# Patient Record
Sex: Female | Born: 1970
Health system: Southern US, Community
[De-identification: ages and names within clinical notes are randomized; demographics above are authoritative.]

## PROBLEM LIST (undated history)

## (undated) DIAGNOSIS — M1990 Unspecified osteoarthritis, unspecified site: Secondary | ICD-10-CM

## (undated) DIAGNOSIS — B009 Herpesviral infection, unspecified: Secondary | ICD-10-CM

## (undated) DIAGNOSIS — N921 Excessive and frequent menstruation with irregular cycle: Secondary | ICD-10-CM

## (undated) DIAGNOSIS — B9689 Other specified bacterial agents as the cause of diseases classified elsewhere: Secondary | ICD-10-CM

## (undated) DIAGNOSIS — M75100 Unspecified rotator cuff tear or rupture of unspecified shoulder, not specified as traumatic: Secondary | ICD-10-CM

## (undated) DIAGNOSIS — G479 Sleep disorder, unspecified: Secondary | ICD-10-CM

## (undated) DIAGNOSIS — E049 Nontoxic goiter, unspecified: Secondary | ICD-10-CM

## (undated) DIAGNOSIS — Q019 Encephalocele, unspecified: Secondary | ICD-10-CM

## (undated) DIAGNOSIS — M549 Dorsalgia, unspecified: Secondary | ICD-10-CM

## (undated) DIAGNOSIS — M069 Rheumatoid arthritis, unspecified: Secondary | ICD-10-CM

## (undated) DIAGNOSIS — N898 Other specified noninflammatory disorders of vagina: Secondary | ICD-10-CM

## (undated) DIAGNOSIS — K5090 Crohn's disease, unspecified, without complications: Secondary | ICD-10-CM

## (undated) DIAGNOSIS — R350 Frequency of micturition: Secondary | ICD-10-CM

## (undated) DIAGNOSIS — E669 Obesity, unspecified: Secondary | ICD-10-CM

## (undated) DIAGNOSIS — E041 Nontoxic single thyroid nodule: Secondary | ICD-10-CM

## (undated) DIAGNOSIS — N760 Acute vaginitis: Secondary | ICD-10-CM

## (undated) DIAGNOSIS — Z309 Encounter for contraceptive management, unspecified: Secondary | ICD-10-CM

## (undated) DIAGNOSIS — I10 Essential (primary) hypertension: Secondary | ICD-10-CM

## (undated) HISTORY — DX: Acute vaginitis: N76.0

## (undated) HISTORY — DX: Sleep disorder, unspecified: G47.9

## (undated) HISTORY — DX: Essential (primary) hypertension: I10

## (undated) HISTORY — PX: TUBAL LIGATION: SHX77

## (undated) HISTORY — DX: Obesity, unspecified: E66.9

## (undated) HISTORY — DX: Excessive and frequent menstruation with irregular cycle: N92.1

## (undated) HISTORY — PX: WISDOM TOOTH EXTRACTION: SHX21

## (undated) HISTORY — DX: Rheumatoid arthritis, unspecified: M06.9

## (undated) HISTORY — DX: Unspecified osteoarthritis, unspecified site: M19.90

## (undated) HISTORY — DX: Herpesviral infection, unspecified: B00.9

## (undated) HISTORY — DX: Nontoxic single thyroid nodule: E04.1

## (undated) HISTORY — DX: Nontoxic goiter, unspecified: E04.9

## (undated) HISTORY — DX: Crohn's disease, unspecified, without complications: K50.90

## (undated) HISTORY — DX: Other specified bacterial agents as the cause of diseases classified elsewhere: B96.89

## (undated) HISTORY — DX: Dorsalgia, unspecified: M54.9

## (undated) HISTORY — DX: Frequency of micturition: R35.0

## (undated) HISTORY — DX: Encephalocele, unspecified: Q01.9

## (undated) HISTORY — DX: Unspecified rotator cuff tear or rupture of unspecified shoulder, not specified as traumatic: M75.100

## (undated) HISTORY — DX: Encounter for contraceptive management, unspecified: Z30.9

## (undated) HISTORY — DX: Other specified noninflammatory disorders of vagina: N89.8

## (undated) HISTORY — PX: RETAINED PLACENTA REMOVAL: SHX2336

---

## 1992-07-07 DIAGNOSIS — Q019 Encephalocele, unspecified: Secondary | ICD-10-CM

## 1992-07-07 HISTORY — PX: OTHER SURGICAL HISTORY: SHX169

## 1992-07-07 HISTORY — DX: Encephalocele, unspecified: Q01.9

## 1999-07-08 HISTORY — PX: OTHER SURGICAL HISTORY: SHX169

## 2001-01-20 ENCOUNTER — Encounter: Payer: Self-pay | Admitting: Family Medicine

## 2001-01-20 ENCOUNTER — Ambulatory Visit (HOSPITAL_COMMUNITY): Admission: RE | Admit: 2001-01-20 | Discharge: 2001-01-20 | Payer: Self-pay | Admitting: Family Medicine

## 2001-02-22 ENCOUNTER — Other Ambulatory Visit: Admission: RE | Admit: 2001-02-22 | Discharge: 2001-02-22 | Payer: Self-pay | Admitting: Obstetrics and Gynecology

## 2001-03-10 ENCOUNTER — Emergency Department (HOSPITAL_COMMUNITY): Admission: EM | Admit: 2001-03-10 | Discharge: 2001-03-10 | Payer: Self-pay | Admitting: Internal Medicine

## 2001-03-29 ENCOUNTER — Ambulatory Visit (HOSPITAL_COMMUNITY): Admission: RE | Admit: 2001-03-29 | Discharge: 2001-03-29 | Payer: Self-pay | Admitting: Obstetrics and Gynecology

## 2001-03-29 ENCOUNTER — Encounter: Payer: Self-pay | Admitting: Obstetrics and Gynecology

## 2001-04-23 ENCOUNTER — Observation Stay (HOSPITAL_COMMUNITY): Admission: RE | Admit: 2001-04-23 | Discharge: 2001-04-23 | Payer: Self-pay | Admitting: Obstetrics and Gynecology

## 2001-07-08 ENCOUNTER — Ambulatory Visit (HOSPITAL_COMMUNITY): Admission: RE | Admit: 2001-07-08 | Discharge: 2001-07-08 | Payer: Self-pay | Admitting: Obstetrics and Gynecology

## 2001-07-26 ENCOUNTER — Observation Stay (HOSPITAL_COMMUNITY): Admission: RE | Admit: 2001-07-26 | Discharge: 2001-07-27 | Payer: Self-pay | Admitting: Obstetrics and Gynecology

## 2001-08-04 ENCOUNTER — Inpatient Hospital Stay (HOSPITAL_COMMUNITY): Admission: RE | Admit: 2001-08-04 | Discharge: 2001-08-06 | Payer: Self-pay | Admitting: Obstetrics and Gynecology

## 2001-12-15 ENCOUNTER — Ambulatory Visit (HOSPITAL_COMMUNITY): Admission: RE | Admit: 2001-12-15 | Discharge: 2001-12-15 | Payer: Self-pay | Admitting: Family Medicine

## 2001-12-15 ENCOUNTER — Encounter: Payer: Self-pay | Admitting: Family Medicine

## 2001-12-24 ENCOUNTER — Ambulatory Visit (HOSPITAL_COMMUNITY): Admission: RE | Admit: 2001-12-24 | Discharge: 2001-12-24 | Payer: Self-pay | Admitting: Family Medicine

## 2001-12-24 ENCOUNTER — Encounter: Payer: Self-pay | Admitting: Family Medicine

## 2002-03-20 ENCOUNTER — Emergency Department (HOSPITAL_COMMUNITY): Admission: EM | Admit: 2002-03-20 | Discharge: 2002-03-20 | Payer: Self-pay | Admitting: *Deleted

## 2002-03-28 ENCOUNTER — Emergency Department (HOSPITAL_COMMUNITY): Admission: EM | Admit: 2002-03-28 | Discharge: 2002-03-28 | Payer: Self-pay | Admitting: Emergency Medicine

## 2002-03-28 ENCOUNTER — Encounter: Payer: Self-pay | Admitting: Emergency Medicine

## 2002-05-21 ENCOUNTER — Emergency Department (HOSPITAL_COMMUNITY): Admission: EM | Admit: 2002-05-21 | Discharge: 2002-05-21 | Payer: Self-pay | Admitting: Emergency Medicine

## 2002-05-21 ENCOUNTER — Encounter: Payer: Self-pay | Admitting: Emergency Medicine

## 2002-12-16 ENCOUNTER — Encounter: Payer: Self-pay | Admitting: Emergency Medicine

## 2002-12-16 ENCOUNTER — Emergency Department (HOSPITAL_COMMUNITY): Admission: EM | Admit: 2002-12-16 | Discharge: 2002-12-16 | Payer: Self-pay | Admitting: Emergency Medicine

## 2003-03-29 ENCOUNTER — Ambulatory Visit (HOSPITAL_COMMUNITY): Admission: RE | Admit: 2003-03-29 | Discharge: 2003-03-29 | Payer: Self-pay | Admitting: Family Medicine

## 2003-03-29 ENCOUNTER — Encounter: Payer: Self-pay | Admitting: Family Medicine

## 2003-03-29 ENCOUNTER — Encounter: Payer: Self-pay | Admitting: Obstetrics and Gynecology

## 2003-03-29 ENCOUNTER — Ambulatory Visit (HOSPITAL_COMMUNITY): Admission: RE | Admit: 2003-03-29 | Discharge: 2003-03-29 | Payer: Self-pay | Admitting: Obstetrics and Gynecology

## 2004-01-16 ENCOUNTER — Emergency Department (HOSPITAL_COMMUNITY): Admission: EM | Admit: 2004-01-16 | Discharge: 2004-01-16 | Payer: Self-pay | Admitting: Emergency Medicine

## 2004-01-25 ENCOUNTER — Ambulatory Visit (HOSPITAL_COMMUNITY): Admission: RE | Admit: 2004-01-25 | Discharge: 2004-01-25 | Payer: Self-pay | Admitting: Family Medicine

## 2004-05-01 ENCOUNTER — Emergency Department (HOSPITAL_COMMUNITY): Admission: EM | Admit: 2004-05-01 | Discharge: 2004-05-02 | Payer: Self-pay | Admitting: *Deleted

## 2004-05-02 ENCOUNTER — Ambulatory Visit (HOSPITAL_COMMUNITY): Admission: RE | Admit: 2004-05-02 | Discharge: 2004-05-02 | Payer: Self-pay | Admitting: Obstetrics & Gynecology

## 2004-05-21 ENCOUNTER — Emergency Department (HOSPITAL_COMMUNITY): Admission: EM | Admit: 2004-05-21 | Discharge: 2004-05-21 | Payer: Self-pay | Admitting: Emergency Medicine

## 2004-05-22 ENCOUNTER — Ambulatory Visit (HOSPITAL_COMMUNITY): Admission: RE | Admit: 2004-05-22 | Discharge: 2004-05-22 | Payer: Self-pay | Admitting: Family Medicine

## 2004-05-27 ENCOUNTER — Ambulatory Visit (HOSPITAL_COMMUNITY): Admission: RE | Admit: 2004-05-27 | Discharge: 2004-05-27 | Payer: Self-pay | Admitting: Family Medicine

## 2004-09-24 ENCOUNTER — Ambulatory Visit: Payer: Self-pay | Admitting: Family Medicine

## 2004-10-18 ENCOUNTER — Ambulatory Visit (HOSPITAL_COMMUNITY): Admission: RE | Admit: 2004-10-18 | Discharge: 2004-10-18 | Payer: Self-pay | Admitting: Family Medicine

## 2004-12-25 ENCOUNTER — Ambulatory Visit (HOSPITAL_COMMUNITY): Admission: RE | Admit: 2004-12-25 | Discharge: 2004-12-25 | Payer: Self-pay | Admitting: Family Medicine

## 2004-12-25 ENCOUNTER — Ambulatory Visit: Payer: Self-pay | Admitting: Family Medicine

## 2005-01-29 ENCOUNTER — Ambulatory Visit (HOSPITAL_COMMUNITY): Admission: RE | Admit: 2005-01-29 | Discharge: 2005-01-29 | Payer: Self-pay | Admitting: Obstetrics and Gynecology

## 2005-04-14 ENCOUNTER — Ambulatory Visit (HOSPITAL_COMMUNITY): Admission: RE | Admit: 2005-04-14 | Discharge: 2005-04-14 | Payer: Self-pay | Admitting: Obstetrics & Gynecology

## 2005-05-07 ENCOUNTER — Ambulatory Visit: Payer: Self-pay | Admitting: Family Medicine

## 2005-05-30 ENCOUNTER — Ambulatory Visit (HOSPITAL_COMMUNITY): Admission: RE | Admit: 2005-05-30 | Discharge: 2005-05-30 | Payer: Self-pay | Admitting: Family Medicine

## 2005-07-07 HISTORY — PX: BREAST BIOPSY: SHX20

## 2005-07-15 ENCOUNTER — Ambulatory Visit (HOSPITAL_COMMUNITY): Admission: AD | Admit: 2005-07-15 | Discharge: 2005-07-15 | Payer: Self-pay | Admitting: Obstetrics and Gynecology

## 2005-07-28 ENCOUNTER — Observation Stay (HOSPITAL_COMMUNITY): Admission: AD | Admit: 2005-07-28 | Discharge: 2005-07-29 | Payer: Self-pay | Admitting: Obstetrics and Gynecology

## 2005-08-14 ENCOUNTER — Observation Stay (HOSPITAL_COMMUNITY): Admission: RE | Admit: 2005-08-14 | Discharge: 2005-08-15 | Payer: Self-pay | Admitting: Obstetrics and Gynecology

## 2005-11-17 ENCOUNTER — Ambulatory Visit (HOSPITAL_COMMUNITY): Admission: AD | Admit: 2005-11-17 | Discharge: 2005-11-17 | Payer: Self-pay | Admitting: Obstetrics and Gynecology

## 2005-12-07 ENCOUNTER — Ambulatory Visit: Payer: Self-pay | Admitting: Neonatology

## 2005-12-07 ENCOUNTER — Inpatient Hospital Stay (HOSPITAL_COMMUNITY): Admission: AD | Admit: 2005-12-07 | Discharge: 2005-12-15 | Payer: Self-pay | Admitting: Gynecology

## 2005-12-07 ENCOUNTER — Ambulatory Visit: Payer: Self-pay | Admitting: Family Medicine

## 2005-12-13 ENCOUNTER — Encounter (INDEPENDENT_AMBULATORY_CARE_PROVIDER_SITE_OTHER): Payer: Self-pay | Admitting: *Deleted

## 2006-06-18 ENCOUNTER — Ambulatory Visit: Payer: Self-pay | Admitting: Family Medicine

## 2006-08-25 ENCOUNTER — Ambulatory Visit (HOSPITAL_COMMUNITY): Admission: RE | Admit: 2006-08-25 | Discharge: 2006-08-25 | Payer: Self-pay | Admitting: Family Medicine

## 2006-08-25 ENCOUNTER — Encounter: Payer: Self-pay | Admitting: Family Medicine

## 2006-08-25 LAB — CONVERTED CEMR LAB
Alkaline Phosphatase: 104 units/L (ref 39–117)
Basophils Relative: 1 % (ref 0–1)
CO2: 25 meq/L (ref 19–32)
Calcium: 9.3 mg/dL (ref 8.4–10.5)
Chloride: 105 meq/L (ref 96–112)
Cholesterol: 133 mg/dL (ref 0–200)
Creatinine, Ser: 0.92 mg/dL (ref 0.40–1.20)
Eosinophils Relative: 2 % (ref 0–5)
Glucose, Bld: 78 mg/dL (ref 70–99)
HCT: 41.6 % (ref 36.0–46.0)
Indirect Bilirubin: 0.4 mg/dL (ref 0.0–0.9)
LDL Cholesterol: 80 mg/dL (ref 0–99)
Lymphs Abs: 1.6 10*3/uL (ref 0.7–3.3)
MCV: 69.7 fL — ABNORMAL LOW (ref 78.0–100.0)
Platelets: 322 10*3/uL (ref 150–400)
Potassium: 3.2 meq/L — ABNORMAL LOW (ref 3.5–5.3)
RBC: 5.97 M/uL — ABNORMAL HIGH (ref 3.87–5.11)
RDW: 15 % — ABNORMAL HIGH (ref 11.5–14.0)
TSH: 1.193 microintl units/mL (ref 0.350–5.50)
Triglycerides: 81 mg/dL (ref <150)
WBC: 8.5 10*3/uL (ref 4.0–10.5)

## 2006-12-15 ENCOUNTER — Ambulatory Visit: Payer: Self-pay | Admitting: Family Medicine

## 2006-12-15 LAB — CONVERTED CEMR LAB
CO2: 25 meq/L (ref 19–32)
Calcium: 8.8 mg/dL (ref 8.4–10.5)
Creatinine, Ser: 0.88 mg/dL (ref 0.40–1.20)
Sodium: 141 meq/L (ref 135–145)

## 2006-12-16 ENCOUNTER — Encounter: Payer: Self-pay | Admitting: Family Medicine

## 2006-12-16 LAB — CONVERTED CEMR LAB: GC Probe Amp, Urine: NEGATIVE

## 2007-03-15 ENCOUNTER — Other Ambulatory Visit: Admission: RE | Admit: 2007-03-15 | Discharge: 2007-03-15 | Payer: Self-pay | Admitting: Obstetrics and Gynecology

## 2007-04-05 ENCOUNTER — Ambulatory Visit: Payer: Self-pay | Admitting: Family Medicine

## 2007-04-05 LAB — CONVERTED CEMR LAB
CO2: 28 meq/L (ref 19–32)
Chloride: 104 meq/L (ref 96–112)
Creatinine, Ser: 0.92 mg/dL (ref 0.40–1.20)
Glucose, Bld: 86 mg/dL (ref 70–99)
Sodium: 138 meq/L (ref 135–145)

## 2007-07-21 ENCOUNTER — Ambulatory Visit: Payer: Self-pay | Admitting: Family Medicine

## 2007-07-21 LAB — CONVERTED CEMR LAB
AST: 17 units/L (ref 0–37)
Albumin: 4.4 g/dL (ref 3.5–5.2)
Alkaline Phosphatase: 119 units/L — ABNORMAL HIGH (ref 39–117)
Basophils Absolute: 0 10*3/uL (ref 0.0–0.1)
Bilirubin Urine: NEGATIVE
Cholesterol: 125 mg/dL (ref 0–200)
Creatinine, Ser: 0.86 mg/dL (ref 0.40–1.20)
Glucose, Bld: 90 mg/dL (ref 70–99)
HDL: 35 mg/dL — ABNORMAL LOW (ref >39)
Leukocytes, UA: NEGATIVE
Lymphs Abs: 1.4 10*3/uL (ref 0.7–4.0)
MCV: 68.7 fL — ABNORMAL LOW (ref 78.0–100.0)
Monocytes Relative: 8 % (ref 3–12)
Neutro Abs: 5.7 10*3/uL (ref 1.7–7.7)
Neutrophils Relative %: 73 % (ref 43–77)
Nitrite: NEGATIVE
Platelets: 276 10*3/uL (ref 150–400)
Protein, ur: NEGATIVE mg/dL
RBC: 5.97 M/uL — ABNORMAL HIGH (ref 3.87–5.11)
RDW: 15.4 % (ref 11.5–15.5)
Specific Gravity, Urine: 1.022 (ref 1.005–1.03)
TSH: 1.706 microintl units/mL (ref 0.350–5.50)
Urine Glucose: NEGATIVE mg/dL
VLDL: 14 mg/dL (ref 0–40)
WBC: 7.8 10*3/uL (ref 4.0–10.5)

## 2007-08-05 ENCOUNTER — Ambulatory Visit (HOSPITAL_COMMUNITY): Admission: RE | Admit: 2007-08-05 | Discharge: 2007-08-05 | Payer: Self-pay | Admitting: Family Medicine

## 2007-10-26 DIAGNOSIS — M549 Dorsalgia, unspecified: Secondary | ICD-10-CM | POA: Insufficient documentation

## 2007-10-26 DIAGNOSIS — I10 Essential (primary) hypertension: Secondary | ICD-10-CM | POA: Insufficient documentation

## 2007-10-26 DIAGNOSIS — R079 Chest pain, unspecified: Secondary | ICD-10-CM | POA: Insufficient documentation

## 2007-10-26 DIAGNOSIS — G479 Sleep disorder, unspecified: Secondary | ICD-10-CM | POA: Insufficient documentation

## 2007-10-26 DIAGNOSIS — E041 Nontoxic single thyroid nodule: Secondary | ICD-10-CM | POA: Insufficient documentation

## 2007-11-01 ENCOUNTER — Ambulatory Visit (HOSPITAL_COMMUNITY): Admission: RE | Admit: 2007-11-01 | Discharge: 2007-11-01 | Payer: Self-pay | Admitting: Family Medicine

## 2008-01-17 ENCOUNTER — Ambulatory Visit: Payer: Self-pay | Admitting: Family Medicine

## 2008-02-05 ENCOUNTER — Emergency Department (HOSPITAL_COMMUNITY): Admission: EM | Admit: 2008-02-05 | Discharge: 2008-02-06 | Payer: Self-pay | Admitting: Emergency Medicine

## 2008-03-14 ENCOUNTER — Ambulatory Visit: Payer: Self-pay | Admitting: Family Medicine

## 2008-03-14 ENCOUNTER — Telehealth: Payer: Self-pay | Admitting: Family Medicine

## 2008-03-14 DIAGNOSIS — H65499 Other chronic nonsuppurative otitis media, unspecified ear: Secondary | ICD-10-CM | POA: Insufficient documentation

## 2008-03-14 DIAGNOSIS — N3000 Acute cystitis without hematuria: Secondary | ICD-10-CM | POA: Insufficient documentation

## 2008-03-14 DIAGNOSIS — R112 Nausea with vomiting, unspecified: Secondary | ICD-10-CM | POA: Insufficient documentation

## 2008-03-14 DIAGNOSIS — K5289 Other specified noninfective gastroenteritis and colitis: Secondary | ICD-10-CM | POA: Insufficient documentation

## 2008-03-14 DIAGNOSIS — E876 Hypokalemia: Secondary | ICD-10-CM | POA: Insufficient documentation

## 2008-03-14 LAB — CONVERTED CEMR LAB
BUN: 13 mg/dL (ref 6–23)
Creatinine, Ser: 0.92 mg/dL (ref 0.40–1.20)
Glucose, Bld: 87 mg/dL (ref 70–99)
Protein, U semiquant: 30
Sodium: 142 meq/L (ref 135–145)
Urobilinogen, UA: 0.2
pH: 6

## 2008-03-16 ENCOUNTER — Telehealth: Payer: Self-pay | Admitting: Family Medicine

## 2008-03-16 ENCOUNTER — Encounter: Payer: Self-pay | Admitting: Family Medicine

## 2008-03-28 ENCOUNTER — Encounter: Payer: Self-pay | Admitting: Family Medicine

## 2008-04-03 ENCOUNTER — Telehealth: Payer: Self-pay | Admitting: Family Medicine

## 2008-04-04 ENCOUNTER — Other Ambulatory Visit: Admission: RE | Admit: 2008-04-04 | Discharge: 2008-04-04 | Payer: Self-pay | Admitting: Obstetrics and Gynecology

## 2008-04-11 ENCOUNTER — Ambulatory Visit (HOSPITAL_COMMUNITY): Admission: RE | Admit: 2008-04-11 | Discharge: 2008-04-11 | Payer: Self-pay | Admitting: Obstetrics & Gynecology

## 2008-06-07 ENCOUNTER — Telehealth: Payer: Self-pay | Admitting: Family Medicine

## 2008-06-15 ENCOUNTER — Telehealth: Payer: Self-pay | Admitting: Family Medicine

## 2008-06-23 ENCOUNTER — Telehealth: Payer: Self-pay | Admitting: Family Medicine

## 2008-07-25 ENCOUNTER — Telehealth: Payer: Self-pay | Admitting: Family Medicine

## 2008-08-07 ENCOUNTER — Encounter: Payer: Self-pay | Admitting: Family Medicine

## 2008-08-31 ENCOUNTER — Telehealth: Payer: Self-pay | Admitting: Family Medicine

## 2008-09-04 ENCOUNTER — Encounter: Payer: Self-pay | Admitting: Family Medicine

## 2008-09-05 ENCOUNTER — Ambulatory Visit: Payer: Self-pay | Admitting: Family Medicine

## 2008-09-05 DIAGNOSIS — J0110 Acute frontal sinusitis, unspecified: Secondary | ICD-10-CM | POA: Insufficient documentation

## 2008-09-05 LAB — CONVERTED CEMR LAB
BUN: 10 mg/dL (ref 6–23)
Creatinine, Ser: 0.91 mg/dL (ref 0.40–1.20)
Glucose, Bld: 85 mg/dL (ref 70–99)
Potassium: 3.2 meq/L — ABNORMAL LOW (ref 3.5–5.3)
Sodium: 141 meq/L (ref 135–145)

## 2008-10-09 ENCOUNTER — Encounter: Payer: Self-pay | Admitting: Family Medicine

## 2008-11-14 ENCOUNTER — Encounter: Payer: Self-pay | Admitting: Family Medicine

## 2008-11-15 ENCOUNTER — Encounter (INDEPENDENT_AMBULATORY_CARE_PROVIDER_SITE_OTHER): Payer: Self-pay

## 2008-11-15 ENCOUNTER — Ambulatory Visit: Payer: Self-pay | Admitting: Family Medicine

## 2008-11-15 DIAGNOSIS — E663 Overweight: Secondary | ICD-10-CM | POA: Insufficient documentation

## 2008-11-15 DIAGNOSIS — I498 Other specified cardiac arrhythmias: Secondary | ICD-10-CM | POA: Insufficient documentation

## 2008-11-17 ENCOUNTER — Ambulatory Visit (HOSPITAL_COMMUNITY): Admission: RE | Admit: 2008-11-17 | Discharge: 2008-11-17 | Payer: Self-pay | Admitting: Family Medicine

## 2008-11-22 ENCOUNTER — Telehealth: Payer: Self-pay | Admitting: Family Medicine

## 2009-01-04 ENCOUNTER — Ambulatory Visit: Payer: Self-pay | Admitting: Family Medicine

## 2009-01-04 LAB — CONVERTED CEMR LAB
Bilirubin Urine: NEGATIVE
Glucose, Urine, Semiquant: NEGATIVE
Ketones, urine, test strip: NEGATIVE
Nitrite: NEGATIVE
WBC Urine, dipstick: NEGATIVE

## 2009-02-28 ENCOUNTER — Encounter (INDEPENDENT_AMBULATORY_CARE_PROVIDER_SITE_OTHER): Payer: Self-pay

## 2009-02-28 ENCOUNTER — Ambulatory Visit: Payer: Self-pay | Admitting: Family Medicine

## 2009-02-28 DIAGNOSIS — R5381 Other malaise: Secondary | ICD-10-CM | POA: Insufficient documentation

## 2009-02-28 DIAGNOSIS — R5383 Other fatigue: Secondary | ICD-10-CM

## 2009-03-01 DIAGNOSIS — R0989 Other specified symptoms and signs involving the circulatory and respiratory systems: Secondary | ICD-10-CM | POA: Insufficient documentation

## 2009-03-01 DIAGNOSIS — G44229 Chronic tension-type headache, not intractable: Secondary | ICD-10-CM | POA: Insufficient documentation

## 2009-03-01 LAB — CONVERTED CEMR LAB
BUN: 10 mg/dL (ref 6–23)
Basophils Absolute: 0.1 10*3/uL (ref 0.0–0.1)
CO2: 28 meq/L (ref 19–32)
Chloride: 102 meq/L (ref 96–112)
Eosinophils Absolute: 0.2 10*3/uL (ref 0.0–0.7)
Eosinophils Relative: 2 % (ref 0–5)
Glucose, Bld: 81 mg/dL (ref 70–99)
HCT: 40 % (ref 36.0–46.0)
LDL Cholesterol: 82 mg/dL (ref 0–99)
Monocytes Absolute: 0.6 10*3/uL (ref 0.1–1.0)
Monocytes Relative: 7 % (ref 3–12)
Neutrophils Relative %: 70 % (ref 43–77)
Sodium: 142 meq/L (ref 135–145)
Total CHOL/HDL Ratio: 3.7
VLDL: 11 mg/dL (ref 0–40)
WBC: 8.6 10*3/uL (ref 4.0–10.5)

## 2009-03-17 DIAGNOSIS — M542 Cervicalgia: Secondary | ICD-10-CM | POA: Insufficient documentation

## 2009-03-22 ENCOUNTER — Telehealth: Payer: Self-pay | Admitting: Family Medicine

## 2009-03-22 ENCOUNTER — Ambulatory Visit: Payer: Self-pay | Admitting: Family Medicine

## 2009-03-27 ENCOUNTER — Encounter: Payer: Self-pay | Admitting: Family Medicine

## 2009-03-27 ENCOUNTER — Telehealth: Payer: Self-pay | Admitting: Family Medicine

## 2009-03-28 ENCOUNTER — Encounter (INDEPENDENT_AMBULATORY_CARE_PROVIDER_SITE_OTHER): Payer: Self-pay

## 2009-03-28 DIAGNOSIS — F329 Major depressive disorder, single episode, unspecified: Secondary | ICD-10-CM

## 2009-03-28 DIAGNOSIS — F3289 Other specified depressive episodes: Secondary | ICD-10-CM | POA: Insufficient documentation

## 2009-03-29 ENCOUNTER — Encounter: Payer: Self-pay | Admitting: Family Medicine

## 2009-04-02 ENCOUNTER — Telehealth: Payer: Self-pay | Admitting: Family Medicine

## 2009-04-04 ENCOUNTER — Telehealth: Payer: Self-pay | Admitting: Family Medicine

## 2009-04-09 ENCOUNTER — Telehealth: Payer: Self-pay | Admitting: Family Medicine

## 2009-04-17 ENCOUNTER — Telehealth: Payer: Self-pay | Admitting: Family Medicine

## 2009-04-18 ENCOUNTER — Telehealth: Payer: Self-pay | Admitting: Family Medicine

## 2009-04-18 ENCOUNTER — Encounter: Payer: Self-pay | Admitting: Family Medicine

## 2009-05-16 ENCOUNTER — Telehealth: Payer: Self-pay | Admitting: Family Medicine

## 2009-06-13 ENCOUNTER — Encounter: Payer: Self-pay | Admitting: Family Medicine

## 2009-06-20 ENCOUNTER — Telehealth: Payer: Self-pay | Admitting: Family Medicine

## 2009-07-19 ENCOUNTER — Other Ambulatory Visit: Admission: RE | Admit: 2009-07-19 | Discharge: 2009-07-19 | Payer: Self-pay | Admitting: Obstetrics and Gynecology

## 2009-07-19 ENCOUNTER — Ambulatory Visit: Payer: Self-pay | Admitting: Family Medicine

## 2009-07-20 ENCOUNTER — Telehealth: Payer: Self-pay | Admitting: Family Medicine

## 2009-07-22 DIAGNOSIS — B354 Tinea corporis: Secondary | ICD-10-CM | POA: Insufficient documentation

## 2009-07-22 DIAGNOSIS — J0100 Acute maxillary sinusitis, unspecified: Secondary | ICD-10-CM | POA: Insufficient documentation

## 2009-10-15 ENCOUNTER — Telehealth: Payer: Self-pay | Admitting: Family Medicine

## 2009-10-16 ENCOUNTER — Ambulatory Visit: Payer: Self-pay | Admitting: Family Medicine

## 2009-10-16 DIAGNOSIS — J309 Allergic rhinitis, unspecified: Secondary | ICD-10-CM | POA: Insufficient documentation

## 2009-11-18 ENCOUNTER — Emergency Department (HOSPITAL_COMMUNITY): Admission: EM | Admit: 2009-11-18 | Discharge: 2009-11-18 | Payer: Self-pay | Admitting: Emergency Medicine

## 2009-11-28 ENCOUNTER — Telehealth: Payer: Self-pay | Admitting: Family Medicine

## 2009-12-11 ENCOUNTER — Telehealth: Payer: Self-pay | Admitting: Family Medicine

## 2009-12-14 ENCOUNTER — Telehealth: Payer: Self-pay | Admitting: Family Medicine

## 2009-12-26 ENCOUNTER — Telehealth (INDEPENDENT_AMBULATORY_CARE_PROVIDER_SITE_OTHER): Payer: Self-pay | Admitting: *Deleted

## 2010-01-22 ENCOUNTER — Ambulatory Visit (HOSPITAL_COMMUNITY): Admission: RE | Admit: 2010-01-22 | Discharge: 2010-01-22 | Payer: Self-pay | Admitting: Family Medicine

## 2010-01-23 ENCOUNTER — Telehealth: Payer: Self-pay | Admitting: Family Medicine

## 2010-01-29 ENCOUNTER — Telehealth: Payer: Self-pay | Admitting: Family Medicine

## 2010-03-12 ENCOUNTER — Telehealth: Payer: Self-pay | Admitting: Family Medicine

## 2010-03-19 ENCOUNTER — Ambulatory Visit: Payer: Self-pay | Admitting: Family Medicine

## 2010-04-17 ENCOUNTER — Telehealth: Payer: Self-pay | Admitting: Family Medicine

## 2010-05-28 ENCOUNTER — Telehealth: Payer: Self-pay | Admitting: Family Medicine

## 2010-06-07 ENCOUNTER — Telehealth: Payer: Self-pay | Admitting: Family Medicine

## 2010-07-01 ENCOUNTER — Emergency Department (HOSPITAL_COMMUNITY)
Admission: EM | Admit: 2010-07-01 | Discharge: 2010-07-01 | Payer: Self-pay | Source: Home / Self Care | Admitting: Emergency Medicine

## 2010-07-25 ENCOUNTER — Encounter: Payer: Self-pay | Admitting: Family Medicine

## 2010-07-25 ENCOUNTER — Ambulatory Visit
Admission: RE | Admit: 2010-07-25 | Discharge: 2010-07-25 | Payer: Self-pay | Source: Home / Self Care | Attending: Family Medicine | Admitting: Family Medicine

## 2010-07-25 DIAGNOSIS — J040 Acute laryngitis: Secondary | ICD-10-CM | POA: Insufficient documentation

## 2010-07-25 DIAGNOSIS — J209 Acute bronchitis, unspecified: Secondary | ICD-10-CM | POA: Insufficient documentation

## 2010-07-27 ENCOUNTER — Encounter: Payer: Self-pay | Admitting: Family Medicine

## 2010-07-28 ENCOUNTER — Encounter: Payer: Self-pay | Admitting: Family Medicine

## 2010-07-29 ENCOUNTER — Encounter: Payer: Self-pay | Admitting: Family Medicine

## 2010-07-29 LAB — CONVERTED CEMR LAB
AST: 17 units/L (ref 0–37)
Bilirubin, Direct: 0.1 mg/dL (ref 0.0–0.3)
Glucose, Bld: 82 mg/dL (ref 70–99)
Hgb A1c MFr Bld: 5.7 % — ABNORMAL HIGH (ref <5.7)
Indirect Bilirubin: 0.4 mg/dL (ref 0.0–0.9)
Total Bilirubin: 0.5 mg/dL (ref 0.3–1.2)
Total Protein: 7.7 g/dL (ref 6.0–8.3)
Triglycerides: 56 mg/dL (ref <150)

## 2010-07-30 ENCOUNTER — Encounter: Payer: Self-pay | Admitting: Family Medicine

## 2010-08-06 NOTE — Progress Notes (Signed)
Summary: depo shot  Phone Note Call from Patient   Summary of Call: patient called in and asked for her depo shot, she states that she normally gets from Dr Rayna Sexton office.  I asked [name] and she said that is where she needed to go and get it, since they had records of her last shot, and that is where she has been getting them.  I advised [name] of the advise from [facility], and she said " that is alright I will call Dr Lodema Hong tonight". Initial call taken by: Curtis Sites,  May 28, 2010 8:39 AM  Follow-up for Phone Call        pt not established here fordepo , nededs to go to gynae Follow-up by: Syliva Overman MD,  May 29, 2010 6:50 AM

## 2010-08-06 NOTE — Progress Notes (Signed)
Summary: swollen eyelid  Phone Note Call from Patient   Summary of Call: pt eyelid is swollen and sore. she didn't go to work. [phone] Initial call taken by: Rudene Anda,  October 15, 2009 1:47 PM  Follow-up for Phone Call        offered appt in the morning, patient declined stating she had to work and states she will continue warm compresses Follow-up by: Adella Hare LPN,  October 15, 2009 4:03 PM

## 2010-08-06 NOTE — Progress Notes (Signed)
Summary: call  Phone Note Call from Patient   Summary of Call: went to the dentist and he gave her 2 different meds. and want to see what effect they would have on her with her other meds call back at [phone] Initial call taken by: Lind Guest,  December 14, 2009 11:18 AM  Follow-up for Phone Call        pls advise her to just take the med prescribed by the dentist since he/she is the one treating the tooth, any more specific concerns aboutdrug interaqctions the pharmacist can provide Follow-up by: Syliva Overman MD,  December 14, 2009 12:52 PM  Additional Follow-up for Phone Call Additional follow up Details #1::        Patient aware Additional Follow-up by: Everitt Amber LPN,  December 14, 2009 3:25 PM

## 2010-08-06 NOTE — Progress Notes (Signed)
Summary: APPT  Phone Note Call from Patient   Summary of Call: NEEDS APPT FOR  DR. MORAYATI LAST APPT OF THE DAY  AND NEEDS HER MAMMO PLEASE CALL BACK AT [phone] Initial call taken by: Lind Guest,  December 26, 2009 10:37 AM  Follow-up for Phone Call        pt has apppt with dr. Talmage Nap for 02/18/2010 2:00. left message for her to call office. pt notified  Follow-up by: Rudene Anda,  December 27, 2009 8:39 AM

## 2010-08-06 NOTE — Assessment & Plan Note (Signed)
Summary: office visit   Vital Signs:  Patient profile:   40 year old female Menstrual status:  regular Height:      66.5 inches Weight:      173.25 pounds BMI:     27.64 O2 Sat:      98 % on Room air Pulse rate:   68 / minute Pulse rhythm:   regular Resp:     16 per minute BP sitting:   128 / 80  (left arm)  Vitals Entered By: Adella Hare LPN (March 19, 2010 8:18 AM)  Nutrition Counseling: Patient's BMI is greater than 25 and therefore counseled on weight management options.  O2 Flow:  Room air CC: follow-up visit Is Patient Diabetic? No Pain Assessment Patient in pain? no        Primary Care Chloey Ricard:  Syliva Overman MD  CC:  follow-up visit.  History of Present Illness: Reports  that she has been doing fairly well. 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 Her job is going well and she is doing an on [employer] course. Denies recent fever or chills. Denies sinus pressure, nasal congestion , ear pain or sore throat. Denies chest congestion, or cough productive of sputum. Denies chest pain, palpitations, PND, orthopnea or leg swelling. Denies abdominal pain, nausea, vomitting, diarrhea or constipation. Denies change in bowel movements or bloody stool. Denies dysuria , frequency, incontinence or hesitancy.  Denies depression, anxiety or insomnia. Denies  rash, lesions, or itch.     Current Medications (verified): 1)  Dilt-Cd 120 Mg Xr24h-Cap (Diltiazem Hcl Coated Beads) .... Take 1 Tablet By Mouth Once A Day 2)  Metoprolol Tartrate 25 Mg  Tabs (Metoprolol Tartrate) .... Take 1 Tablet By Mouth Two Times A Day 3)  Hydrochlorothiazide 25 Mg  Tabs (Hydrochlorothiazide) .... Take 1 Tablet By Mouth Once A Day 4)  Klor-Con M20 20 Meq Cr-Tabs (Potassium Chloride Crys  Cr) .... Take 1 Tablet By Mouth Three Times A Day 5)  Loratadine 10 Mg Tabs (Loratadine) .... Take 1 Daily As Needed For Allergies  Allergies (verified): 1)  ! Biaxin (Clarithromycin)  Review of Systems      See HPI General:  Complains of sleep disorder. Eyes:  Complains of vision loss-both eyes; has eye appt in January, glasses since age 89, astigmatism. ENT:  hasupcoming appt with endo regoiter. GU:  Complains of dysuria; to start antibiotics for uTI per rexcent gynae eval. MS:  Complains of joint pain and stiffness; neck and back pain , recently saw ortho in [place] who prescribed anti-inflammatory and muscle relaxant and has ordered multiple labs. Endo:  has upcoming apppt with endo next month, re thyroid nodule. Heme:  Denies abnormal bruising and bleeding. Allergy:  Complains of seasonal allergies; denies hives or rash and itching eyes.  Physical Exam  General:  Well-developed,well-nourished,in no acute distress; alert,appropriate and cooperative throughout examination HEENT: No facial asymmetry,  EOMI, No sinus tenderness, TM's Clear, oropharynx  pink and moist.   Chest: Clear to auscultation bilaterally.  CVS: S1, S2, No murmurs, No S3.   Abd: Soft, Nontender.  MS: Adequate ROM spine, hips, shoulders and knees.  Ext: No edema.   CNS: CN 2-12 intact, power tone and sensation normal throughout.   Skin: Intact, no visible lesions or rashes.  Psych: Good eye contact, normal affect.  Memory  intact, not anxious or depressed appearing.    Impression & Recommendations:  Problem # 1:  ALLERGIC RHINITIS (ICD-477.9) Assessment Unchanged  The following medications were removed from the medication list:    Loratadine 10 Mg Tabs (Loratadine) .Marland Kitchen... Take 1 daily as needed for allergies Her updated medication list for this problem includes:    Cetirizine Hcl 10 Mg Tabs (Cetirizine hcl) .Marland Kitchen... Take 1 tablet by mouth once a day  Problem # 2:  DEPRESSION (ICD-311) Assessment:  Improved  Problem # 3:  NECK PAIN (ICD-723.1) Assessment: Unchanged currently being evaluated by ortho  Problem # 4:  OVERWEIGHT (ICD-278.02) Assessment: Deteriorated  Ht: 66.5 (03/19/2010)   Wt: 173.25 (03/19/2010)   BMI: 27.64 (03/19/2010) therapeutic lifestyle change discussed and encouraged  Problem # 5:  HYPERTENSION (ICD-401.9) Assessment: Deteriorated  Her updated medication list for this problem includes:    Dilt-cd 120 Mg Xr24h-cap (Diltiazem hcl coated beads) .Marland Kitchen... Take 1 tablet by mouth once a day    Metoprolol Tartrate 25 Mg Tabs (Metoprolol tartrate) .Marland Kitchen... Take 1 tablet by mouth two times a day    Hydrochlorothiazide 25 Mg Tabs (Hydrochlorothiazide) .Marland Kitchen... Take 1 tablet by mouth once a day  Orders: T-Basic Metabolic Panel [phone])  BP today: 128/80 Prior BP: 100/62 (10/16/2009)  Labs Reviewed: K+: 3.3 (02/28/2009) Creat: : 0.93 (02/28/2009)   Chol: 128 (02/28/2009)   HDL: 35 (02/28/2009)   LDL: 82 (02/28/2009)   TG: 56 (02/28/2009)  Complete Medication List: 1)  Dilt-cd 120 Mg Xr24h-cap (Diltiazem hcl coated beads) .... Take 1 tablet by mouth once a day 2)  Metoprolol Tartrate 25 Mg Tabs (Metoprolol tartrate) .... Take 1 tablet by mouth two times a day 3)  Hydrochlorothiazide 25 Mg Tabs (Hydrochlorothiazide) .... Take 1 tablet by mouth once a day 4)  Klor-con M20 20 Meq Cr-tabs (Potassium chloride crys cr) .... Take 1 tablet by mouth three times a day 5)  Cetirizine Hcl 10 Mg Tabs (Cetirizine hcl) .... Take 1 tablet by mouth once a day  Other Orders: T-Lipid Profile ([phone]) T-Hepatic Function [phone]) T-CBC w/Diff [phone]) T-TSH [id number]) T- Hemoglobin A1C ([id number])  Patient Instructions: 1)  F/U in 5.5 months 2)  It is important that you exercise regularly at least 30 minutes 5 times a week. If you develop chest pain, have severe difficulty breathing, or feel very tired , stop exercising immediately and seek medical  attention. 3)  You need to lose weight. Consider a lower calorie diet and regular exercise.  4)  You are doing well. 5)  No med changes at this time 6)  BMP prior to visit, ICD-9: 7)  Hepatic Panel prior to visit, ICD-9: 8)  Lipid Panel prior to visit, ICD-9: 9)  TSH prior to visit, ICD-9:  fasting  in October 10)  CBC w/ Diff prior to visit, ICD-9: 11)  HbgA1C prior to visit, ICD-9: Prescriptions: KLOR-CON M20 20 MEQ CR-TABS (POTASSIUM CHLORIDE CRYS CR) Take 1 tablet by mouth three times a day  #90 x 3   Entered by:   Adella Hare LPN   Authorized by:   Syliva Overman MD   Signed by:   Adella Hare LPN on 31/51/7616   Method used:   Electronically to        [facility] [phone]* (retail)       [address]       [place], [place]  [address]       Ph: [phone]  or [phone]       Fax: [phone]   RxID:   [id number] METOPROLOL TARTRATE 25 MG  TABS (METOPROLOL TARTRATE) Take 1 tablet by mouth two times a day  #60 x 3   Entered by:   Adella Hare LPN   Authorized by:   Syliva Overman MD   Signed by:   Adella Hare LPN on 62/95/2841   Method used:   Electronically to        [facility] [phone]* (retail)       [address]       [place], [place]  [address]       Ph: [phone] or [phone]       Fax: [phone]   RxID:   [id number] CETIRIZINE HCL 10 MG TABS (CETIRIZINE HCL) Take 1 tablet by mouth once a day  #30 x 4   Entered and Authorized by:   Syliva Overman MD   Signed by:   Syliva Overman MD on 03/19/2010   Method used:   Electronically to        [facility]. [phone]* (retail)       [address]       [place], [place]  [address]       Ph: [phone] or [phone]       Fax: [phone]   RxID:   [id number]

## 2010-08-06 NOTE — Progress Notes (Signed)
  Phone Note Other Incoming   Caller: labs Summary of Call: pls let her know labs are good except potassium low at 3.0, should be 3.5, needs to take the potassium as prescribed, the usual story Initial call taken by: Syliva Overman MD,  July 20, 2009 4:50 PM  Follow-up for Phone Call        patient aware Follow-up by: Worthy Keeler LPN,  July 23, 2009 9:32 AM

## 2010-08-06 NOTE — Progress Notes (Signed)
Summary: refill  Phone Note Call from Patient   Summary of Call: pt needs to get a [facility]ill on [facility]um. [facility] [phone] Initial call taken by: Rudene Anda,  March 12, 2010 9:50 AM  Follow-up for Phone Call        Rx Called In Follow-up by: Adella Hare LPN,  March 12, 2010 10:19 AM    Prescriptions: KLOR-CON M20 20 MEQ CR-TABS (POTASSIUM CHLORIDE CRYS CR) Take 1 tablet by mouth three times a day  #90 x 0   Entered by:   Adella Hare LPN   Authorized by:   Syliva Overman MD   Signed by:   Adella Hare LPN on 09/81/1914   Method used:   Electronically to        [facility] [address] [phone]* (retail)       [address] [place]  [address]       Ph: [phone]       Fax: [phone]   RxID:   [id number]

## 2010-08-06 NOTE — Progress Notes (Signed)
Summary: MEDICINE  Phone Note Call from Patient   Summary of Call: NEEDS HER HYDROCHLOROTHIAZIDE FILLED AT [facility] CALL HER BACK AT [phone] TO LET HER KNOW SAID SHE NEEDS THESE Initial call taken by: Lind Guest,  January 23, 2010 9:39 AM  Follow-up for Phone Call        Phone Call Completed, Rx Called In Follow-up by: Adella Hare LPN,  January 23, 2010 11:37 AM

## 2010-08-06 NOTE — Assessment & Plan Note (Signed)
Summary: sick - room 1   Vital Signs:  Patient profile:   40 year old female Menstrual status:  regular Height:      66.5 inches Weight:      169.25 pounds BMI:     27.01 O2 Sat:      99 % on Room air Pulse rate:   75 / minute Resp:     16 per minute BP sitting:   100 / 62  (left arm)  Vitals Entered By: Adella Hare LPN (October 16, 2009 3:51 PM) CC: sore throat, ears ache, sinus pressure, eye swollen  x 2days Is Patient Diabetic? No Pain Assessment Patient in pain? no        Primary Provider:  Syliva Overman MD  CC:  sore throat, ears ache, sinus pressure, and eye swollen  x 2days.  History of Present Illness: Pt is here today with c/o sinus pressure & eyes swollen since yesterday.  Awoke with yest morning. No nasal drainage, feels like it's packed into her sinuses. No eye drainage or crusting. Bilat ear pain today & has had some sneezing today too. Has felt feverish & chills. Not taking any cold meds.  Hx of seasonal allergies.  Has used Zyrtec in the past, & works well,  but doesnt like to take it because it makes her drowsy.  Current Medications (verified): 1)  Dilt-Cd 120 Mg Xr24h-Cap (Diltiazem Hcl Coated Beads) .... Take 1 Tablet By Mouth Once A Day 2)  Metoprolol Tartrate 25 Mg  Tabs (Metoprolol Tartrate) .... Take 1 Tablet By Mouth Two Times A Day 3)  Hydrochlorothiazide 25 Mg  Tabs (Hydrochlorothiazide) .... Take 1 Tablet By Mouth Once A Day 4)  Klor-Con M20 20 Meq Cr-Tabs (Potassium Chloride Crys Cr) .... Take 1 Tablet By Mouth Three Times A Day 5)  Fluconazole 150 Mg Tabs (Fluconazole) .... Take 1 Tablet By Mouth Once A Day As Needed 6)  Tinactin 1 % Crea (Tolnaftate) .... Apply To Affected Area Twice Daily X 10 Days, Then As Needed  Allergies (verified): 1)  ! Biaxin (Clarithromycin)  Past History:  Past medical history reviewed for relevance to current acute and chronic problems.  Past Medical History: Reviewed history from 10/26/2007 and no  changes required. Current Problems:  THYROID NODULE (ICD-241.0) BACK PAIN (ICD-724.5) CHEST PAIN, RIGHT (ICD-786.50) SLEEP DISORDER (ICD-780.50) OBESITY (ICD-278.00) HYPERTENSION (ICD-401.9)  Review of Systems General:  Complains of chills and fever. ENT:  Complains of ear discharge, nasal congestion, and sinus pressure; denies postnasal drainage and sore throat. CV:  Denies chest pain or discomfort. Resp:  Denies cough and shortness of breath. Allergy:  Complains of seasonal allergies and sneezing; denies itching eyes.  Physical Exam  General:  Well-developed,well-nourished,in no acute distress; alert,appropriate and cooperative throughout examination Head:  Normocephalic and atraumatic without obvious abnormalities. No apparent alopecia or balding. Eyes:  vision grossly intact, pupils equal, pupils round, pupils reactive to light, and no injection.   Ears:  External ear exam shows no significant lesions or deformities.  Otoscopic examination reveals clear canals, tympanic membranes are intact bilaterally without bulging, retraction, inflammation or discharge. Hearing is grossly normal bilaterally. Nose:  no external deformity, mucosal erythema, mucosal edema, L maxillary sinus tenderness, and R maxillary sinus tenderness.   Mouth:  Oral mucosa and oropharynx without lesions or exudates.  Teeth in good repair. Neck:  No deformities, masses, or tenderness noted. Lungs:  Normal respiratory effort, chest expands symmetrically. Lungs are clear to auscultation, no crackles or wheezes. Heart:  Normal rate and regular rhythm. S1 and S2 normal without gallop, murmur, click, rub or other extra sounds. Cervical Nodes:  No lymphadenopathy noted Psych:  Cognition and judgment appear intact. Alert and cooperative with normal attention span and concentration. No apparent delusions, illusions, hallucinations   Impression & Recommendations:  Problem # 1:  ACUTE MAXILLARY SINUSITIS  (ICD-461.0) Assessment New  Her updated medication list for this problem includes:    Amoxicillin 500 Mg Caps (Amoxicillin) .Marland Kitchen... Take 1 caps three times a day x 10days  Problem # 2:  ALLERGIC RHINITIS (ICD-477.9) Assessment: Deteriorated  Her updated medication list for this problem includes:    Loratadine 10 Mg Tabs (Loratadine) .Marland Kitchen... Take 1 daily as needed for allergies  Orders: Depo- Medrol 80mg  (J1040) Admin of Therapeutic Inj  intramuscular or subcutaneous (96789)  Problem # 3:  HYPERTENSION (ICD-401.9) Assessment: Comment Only Controlled.  Her updated medication list for this problem includes:    Dilt-cd 120 Mg Xr24h-cap (Diltiazem hcl coated beads) .Marland Kitchen... Take 1 tablet by mouth once a day    Metoprolol Tartrate 25 Mg Tabs (Metoprolol tartrate) .Marland Kitchen... Take 1 tablet by mouth two times a day    Hydrochlorothiazide 25 Mg Tabs (Hydrochlorothiazide) .Marland Kitchen... Take 1 tablet by mouth once a day  BP today: 100/62 Prior BP: 120/80 (07/19/2009)  Labs Reviewed: K+: 3.3 (02/28/2009) Creat: : 0.93 (02/28/2009)   Chol: 128 (02/28/2009)   HDL: 35 (02/28/2009)   LDL: 82 (02/28/2009)   TG: 56 (02/28/2009)  Complete Medication List: 1)  Dilt-cd 120 Mg Xr24h-cap (Diltiazem hcl coated beads) .... Take 1 tablet by mouth once a day 2)  Metoprolol Tartrate 25 Mg Tabs (Metoprolol tartrate) .... Take 1 tablet by mouth two times a day 3)  Hydrochlorothiazide 25 Mg Tabs (Hydrochlorothiazide) .... Take 1 tablet by mouth once a day 4)  Klor-con M20 20 Meq Cr-tabs (Potassium chloride crys cr) .... Take 1 tablet by mouth three times a day 5)  Fluconazole 150 Mg Tabs (Fluconazole) .... Take 1 tablet by mouth once a day as needed 6)  Tinactin 1 % Crea (Tolnaftate) .... Apply to affected area twice daily x 10 days, then as needed 7)  Amoxicillin 500 Mg Caps (Amoxicillin) .... Take 1 caps three times a day x 10days 8)  Loratadine 10 Mg Tabs (Loratadine) .... Take 1 daily as needed for allergies  Patient  Instructions: 1)  Get plenty of rest, drink lots of clear liquids, and use Tylenol or Ibuprofen for fever and comfort. Return in 7-10 days if you're not better:sooner if you're feeling worse. 2)  You have received an injection of DepoMedrol today to help with your allergies & sinus infection. 3)  I have prescribed Amoxicillin for your antibiotic, and Loratadine for allergies. Prescriptions: LORATADINE 10 MG TABS (LORATADINE) take 1 daily as needed for allergies  #30 x 3   Entered and Authorized by:   Esperanza Sheets PA   Signed by:   Adella Hare LPN on 38/04/1750   Method used:   Electronically to        [facility] [phone]* (retail)       [address] [place], [place]  [address]       Ph: [phone]       Fax: [phone]   RxID:   [id number] AMOXICILLIN 500 MG CAPS (AMOXICILLIN) take 1 caps three times a day x 10days  #30 x 0   Entered and Authorized by:   Esperanza Sheets  PA   Signed by:   Adella Hare LPN on 93/81/0175   Method used:   Electronically to        [facility]. [phone]* (retail)       [address], [place]  [address]       Ph: [phone]       Fax: [phone]   RxID:   [id number]    Medication Administration  Injection # 1:    Medication: Depo- Medrol 80mg     Diagnosis: ALLERGIC RHINITIS (ICD-477.9)    Route: IM    Site: LUOQ gluteus    Exp Date: 11/11    Lot #: [id number]    Mfr: Pharmacia    Patient tolerated injection without complications    Given by: Adella Hare LPN (October 16, 2009 4:46 PM)  Orders Added: 1)  Est. Patient Level IV [98338] 2)  Depo- Medrol 80mg  [J1040] 3)  Admin of Therapeutic Inj  intramuscular or subcutaneous [25053]

## 2010-08-06 NOTE — Progress Notes (Signed)
Summary: cold / ears  Phone Note Call from Patient   Summary of Call: SHE HAS A COLD AND EARS HURTS DR. FERGUSON GAVE HER AN ANTI. CIPRO AND IT IS DOING NO good  call back at [phone] we had no appts ava. for this morning Initial call taken by: Lind Guest,  June 07, 2010 9:41 AM  Follow-up for Phone Call        advise give med some time and offer work in early next week pls Follow-up by: Syliva Overman MD,  June 07, 2010 12:31 PM  Additional Follow-up for Phone Call Additional follow up Details #1::        patient aware Additional Follow-up by: Adella Hare LPN,  June 07, 2010 2:19 PM

## 2010-08-06 NOTE — Assessment & Plan Note (Signed)
Summary: office visit   Vital Signs:  Patient profile:   40 year old female Menstrual status:  regular Height:      66.5 inches Weight:      169.50 pounds BMI:     27.05 O2 Sat:      97 % on Room air Pulse rate:   64 / minute Pulse rhythm:   regular Resp:     16 per minute BP sitting:   120 / 80  (left arm)  Vitals Entered By: Worthy Keeler LPN (July 19, 2009 1:27 PM)  Nutrition Counseling: Patient's BMI is greater than 25 and therefore counseled on weight management options.  O2 Flow:  Room air CC: follow-up visit Is Patient Diabetic? No Pain Assessment Patient in pain? no        Primary Care Provider:  Syliva Overman MD  CC:  follow-up visit.  History of Present Illness: 2 day h/o facial pressure yellow green nasal drainage with sore throat. Still has low back pain. Has fungal rash on right forearm  x 2weeks, her  son has ringworm . The pt has been having excessive menstrual bleeding with merena , and is electing to go back on rthe depo and remove it. she is not exercising regularly, has gained 2 pounds, but intends to reverse this. She is no longer depressed, sleeps qwell, and has less generalised aches and pains. She remains separated, and anticipats divorce in the next several months.   Current Medications (verified): 1)  Dilt-Cd 120 Mg Xr24h-Cap (Diltiazem Hcl Coated Beads) .... Take 1 Tablet By Mouth Once A Day 2)  Metoprolol Tartrate 25 Mg  Tabs (Metoprolol Tartrate) .... Take 1 Tablet By Mouth Two Times A Day 3)  Hydrochlorothiazide 25 Mg  Tabs (Hydrochlorothiazide) .... Take 1 Tablet By Mouth Once A Day 4)  Klor-Con M20 20 Meq Cr-Tabs (Potassium Chloride Crys Cr) .... Take 1 Tablet By Mouth Three Times A Day  Allergies (verified): 1)  ! Biaxin (Clarithromycin)  Review of Systems      See HPI General:  Denies chills, fatigue, fever, malaise, and weakness. Eyes:  Denies blurring and discharge. ENT:  See HPI; Complains of nasal congestion, postnasal  drainage, and sinus pressure. CV:  Denies chest pain or discomfort, difficulty breathing while lying down, palpitations, and swelling of feet. Resp:  Denies cough and sputum productive. GI:  Denies abdominal pain, constipation, diarrhea, nausea, and vomiting. GU:  Complains of abnormal vaginal bleeding; denies dysuria, urinary frequency, and urinary hesitancy. MS:  Denies joint pain and stiffness. Derm:  See HPI; Complains of itching, lesion(s), and rash. Neuro:  Denies headaches, seizures, and sensation of room spinning. Endo:  Denies cold intolerance, excessive hunger, excessive thirst, excessive urination, heat intolerance, polyuria, and weight change. Heme:  Denies abnormal bruising, enlarge lymph nodes, and fevers. Allergy:  Complains of seasonal allergies.  Physical Exam  General:  Well-developed,well-nourished,in no acute distress; alert,appropriate and cooperative throughout examination HEENT: No facial asymmetry,  EOMI, maxillary  sinus tenderness, TM's Clear, oropharynx  pink and moist. Nasal mucosa erythematous and edematous.  Chest: Clear to auscultation bilaterally.  CVS: S1, S2, No murmurs, No S3.   Abd: Soft, Nontender.  MS: Adequate ROM spine, hips, shoulders and knees.  Ext: No edema.   CNS: CN 2-12 intact, power tone and sensation normal throughout.   Skin: Intact, tinea on right forearm Psych: Good eye contact, normal affect.  Memory intact, not anxious or depressed appearing.    Impression & Recommendations:  Problem # 1:  DEPRESSION (ICD-311) Assessment Comment Only resolved, primarily doe to new living situation  Problem # 2:  NECK PAIN (ICD-723.1) Assessment: Comment Only resolved  Problem # 3:  OVERWEIGHT (ICD-278.02) Assessment: Unchanged  Ht: 66.5 (07/19/2009)   Wt: 169.50 (07/19/2009)   BMI: 27.05 (07/19/2009)  Problem # 4:  HYPERTENSION (ICD-401.9) Assessment: Unchanged  Her updated medication list for this problem includes:    Dilt-cd 120 Mg  Xr24h-cap (Diltiazem hcl coated beads) .Marland Kitchen... Take 1 tablet by mouth once a day    Metoprolol Tartrate 25 Mg Tabs (Metoprolol tartrate) .Marland Kitchen... Take 1 tablet by mouth two times a day    Hydrochlorothiazide 25 Mg Tabs (Hydrochlorothiazide) .Marland Kitchen... Take 1 tablet by mouth once a day  BP today: 120/80 Prior BP: 112/80 (03/22/2009)  Labs Reviewed: K+: 3.3 (02/28/2009) Creat: : 0.93 (02/28/2009)   Chol: 128 (02/28/2009)   HDL: 35 (02/28/2009)   LDL: 82 (02/28/2009)   TG: 56 (02/28/2009)  Problem # 5:  TINEA CORPORIS (ICD-110.5) Assessment: Comment Only tinactin creAM TWICE DAILY  Problem # 6:  ACUTE MAXILLARY SINUSITIS (ICD-461.0) Assessment: Comment Only  The following medications were removed from the medication list:    Veetids 500 Mg Tabs (Penicillin v potassium) .Marland Kitchen... Take 1 tablet by mouth three times a day    Tessalon Perles 100 Mg Caps (Benzonatate) .Marland Kitchen... Take 1 capsule by mouth three times a day Her updated medication list for this problem includes:    Zithromax Z-pak 250 Mg Tabs (Azithromycin) .Marland Kitchen..Marland Kitchen Two tablets on day one and one daily for the next 4 days  Complete Medication List: 1)  Dilt-cd 120 Mg Xr24h-cap (Diltiazem hcl coated beads) .... Take 1 tablet by mouth once a day 2)  Metoprolol Tartrate 25 Mg Tabs (Metoprolol tartrate) .... Take 1 tablet by mouth two times a day 3)  Hydrochlorothiazide 25 Mg Tabs (Hydrochlorothiazide) .... Take 1 tablet by mouth once a day 4)  Klor-con M20 20 Meq Cr-tabs (Potassium chloride crys cr) .... Take 1 tablet by mouth three times a day 5)  Zithromax Z-pak 250 Mg Tabs (Azithromycin) .... Two tablets on day one and one daily for the next 4 days 6)  Fluconazole 150 Mg Tabs (Fluconazole) .... Take 1 tablet by mouth once a day as needed 7)  Tinactin 1 % Crea (Tolnaftate) .... Apply to affected area twice daily x 10 days, then as needed  Patient Instructions: 1)  F/U in 5.27months 2)  It is important that you exercise regularly at least 20 minutes 5  times a week. If you develop chest pain, have severe difficulty breathing, or feel very tired , stop exercising immediately and seek medical attention. 3)  You need to lose weight. Consider a lower calorie diet and regular exercise.  4)  You are being treated for sinusitis and meds are sent in for the fungal infection. Prescriptions: HYDROCHLOROTHIAZIDE 25 MG  TABS (HYDROCHLOROTHIAZIDE) Take 1 tablet by mouth once a day  #30 x 5   Entered by:   Worthy Keeler LPN   Authorized by:   Syliva Overman MD   Signed by:   Worthy Keeler LPN on 16/04/9603   Method used:   Electronically to        [facility] [address] [phone]* (retail)       [address] [place], [place]  [address]       Ph: [phone]       Fax: [phone]   RxID:   [id number] METOPROLOL TARTRATE  25 MG  TABS (METOPROLOL TARTRATE) Take 1 tablet by mouth two times a day  #60 x 5   Entered by:   Worthy Keeler LPN   Authorized by:   Syliva Overman MD   Signed by:   Worthy Keeler LPN on 60/45/4098   Method used:   Electronically to        [facility] [address] [phone]* (retail)       [address] [place], [place]  [address]       Ph: [phone]       Fax: [phone]   RxID:   [id number] TINACTIN 1 % CREA (TOLNAFTATE) apply to affected area twice daily x 10 days, then as needed  #30 mg x 1   Entered and Authorized by:   Syliva Overman MD   Signed by:   Syliva Overman MD on 07/19/2009   Method used:   Electronically to        [facility] [address] [phone]* (retail)       [address] [place]  [address]       Ph: [phone]       Fax: [phone]   RxID:   [id number] FLUCONAZOLE 150 MG TABS (FLUCONAZOLE) Take 1 tablet by mouth once a day as needed  #3 x 0   Entered and Authorized by:   Syliva Overman MD   Signed by:   Syliva Overman MD on 07/19/2009   Method used:   Electronically to        [facility] [address] [phone]* (retail)       [address] [place], [place]   [address]       Ph: [phone]       Fax: [phone]   RxID:   [id number] ZITHROMAX Z-PAK 250 MG TABS (AZITHROMYCIN) two tablets on day one and one daily for the next 4 days  #6 x 0   Entered and Authorized by:   Syliva Overman MD   Signed by:   Syliva Overman MD on 07/19/2009   Method used:   Electronically to        [facility] [address] [phone]* (retail)       [address] [place]  [address]       Ph: [phone]       Fax: [phone]   RxID:   [id number]

## 2010-08-06 NOTE — Progress Notes (Signed)
Summary: MED  Phone Note Call from Patient   Summary of Call: DR FERGUSON GAVE HER A ANT AND COULD NOT TAKE IT FOR KIDNEY INFECTION AND WILL YOU SEND IN SOMETHING ELSE [facility] [place] Initial call taken by: Lind Guest,  April 17, 2010 3:03 PM  Follow-up for Phone Call        advised her to call ferguson Follow-up by: Everitt Amber LPN,  April 17, 2010 3:07 PM

## 2010-08-06 NOTE — Progress Notes (Signed)
Summary: NEDS ANTI  Phone Note Call from Patient   Summary of Call: SHE HAS 3 TEETH THAT NEEDS PULLING AND SHE CAN NOT GET IT DONE UNTIL THE 20TH OF THIS MONTH AND IT IS HURTING SHE THINKS 1 MAYBE ABCESSED SHE NEEDS A ANTI. AND A DIFLUCAN SEND TO [facility] BACK AT [phone] Initial call taken by: Lind Guest,  December 11, 2009 3:21 PM  Follow-up for Phone Call        She wants antibiotic until she can get to the dentist at the end of the month. Does she need to come in for OV?  Follow-up by: Everitt Amber LPN,  December 11, 100 3:34 PM  Additional Follow-up for Phone Call Additional follow up Details #1::        let her know meds sent in pls Additional Follow-up by: Syliva Overman MD,  December 13, 2009 7:55 AM    Additional Follow-up for Phone Call Additional follow up Details #2::    PATIENT AWARE Follow-up by: Lind Guest,  December 13, 2009 8:44 AM  New/Updated Medications: PENICILLIN V POTASSIUM 500 MG TABS (PENICILLIN V POTASSIUM) Take 1 tablet by mouth three times a day FLUCONAZOLE 150 MG TABS (FLUCONAZOLE) Take 1 tablet by mouth once a day as needed Prescriptions: FLUCONAZOLE 150 MG TABS (FLUCONAZOLE) Take 1 tablet by mouth once a day as needed  #3 x 0   Entered and Authorized by:   Syliva Overman MD   Signed by:   Syliva Overman MD on 12/12/2009   Method used:   Electronically to        [facility] [address] [phone]* (retail)       [address], [place]  [address]       Ph: [phone]       Fax: [phone]   RxID:   [id number] PENICILLIN V POTASSIUM 500 MG TABS (PENICILLIN V POTASSIUM) Take 1 tablet by mouth three times a day  #30 x 0   Entered and Authorized by:   Syliva Overman MD   Signed by:   Syliva Overman MD on 12/12/2009   Method used:   Electronically to        [facility] [address] [phone]* (retail)       [address] [place]  [address]       Ph: [phone]       Fax: [phone]   RxID:   [id number]

## 2010-08-06 NOTE — Progress Notes (Signed)
Summary: results of mammo  Phone Note Call from Patient   Summary of Call: would like to get results of mammo [phone] Initial call taken by: Rudene Anda,  January 29, 2010 11:25 AM  Follow-up for Phone Call        returned call, no answer, mailed normal letter Follow-up by: Adella Hare LPN,  January 29, 2010 2:25 PM

## 2010-08-06 NOTE — Progress Notes (Signed)
Summary: MEDICINE  Phone Note Call from Patient   Summary of Call: NEEDS DIFLICAN CALLED INTO [facility] FROM TAKING THE ANTI. CALL BACK AT  [phone] Initial call taken by: Lind Guest,  Nov 28, 2009 3:43 PM  Follow-up for Phone Call        script sent in pls let her know Follow-up by: Syliva Overman MD,  Nov 29, 2009 4:33 PM  Additional Follow-up for Phone Call Additional follow up Details #1::        called patient left message Additional Follow-up by: Everitt Amber LPN,  Nov 30, 2009 8:42 AM    New/Updated Medications: FLUCONAZOLE 150 MG TABS (FLUCONAZOLE) Take 1 tablet by mouth once a day as needed Prescriptions: FLUCONAZOLE 150 MG TABS (FLUCONAZOLE) Take 1 tablet by mouth once a day as needed  #3 x 0   Entered and Authorized by:   Syliva Overman MD   Signed by:   Syliva Overman MD on 11/29/2009   Method used:   Electronically to        [facility] [address] [phone]* (retail)       [address] [place], [place]  [address]       Ph: [phone]       Fax: [phone]   RxID:   [id number]   Appended Document: MEDICINE patient aware

## 2010-08-08 NOTE — Letter (Signed)
Summary: LAB ADD ON  LAB ADD ON   Imported By: Lind Guest 07/30/2010 10:00:53  _____________________________________________________________________  External Attachment:    Type:   Image     Comment:   External Document

## 2010-08-08 NOTE — Letter (Signed)
Summary: Out of Work  [facility]  [address]   [place], [place] [address]   Phone: [phone]  Fax: [phone]    July 25, 2010   Employee:  [name]    To Whom It May Concern:   For Medical reasons, please excuse the above named employee from work for the following dates:  Start:   07/25/10  End:   07/29/10 to return with no restrictions  If you need additional information, please feel free to contact our office.         Sincerely,    Milus Mallick. Lodema Hong, MD

## 2010-08-14 NOTE — Assessment & Plan Note (Signed)
Summary: office visit   Vital Signs:  Patient profile:   40 year old female Menstrual status:  regular Height:      66.5 inches Weight:      174.50 pounds BMI:     27.84 O2 Sat:      98 % Pulse rate:   69 / minute Pulse rhythm:   regular Resp:     16 per minute BP sitting:   118 / 82  (left arm) Cuff size:   large  Vitals Entered By: Everitt Amber LPN (July 25, 2010 10:12 AM)  Nutrition Counseling: Patient's BMI is greater than 25 and therefore counseled on weight management options. CC: Follow up, c/o scratchy throat yesterday, yellowish sinus congestion, woke up this morning hoarse   Primary Care Provider:  Syliva Overman MD  CC:  Follow up, c/o scratchy throat yesterday, yellowish sinus congestion, and woke up this morning hoarse.  History of Present Illness: Reports  that she has been doing well until 1 day ago.   Denies chest pain, palpitations, PND, orthopnea or leg swelling. Denies abdominal pain, nausea, vomitting, diarrhea or constipation. Denies change in bowel movements or bloody stool. Denies dysuria , frequency, incontinence or hesitancy. Denies  joint pain, swelling, or reduced mobility. Denies headaches, vertigo, seizures. Denies depression, anxiety or insomnia. Denies  rash, lesions, or itch.     Current Medications (verified): 1)  Dilt-Cd 120 Mg Xr24h-Cap (Diltiazem Hcl Coated Beads) .... Take 1 Tablet By Mouth Once A Day 2)  Metoprolol Tartrate 25 Mg  Tabs (Metoprolol Tartrate) .... Take 1 Tablet By Mouth Two Times A Day 3)  Hydrochlorothiazide 25 Mg  Tabs (Hydrochlorothiazide) .... Take 1 Tablet By Mouth Once A Day 4)  Klor-Con M20 20 Meq Cr-Tabs (Potassium Chloride Crys Cr) .... Take 1 Tablet By Mouth Three Times A Day 5)  Cetirizine Hcl 10 Mg Tabs (Cetirizine Hcl) .... Take 1 Tablet By Mouth Once A Day  Allergies (verified): 1)  ! Biaxin (Clarithromycin)  Review of Systems      See HPI Eyes:  Complains of vision loss-both eyes. ENT:   Complains of hoarseness, nasal congestion, postnasal drainage, ringing in ears, and sore throat. Resp:  Complains of cough and sputum productive. Endo:  Denies cold intolerance, excessive hunger, excessive thirst, excessive urination, and heat intolerance. Heme:  Denies abnormal bruising and bleeding. Allergy:  Complains of seasonal allergies.  Physical Exam  General:  Well-developed,well-nourished,in no acute distress; alert,appropriate and cooperative throughout examination HEENT: No facial asymmetry,  EOMI, frontal and maxillary sinus tenderness, TM's Clear, oropharynx  pink and moist.   Chest: adequate air entry, bilateral crackles and a few wheezes CVS: S1, S2, No murmurs, No S3.   Abd: Soft, Nontender.  MS: Adequate ROM spine, hips, shoulders and knees.  Ext: No edema.   CNS: CN 2-12 intact, power tone and sensation normal throughout.   Skin: Intact, no visible lesions or rashes.  Psych: Good eye contact, normal affect.  Memory intact, not anxious or depressed appearing.    Impression & Recommendations:  Problem # 1:  ACUTE BRONCHITIS (ICD-466.0) Assessment Comment Only  Her updated medication list for this problem includes:    Penicillin V Potassium 500 Mg Tabs (Penicillin v potassium) .Marland Kitchen... Take 1 tablet by mouth three times a day for 10 days    Tessalon Perles 100 Mg Caps (Benzonatate) .Marland Kitchen... Take 1 capsule by mouth three times a day  Problem # 2:  ACUTE MAXILLARY SINUSITIS (ICD-461.0) Assessment: Comment Only  Her updated  medication list for this problem includes:    Penicillin V Potassium 500 Mg Tabs (Penicillin v potassium) .Marland Kitchen... Take 1 tablet by mouth three times a day for 10 days    Tessalon Perles 100 Mg Caps (Benzonatate) .Marland Kitchen... Take 1 capsule by mouth three times a day  Problem # 3:  LARYNGITIS, ACUTE (ICD-464.00) Assessment: Comment Only voice rest and salt water gargles advised, tylenol for pain as needed  Problem # 4:  HYPERTENSION (ICD-401.9) Assessment:  Improved  Her updated medication list for this problem includes:    Dilt-cd 120 Mg Xr24h-cap (Diltiazem hcl coated beads) .Marland Kitchen... Take 1 tablet by mouth once a day    Metoprolol Tartrate 25 Mg Tabs (Metoprolol tartrate) .Marland Kitchen... Take 1 tablet by mouth two times a day    Hydrochlorothiazide 25 Mg Tabs (Hydrochlorothiazide) .Marland Kitchen... Take 1 tablet by mouth once a day  BP today: 118/82 Prior BP: 128/80 (03/19/2010)  Labs Reviewed: K+: 3.3 (02/28/2009) Creat: : 0.93 (02/28/2009)   Chol: 128 (02/28/2009)   HDL: 35 (02/28/2009)   LDL: 82 (02/28/2009)   TG: 56 (02/28/2009)  Problem # 5:  OVERWEIGHT (ICD-278.02) Assessment: Unchanged  Ht: 66.5 (07/25/2010)   Wt: 174.50 (07/25/2010)   BMI: 27.84 (07/25/2010) therapeutic lifestyle change discussed and encouraged  Complete Medication List: 1)  Dilt-cd 120 Mg Xr24h-cap (Diltiazem hcl coated beads) .... Take 1 tablet by mouth once a day 2)  Metoprolol Tartrate 25 Mg Tabs (Metoprolol tartrate) .... Take 1 tablet by mouth two times a day 3)  Hydrochlorothiazide 25 Mg Tabs (Hydrochlorothiazide) .... Take 1 tablet by mouth once a day 4)  Klor-con M20 20 Meq Cr-tabs (Potassium chloride crys cr) .... Take 1 tablet by mouth three times a day 5)  Cetirizine Hcl 10 Mg Tabs (Cetirizine hcl) .... Take 1 tablet by mouth once a day 6)  Ibuprofen 800 Mg Tabs (Ibuprofen) .... Take 1 tab by mouth at bedtime for 14 days, then as needed 7)  Cyclobenzaprine Hcl 10 Mg Tabs (Cyclobenzaprine hcl) .... Take 1 tab by mouth at bedtime for 10 nights, then as needed 8)  Penicillin V Potassium 500 Mg Tabs (Penicillin v potassium) .... Take 1 tablet by mouth three times a day for 10 days 9)  Tessalon Perles 100 Mg Caps (Benzonatate) .... Take 1 capsule by mouth three times a day 10)  Fluconazole 150 Mg Tabs (Fluconazole) .... Take 1 tablet by mouth once a day as needed for vaginal itch  Patient Instructions: 1)  Please schedule a follow-up appointment in 4months.cancel feb appt 2)   you are being treated for sinusitis , laryngitis and bronchitis. 3)  work excuse to return 07/29/3010 4)  fasting labs past due, pls do asap Prescriptions: FLUCONAZOLE 150 MG TABS (FLUCONAZOLE) Take 1 tablet by mouth once a day as needed for vaginal itch  #3 x 0   Entered and Authorized by:   Syliva Overman MD   Signed by:   Syliva Overman MD on 07/25/2010   Method used:   [facility]        [facility] [phone]* (retail)       [address]       [place], [place]  [address]       Ph: [phone]       Fax: [phone]   RxID:   [id number] TESSALON PERLES 100 MG CAPS (BENZONATATE) Take 1 capsule by mouth three times a day  #30 x 0   Entered  and Authorized by:   Syliva Overman MD   Signed by:   Syliva Overman MD on 07/25/2010   Method used:   [facility] to        [facility] [phone]* (retail)       [address]       [place], [place]  [address]       Ph: [phone]       Fax: [phone]   RxID:   [id number] PENICILLIN V POTASSIUM 500 MG TABS (PENICILLIN V POTASSIUM) Take 1 tablet by mouth three times a day for 10 days  #30 x 0   Entered and Authorized by:   Syliva Overman MD   Signed by:   Syliva Overman MD on 07/25/2010   Method used:   [facility] to        [facility] [phone]* (retail)       [address]       [place], [place]  [address]       Ph: [phone]       Fax: [phone]   RxID:   [id number] CYCLOBENZAPRINE HCL 10 MG TABS (CYCLOBENZAPRINE HCL) Take 1 tab by mouth at bedtime for 10 nights, then as needed  #30 x 0   Entered and Authorized by:   Syliva Overman MD   Signed by:   Syliva Overman MD on 07/25/2010   Method used:   [facility] to        [facility] [phone]* (retail)       [address]       [place], [place]  [address]       Ph: [phone]       Fax: [phone]   RxID:   [id number] PREDNISONE (PAK) 5 MG TABS  (PREDNISONE) Use as directed  #21 x 0   Entered and Authorized by:   Syliva Overman MD   Signed by:   Syliva Overman MD on 07/25/2010   Method used:   [facility]ically to        [facility] [phone]* (retail)       [address]       [place], [place]  [address]       Ph: [phone]       Fax: [phone]   RxID:   [id number] IBUPROFEN 800 MG TABS (IBUPROFEN) Take 1 tab by mouth at bedtime for 14 days, then as needed  #30 x 0   Entered and Authorized by:   Syliva Overman MD   Signed by:   Syliva Overman MD on 07/25/2010   Method used:   [facility]ronically to        [facility] [phone]* (retail)       [address]       [place], [place]  [address]       Ph: [phone]       Fax: [phone]   RxID:   [id number]    Orders Added: 1)  Est. Patient Level IV [[address]]

## 2010-09-11 ENCOUNTER — Encounter: Payer: Self-pay | Admitting: Family Medicine

## 2010-09-11 ENCOUNTER — Ambulatory Visit (INDEPENDENT_AMBULATORY_CARE_PROVIDER_SITE_OTHER): Payer: Private Health Insurance - Indemnity | Admitting: Family Medicine

## 2010-09-11 DIAGNOSIS — I10 Essential (primary) hypertension: Secondary | ICD-10-CM

## 2010-09-11 DIAGNOSIS — J0100 Acute maxillary sinusitis, unspecified: Secondary | ICD-10-CM

## 2010-09-11 DIAGNOSIS — E663 Overweight: Secondary | ICD-10-CM

## 2010-09-16 LAB — URINALYSIS, ROUTINE W REFLEX MICROSCOPIC
Bilirubin Urine: NEGATIVE
Glucose, UA: NEGATIVE mg/dL
Hgb urine dipstick: NEGATIVE
Ketones, ur: NEGATIVE mg/dL
Nitrite: NEGATIVE
Protein, ur: NEGATIVE mg/dL
Specific Gravity, Urine: 1.01 (ref 1.005–1.030)
Urobilinogen, UA: 0.2 mg/dL (ref 0.0–1.0)
pH: 6 (ref 5.0–8.0)

## 2010-09-16 LAB — POCT PREGNANCY, URINE: Preg Test, Ur: NEGATIVE

## 2010-09-17 NOTE — Letter (Signed)
Summary: Out of Work  [facility]  [address]   [place], [place] [address]   Phone: [phone]  Fax: [phone]    September 11, 2010   Employee:  REKIA KUJALA MCBEE    To Whom It May Concern:   For Medical reasons, please excuse the above named employee from work for the following dates:  Start:   09/11/10  End:   09/13/10 to return with no restrictions  If you need additional information, please feel free to contact our office.         Sincerely,    Milus Mallick. Lodema Hong, MD

## 2010-09-19 DIAGNOSIS — E669 Obesity, unspecified: Secondary | ICD-10-CM | POA: Insufficient documentation

## 2010-09-23 LAB — RAPID STREP SCREEN (MED CTR MEBANE ONLY): Streptococcus, Group A Screen (Direct): NEGATIVE

## 2010-09-24 NOTE — Assessment & Plan Note (Signed)
Summary: office visit   Vital Signs:  Patient profile:   40 year old female Menstrual status:  regular Height:      66.5 inches Weight:      178 pounds BMI:     28.40 O2 Sat:      97 % Temp:     98.9 degrees F oral Pulse rate:   74 / minute Pulse rhythm:   regular Resp:     16 per minute BP sitting:   110 / 84  (left arm)  Vitals Entered By: Everitt Amber LPN   Nutrition Counseling: Patient's BMI is greater than 25 and therefore counseled on weight management options. CC: c/o headahee that started lastnight and today she has felt awful, started coughing up some greenish phlegm this morning, sinus pressure and pain in face   Primary Care Provider:  Syliva Overman MD  CC:  c/o headahee that started lastnight and today she has felt awful, started coughing up some greenish phlegm this morning, and sinus pressure and pain in face.  History of Present Illness: Reports  that she had been well up until 2 days ago Denies recent fever haS HAD  chills. REPORTS  sinus pressure, nasal congestion , an d  sore throat. reports  chest congestion, and  cough productive of sputum. Denies chest pain, palpitations, PND, orthopnea or leg swelling. Denies abdominal pain, nausea, vomitting, diarrhea or constipation. Denies change in bowel movements or bloody stool. Denies dysuria , frequency, incontinence or hesitancy. Denies  joint pain, swelling, or reduced mobility. Deniesvertigo, seizures. Denies depression, anxiety or insomnia. Denies  rash, lesions, or itch.     Current Medications (verified): 1)  Dilt-Cd 120 Mg Xr24h-Cap (Diltiazem Hcl Coated Beads) .... Take 1 Tablet By Mouth Once A Day 2)  Metoprolol Tartrate 25 Mg  Tabs (Metoprolol Tartrate) .... Take 1 Tablet By Mouth Two Times A Day 3)  Hydrochlorothiazide 25 Mg  Tabs (Hydrochlorothiazide) .... Take 1 Tablet By Mouth Once A Day 4)  Klor-Con M20 20 Meq Cr-Tabs (Potassium Chloride Crys Cr) .... Take 1 Tablet By Mouth Three Times A  Day  Allergies (verified): 1)  ! Biaxin (Clarithromycin)  Review of Systems      See HPI General:  Complains of chills. ENT:  Complains of postnasal drainage and sinus pressure; maxillary pressure and postnasal drainage since yesterday.Marland Kitchen Resp:  Complains of cough and sputum productive. Neuro:  Complains of headaches. Endo:  Denies cold intolerance and excessive hunger. Heme:  Denies abnormal bruising and bleeding. Allergy:  Complains of seasonal allergies; denies hives or rash and itching eyes.  Physical Exam  General:  Well-developed,well-nourished,in no acute distress; alert,appropriate and cooperative throughout examination HEENT: No facial asymmetry,  EOMI, frontal and maxillary sinus tenderness, TM's Clear, oropharynx  pink and moist.   Chest: adequate air entry, bilateral crackles and a few wheezes CVS: S1, S2, No murmurs, No S3.   Abd: Soft, Nontender.  MS: Adequate ROM spine, hips, shoulders and knees.  Ext: No edema.   CNS: CN 2-12 intact, power tone and sensation normal throughout.   Skin: Intact, no visible lesions or rashes.  Psych: Good eye contact, normal affect.  Memory intact, not anxious or depressed appearing.    Impression & Recommendations:  Problem # 1:  ACUTE BRONCHITIS (ICD-466.0) Assessment Comment Only  The following medications were removed from the medication list:    Penicillin V Potassium 500 Mg Tabs (Penicillin v potassium) .Marland Kitchen... Take 1 tablet by mouth three times a day for 10  days    Tessalon Perles 100 Mg Caps (Benzonatate) .Marland Kitchen... Take 1 capsule by mouth three times a day Her updated medication list for this problem includes:    Septra Ds 800-160 Mg Tabs (Sulfamethoxazole-trimethoprim) .Marland Kitchen... Take 1 tablet by mouth two times a day  Take antibiotics and other medications as directed. Encouraged to push clear liquids, get enough rest, and take acetaminophen as needed. To be seen in 5-7 days if no improvement, sooner if worse.  Problem # 2:   ACUTE MAXILLARY SINUSITIS (ICD-461.0) Assessment: Comment Only  The following medications were removed from the medication list:    Penicillin V Potassium 500 Mg Tabs (Penicillin v potassium) .Marland Kitchen... Take 1 tablet by mouth three times a day for 10 days    Tessalon Perles 100 Mg Caps (Benzonatate) .Marland Kitchen... Take 1 capsule by mouth three times a day Her updated medication list for this problem includes:    Septra Ds 800-160 Mg Tabs (Sulfamethoxazole-trimethoprim) .Marland Kitchen... Take 1 tablet by mouth two times a day  Instructed on treatment. Call if symptoms persist or worsen.   Problem # 3:  HYPERTENSION (ICD-401.9) Assessment: Unchanged  Her updated medication list for this problem includes:    Dilt-cd 120 Mg Xr24h-cap (Diltiazem hcl coated beads) .Marland Kitchen... Take 1 tablet by mouth once a day    Metoprolol Tartrate 25 Mg Tabs (Metoprolol tartrate) .Marland Kitchen... Take 1 tablet by mouth two times a day    Hydrochlorothiazide 25 Mg Tabs (Hydrochlorothiazide) .Marland Kitchen... Take 1 tablet by mouth once a day  BP today: 110/84 Prior BP: 118/82 (07/25/2010)  Labs Reviewed: K+: 3.3 (07/29/2010) Creat: : 1.00 (07/29/2010)   Chol: 120 (07/27/2010)   HDL: 33 (07/27/2010)   LDL: 76 (07/27/2010)   TG: 56 (07/27/2010)  Problem # 4:  OVERWEIGHT (ICD-278.02) Assessment: Deteriorated  Ht: 66.5 (09/11/2010)   Wt: 178 (09/11/2010)   BMI: 28.40 (09/11/2010) therapeutic lifestyle change discussed and encouraged  Complete Medication List: 1)  Dilt-cd 120 Mg Xr24h-cap (Diltiazem hcl coated beads) .... Take 1 tablet by mouth once a day 2)  Metoprolol Tartrate 25 Mg Tabs (Metoprolol tartrate) .... Take 1 tablet by mouth two times a day 3)  Hydrochlorothiazide 25 Mg Tabs (Hydrochlorothiazide) .... Take 1 tablet by mouth once a day 4)  Klor-con M20 20 Meq Cr-tabs (Potassium chloride crys cr) .... Take 1 tablet by mouth three times a day 5)  Septra Ds 800-160 Mg Tabs (Sulfamethoxazole-trimethoprim) .... Take 1 tablet by mouth two times a day 6)   Fluconazole 150 Mg Tabs (Fluconazole) .... Take 1 tablet by mouth once a day as needed for vaginal itch  Patient Instructions: 1)  Please schedule a follow-up appointment in 4.5 months. 2)  It is important that you exercise regularly at least 40 minutes 5 times a week. If you develop chest pain, have severe difficulty breathing, or feel very tired , stop exercising immediately and seek medical attention. 3)  You need to lose weight. Consider a lower calorie diet and regular exercise.  4)  You are being treated  for maxillary sinusitis and bronchitis. 5)   Work excuse 03/07 to 09/13/2010 Prescriptions: FLUCONAZOLE 150 MG TABS (FLUCONAZOLE) Take 1 tablet by mouth once a day as needed for vaginal itch  #3 x 0   Entered and Authorized by:   Syliva Overman MD   Signed by:   Syliva Overman MD on 09/11/2010   Method used:   Electronically to        [facility]  [address] [phone] (retail)  [address]       [place], [place]  [address]       Ph: [phone]       Fax: [phone]   RxID:   [id number] SEPTRA DS 800-160 MG TABS (SULFAMETHOXAZOLE-TRIMETHOPRIM) Take 1 tablet by mouth two times a day  #20 x 0   Entered and Authorized by:   Syliva Overman MD   Signed by:   Syliva Overman MD on 09/11/2010   Method used:   Electronically to        [facility] [phone]* (retail)       [address]       [place], [place]  [address]       Ph: [phone]       Fax: [phone]   RxID:   [id number]    Orders Added: 1)  Est. Patient Level IV [32355]

## 2010-10-16 ENCOUNTER — Other Ambulatory Visit: Payer: Self-pay | Admitting: Obstetrics & Gynecology

## 2010-10-16 ENCOUNTER — Other Ambulatory Visit: Payer: Self-pay | Admitting: Adult Health

## 2010-10-16 ENCOUNTER — Other Ambulatory Visit (HOSPITAL_COMMUNITY)
Admission: RE | Admit: 2010-10-16 | Discharge: 2010-10-16 | Disposition: A | Discharge status: Home / Self Care | Payer: Private Health Insurance - Indemnity | Source: Ambulatory Visit | Attending: Obstetrics and Gynecology | Admitting: Obstetrics and Gynecology

## 2010-10-16 DIAGNOSIS — Z01419 Encounter for gynecological examination (general) (routine) without abnormal findings: Secondary | ICD-10-CM | POA: Insufficient documentation

## 2010-10-16 DIAGNOSIS — Z113 Encounter for screening for infections with a predominantly sexual mode of transmission: Secondary | ICD-10-CM | POA: Insufficient documentation

## 2010-10-16 DIAGNOSIS — E049 Nontoxic goiter, unspecified: Secondary | ICD-10-CM

## 2010-10-17 ENCOUNTER — Telehealth: Payer: Self-pay | Admitting: Family Medicine

## 2010-10-17 ENCOUNTER — Ambulatory Visit (HOSPITAL_COMMUNITY)
Admission: RE | Admit: 2010-10-17 | Discharge: 2010-10-17 | Disposition: A | Discharge status: Home / Self Care | Payer: Private Health Insurance - Indemnity | Source: Ambulatory Visit | Attending: Obstetrics & Gynecology | Admitting: Obstetrics & Gynecology

## 2010-10-17 DIAGNOSIS — E042 Nontoxic multinodular goiter: Secondary | ICD-10-CM | POA: Insufficient documentation

## 2010-10-17 DIAGNOSIS — E049 Nontoxic goiter, unspecified: Secondary | ICD-10-CM

## 2010-10-17 NOTE — Telephone Encounter (Signed)
pls order fasting chem 7 and HBA1c

## 2010-10-18 ENCOUNTER — Telehealth: Payer: Self-pay

## 2010-10-18 ENCOUNTER — Other Ambulatory Visit: Payer: Self-pay

## 2010-10-18 DIAGNOSIS — R7301 Impaired fasting glucose: Secondary | ICD-10-CM

## 2010-10-18 DIAGNOSIS — R5381 Other malaise: Secondary | ICD-10-CM

## 2010-10-18 DIAGNOSIS — R5383 Other fatigue: Secondary | ICD-10-CM

## 2010-10-18 NOTE — Telephone Encounter (Signed)
pls advise bilateral small nodules on the gland, NO MASS, no enlarged lymph nodes

## 2010-10-18 NOTE — Telephone Encounter (Signed)
Patient aware.

## 2010-10-18 NOTE — Telephone Encounter (Signed)
Ordered and faxed.

## 2010-10-22 ENCOUNTER — Telehealth: Payer: Self-pay | Admitting: Family Medicine

## 2010-10-22 DIAGNOSIS — E042 Nontoxic multinodular goiter: Secondary | ICD-10-CM

## 2010-10-22 NOTE — Telephone Encounter (Signed)
pls rewfer and try to set up appt as requested I am entering order

## 2010-10-22 NOTE — Telephone Encounter (Signed)
pls see response 

## 2010-10-23 ENCOUNTER — Encounter: Payer: Self-pay | Admitting: Family Medicine

## 2010-11-12 ENCOUNTER — Other Ambulatory Visit: Payer: Self-pay | Admitting: Family Medicine

## 2010-11-22 NOTE — Op Note (Signed)
NAMENAOMY, ESHAM                  ACCOUNT NO.:  [id number]   MEDICAL RECORD NO.:  [id number]          PATIENT TYPE:  INP   LOCATION:  9315                          FACILITY:  [facility]   PHYSICIAN:  Lesly Dukes, M.D. DATE OF BIRTH:  1971-03-20   DATE OF PROCEDURE:  12/13/2005  DATE OF DISCHARGE:                                 OPERATIVE REPORT   PREOPERATIVE DIAGNOSIS:  A 40 year old female with retained placenta.   POSTOPERATIVE DIAGNOSIS:  A 40 year old female with retained placenta.   PROCEDURE:  Examination under anesthesia and removal of placenta.   SURGEON:  Lesly Dukes, M.D.   ANESTHESIA:  MAC.   Placenta to pathology.   ESTIMATED BLOOD LOSS:  Minimal.   COMPLICATIONS:  None.   FINDINGS:  Retained anterior placenta.  No vaginal lacerations.  __________.   PROCEDURE:  Informed consent was obtained.  The patient was taken to the  operating room, where MAC anesthesia was found to be adequate.  The patient  was prepared and draped in the normal sterile fashion.  Exam under  anesthesia was performed and using uterine exploration, the placenta was  removed.  There was no bleeding present postoperatively and the fundus was  firm.  The patient tolerated the procedure well.  Sponge, lap, instrument  and needle count was correct x2, and the patient went to the recovery room  in stable condition.           ______________________________  Lesly Dukes, M.D.     KHL/MEDQ  D:  12/13/2005  T:  12/15/2005  Job:  [id number]

## 2010-11-22 NOTE — H&P (Signed)
NAMEPHYLIS, [name]                  ACCOUNT NO.:  [id number]   MEDICAL RECORD NO.:  [id number]          PATIENT TYPE:  AMB   LOCATION:  DAY                           FACILITY:  [facility]   PHYSICIAN:  Tilda Burrow, M.D. DATE OF BIRTH:  [birth year]   DATE OF ADMISSION:  08/14/2005  DATE OF DISCHARGE:  LH                                HISTORY & PHYSICAL   ADMITTING DIAGNOSES:  1.  Pregnancy [redacted] weeks gestation.  2.  Cervical competency.   HISTORY OF PRESENT ILLNESS:  This is a 40 year old female gravida 6, para 4,  AB 1, LMP May 07, 2005 placing menstrual [facility] February 11, 2006 is  admitted at 14-plus weeks gestation after pregnancy course notable through  our office for chronic hypertension currently treated with Aldomet 500 at  bedtime.  She has a history of cervical incompetency and has had McDonald  cerclage with her pregnancies.  She had cerclages for second two pregnancies  (at [redacted] weeks gestation, subsequent pregnancy is at 29 and [redacted] weeks  gestation).  Plans are for McDonald cerclage again.   PAST MEDICAL HISTORY:  Hypertension.   PAST SURGICAL HISTORY:  Tubal ligation many years ago, tubal reversal also  performed successfully.   ALLERGIES:  BIAXIN and ZYRTEC.   SOCIAL HISTORY:  Married, works at [employer] office.   GENERAL EXAMINATION:  Healthy-appearing, large-framed, African-American  female.  Height 5 feet 8 inches, estimate weight 170, blood pressure 126/74.  Urinalysis negative.  Cervical speculum exam reveals a large bulbous cervix  approximately 2 cm of cervix protruding into the vagina with cervical os  currently closed.  Old scarring from  prior cerclage placements is present.  Blood type is B positive.  Hemoglobin  11, hematocrit 37.  Hepatitis, HIV, RPR all negative.  GC and Chlamydia have  been performed and are negative.   PLAN:  McDonald cerclage on August 14, 2005.      Tilda Burrow, M.D.  Electronically Signed     JVF/MEDQ  D:   08/08/2005  T:  08/08/2005  Job:  [id number]   cc:   [facility]   [facility]  Fax: [phone]

## 2010-11-22 NOTE — H&P (Signed)
NAMELAVONNA, LAMPRON                  ACCOUNT NO.:  [id number]   MEDICAL RECORD NO.:  [id number]          PATIENT TYPE:  OBV   LOCATION:  A428                          FACILITY:  [facility]   PHYSICIAN:  Tilda Burrow, M.D. DATE OF BIRTH:  06-10-1971   DATE OF ADMISSION:  07/15/2005  DATE OF DISCHARGE:  LH                                HISTORY & PHYSICAL   REASON FOR ADMISSION:  Pregnancy at 39 weeks and 5 days with nausea,  vomiting, dehydration.   HISTORY OF PRESENT ILLNESS:  Cohen is a 40 year old black female gravida 6,  para 4 who is due August 8, who was doing well on Zofran therapy, but  insurance now does not pay for that, will not cover that, and since Friday,  she has been vomiting and cannot keep anything down.  Weight is down 3  pounds from 9 days ago.   PAST MEDICAL HISTORY:  Hypertension.   PAST SURGICAL HISTORY:  Positive for __________tubal reversal.   ALLERGIES:  BIAXIN, Z-PACK.   MEDICATIONS:  Aldomet 500 daily.   SOCIAL HISTORY:  She is married.  Her husband is present and supportive.   PHYSICAL EXAMINATION:  VITAL SIGNS:  Weight 171, blood pressure 130/70.  Urine is 1+ ketones.   PLAN:  We are going to admit for IV hydration, assist her electrolyte  balance, give her some IV Zofran, try a Transderm scope patch to see if that  would work since she is unable to get the Zofran and observe.      [name], [name]      Tilda Burrow, M.D.  Electronically Signed    DL/MEDQ  D:  40/04/2724  T:  07/15/2005  Job:  [id number]

## 2010-11-22 NOTE — H&P (Signed)
[name], [name]                  ACCOUNT NO.:  [id number]   MEDICAL RECORD NO.:  [id number]          PATIENT TYPE:  INP   LOCATION:  A428                          FACILITY:  [facility]   PHYSICIAN:  Tilda Burrow, M.D. DATE OF BIRTH:  1971-05-24   DATE OF ADMISSION:  07/28/2005  DATE OF DISCHARGE:  LH                                HISTORY & PHYSICAL   REASON FOR ADMISSION:  Pregnancy at 64 weeks' gestation with nausea,  vomiting, dehydration and pregnancy.   PAST MEDICAL HISTORY:  Hypertension.   PAST SURGICAL HISTORY:  Tubal reversal and tubal ligation.   ALLERGIES:  BIAXIN and ZANTAC.   MEDICATIONS:  Aldomet 500 mg daily.   PHYSICAL EXAMINATION:  VITAL SIGNS:  Weight 171 which is down from 174,  blood pressure 130/82.  She has positive ketones in her urine.  ABDOMEN:  Fetal heart rate is 150, strong and regular.   ASSESSMENT:  The patient has been unable to keep any fluids down for the  past 2 days.   PLAN:  We are going to admit, assess her electrolyte balance and hydrate  her.  Give her some antiemetics.      [name], [name]      Tilda Burrow, M.D.  Electronically Signed    DL/MEDQ  D:  16/04/9603  T:  07/28/2005  Job:  [id number]

## 2010-11-22 NOTE — Discharge Summary (Signed)
[name], [name]                  ACCOUNT NO.:  [id number]   MEDICAL RECORD NO.:  [id number]          PATIENT TYPE:  INP   LOCATION:  9315                          [facility]ACILITY:  [facility]   PHYSICIAN:  Ginger Carne, MD  DATE OF BIRTH:  1971/01/07   DATE OF ADMISSION:  12/07/2005  DATE OF DISCHARGE:  12/15/2005                                 DISCHARGE SUMMARY   REASON FOR ADMISSION:  Preterm premature rupture of membranes.   PROCEDURE:  1.  Cerclage removal.  2.  Betamethasone administration times two.  3.  Ampicillin and erythromycin administration.   PROCEDURES POSTPARTUM:  1.  Dilatation and curettage.  2.  Placenta removal.   COMPLICATIONS OPERATIVE AND POSTPARTUM:  None.   DISCHARGE DIAGNOSES:  1.  Preterm premature rupture of membranes.  2.  Incompetent cervix, status post McDonald's cerclage,  3.  Chronic hypertension.  4.  Retained placenta, status post dilatation and curettage.   BRIEF HOSPITAL COURSE:  The patient is a 40 year old, G6, P0-4-1-0, who  presented at 30-3/7 weeks as dated by first trimester ultrasound with  spontaneous rupture of membranes. The patient was a transfer from Dr. Christin Bach at [facility]. The patient was admitted, monitored, and  given prophylactic antibiotics and intravenous steroids as noted previously.  The patient had a McDonald cerclage in place, which was removed. The patient  was maintained primarily on ampicillin for prophylaxis as the patient had  allergy noted to ERYTHROMYCIN and AZITHROMYCIN. The patient was given  Aldomet for treatment of her chronic hypertension. Also of note was that the  patient is tubal re-anastomosis. In consultation with perinatology the  patient was expectantly managed. She was maintained on a Carb-modified diet,  per nutrition consultation. On December 13, 2005, hospital day #7, the patient  started having strong contractions. The patient delivered a viable female at  22:45 under epidural  anesthesia on December 13, 2005. The  vagina and cervix were  inspected and there were no lacerations. Apgar on the infant was 8 and 8.  The patient was noted to have persistent bleeding postpartum and was taken  to the operating suite for dilatation and curettage, and removal of retained  placental fragments. Estimated blood loss was 500 mL. Baby was taken to the  NICU, breathing on her own, and crying. Postoperative hemoglobin was noted  to be 7.6 on December 14, 2005, down from admission value of 10.3. The patient  had a routine postpartum course, and was discharged on  postpartum/postoperative day  #2. At the time of discharge the patient  remains in the NICU. The patient is blood type B+, rubella immune, and was  given Depo-Provera for contraception at discharge. The patient had signed  BTL papers. For the patient's hypertension, blood pressures were well  controlled on discharge and the patient was discharged for followup with her  primary care physician.   DISCHARGE INSTRUCTIONS:  Activity--unrestricted.  Diet--carbohydrate  modified.   MEDICATIONS:  Ibuprofen p.r.n. pain, iron sulfate for anemia, Colace, and  prenatal vitamins.   INSTRUCTIONS:  Routine. Discharge to home and  follow up in six weeks with  [facility] or with Dr. Emelda Fear.      [name], M.D.      Ginger Carne, MD  Electronically Signed    JP/MEDQ  D:  12/15/2005  T:  12/15/2005  Job:  [id number]

## 2010-11-22 NOTE — H&P (Signed)
[facility]  Patient:    [name], [name] Visit Number: [id number] MRN: [id number]          Service Type: OBS Location: 4A A425 01 Attending Physician:  Tilda Burrow Dictated by:   [name], N.M. Admit Date:  08/04/2001   CC:         [facility]   History and Physical  ADMITTING DIAGNOSIS:  Pregnancy at 34 weeks and 5 days with premature labor and cerclage.  HISTORY OF PRESENT ILLNESS:  [name] has been on oral tocolytics and has a cerclage in for preterm labor and incompetent cervix.  During the past several weeks, she has been contracting on and off.  Contractions have become, over the past several days, more regular.  Upon exam Monday in the office, cervix was 2 cm, 80% effaced and presenting part was at a 0 station with a bulging lower uterine segment.  The stitch was intact.  She was told at that time if regular contractions ensued despite Brethine therapy, she was to present to hospital.  She presented to the hospital this morning at 5:30 a.m. with regular contractions anywhere from two to seven minutes apart.  PAST MEDICAL HISTORY:  Positive for hypertension and preterm labor and incompetent cervix.  PAST SURGICAL HISTORY:  Surgical history is positive for wisdom tooth, tubal reversal, an ovarian cystectomy and a cerclage.  MEDICATIONS:  She is on blood pressure medicines, methyldopa 250 mg q.h.s. and prenatal vitamins.  SOCIAL HISTORY:  She is married.  Her husband is present and supportive.  FAMILY HISTORY:  Positive for hypertension.  PRENATAL COURSE:  Prenatal course has been complicated by preterm labor and surgical intervention with a cerclage.  She has been placed on Brethine 5 mg q.4h. for preterm labor.  She had her cerclage the end of October without any complications.  Blood type is B-positive.  UDS is negative.  Rubella is nonimmune.  Hepatitis B surface antigen negative.  HIV is negative.  Serology is  nonreactive.  AFP was normal.  Sickle cell screen was negative.  PHYSICAL EXAMINATION:  VITAL SIGNS:  Stable.  ABDOMEN:  Contractions are two to seven minutes apart.  Fetal heart rate pattern is 140s with accelerations and no decelerations.  PLAN:  Dr. Christin Bach and Dr. Duane Lope are consulted.  They have decided to remove her cerclage stitch and let her labor.  Also, the pediatrician, Dr. Christa See, is aware and she plans to be present for the delivery. Dictated by:   [name], N.M. Attending Physician:  Tilda Burrow DD:  08/04/01 TD:  08/04/01 Job: [id number]

## 2010-11-22 NOTE — Op Note (Signed)
[facility]  Patient:    [name], [name] Visit Number: [id number] MRN: [id number]          Service Type: OBS Location: 4A A425 01 Attending Physician:  Tilda Burrow Dictated by:   [name], CNM Proc. Date: 08/04/01 Admit Date:  08/04/2001   CC:         Dr. Gabriel Rung  [facility]   Operative Report  ONSET OF LABOR:  08/04/01 at 3 a.m.  DATE OF DELIVERY:  08/04/01 at 2146 hours.  LABOR:  Length of first stage labor was 18 hours and 35 minutes, length of second stage of labor 11 minutes, length of third stage of labor 29 minutes.  DELIVERY NOTE:  After slow progress due to cervical scarring from cerclage stitch, [name] progressed to complete dilatation with assistance of Dr. Despina Hidden, with pushing.  Variable decelerations were noted but good recovery and variability was present.  Dr. Gabriel Rung was called for the delivery due to the fact that the patient was 34 weeks and 5 days gestation, and had asked earlier to be in attendance for the delivery.  So at the time the patient became complete, Dr. Gabriel Rung was notified and made aware that she was complete and starting to push.  The patient pushed approximately 10-11 minutes with spontaneous delivery of the head without any difficulty.   A loop of cord was noted at that time up by the shoulders.  Was not around the neck though.  The anterior shoulder was delivered without any difficulty.  Posterior shoulder then delivered.  Infant was delivered spontaneously without any difficulty. Good vigorous cry was noted at birth.  Infant was dried and suctioned.  Cord clamped and infant placed in the warmer.  Infant was noted at time of cord clamping to have a strong cry, good tone and good color.  Dr. Gabriel Rung was in attendance.  The infant was transferred over to the warmer and to her care.  Upon inspection the perineum was noted to be intact.  Third stage of labor was actively managed with 20 units of Pitocin in 1000 cc of  D-5 lactated Ringers and placenta was delivered approximately 29 minutes after delivery without difficulty.  A three-vessel cord was noted.  It was delivered via Tomasa Blase mechanism with membranes intact.  Estimated blood loss was approximately 350 cc.  Good hemostasis was obtained.  Hemabate 125 cc IM was given prophylactically due to Pitocin from earlier in the day and the patients parity.  The patient was stabilized and transferred out to the postpartum unit in good condition. Dictated by:   [name], CNM Attending Physician:  Tilda Burrow DD:  08/05/01 TD:  08/05/01 Job: [id number]

## 2010-11-22 NOTE — Op Note (Signed)
NAMESHARMAN, GARROTT                  ACCOUNT NO.:  [id number]   MEDICAL RECORD NO.:  [id number]          PATIENT TYPE:  OBV   LOCATION:  A426                          FACILITY:  [facility]   PHYSICIAN:  Tilda Burrow, M.D. DATE OF BIRTH:  1971-02-27   DATE OF PROCEDURE:  DATE OF DISCHARGE:  08/15/2005                                 OPERATIVE REPORT   PREOPERATIVE DIAGNOSES:  1.  Pregnancy 14 weeks' gestation.  2.  Cervical incompetency.   POSTOPERATIVE DIAGNOSES:  1.  Pregnancy 14 weeks' gestation.  2.  Cervical incompetency.   PROCEDURE:  McDonald's cerclage.   SURGEON:  Tilda Burrow, M.D.   ASSISTANTAnnabell Howells, R.N.   ANESTHESIA:  General.   COMPLICATIONS:  Initial suture required to be removed due to position too  close to cervical os.   DETAILS OF PROCEDURE:  The patient was taken to the operating room, prepped  and draped for vaginal procedure with high lithotomy leg supports in candy-  cane stirrups. The weighted speculum was placed in the vagina and lateral  retractor allowed visualization of the cervix. Since she had had two prior  cerclages, the puncture sites that had worked for the two prior cerclages  were easily identifiable. We used those as landmarks for the procedure. The  cervix had some eversion as if she was status post conization. We were able  to retract inferiorly on the cervix. The endocervical tissues were quite  vascular, and bleeding did occur throughout the procedure. A circumferential  0 Prolene suture was placed using the previous puncture sites. The cervix  was noted to be quite broad, relatively short and the vaginal wall tissues  had become lax with her multiparity. After the circumferential Prolene  stitch was placed, it was then tied down. Interestingly, the vaginal mucosa  inverted such that the suture had an essentially pursestring effect below  the cervix rather than around the cervix. This was inspected and felt to be  in under  satisfactory positioning. We grasped the cervical edge again with  Allis clamp, and we could perceive that there was plenty of firm, fibrous  tissue but it was beneath the very lax mobile vaginal mucosa. Therefore, we  proceeded with replacement of the cerclage stitch, this time being sure to  suture deeper into the tissue to ensure that we caught the actual cervical  tissues themselves. This pursestring suture resulted in a firmer  approximation and did not invert when it was tied down. Procedure was  considered satisfactorily completed. Estimated blood loss 50 cc or less. The  patient had easy hemostasis at the end of the procedure and went to recovery  room.   ADDENDUM:  During the recovery time, the patient did excellent. She was  stable and was getting ready to go home. Upon taking her to her car with  absolutely no pain complaints prior to that episode, she became slightly  lightheaded and vomiting. The downward pressure vomiting resulted in  abdominal discomfort which was surprising in its severity to the patient. We  therefore chose to observe her overnight.  Blood type is Rh positive by old  records.      Tilda Burrow, M.D.  Electronically Signed     JVF/MEDQ  D:  08/14/2005  T:  08/15/2005  Job:  [id number]

## 2010-11-22 NOTE — Discharge Summary (Signed)
NAMETEELA, NARDUCCI                  ACCOUNT NO.:  [id number]   MEDICAL RECORD NO.:  [id number]          PATIENT TYPE:  OBV   LOCATION:  A428                          FACILITY:  [facility]   PHYSICIAN:  Tilda Burrow, M.D. DATE OF BIRTH:  December 17, 1970   DATE OF ADMISSION:  07/15/2005  DATE OF DISCHARGE:  01/09/2007LH                                 DISCHARGE SUMMARY   OBSERVATION COURSE:  A 40 year old female referred over for a 4 pound weight  loss at 9-5/7 weeks' gestation for hyperemesis.  She is a G6, P4, AB 1 with  four living children whose LMP was May 07, 2005, placing her at 9-5/7  weeks.  She has lost 3 pounds.  She has 1+ ketones.  Urinalysis obtained  upon arrival shows 1.025 specific gravity, negative ketones, negative  protein and no evidence of urinary tract infection.  Hemoglobin is 11.9,  hematocrit 35 with BUN 8 and creatinine 0.9.  The patient has Phenergan  suppositories at home.  Given her overall health and her interest in being  at home, we will simply cancel any plans for fluid hydration and send her  home at this time.  She will continue to use the Phenergan suppositories  using half suppository at a time to reduce the sedation effect.  She is also  given Reglan 10 mg tablets to take a.c., 30 tablets with 2 refills and  Phenergan tablets 30 tablets to take one every 6 hours p.r.n. nausea after  the suppositories have dulled her initial nausea.      Tilda Burrow, M.D.  Electronically Signed     JVF/MEDQ  D:  07/15/2005  T:  07/15/2005  Job:  [id number]

## 2010-11-22 NOTE — H&P (Signed)
[name], [name]                  ACCOUNT NO.:  [id number]   MEDICAL RECORD NO.:  [id number]          PATIENT TYPE:  OIB   LOCATION:  A415                          FACILITY:  [facility]   PHYSICIAN:  Tilda Burrow, M.D. DATE OF BIRTH:  06-10-1971   DATE OF ADMISSION:  12/07/2005  DATE OF DISCHARGE:  06/03/2007LH                                HISTORY & PHYSICAL   ADMITTING DIAGNOSIS:  Pregnancy 30 weeks 4 day, premature pre-term rupture  of membranes 4:30 a.m. on December 06, 2005.  A single stitch McDonald cerclage  in place for a history of recurrent preterm labor secondary to presumed  cervical incontinency.   HISTORY OF PRESENT ILLNESS:  This 40 year old female, gravida 6, para 4, AB  1, LMP May 07, 2005, placing menstrual [facility] February 11, 2006, with  corresponding ultrasound 7 weeks 2 days on December 29th suggesting August  15th [facility], and ultrasound 18+3 weeks on September 18, 2005, suggesting February 15, 2006.  By menstrual criteria, she is now 30 weeks 4 days.  Prenatal course  has been notable for a cerclage placed at 14 weeks single stitch as well as  chronic hypertension.  She has been managed on Aldomet 500 mg.  She has been  seen once for preterm labor with ultrasound at that time showing a normal  fluid volume, an excellent biophysical profile with May 15th ultrasound  showing consistent fetal growth.   PAST MEDICAL HISTORY:  Positive for chronic hypertension.   PAST SURGICAL HISTORY:  1.  Tubal ligation and subsequent tubal reversal.  2.  McDonald cerclage through all pregnancies except the first one.  Prior      preterm delivery managed at [facility] in 1991.   ALLERGIES:  None known.   PHYSICAL EXAMINATION:  GENERAL:  A healthy, calm, African-American female,  who appears her stated age, height 5 feet 10, weight 176.  VITAL SIGNS:  Blood pressure 120/70, pulse 90s.  HEENT:  Pupils round and reactive.  CARDIOVASCULAR:  Unremarkable.  ABDOMINAL EXAM:  A 30 cm fundal  height down from 31 to 32 weeks last week.  Speculum exam not done.  Group-B Strep in lower vagina performed.  The  patient had a confirmed vertex presentation on May 30th.  Fetal heart  monitoring shows excellent reactivity with no decelerations and shows only  two extremely mild contractions about five minutes apart on prolonged  monitoring.   IMPRESSION:  Premature rupture of membranes, 30+4 weeks.   PLAN:  Magnesium sulfate, tocolysis, Group-B Strep culture, ampicillin 2 gm  IV q.6h, erythromycin q.50mg  IV q.6h.  Will arrange transfer to [facility] for subsequent care of infant.      Tilda Burrow, M.D.  Electronically Signed     JVF/MEDQ  D:  12/07/2005  T:  12/07/2005  Job:  [id number]

## 2010-11-22 NOTE — H&P (Signed)
[facility]  Patient:    [name], [name] Visit Number: [id number] MRN: [id number]          Service Type: Attending:  Christin Bach, M.D. Dictated by:   Christin Bach, M.D. Adm. Date:  04/23/01                           History and Physical  ADMITTING DIAGNOSES: 1. Pregnancy, [redacted] weeks gestation. 2. History of cervical incompetency.  HISTORY OF PRESENT ILLNESS:  This 40 year old female, gravida 5, para 3, Ab1, with a history of progressively earlier and earlier deliveries, with a presumed diagnosis of cervical incompetency made based on her 28-week delivery, which was her second child.  She subsequently has had the third pregnancy complicated by encephalocele; subsequent pregnancy went well with a normal delivery.  She then is admitted at this time for placement of McDonald cerclage.  Patient understands the rationale for the cerclage and has had technical aspects of the procedure including potential complications including intraoperative rupture of membranes or preterm labor associated with the procedure itself.  Patient understands these inherent risks and wishes to proceed.  PAST MEDICAL HISTORY:  Positive for hypertension.  SURGICAL HISTORY:  Wisdom teeth extraction; tubal reversal, 2001; McDonald cerclage x 2; cold-knife conization of the cervix.  ALLERGIES:  POLLEN.  MEDICATIONS:  Aldomet 250 mg p.o. q.h.s.  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  PHYSICAL EXAMINATION:  GENERAL:  Height 5 feet 6 inches.  Weight 164 pounds.  VITAL SIGNS:  Blood pressure 125/70.  HEENT:  Pupils equal and reactive.  Extraocular movements intact.  NECK:  Supple.  Trachea midline.  CHEST:  Clear to auscultation.  ABDOMEN:  Nontender.  PELVIC:  Cervix multiparous.  Uterus anterior, 22-cm fundal height.  Recent level 2 ultrasound at [facility] confirms normalcy of this fetus.  OBSTETRICAL HISTORY:  OB history  notable for progressive preterm deliveries and encephalocele with baby #3.  PLAN:  McDonald cerclage, April 23, 2001. Dictated by:   Christin Bach, M.D. Attending:  Christin Bach, M.D. DD:  04/20/01 TD:  04/20/01 Job: [id number]

## 2010-11-22 NOTE — H&P (Signed)
NAMEERCEL, NORMOYLE                  ACCOUNT NO.:  [id number]   MEDICAL RECORD NO.:  [id number]          PATIENT TYPE:  OIB   LOCATION:  LDR1                          FACILITY:  [facility]   PHYSICIAN:  Tilda Burrow, M.D. DATE OF BIRTH:  29-May-1971   DATE OF ADMISSION:  11/17/2005  DATE OF DISCHARGE:  LH                                HISTORY & PHYSICAL   REASON FOR ADMISSION:  Pregnancy at 27 weeks with uterine contractions.   HISTORY OF PRESENT ILLNESS:  Vanilla called the office this morning to state  she had been having contractions on and off for several days, which has  gotten worse today.  She was encouraged to go to labor and delivery.  She  presented having some irregular contractions, to which she responded well to  with some subcutaneous terbutaline and p.o. terbutaline to follow.  She has  done well.  She had a biophysical profile, which was within normal limits.  Fetal heart rate pattern is stable.  No uterine contractions.  The patient  is feeling better now.  We are going to discharge her home on pelvic rest  and p.o. terbutaline 5 mg q.4h. p.r.n. contractions, and she is to follow up  with Dr. Emelda Fear on Friday, and she knows if these contractions resume, she  is to report back to the hospital.      [name], N.M.      Tilda Burrow, M.D.  Electronically Signed    DL/MEDQ  D:  16/04/9603  T:  11/17/2005  Job:  [id number]   cc:   [facility]

## 2010-11-22 NOTE — Op Note (Signed)
[facility]  Patient:    [name], [name] Visit Number: [id number] MRN: [id number]          Service Type: OBS Location: 4A A428 01 Attending Physician:  Tilda Burrow Dictated by:   Christin Bach, M.D. Proc. Date: 04/23/01 Admit Date:  04/23/2001 Discharge Date: 04/23/2001                             Operative Report  PREOPERATIVE DIAGNOSES: 1. Pregnancy at [redacted] weeks gestation. 2. History of cervical incompetency. 3. Status post laser conization of the cervix.  POSTOPERATIVE DIAGNOSES: 1. Pregnancy at [redacted] weeks gestation. 2. History of cervical incompetency. 3. Status post laser conization of the cervix.  PROCEDURE:  McDonald cerclage, two-stitch technique.  SURGEON:  Christin Bach, M.D.  ASSISTANTEarlene Plater.  ANESTHESIA:  General.  COMPLICATIONS:  None.  FINDINGS:  Marked vaginal length making for a technically challenging procedure.  ESTIMATED BLOOD LOSS:  Less than 10 cc.  DETAILS OF PROCEDURE:  The patient was taken to the operating room, prepped and draped for a vaginal procedure with spinal anesthesia introduced and allowed to control pain at a very low level.  The patient was left in the sitting position to obtain a sacral dermatome.  The patient was then placed in the low lithotomy support position, a speculum inserted and the cervix visualized.  The cervix was grasped with a minimally traumatic grasping instrument, the Russian forceps and then a 0 Prolene suture placed in a pursestring fashion in the tissue of the cervix, being careful to avoid adjacent structures.  This was tied down loosely with good tissue response.  A second pursestring suture was placed slightly above the first, again encircling the cervix and lower uterine segment with good results.  Both sutures were tied down and situated to restrict opening of the cervix.  The patient was then allowed to go to the recovery room in good condition for analgesia to wear  off. Dictated by:   Christin Bach, M.D. Attending Physician:  Tilda Burrow DD:  05/23/01 TD:  05/24/01 Job: [id number]

## 2010-12-11 ENCOUNTER — Telehealth: Payer: Self-pay | Admitting: Family Medicine

## 2010-12-11 ENCOUNTER — Emergency Department (HOSPITAL_COMMUNITY)
Admission: EM | Admit: 2010-12-11 | Discharge: 2010-12-11 | Disposition: A | Discharge status: Home / Self Care | Payer: Private Health Insurance - Indemnity | Attending: Emergency Medicine | Admitting: Emergency Medicine

## 2010-12-11 ENCOUNTER — Emergency Department (HOSPITAL_COMMUNITY): Payer: Private Health Insurance - Indemnity

## 2010-12-11 DIAGNOSIS — I4891 Unspecified atrial fibrillation: Secondary | ICD-10-CM | POA: Insufficient documentation

## 2010-12-11 DIAGNOSIS — I10 Essential (primary) hypertension: Secondary | ICD-10-CM | POA: Insufficient documentation

## 2010-12-11 DIAGNOSIS — K5289 Other specified noninfective gastroenteritis and colitis: Secondary | ICD-10-CM | POA: Insufficient documentation

## 2010-12-11 DIAGNOSIS — Z79899 Other long term (current) drug therapy: Secondary | ICD-10-CM | POA: Insufficient documentation

## 2010-12-11 LAB — COMPREHENSIVE METABOLIC PANEL
BUN: 9 mg/dL (ref 6–23)
CO2: 30 mEq/L (ref 19–32)
Calcium: 9.8 mg/dL (ref 8.4–10.5)
Creatinine, Ser: 0.84 mg/dL (ref 0.4–1.2)
GFR calc non Af Amer: >60 mL/min (ref >60)
Glucose, Bld: 84 mg/dL (ref 70–99)
Sodium: 137 mEq/L (ref 135–145)
Total Protein: 7.5 g/dL (ref 6.0–8.3)

## 2010-12-11 LAB — CBC
HCT: 40.1 % (ref 36.0–46.0)
MCV: 70 fL — ABNORMAL LOW (ref 78.0–100.0)
RBC: 5.73 MIL/uL — ABNORMAL HIGH (ref 3.87–5.11)
RDW: 14.7 % (ref 11.5–15.5)
WBC: 8.4 10*3/uL (ref 4.0–10.5)

## 2010-12-11 LAB — PREGNANCY, URINE: Preg Test, Ur: NEGATIVE

## 2010-12-11 LAB — DIFFERENTIAL
Basophils Absolute: 0 10*3/uL (ref 0.0–0.1)
Eosinophils Relative: 2 % (ref 0–5)
Lymphocytes Relative: 15 % (ref 12–46)
Lymphs Abs: 1.3 10*3/uL (ref 0.7–4.0)
Neutro Abs: 6.4 10*3/uL (ref 1.7–7.7)
Neutrophils Relative %: 75 % (ref 43–77)

## 2010-12-11 LAB — URINALYSIS, ROUTINE W REFLEX MICROSCOPIC
Bilirubin Urine: NEGATIVE
Hgb urine dipstick: NEGATIVE
Nitrite: NEGATIVE
Protein, ur: NEGATIVE mg/dL
Specific Gravity, Urine: 1.02 (ref 1.005–1.030)
Urobilinogen, UA: 1 mg/dL (ref 0.0–1.0)

## 2010-12-11 LAB — LIPASE, BLOOD: Lipase: 62 U/L — ABNORMAL HIGH (ref 11–59)

## 2010-12-11 NOTE — Telephone Encounter (Signed)
Carollee Herter has advised this patient

## 2010-12-16 ENCOUNTER — Encounter: Payer: Self-pay | Admitting: Family Medicine

## 2010-12-18 ENCOUNTER — Emergency Department (HOSPITAL_COMMUNITY)
Admission: EM | Admit: 2010-12-18 | Discharge: 2010-12-18 | Disposition: A | Discharge status: Home / Self Care | Payer: Private Health Insurance - Indemnity | Attending: Emergency Medicine | Admitting: Emergency Medicine

## 2010-12-18 ENCOUNTER — Emergency Department (HOSPITAL_COMMUNITY): Payer: Private Health Insurance - Indemnity

## 2010-12-18 DIAGNOSIS — S6990XA Unspecified injury of unspecified wrist, hand and finger(s), initial encounter: Secondary | ICD-10-CM | POA: Insufficient documentation

## 2010-12-19 ENCOUNTER — Encounter: Payer: Self-pay | Admitting: Family Medicine

## 2010-12-19 ENCOUNTER — Telehealth: Payer: Self-pay | Admitting: Family Medicine

## 2010-12-19 ENCOUNTER — Other Ambulatory Visit: Payer: Self-pay | Admitting: Family Medicine

## 2010-12-19 MED ORDER — TRAMADOL HCL 50 MG PO TABS: 50.0000 mg | ORAL_TABLET | Freq: Four times a day (QID) | ORAL | Status: DC | PRN

## 2010-12-19 NOTE — Telephone Encounter (Signed)
Med has been sent in  

## 2010-12-21 ENCOUNTER — Emergency Department (HOSPITAL_COMMUNITY)
Admission: EM | Admit: 2010-12-21 | Discharge: 2010-12-21 | Disposition: A | Discharge status: Home / Self Care | Payer: Private Health Insurance - Indemnity | Attending: Emergency Medicine | Admitting: Emergency Medicine

## 2010-12-21 DIAGNOSIS — W230XXA Caught, crushed, jammed, or pinched between moving objects, initial encounter: Secondary | ICD-10-CM | POA: Insufficient documentation

## 2010-12-21 DIAGNOSIS — S6980XA Other specified injuries of unspecified wrist, hand and finger(s), initial encounter: Secondary | ICD-10-CM | POA: Insufficient documentation

## 2010-12-21 DIAGNOSIS — S6990XA Unspecified injury of unspecified wrist, hand and finger(s), initial encounter: Secondary | ICD-10-CM | POA: Insufficient documentation

## 2010-12-21 DIAGNOSIS — I4891 Unspecified atrial fibrillation: Secondary | ICD-10-CM | POA: Insufficient documentation

## 2010-12-21 DIAGNOSIS — I10 Essential (primary) hypertension: Secondary | ICD-10-CM | POA: Insufficient documentation

## 2010-12-21 DIAGNOSIS — S6000XA Contusion of unspecified finger without damage to nail, initial encounter: Secondary | ICD-10-CM | POA: Insufficient documentation

## 2010-12-23 ENCOUNTER — Ambulatory Visit (INDEPENDENT_AMBULATORY_CARE_PROVIDER_SITE_OTHER): Payer: Private Health Insurance - Indemnity | Admitting: Family Medicine

## 2010-12-23 ENCOUNTER — Encounter: Payer: Self-pay | Admitting: *Deleted

## 2010-12-23 ENCOUNTER — Encounter: Payer: Self-pay | Admitting: Family Medicine

## 2010-12-23 VITALS — BP 112/82 | HR 65 | Resp 16 | Ht 70.0 in | Wt 167.1 lb

## 2010-12-23 DIAGNOSIS — IMO0001 Reserved for inherently not codable concepts without codable children: Secondary | ICD-10-CM | POA: Insufficient documentation

## 2010-12-23 DIAGNOSIS — M79609 Pain in unspecified limb: Secondary | ICD-10-CM

## 2010-12-23 DIAGNOSIS — E669 Obesity, unspecified: Secondary | ICD-10-CM

## 2010-12-23 DIAGNOSIS — M79644 Pain in right finger(s): Secondary | ICD-10-CM

## 2010-12-23 DIAGNOSIS — E663 Overweight: Secondary | ICD-10-CM

## 2010-12-23 DIAGNOSIS — I10 Essential (primary) hypertension: Secondary | ICD-10-CM

## 2010-12-23 DIAGNOSIS — Z309 Encounter for contraceptive management, unspecified: Secondary | ICD-10-CM

## 2010-12-23 MED ORDER — METOPROLOL TARTRATE 25 MG PO TABS: 25.0000 mg | ORAL_TABLET | Freq: Every day | ORAL | Status: DC

## 2010-12-23 MED ORDER — POTASSIUM CHLORIDE CRYS ER 20 MEQ PO TBCR: 20.0000 meq | EXTENDED_RELEASE_TABLET | Freq: Three times a day (TID) | ORAL | Status: DC

## 2010-12-23 MED ORDER — DILTIAZEM HCL ER COATED BEADS 120 MG PO CP24: 120.0000 mg | ORAL_CAPSULE | Freq: Every day | ORAL | Status: DC

## 2010-12-23 MED ORDER — NORETHIN-ETH ESTRAD-FE BIPHAS 1 MG-10 MCG / 10 MCG PO TABS: 1.0000 | ORAL_TABLET | Freq: Every day | ORAL | Status: DC

## 2010-12-23 NOTE — Progress Notes (Signed)
  Subjective:    Patient ID: [name], female    DOB: June 29, 1971, 40 y.o.   MRN: [id number]  HPI Pt accidentally slammed her hand in her new car door on 12/18/2010. She has had increased pain and swelling  Of the finger, has been to the Ed twice first the nigh it occurred , then 2 days later whensymptoms worsened. Holes have been drilled in th nail, but relief is not yet present.Stiffness, pain,which radiates to right thumb base which is also swollen. Index finger is black and blue almost to the MP joint Has antibiotic prescription which she has not filled Pt was able to work hald day Friday only and has not been back since due to pain, uses key board on the job.Also has to pick  Up the phone. Also wants contraception stopped depo, less hair loss and weight gain.  Has upcoming thyroid nodule biopsy due to familyh/o thyroid cancer     Review of Systems Denies recent fever or chills. Denies sinus pressure, nasal congestion, ear pain or sore throat. Denies chest congestion, productive cough or wheezing. Denies chest pains, palpitations, paroxysmal nocturnal dyspnea, orthopnea and leg swelling Denies abdominal pain, nausea, vomiting,diarrhea or constipation.  Denies rectal bleeding or change in bowel movement. Denies dysuria, frequency, hesitancy or incontinence.  Denies headaches, seizure, numbness, or tingling. Denies depression, anxiety or insomnia. Denies skin break down or rash.        Objective:   Physical Exam Patient alert and oriented and in no Cardiopulmonary distress.  HEENT: No facial asymmetry, EOMI, no sinus tenderness, TM's clear, Oropharynx pink and moist.  Neck supple no adenopathy.  Chest: Clear to auscultation bilaterally.  CVS: S1, S2 no murmurs, no S3.  ABD: Soft non tender. Bowel sounds normal.  Ext: No edema  MS: Adequate ROM spine, shoulders, hips and knees.Decreased ROM index finger with obvious swelling and bruising to mP joint, the thenar eminence is  also swollen  Skin: Intact, no ulcerations or rash noted.  Psych: Good eye contact, normal affect. Memory intact not anxious or depressed appearing.  CNS: CN 2-12 intact, power, tone and sensation normal throughout.        Assessment & Plan:

## 2010-12-23 NOTE — Assessment & Plan Note (Signed)
Urine preg test checked negative, loestrin prescribe x 1 year per pt request

## 2010-12-23 NOTE — Patient Instructions (Signed)
F/u in 4 months.  You are referrd  to a Hydrographic surveyor.  You will be taken out of work.  HBA1C and chem 7 today, loestrin sent in per your request

## 2010-12-23 NOTE — Assessment & Plan Note (Signed)
Improved. Pt applauded on succesful weight loss through lifestyle change, and encouraged to continue same. Weight loss goal set for the next several months.  

## 2010-12-23 NOTE — Assessment & Plan Note (Signed)
Acute secondary to trauma, limiting ability to work, needs f/u with hand ortho, also work excuse written

## 2010-12-23 NOTE — Assessment & Plan Note (Signed)
Controlled, no change in medication  

## 2011-01-13 ENCOUNTER — Other Ambulatory Visit: Payer: Self-pay | Admitting: Family Medicine

## 2011-01-16 ENCOUNTER — Other Ambulatory Visit: Payer: Self-pay | Admitting: *Deleted

## 2011-01-16 MED ORDER — FLUCONAZOLE 150 MG PO TABS: 150.0000 mg | ORAL_TABLET | Freq: Once | ORAL | Status: AC

## 2011-03-26 ENCOUNTER — Encounter: Payer: Self-pay | Admitting: Family Medicine

## 2011-03-26 ENCOUNTER — Ambulatory Visit (INDEPENDENT_AMBULATORY_CARE_PROVIDER_SITE_OTHER): Payer: Private Health Insurance - Indemnity | Admitting: Family Medicine

## 2011-03-26 VITALS — BP 122/82 | HR 71 | Temp 98.9°F | Resp 16 | Ht 70.0 in | Wt 163.1 lb

## 2011-03-26 DIAGNOSIS — N3000 Acute cystitis without hematuria: Secondary | ICD-10-CM

## 2011-03-26 DIAGNOSIS — E669 Obesity, unspecified: Secondary | ICD-10-CM

## 2011-03-26 DIAGNOSIS — R7301 Impaired fasting glucose: Secondary | ICD-10-CM

## 2011-03-26 DIAGNOSIS — I10 Essential (primary) hypertension: Secondary | ICD-10-CM

## 2011-03-26 DIAGNOSIS — E041 Nontoxic single thyroid nodule: Secondary | ICD-10-CM

## 2011-03-26 DIAGNOSIS — R350 Frequency of micturition: Secondary | ICD-10-CM

## 2011-03-26 DIAGNOSIS — J0100 Acute maxillary sinusitis, unspecified: Secondary | ICD-10-CM

## 2011-03-26 LAB — POCT URINALYSIS DIPSTICK
Bilirubin, UA: NEGATIVE
Glucose, UA: NEGATIVE
Nitrite, UA: NEGATIVE
Spec Grav, UA: 1.025
Urobilinogen, UA: 0.2

## 2011-03-26 MED ORDER — SULFAMETHOXAZOLE-TRIMETHOPRIM 800-160 MG PO TABS: 1.0000 | ORAL_TABLET | Freq: Two times a day (BID) | ORAL | Status: AC

## 2011-03-26 MED ORDER — FLUCONAZOLE 150 MG PO TABS: ORAL_TABLET | ORAL | Status: DC

## 2011-03-26 NOTE — Patient Instructions (Addendum)
F/U in  February.   Chem 7 today  And HBA1C today   You are being treated for sinusitis    Fasting lipids and chem 7  It is important that you exercise regularly at least 30 minutes 5 times a week. If you develop chest pain, have severe difficulty breathing, or feel very tired, stop exercising immediately and seek medical attention   A healthy diet is rich in fruit, vegetables and whole grains. Poultry fish, nuts and beans are a healthy choice for protein rather then red meat. A low sodium diet and drinking 64 ounces of water daily is generally recommended. Oils and sweet should be limited. Carbohydrates especially for those who are diabetic or overweight, should be limited to 30-45 gram per meal. It is important to eat on a regular schedule, at least 3 times daily. Snacks should be primarily fruits, vegetables or nuts.

## 2011-04-06 NOTE — Assessment & Plan Note (Signed)
Controlled, no change in medication  

## 2011-04-06 NOTE — Assessment & Plan Note (Signed)
Being followed by endo

## 2011-04-06 NOTE — Assessment & Plan Note (Signed)
Antibiotic course prescribed 

## 2011-04-06 NOTE — Progress Notes (Signed)
  Subjective:    Patient ID: [name], female    DOB: 04-23-1971, 40 y.o.   MRN: [id number]  HPI 2 day h/o maxillary pressure causing headache, as well as increased post nasal drainage , and bilateral ear pain. Denies sore throat or productive cough. Denies fever , but has had chills. Also c/o urinary frequency and pressure with mild dysuria and flank pain , for the past 3 days. Has started regular physical activity, and is focussed on healthy eating to promote weight loss with success.Thinks thay depo provera had contributed significantly to weight gain.    Review of Systems See HPI  Denies chest pains, palpitations and leg swelling Denies abdominal pain, nausea, vomiting,diarrhea or constipation.   Denies  hesitancy or incontinence. Denies joint pain, swelling and limitation in mobility. Denies  seizures, numbness, or tingling. Denies depression, anxiety or insomnia. Denies skin break down or rash.        Objective:   Physical Exam Patient alert and oriented and in no cardiopulmonary distress.  HEENT: No facial asymmetry, EOMI, maxillary  sinus tenderness,  oropharynx pink and moist.  Neck supple no adenopathy.  Chest: Clear to auscultation bilaterally.  CVS: S1, S2 no murmurs, no S3.  ABD: Soft non tender. Bowel sounds normal.  Ext: No edema  MS: Adequate ROM spine, shoulders, hips and knees.  Skin: Intact, no ulcerations or rash noted.  Psych: Good eye contact, normal affect. Memory intact not anxious or depressed appearing.  CNS: CN 2-12 intact, power, tone and sensation normal throughout.        Assessment & Plan:

## 2011-04-06 NOTE — Assessment & Plan Note (Signed)
Urinalysis shows no signs  Of infection, pt reassured

## 2011-04-21 ENCOUNTER — Other Ambulatory Visit: Payer: Self-pay | Admitting: Family Medicine

## 2011-05-14 ENCOUNTER — Other Ambulatory Visit: Payer: Self-pay | Admitting: Family Medicine

## 2011-05-14 DIAGNOSIS — Z139 Encounter for screening, unspecified: Secondary | ICD-10-CM

## 2011-05-19 ENCOUNTER — Ambulatory Visit (HOSPITAL_COMMUNITY)
Admission: RE | Admit: 2011-05-19 | Discharge: 2011-05-19 | Disposition: A | Discharge status: Home / Self Care | Payer: Private Health Insurance - Indemnity | Source: Ambulatory Visit | Attending: Family Medicine | Admitting: Family Medicine

## 2011-05-19 ENCOUNTER — Telehealth: Payer: Self-pay | Admitting: Family Medicine

## 2011-05-19 DIAGNOSIS — Z139 Encounter for screening, unspecified: Secondary | ICD-10-CM

## 2011-05-19 DIAGNOSIS — Z1231 Encounter for screening mammogram for malignant neoplasm of breast: Secondary | ICD-10-CM | POA: Insufficient documentation

## 2011-05-19 NOTE — Telephone Encounter (Signed)
Called on 11/10 c/o head congestion with nasal stuffiness, no fever or chills.

## 2011-05-19 NOTE — Telephone Encounter (Signed)
Called on 11/10 with co head and nasal congestion no core throat , fever , chills or productive cough, advised OTC decongestants, salinmne nasal spray , use of allergy med and fluids. Advised if worsened, fever or chills needs clinical eval

## 2011-05-20 ENCOUNTER — Telehealth: Payer: Self-pay | Admitting: Family Medicine

## 2011-05-21 NOTE — Telephone Encounter (Signed)
Called pt results not back yet

## 2011-05-23 NOTE — Telephone Encounter (Signed)
Patient aware.

## 2011-05-23 NOTE — Telephone Encounter (Signed)
Patient has called again wanting to know if her results came back on her mammogram

## 2011-05-31 ENCOUNTER — Other Ambulatory Visit: Payer: Self-pay | Admitting: Family Medicine

## 2011-06-03 MED ORDER — CETIRIZINE HCL 10 MG PO TABS: ORAL_TABLET | ORAL | Status: DC

## 2011-06-15 ENCOUNTER — Other Ambulatory Visit: Payer: Self-pay | Admitting: Family Medicine

## 2011-06-22 ENCOUNTER — Other Ambulatory Visit: Payer: Self-pay | Admitting: Family Medicine

## 2011-07-18 ENCOUNTER — Encounter: Payer: Self-pay | Admitting: Family Medicine

## 2011-07-18 ENCOUNTER — Ambulatory Visit: Payer: Private Health Insurance - Indemnity | Admitting: Family Medicine

## 2011-07-18 ENCOUNTER — Ambulatory Visit (INDEPENDENT_AMBULATORY_CARE_PROVIDER_SITE_OTHER): Payer: Self-pay | Admitting: Family Medicine

## 2011-07-18 VITALS — BP 124/76 | HR 64 | Resp 18 | Ht 67.0 in | Wt 162.1 lb

## 2011-07-18 DIAGNOSIS — I10 Essential (primary) hypertension: Secondary | ICD-10-CM

## 2011-07-18 MED ORDER — DILTIAZEM HCL ER COATED BEADS 120 MG PO CP24: ORAL_CAPSULE | ORAL | Status: DC

## 2011-07-18 MED ORDER — HYDROCHLOROTHIAZIDE 25 MG PO TABS: ORAL_TABLET | ORAL | Status: DC

## 2011-07-18 MED ORDER — POTASSIUM CHLORIDE CRYS ER 20 MEQ PO TBCR: 20.0000 meq | EXTENDED_RELEASE_TABLET | Freq: Three times a day (TID) | ORAL | Status: DC

## 2011-07-18 MED ORDER — NORETHIN-ETH ESTRAD-FE BIPHAS 1 MG-10 MCG / 10 MCG PO TABS: 1.0000 | ORAL_TABLET | Freq: Every day | ORAL | Status: DC

## 2011-07-18 MED ORDER — CETIRIZINE HCL 10 MG PO TABS: ORAL_TABLET | ORAL | Status: DC

## 2011-07-18 MED ORDER — METOPROLOL TARTRATE 25 MG PO TABS: ORAL_TABLET | ORAL | Status: DC

## 2011-07-18 NOTE — Assessment & Plan Note (Signed)
Well-controlled no change in medications. 90 day supply given. Will obtain a labs from endocrinologist office

## 2011-07-18 NOTE — Progress Notes (Signed)
  Subjective:    Patient ID: [name], female    DOB: 24-Jun-1971, 41 y.o.   MRN: [id number]  HPI  Patient here to have wellness form completed.  Hypertension- she is tolerating blood pressure medications, has no side effects. She had labs drawn however these were done by the endocrinologist and she does not have the results with her.  Medication- medications reviewed patient asking for 90 day supply as this is needed by her insurance company.  She has no specific concerns today and is doing well  Review of Systems   GEN- denies fatigue, fever, weight loss,weakness, recent illness HEENT- denies eye drainage, change in vision, nasal discharge, CVS- denies chest pain, palpitations RESP- denies SOB, cough, wheeze ABD- denies N/V, change in stools, abd pain Neuro- denies headache, dizziness, syncope, seizure activity     Objective:   Physical Exam GEN- NAD, alert and oriented x3 HEENT- PERRL, EOMI, non injected sclera,  Neck- Supple, no thryomegaly CVS- RRR, no murmur RESP-CTAB EXT- No edema Pulses- Radial, DP- 2+ Waist- 31 inches       Assessment & Plan:   Wellness form completed

## 2011-07-18 NOTE — Patient Instructions (Signed)
Have your labs sent to our office Blood pressure looks good. F/U as previously scheduled

## 2011-07-21 ENCOUNTER — Other Ambulatory Visit (HOSPITAL_COMMUNITY): Payer: Self-pay | Admitting: "Endocrinology

## 2011-07-21 DIAGNOSIS — E8849 Other mitochondrial metabolism disorders: Secondary | ICD-10-CM

## 2011-07-22 ENCOUNTER — Telehealth: Payer: Self-pay | Admitting: Family Medicine

## 2011-07-22 NOTE — Telephone Encounter (Signed)
Spoke with pt and gave her the lab results that she requested.

## 2011-07-28 ENCOUNTER — Other Ambulatory Visit (HOSPITAL_COMMUNITY): Payer: Self-pay

## 2011-07-29 ENCOUNTER — Encounter: Payer: Self-pay | Admitting: Family Medicine

## 2011-08-20 ENCOUNTER — Telehealth: Payer: Self-pay | Admitting: Family Medicine

## 2011-08-22 NOTE — Telephone Encounter (Signed)
Nothing in her chart at oV to suggest probs with her gall bladder, pls let her know

## 2011-08-22 NOTE — Telephone Encounter (Signed)
Patient is aware patient is going to be seen 2.18.13 and she will discuss it then

## 2011-08-25 ENCOUNTER — Encounter: Payer: Self-pay | Admitting: Family Medicine

## 2011-08-25 ENCOUNTER — Ambulatory Visit (INDEPENDENT_AMBULATORY_CARE_PROVIDER_SITE_OTHER): Payer: Managed Care, Other (non HMO) | Admitting: Family Medicine

## 2011-08-25 VITALS — BP 128/70 | HR 56 | Resp 18 | Ht 67.0 in | Wt 163.1 lb

## 2011-08-25 DIAGNOSIS — J309 Allergic rhinitis, unspecified: Secondary | ICD-10-CM

## 2011-08-25 DIAGNOSIS — E876 Hypokalemia: Secondary | ICD-10-CM

## 2011-08-25 DIAGNOSIS — E041 Nontoxic single thyroid nodule: Secondary | ICD-10-CM

## 2011-08-25 DIAGNOSIS — R109 Unspecified abdominal pain: Secondary | ICD-10-CM | POA: Insufficient documentation

## 2011-08-25 DIAGNOSIS — I10 Essential (primary) hypertension: Secondary | ICD-10-CM

## 2011-08-25 LAB — HEPATIC FUNCTION PANEL
ALT: 17 U/L (ref 0–35)
Albumin: 4.2 g/dL (ref 3.5–5.2)
Indirect Bilirubin: 0.5 mg/dL (ref 0.0–0.9)
Total Protein: 7.4 g/dL (ref 6.0–8.3)

## 2011-08-25 LAB — LIPID PANEL
Cholesterol: 149 mg/dL (ref 0–200)
HDL: 42 mg/dL (ref >39)
Total CHOL/HDL Ratio: 3.5 Ratio
Triglycerides: 67 mg/dL (ref <150)
VLDL: 13 mg/dL (ref 0–40)

## 2011-08-25 LAB — BASIC METABOLIC PANEL
BUN: 9 mg/dL (ref 6–23)
Calcium: 9.1 mg/dL (ref 8.4–10.5)
Creat: 0.99 mg/dL (ref 0.50–1.10)
Glucose, Bld: 74 mg/dL (ref 70–99)

## 2011-08-25 MED ORDER — DEXLANSOPRAZOLE 30 MG PO CPDR: 30.0000 mg | DELAYED_RELEASE_CAPSULE | Freq: Every day | ORAL | Status: DC

## 2011-08-25 MED ORDER — DICYCLOMINE HCL 20 MG PO TABS: 20.0000 mg | ORAL_TABLET | Freq: Three times a day (TID) | ORAL | Status: DC | PRN

## 2011-08-25 NOTE — Patient Instructions (Addendum)
F/u in 6 month  Labs today and you are referred for scan of abdomen and pelvis.  Two new meds as discussed, for cramping abdominal pain It is important that you exercise regularly at least 30 minutes 5 times a week. If you develop chest pain, have severe difficulty breathing, or feel very tired, stop exercising immediately and seek medical attention   A healthy diet is rich in fruit, vegetables and whole grains. Poultry fish, nuts and beans are a healthy choice for protein rather then red meat. A low sodium diet and drinking 64 ounces of water daily is generally recommended. Oils and sweet should be limited. Carbohydrates especially for those who are diabetic or overweight, should be limited to 30-45 gram per meal. It is important to eat on a regular schedule, at least 3 times daily. Snacks should be primarily fruits, vegetables or nuts.

## 2011-08-25 NOTE — Progress Notes (Signed)
  Subjective:    Patient ID: [name], female    DOB: 08/15/1970, 41 y.o.   MRN: [id number]  HPI 3 week h/o central and bilateral lower abd pain, right worse han left, nausea and   Bloating Has had loose stool for the past 2.5 weeks, was 3 to 4 times daily, now twice daily. Denies recnt fever or chills, has had no recent nasal congestion or uncontrolled allergy symptoms.   Review of Systems See HPI Denies recent fever or chills. Denies sinus pressure, nasal congestion, ear pain or sore throat. Denies chest congestion, productive cough or wheezing. Denies chest pains, palpitations and leg swelling Denies dysuria, frequency, hesitancy or incontinence. Denies joint pain, swelling and limitation in mobility. Denies headaches, seizures, numbness, or tingling. Denies depression, anxiety or insomnia. Denies skin break down or rash.        Objective:   Physical Exam Patient alert and oriented and in no cardiopulmonary distress.  HEENT: No facial asymmetry, EOMI, no sinus tenderness,  oropharynx pink and moist.  Neck supple no adenopathy.  Chest: Clear to auscultation bilaterally.  CVS: S1, S2 no murmurs, no S3.  ABD: Soft non tender. Bowel sounds normal.Possible mass   Ext: No edema  MS: Adequate ROM spine, shoulders, hips and knees.  Skin: Intact, no ulcerations or rash noted.  Psych: Good eye contact, normal affect. Memory intact not anxious or depressed appearing.  CNS: CN 2-12 intact, power, tone and sensation normal throughout.        Assessment & Plan:

## 2011-08-26 ENCOUNTER — Ambulatory Visit: Payer: Private Health Insurance - Indemnity | Admitting: Family Medicine

## 2011-08-26 LAB — H. PYLORI ANTIBODY, IGG: H Pylori IgG: 0.51 {ISR}

## 2011-08-27 ENCOUNTER — Telehealth: Payer: Self-pay | Admitting: Family Medicine

## 2011-08-27 ENCOUNTER — Other Ambulatory Visit: Payer: Self-pay

## 2011-08-27 MED ORDER — NORETHIN-ETH ESTRAD-FE BIPHAS 1 MG-10 MCG / 10 MCG PO TABS: 1.0000 | ORAL_TABLET | Freq: Every day | ORAL | Status: DC

## 2011-08-28 ENCOUNTER — Ambulatory Visit (HOSPITAL_COMMUNITY)
Admission: RE | Admit: 2011-08-28 | Discharge: 2011-08-28 | Disposition: A | Discharge status: Home / Self Care | Payer: Managed Care, Other (non HMO) | Source: Ambulatory Visit | Attending: "Endocrinology | Admitting: "Endocrinology

## 2011-08-28 ENCOUNTER — Encounter (HOSPITAL_COMMUNITY): Payer: Self-pay

## 2011-08-28 ENCOUNTER — Ambulatory Visit (HOSPITAL_COMMUNITY)
Admission: RE | Admit: 2011-08-28 | Discharge: 2011-08-28 | Disposition: A | Discharge status: Home / Self Care | Payer: Managed Care, Other (non HMO) | Source: Ambulatory Visit | Attending: Family Medicine | Admitting: Family Medicine

## 2011-08-28 DIAGNOSIS — R1033 Periumbilical pain: Secondary | ICD-10-CM | POA: Insufficient documentation

## 2011-08-28 DIAGNOSIS — R1013 Epigastric pain: Secondary | ICD-10-CM | POA: Insufficient documentation

## 2011-08-28 DIAGNOSIS — E8849 Other mitochondrial metabolism disorders: Secondary | ICD-10-CM

## 2011-08-28 DIAGNOSIS — E042 Nontoxic multinodular goiter: Secondary | ICD-10-CM | POA: Insufficient documentation

## 2011-08-28 MED ORDER — IOHEXOL 300 MG/ML  SOLN
100.0000 mL | Freq: Once | INTRAMUSCULAR | Status: AC | PRN
  Administered 2011-08-28: 100 mL via INTRAVENOUS

## 2011-08-28 NOTE — Telephone Encounter (Signed)
Pt aware and printed nutrition info for her

## 2011-08-29 ENCOUNTER — Telehealth: Payer: Self-pay | Admitting: Family Medicine

## 2011-08-29 NOTE — Telephone Encounter (Signed)
Pt aware of CT results. 

## 2011-09-10 ENCOUNTER — Telehealth: Payer: Self-pay | Admitting: Family Medicine

## 2011-09-10 ENCOUNTER — Other Ambulatory Visit: Payer: Self-pay | Admitting: Family Medicine

## 2011-09-10 MED ORDER — DIPHENOXYLATE-ATROPINE 2.5-0.025 MG PO TABS: 1.0000 | ORAL_TABLET | Freq: Four times a day (QID) | ORAL | Status: DC | PRN

## 2011-09-10 MED ORDER — PROMETHAZINE HCL 12.5 MG PO TABS: 12.5000 mg | ORAL_TABLET | Freq: Four times a day (QID) | ORAL | Status: DC | PRN

## 2011-09-10 NOTE — Telephone Encounter (Signed)
Med will be sent in, let her know pls

## 2011-09-10 NOTE — Telephone Encounter (Signed)
Threw up at 5 this morning and now diarrhea for 3rd time. Still feels nauseated. Advised BRAT and fluids. Wants something sent in for nausea and diarrhea.   [facility]

## 2011-09-10 NOTE — Telephone Encounter (Signed)
Pt aware.

## 2011-09-14 NOTE — Assessment & Plan Note (Signed)
Followed by endocrinologist 

## 2011-09-14 NOTE — Assessment & Plan Note (Signed)
Controlled, no change in medication  

## 2011-09-14 NOTE — Assessment & Plan Note (Signed)
Controlled on no regular medication, expect flare of symptoms in the next monht

## 2011-09-14 NOTE — Assessment & Plan Note (Addendum)
Uncontrolled pain with possible mass, refer for scan and start medication for cramping and dyspepsia

## 2012-01-07 ENCOUNTER — Other Ambulatory Visit: Payer: Self-pay | Admitting: Obstetrics and Gynecology

## 2012-01-19 ENCOUNTER — Other Ambulatory Visit: Payer: Self-pay | Admitting: Obstetrics and Gynecology

## 2012-02-08 ENCOUNTER — Emergency Department (HOSPITAL_COMMUNITY)
Admission: EM | Admit: 2012-02-08 | Discharge: 2012-02-08 | Disposition: A | Discharge status: Home / Self Care | Payer: Managed Care, Other (non HMO) | Attending: Emergency Medicine | Admitting: Emergency Medicine

## 2012-02-08 ENCOUNTER — Encounter (HOSPITAL_COMMUNITY): Payer: Self-pay | Admitting: *Deleted

## 2012-02-08 ENCOUNTER — Other Ambulatory Visit: Payer: Self-pay | Admitting: Family Medicine

## 2012-02-08 DIAGNOSIS — E079 Disorder of thyroid, unspecified: Secondary | ICD-10-CM | POA: Insufficient documentation

## 2012-02-08 DIAGNOSIS — R109 Unspecified abdominal pain: Secondary | ICD-10-CM

## 2012-02-08 DIAGNOSIS — E669 Obesity, unspecified: Secondary | ICD-10-CM | POA: Insufficient documentation

## 2012-02-08 DIAGNOSIS — I10 Essential (primary) hypertension: Secondary | ICD-10-CM | POA: Insufficient documentation

## 2012-02-08 DIAGNOSIS — G479 Sleep disorder, unspecified: Secondary | ICD-10-CM | POA: Insufficient documentation

## 2012-02-08 LAB — URINALYSIS, ROUTINE W REFLEX MICROSCOPIC
Nitrite: NEGATIVE
Protein, ur: NEGATIVE mg/dL
Specific Gravity, Urine: 1.025 (ref 1.005–1.030)
Urobilinogen, UA: 0.2 mg/dL (ref 0.0–1.0)

## 2012-02-08 LAB — POCT PREGNANCY, URINE: Preg Test, Ur: NEGATIVE

## 2012-02-08 NOTE — ED Provider Notes (Signed)
History  This chart was scribed for Sandra Gaskins, MD by Erskine Emery. This patient was seen in room APA07/APA07 and the patient's care was started at 8:08.   CSN: [id number]  Arrival date & time 02/08/12  0750   First MD Initiated Contact with Patient 02/08/12 [phone]      Chief Complaint  Patient presents with  . Abdominal Pain     HPI DANEKA Weaver is a 41 y.o. female who presents to the Emergency Department complaining of intermittent but suddenly worsening abdominal pain and vaginal bleeding for the past 3 weeks with associated nausea. Pt denies any fevers, diarrhea, bowel changes, dysuria, or emesis  Pt reports she had an endometrial biopsy in preparation for a tubal ligation at Dr. Rayna Sexton office around the 15th of July.   Pt tried to contact Dr. Emelda Fear a week ago and again today but was unable to be seen so she came to the ED. Pt had a tubal ligation previously, had it reversed, and now is preparing to get another one. Pt reports the last time she had sexual intercourse was about a week ago with no severe complications. Pt reports she doesn't take any pain medication other than Tylenol and would not like to have any now because she is driving. Pt has a h/o HTN.    Past Medical History  Diagnosis Date  . Thyroid nodule   . Back pain   . Chest pain     right   . Sleep disorder   . Obesity   . Hypertension     Past Surgical History  Procedure Date  . Btl 1994  . Tubal ligation reversal 2001    Family History  Problem Relation Age of Onset  . Anuerysm Mother   . Cancer Father     thyroid   . Hypertension Father     History  Substance Use Topics  . Smoking status: Never Smoker   . Smokeless tobacco: Not on file  . Alcohol Use: No    OB History    Grav Para Term Preterm Abortions TAB SAB Ect Mult Living                  Review of Systems  Constitutional: Negative for fatigue.  HENT: Negative for congestion, sinus pressure and ear discharge.   Eyes:  Negative for discharge.  Respiratory: Negative for cough.   Cardiovascular: Negative for chest pain.  Gastrointestinal: Positive for nausea and abdominal pain. Negative for vomiting, diarrhea and blood in stool.  Genitourinary: Positive for vaginal bleeding. Negative for frequency, hematuria and dyspareunia.  Musculoskeletal: Negative for back pain.  Skin: Negative for rash.  Neurological: Negative for seizures and headaches.  Hematological: Negative.   Psychiatric/Behavioral: Negative for hallucinations.  All other systems reviewed and are negative.    Allergies  Clarithromycin  Home Medications   Current Outpatient Rx  Name Route Sig Dispense Refill  . CETIRIZINE HCL 10 MG PO TABS  TAKE 1 TABLET BY MOUTH ONCE A DAY 90 tablet 1  . DEXLANSOPRAZOLE 30 MG PO CPDR Oral Take 1 capsule (30 mg total) by mouth daily. 30 capsule 1  . DICYCLOMINE HCL 20 MG PO TABS Oral Take 1 tablet (20 mg total) by mouth 3 (three) times daily as needed. 30 tablet 1  . DILTIAZEM HCL ER COATED BEADS 120 MG PO CP24  Take 1 tablet by mouth once a day 90 capsule 1  . DIPHENOXYLATE-ATROPINE 2.5-0.025 MG PO TABS Oral Take 1 tablet  by mouth 4 (four) times daily as needed for diarrhea or loose stools. 30 tablet 1  . HYDROCHLOROTHIAZIDE 25 MG PO TABS  TAKE 1 TABLET BY MOUTH EVERY DAY 90 tablet 1  . METOPROLOL TARTRATE 25 MG PO TABS  Take 1 tablet in the morning and 1/2 tablet in the evening 180 tablet 1  . NORETHIN-ETH ESTRAD-FE BIPHAS 1 MG-10 MCG / 10 MCG PO TABS Oral Take 1 tablet by mouth daily. 3 Package 3  . POTASSIUM CHLORIDE CRYS ER 20 MEQ PO TBCR Oral Take 1 tablet (20 mEq total) by mouth 3 (three) times daily. 240 tablet 1  . PROMETHAZINE HCL 12.5 MG PO TABS Oral Take 1 tablet (12.5 mg total) by mouth every 6 (six) hours as needed for nausea. 30 tablet 0    Triage Vitals: BP 125/84  Pulse 61  Temp 98.4 F (36.9 C) (Oral)  Resp 16  Ht 5\' 6"  (1.676 m)  Wt 165 lb (74.844 kg)  BMI 26.63 kg/m2  SpO2 98%   LMP 01/19/2012  Physical Exam CONSTITUTIONAL: Well developed/well nourished HEAD AND FACE: Normocephalic/atraumatic EYES: EOMI/PERRL ENMT: Mucous membranes moist NECK: supple no meningeal signs SPINE:entire spine nontender CV: S1/S2 noted, no murmurs/rubs/gallops noted LUNGS: Lungs are clear to auscultation bilaterally, no apparent distress ABDOMEN: soft, no rebound or guarding, diffuse lower mild abdominal tenderness.  GU:no cva tenderness.  Small amt of blood noted at os.  No active bleeding.  No cmt.  No lacerations NEURO: Pt is awake/alert, moves all extremitiesx4 EXTREMITIES: pulses normal, full ROM SKIN: warm, color normal PSYCH: no abnormalities of mood noted   ED Course  Procedures  DIAGNOSTIC STUDIES: Oxygen Saturation is 98% on room air, normal by my interpretation.    COORDINATION OF CARE: 8:12--I evaluated the patient and we discussed a treatment plan including urinalysis and pelvic exam to which the pt agreed. The pt does not want any pain medication.  D/w dr Ky Barban with OBGYN (on call for [place]erguson)  Given since she is over >2 weeks since biopsy, significant complications rare.  If she is stable, no significant bleeding she can f/u as outpatient  Pt improved Repeat exam reveals no significant abdominal tenderness Pt declines further workup and declines any pain meds I advised to use ibuprofen We discussed strict return precautions that include:  The pain does not go away or becomes severe, particularly over the next 8-12 hours.  A temperature above 100.49F develops.  Repeated vomiting occurs (multiple episodes).  The pain becomes localized to portions of the abdomen.   She will f/u with GYN as outpatient She has appetite, and feels improved, I doubt acute abd/gyn process    MDM  Nursing notes including past medical history and social history reviewed and considered in documentation Labs/vital reviewed and considered     I personally performed the  services described in this documentation, which was scribed in my presence. The recorded information has been reviewed and considered.           Sandra Gaskins, MD 02/08/12 1023

## 2012-02-08 NOTE — ED Notes (Signed)
Pt c/o lower abdominal pain x approximately 3 weeks since having a biopsy at Dr. Rayna Sexton office. Pt also c/o small amount dark red vaginal bleeding off and on. Pt had biopsy 3 weeks ago. Dr. Emelda Fear told her to come into the office last week but she states that she couldn't go.

## 2012-02-23 ENCOUNTER — Ambulatory Visit: Payer: Managed Care, Other (non HMO) | Admitting: Family Medicine

## 2012-03-01 ENCOUNTER — Encounter (HOSPITAL_COMMUNITY): Admission: RE | Admit: 2012-03-01 | Payer: Managed Care, Other (non HMO) | Source: Ambulatory Visit

## 2012-03-05 ENCOUNTER — Encounter (HOSPITAL_COMMUNITY): Admission: RE | Payer: Self-pay | Source: Ambulatory Visit

## 2012-03-05 ENCOUNTER — Ambulatory Visit (HOSPITAL_COMMUNITY)
Admission: RE | Admit: 2012-03-05 | Payer: Managed Care, Other (non HMO) | Source: Ambulatory Visit | Admitting: Obstetrics and Gynecology

## 2012-03-05 SURGERY — LIGATION, FALLOPIAN TUBE, LAPAROSCOPIC
Anesthesia: General | Laterality: Bilateral

## 2012-04-01 ENCOUNTER — Other Ambulatory Visit: Payer: Self-pay | Admitting: Family Medicine

## 2012-04-07 ENCOUNTER — Encounter (HOSPITAL_COMMUNITY): Payer: Self-pay | Admitting: *Deleted

## 2012-04-07 ENCOUNTER — Inpatient Hospital Stay (HOSPITAL_COMMUNITY): Payer: Managed Care, Other (non HMO)

## 2012-04-07 ENCOUNTER — Inpatient Hospital Stay (HOSPITAL_COMMUNITY)
Admission: AD | Admit: 2012-04-07 | Discharge: 2012-04-07 | Disposition: A | Discharge status: Home / Self Care | Payer: Managed Care, Other (non HMO) | Source: Ambulatory Visit | Attending: Obstetrics & Gynecology | Admitting: Obstetrics & Gynecology

## 2012-04-07 DIAGNOSIS — O2390 Unspecified genitourinary tract infection in pregnancy, unspecified trimester: Secondary | ICD-10-CM | POA: Insufficient documentation

## 2012-04-07 DIAGNOSIS — N72 Inflammatory disease of cervix uteri: Secondary | ICD-10-CM | POA: Insufficient documentation

## 2012-04-07 DIAGNOSIS — R1013 Epigastric pain: Secondary | ICD-10-CM

## 2012-04-07 DIAGNOSIS — R109 Unspecified abdominal pain: Secondary | ICD-10-CM | POA: Insufficient documentation

## 2012-04-07 LAB — COMPREHENSIVE METABOLIC PANEL
ALT: 13 U/L (ref 0–35)
AST: 15 U/L (ref 0–37)
Albumin: 3.5 g/dL (ref 3.5–5.2)
Alkaline Phosphatase: 74 U/L (ref 39–117)
Calcium: 9 mg/dL (ref 8.4–10.5)
Potassium: 2.9 mEq/L — ABNORMAL LOW (ref 3.5–5.1)
Sodium: 137 mEq/L (ref 135–145)
Total Protein: 7.4 g/dL (ref 6.0–8.3)

## 2012-04-07 LAB — CBC WITH DIFFERENTIAL/PLATELET
Basophils Absolute: 0.1 10*3/uL (ref 0.0–0.1)
Basophils Relative: 1 % (ref 0–1)
Eosinophils Absolute: 0.1 10*3/uL (ref 0.0–0.7)
Eosinophils Relative: 1 % (ref 0–5)
Lymphs Abs: 1.7 10*3/uL (ref 0.7–4.0)
MCH: 22 pg — ABNORMAL LOW (ref 26.0–34.0)
MCV: 69.4 fL — ABNORMAL LOW (ref 78.0–100.0)
Neutrophils Relative %: 73 % (ref 43–77)
Platelets: 281 10*3/uL (ref 150–400)
RBC: 5.55 MIL/uL — ABNORMAL HIGH (ref 3.87–5.11)
RDW: 14.4 % (ref 11.5–15.5)

## 2012-04-07 LAB — URINALYSIS, ROUTINE W REFLEX MICROSCOPIC
Bilirubin Urine: NEGATIVE
Glucose, UA: NEGATIVE mg/dL
Ketones, ur: NEGATIVE mg/dL
Leukocytes, UA: NEGATIVE
Nitrite: NEGATIVE
Protein, ur: NEGATIVE mg/dL

## 2012-04-07 LAB — WET PREP, GENITAL
Clue Cells Wet Prep HPF POC: NONE SEEN
Trich, Wet Prep: NONE SEEN
Yeast Wet Prep HPF POC: NONE SEEN

## 2012-04-07 MED ORDER — RANITIDINE HCL 150 MG PO TABS: 150.0000 mg | ORAL_TABLET | Freq: Two times a day (BID) | ORAL | Status: DC

## 2012-04-07 MED ORDER — AZITHROMYCIN 250 MG PO TABS
1000.0000 mg | ORAL_TABLET | Freq: Once | ORAL | Status: AC
  Administered 2012-04-07: 1000 mg via ORAL
  Filled 2012-04-07: qty 4

## 2012-04-07 MED ORDER — FLUCONAZOLE 150 MG PO TABS
150.0000 mg | ORAL_TABLET | Freq: Once | ORAL | Status: AC
  Administered 2012-04-07: 150 mg via ORAL
  Filled 2012-04-07: qty 1

## 2012-04-07 MED ORDER — CEFTRIAXONE SODIUM 250 MG IJ SOLR
250.0000 mg | Freq: Once | INTRAMUSCULAR | Status: AC
  Administered 2012-04-07: 250 mg via INTRAMUSCULAR
  Filled 2012-04-07: qty 250

## 2012-04-07 NOTE — MAU Provider Note (Signed)
History     CSN: [id number]  Arrival date and time: 04/07/12 1000   First Provider Initiated Contact with Patient 04/07/12 1045      Chief Complaint  Patient presents with  . Abdominal Pain  . Back Pain   HPI [name] is a 41 y.o. female who presents to MAU with abdominal pain. The pain started one week ago. She describes the pain as aching that comes and goes. The pain has increased over the past 2 days and she rates the pain 4/10. The pain is located at the umbilicus and radiates to the back and both sides of the abdomen and down right leg. She states that when lying down feeling as food coming up to chest.  Denies chest pain or pressure. Associated symptoms include nausea. Last BM 2 days ago. Today noted rectal itching and ? Hemorrhoid. The history was provided by the patient.  OB History    Grav Para Term Preterm Abortions TAB SAB Ect Mult Living   6 5 2 3 1 1  0 0 0 5      Past Medical History  Diagnosis Date  . Thyroid nodule   . Back pain   . Chest pain     right   . Sleep disorder   . Obesity   . Hypertension     Past Surgical History  Procedure Date  . Btl 1994  . Tubal ligation reversal 2001    Family History  Problem Relation Age of Onset  . Anuerysm Mother   . Cancer Father     thyroid   . Hypertension Father     History  Substance Use Topics  . Smoking status: Never Smoker   . Smokeless tobacco: Not on file  . Alcohol Use: No    Allergies:  Allergies  Allergen Reactions  . Clarithromycin Diarrhea, Nausea And Vomiting and Other (See Comments)    Blood in stool    Prescriptions prior to admission  Medication Sig Dispense Refill  . cetirizine (ZYRTEC) 10 MG tablet Take 10 mg by mouth daily as needed. For allergy symptoms      . diltiazem (CARDIZEM CD) 120 MG 24 hr capsule TAKE ONE CAPSULE BY MOUTH EVERY DAY  90 capsule  0  . hydrochlorothiazide (HYDRODIURIL) 25 MG tablet TAKE 1 TABLET BY MOUTH EVERY DAY  90 tablet  0  . metoprolol  tartrate (LOPRESSOR) 25 MG tablet TAKE 1 TABLET IN THE MORNING AND 1/2 TABLET IN THE EVENING  60 tablet  0  . Norethindrone-Ethinyl Estradiol-Fe Biphas (LO LOESTRIN FE) 1 MG-10 MCG / 10 MCG tablet Take 1 tablet by mouth daily.  3 Package  3  . potassium chloride SA (KLOR-CON M20) 20 MEQ tablet Take 1 tablet (20 mEq total) by mouth 3 (three) times daily.  240 tablet  1  . Dexlansoprazole (DEXILANT) 30 MG capsule Take 1 capsule (30 mg total) by mouth daily.  30 capsule  1  . promethazine (PHENERGAN) 12.5 MG tablet Take 1 tablet (12.5 mg total) by mouth every 6 (six) hours as needed for nausea.  30 tablet  0    Review of Systems  Constitutional: Negative for fever, chills and weight loss.  HENT: Negative for ear pain, nosebleeds, congestion, sore throat and neck pain.   Eyes: Negative for blurred vision, double vision, photophobia and pain.  Respiratory: Negative for cough, shortness of breath and wheezing.   Cardiovascular: Negative for chest pain, palpitations and leg swelling.  Gastrointestinal: Positive for nausea  and abdominal pain. Negative for heartburn, vomiting, diarrhea and constipation.  Genitourinary: Negative for dysuria, urgency and frequency.  Musculoskeletal: Positive for back pain. Negative for myalgias.  Skin: Negative for itching and rash.  Neurological: Negative for dizziness, sensory change, speech change, seizures, weakness and headaches.  Endo/Heme/Allergies: Does not bruise/bleed easily.  Psychiatric/Behavioral: Negative for depression. The patient is not nervous/anxious and does not have insomnia.    Physical Exam   Blood pressure 123/86, pulse 63, temperature 97.8 F (36.6 C), temperature source Oral, resp. rate 20.  Physical Exam  Nursing note and vitals reviewed. Constitutional: She is oriented to person, place, and time. She appears well-developed and well-nourished. No distress.  HENT:  Head: Normocephalic and atraumatic.  Eyes: EOM are normal.  Neck: Neck  supple.  Cardiovascular: Normal rate, regular rhythm and normal heart sounds.   Respiratory: Effort normal and breath sounds normal.  GI: Soft. Bowel sounds are normal. There is tenderness in the right upper quadrant, right lower quadrant, epigastric area, periumbilical area, suprapubic area and left lower quadrant. There is no rigidity, no rebound, no guarding and no CVA tenderness.  Genitourinary:       External genitalia without lesions. Yellow, blood tinged discharge vaginal vault. Positive CMT, bilateral adnexal tenderness. Uterus without palpable enlargement.  Musculoskeletal: Normal range of motion.  Neurological: She is alert and oriented to person, place, and time.  Skin: Skin is warm and dry.  Psychiatric: She has a normal mood and affect. Her behavior is normal. Judgment and thought content normal.   Assessment: 41 y.o. female with abdominal pain   Cervicitis   Epigastric pain  Plan:  Rocephin 250 mg IM   Zithromax 1 gram po   Zantac Rx   Follow up with [facility]  I have reviewed this patient's vital signs, nurses notes, appropriate labs and imaging. I have discussed the results with the patient and she voices understanding.   Medication List     As of 04/07/2012  8:19 PM    START taking these medications         ranitidine 150 MG tablet   Commonly known as: ZANTAC   Take 1 tablet (150 mg total) by mouth 2 (two) times daily.      CONTINUE taking these medications         cetirizine 10 MG tablet   Commonly known as: ZYRTEC      Dexlansoprazole 30 MG capsule   Take 1 capsule (30 mg total) by mouth daily.      diltiazem 120 MG 24 hr capsule   Commonly known as: CARDIZEM CD   TAKE ONE CAPSULE BY MOUTH EVERY DAY      hydrochlorothiazide 25 MG tablet   Commonly known as: HYDRODIURIL   TAKE 1 TABLET BY MOUTH EVERY DAY      metoprolol tartrate 25 MG tablet   Commonly known as: LOPRESSOR   TAKE 1 TABLET IN THE MORNING AND 1/2 TABLET IN THE EVENING       Norethindrone-Ethinyl Estradiol-Fe Biphas 1 MG-10 MCG / 10 MCG tablet   Commonly known as: LO LOESTRIN FE   Take 1 tablet by mouth daily.      potassium chloride SA 20 MEQ tablet   Commonly known as: K-DUR,KLOR-CON   Take 1 tablet (20 mEq total) by mouth 3 (three) times daily.      promethazine 12.5 MG tablet   Commonly known as: PHENERGAN   Take 1 tablet (12.5 mg total) by mouth every 6 (six)  hours as needed for nausea.          Where to get your medications    These are the prescriptions that you need to pick up. We sent them to a specific pharmacy, so you will need to go there to get them.   [id number] - [place], [place] - [address] AT [facility]    [address] [place] [address]    Phone: [phone]        ranitidine 150 MG tablet            Results for orders placed during the hospital encounter of 04/07/12 (from the past 24 hour(s))  URINALYSIS, ROUTINE W REFLEX MICROSCOPIC     Status: Abnormal   Collection Time   04/07/12 10:00 AM      Component Value Range   Color, Urine YELLOW  YELLOW   APPearance CLEAR  CLEAR   Specific Gravity, Urine >1.030 (*) 1.005 - 1.030   pH 5.5  5.0 - 8.0   Glucose, UA NEGATIVE  NEGATIVE mg/dL   Hgb urine dipstick TRACE (*) NEGATIVE   Bilirubin Urine NEGATIVE  NEGATIVE   Ketones, ur NEGATIVE  NEGATIVE mg/dL   Protein, ur NEGATIVE  NEGATIVE mg/dL   Urobilinogen, UA 0.2  0.0 - 1.0 mg/dL   Nitrite NEGATIVE  NEGATIVE   Leukocytes, UA NEGATIVE  NEGATIVE  URINE MICROSCOPIC-ADD ON     Status: Normal   Collection Time   04/07/12 10:00 AM      Component Value Range   Squamous Epithelial / LPF RARE  RARE   RBC / HPF 0-2  <3 RBC/hpf  POCT PREGNANCY, URINE     Status: Normal   Collection Time   04/07/12 10:27 AM      Component Value Range   Preg Test, Ur NEGATIVE  NEGATIVE  CBC WITH DIFFERENTIAL     Status: Abnormal   Collection Time   04/07/12 11:05 AM      Component Value Range   WBC 9.0  4.0 - 10.5 K/uL   RBC  5.55 (*) 3.87 - 5.11 MIL/uL   Hemoglobin 12.2  12.0 - 15.0 g/dL   HCT 56.2  13.0 - 86.5 %   MCV 69.4 (*) 78.0 - 100.0 fL   MCH 22.0 (*) 26.0 - 34.0 pg   MCHC 31.7  30.0 - 36.0 g/dL   RDW 78.4  69.6 - 29.5 %   Platelets 281  150 - 400 K/uL   Neutrophils Relative 73  43 - 77 %   Neutro Abs 6.6  1.7 - 7.7 K/uL   Lymphocytes Relative 19  12 - 46 %   Lymphs Abs 1.7  0.7 - 4.0 K/uL   Monocytes Relative 6  3 - 12 %   Monocytes Absolute 0.5  0.1 - 1.0 K/uL   Eosinophils Relative 1  0 - 5 %   Eosinophils Absolute 0.1  0.0 - 0.7 K/uL   Basophils Relative 1  0 - 1 %   Basophils Absolute 0.1  0.0 - 0.1 K/uL  COMPREHENSIVE METABOLIC PANEL     Status: Abnormal   Collection Time   04/07/12 11:05 AM      Component Value Range   Sodium 137  135 - 145 mEq/L   Potassium 2.9 (*) 3.5 - 5.1 mEq/L   Chloride 100  96 - 112 mEq/L   CO2 30  19 - 32 mEq/L   Glucose, Bld 84  70 - 99 mg/dL  BUN 9  6 - 23 mg/dL   Creatinine, Ser 2.95  0.50 - 1.10 mg/dL   Calcium 9.0  8.4 - 62.1 mg/dL   Total Protein 7.4  6.0 - 8.3 g/dL   Albumin 3.5  3.5 - 5.2 g/dL   AST 15  0 - 37 U/L   ALT 13  0 - 35 U/L   Alkaline Phosphatase 74  39 - 117 U/L   Total Bilirubin 0.3  0.3 - 1.2 mg/dL   GFR calc non Af Amer 78 (*) >90 mL/min   GFR calc Af Amer >90  >90 mL/min  AMYLASE     Status: Normal   Collection Time   04/07/12 11:05 AM      Component Value Range   Amylase 98  0 - 105 U/L  LIPASE, BLOOD     Status: Abnormal   Collection Time   04/07/12 11:05 AM      Component Value Range   Lipase 83 (*) 11 - 59 U/L  WET PREP, GENITAL     Status: Abnormal   Collection Time   04/07/12 11:45 AM      Component Value Range   Yeast Wet Prep HPF POC NONE SEEN  NONE SEEN   Trich, Wet Prep NONE SEEN  NONE SEEN   Clue Cells Wet Prep HPF POC NONE SEEN  NONE SEEN   WBC, Wet Prep HPF POC TOO NUMEROUS TO COUNT (*) NONE SEEN   MAU Course  Procedures US Abdomen Complete  04/07/2012  *RADIOLOGY REPORT*  Clinical Data:  41 year old  female with right-sided abdominal pain.  ABDOMINAL ULTRASOUND COMPLETE  Comparison:  08/28/2011 CT  Findings:  Gallbladder:  The gallbladder is unremarkable. There is no evidence of gallstones, gallbladder wall thickening, or pericholecystic fluid.  Common Bile Duct:  There is no evidence of intrahepatic or extrahepatic biliary dilation. The CBD measures 2.2 mm in greatest diameter.  Liver:  The liver is within normal limits in parenchymal echogenicity. No focal abnormalities are identified.  IVC:  Appears normal.  Pancreas:  Although the pancreas is difficult to visualize in its entirety, no focal pancreatic abnormality is identified.  Spleen:  Within normal limits in size and echotexture.  Right kidney:  The right kidney is normal in size and parenchymal echogenicity.  There is no evidence of solid mass, hydronephrosis or definite renal calculi.  The right kidney measures 11.3 cm.  Left kidney:  The left kidney is normal in size and parenchymal echogenicity.  There is no evidence of solid mass, hydronephrosis or definite renal calculi.   The left kidney measures 10.6 cm.  Abdominal Aorta:  No abdominal aortic aneurysm identified.  There is no evidence of ascites.  IMPRESSION: Negative abdominal ultrasound.   Original Report Authenticated By: Rosendo Gros, M.D.    US Transvaginal Non-ob  04/07/2012  *RADIOLOGY REPORT*  Clinical Data: 41 year old female with pelvic pain.  History of tubal ligation reversal in 2000.  TRANSABDOMINAL AND TRANSVAGINAL ULTRASOUND OF PELVIS Technique:  Both transabdominal and transvaginal ultrasound examinations of the pelvis were performed. Transabdominal technique was performed for global imaging of the pelvis including uterus, ovaries, adnexal regions, and pelvic cul-de-sac.  It was necessary to proceed with endovaginal exam following the transabdominal exam to visualize the endometrium and ovaries.  Comparison:  None  Findings:  Uterus: The uterus is anteverted measuring 7 x 4 x  4.8 cm.  No focal masses or abnormalities are identified.  Endometrium: The endometrial stripe is homogeneous measuring 4 mm  in thickness.  Right ovary:  The right ovary is unremarkable measuring 4 x 1.7 x 2.1 cm. Color flow within the right ovary is noted.  Left ovary: The left ovary is unremarkable measuring 3.7 x 1.6 x 2.3 cm. Color flow within the left ovary is noted.  Other findings: A trace amount of free fluid in the pelvis is noted. There is no evidence of adnexal mass.  IMPRESSION: Trace amount of free pelvic fluid which may be physiologic. Otherwise normal pelvic ultrasound.   Original Report Authenticated By: Rosendo Gros, M.D.    US Pelvis Complete  04/07/2012  *RADIOLOGY REPORT*  Clinical Data: 41 year old female with pelvic pain.  History of tubal ligation reversal in 2000.  TRANSABDOMINAL AND TRANSVAGINAL ULTRASOUND OF PELVIS Technique:  Both transabdominal and transvaginal ultrasound examinations of the pelvis were performed. Transabdominal technique was performed for global imaging of the pelvis including uterus, ovaries, adnexal regions, and pelvic cul-de-sac.  It was necessary to proceed with endovaginal exam following the transabdominal exam to visualize the endometrium and ovaries.  Comparison:  None  Findings:  Uterus: The uterus is anteverted measuring 7 x 4 x 4.8 cm.  No focal masses or abnormalities are identified.  Endometrium: The endometrial stripe is homogeneous measuring 4 mm in thickness.  Right ovary:  The right ovary is unremarkable measuring 4 x 1.7 x 2.1 cm. Color flow within the right ovary is noted.  Left ovary: The left ovary is unremarkable measuring 3.7 x 1.6 x 2.3 cm. Color flow within the left ovary is noted.  Other findings: A trace amount of free fluid in the pelvis is noted. There is no evidence of adnexal mass.  IMPRESSION: Trace amount of free pelvic fluid which may be physiologic. Otherwise normal pelvic ultrasound.   Original Report Authenticated By: Rosendo Gros, M.D.   Kerrie Buffalo, RN, FNP, [facility] 04/07/2012, 10:46 AM

## 2012-04-07 NOTE — MAU Note (Signed)
For last wk or 2 has been having pain in stomach, stomach is feeling weak- nauseated.  Is on birth control pills, last period was only one day.  Has been having pain in legs and low back.

## 2012-04-07 NOTE — MAU Note (Signed)
Period was one day and did not come on when supposed to.

## 2012-04-08 LAB — GC/CHLAMYDIA PROBE AMP, GENITAL: GC Probe Amp, Genital: NEGATIVE

## 2012-05-08 ENCOUNTER — Other Ambulatory Visit: Payer: Self-pay | Admitting: Family Medicine

## 2012-05-16 ENCOUNTER — Other Ambulatory Visit: Payer: Self-pay | Admitting: Family Medicine

## 2012-05-31 ENCOUNTER — Other Ambulatory Visit: Payer: Self-pay | Admitting: Family Medicine

## 2012-06-04 ENCOUNTER — Other Ambulatory Visit: Payer: Self-pay | Admitting: Family Medicine

## 2012-06-04 DIAGNOSIS — Z139 Encounter for screening, unspecified: Secondary | ICD-10-CM

## 2012-06-06 ENCOUNTER — Other Ambulatory Visit: Payer: Self-pay | Admitting: Family Medicine

## 2012-06-10 ENCOUNTER — Other Ambulatory Visit: Payer: Self-pay | Admitting: Adult Health

## 2012-06-10 ENCOUNTER — Other Ambulatory Visit (HOSPITAL_COMMUNITY)
Admission: RE | Admit: 2012-06-10 | Discharge: 2012-06-10 | Disposition: A | Discharge status: Home / Self Care | Payer: Managed Care, Other (non HMO) | Source: Ambulatory Visit | Attending: Obstetrics and Gynecology | Admitting: Obstetrics and Gynecology

## 2012-06-10 DIAGNOSIS — Z113 Encounter for screening for infections with a predominantly sexual mode of transmission: Secondary | ICD-10-CM | POA: Insufficient documentation

## 2012-06-10 DIAGNOSIS — Z01419 Encounter for gynecological examination (general) (routine) without abnormal findings: Secondary | ICD-10-CM | POA: Insufficient documentation

## 2012-06-10 DIAGNOSIS — Z1151 Encounter for screening for human papillomavirus (HPV): Secondary | ICD-10-CM | POA: Insufficient documentation

## 2012-06-21 ENCOUNTER — Ambulatory Visit (HOSPITAL_COMMUNITY): Payer: Managed Care, Other (non HMO)

## 2012-06-26 ENCOUNTER — Other Ambulatory Visit: Payer: Self-pay | Admitting: Family Medicine

## 2012-07-27 ENCOUNTER — Ambulatory Visit (INDEPENDENT_AMBULATORY_CARE_PROVIDER_SITE_OTHER): Payer: Managed Care, Other (non HMO) | Admitting: Family Medicine

## 2012-07-27 ENCOUNTER — Encounter: Payer: Self-pay | Admitting: Family Medicine

## 2012-07-27 VITALS — BP 120/80 | HR 86 | Resp 18 | Wt 163.0 lb

## 2012-07-27 DIAGNOSIS — Z6825 Body mass index (BMI) 25.0-25.9, adult: Secondary | ICD-10-CM

## 2012-07-27 DIAGNOSIS — E041 Nontoxic single thyroid nodule: Secondary | ICD-10-CM

## 2012-07-27 DIAGNOSIS — E876 Hypokalemia: Secondary | ICD-10-CM

## 2012-07-27 DIAGNOSIS — I10 Essential (primary) hypertension: Secondary | ICD-10-CM

## 2012-07-27 DIAGNOSIS — E559 Vitamin D deficiency, unspecified: Secondary | ICD-10-CM

## 2012-07-27 DIAGNOSIS — R7303 Prediabetes: Secondary | ICD-10-CM

## 2012-07-27 DIAGNOSIS — E663 Overweight: Secondary | ICD-10-CM

## 2012-07-27 DIAGNOSIS — R1013 Epigastric pain: Secondary | ICD-10-CM | POA: Insufficient documentation

## 2012-07-27 DIAGNOSIS — R7309 Other abnormal glucose: Secondary | ICD-10-CM

## 2012-07-27 DIAGNOSIS — R109 Unspecified abdominal pain: Secondary | ICD-10-CM

## 2012-07-27 LAB — BASIC METABOLIC PANEL
BUN: 13 mg/dL (ref 6–23)
CO2: 29 mEq/L (ref 19–32)
Calcium: 9.6 mg/dL (ref 8.4–10.5)
Chloride: 102 mEq/L (ref 96–112)
Creat: 0.89 mg/dL (ref 0.50–1.10)
Glucose, Bld: 82 mg/dL (ref 70–99)

## 2012-07-27 MED ORDER — ESOMEPRAZOLE MAGNESIUM 40 MG PO CPDR: 40.0000 mg | DELAYED_RELEASE_CAPSULE | Freq: Every day | ORAL | Status: DC

## 2012-07-27 NOTE — Patient Instructions (Addendum)
F/u in4.5 month  Please call if you need me before  Labs today HBA1C, Vit D, chem 7, H pylori  2 month trial of PPI for epigastric pain, call if still persists for referral to GI of your choice.  Please cut back on caffeine and also NSAIDS like aspirin , alleve , ibuprofen  You need to take Oscal D 1200mg /1000IU daily  Please stop overeating seets ancommit daily physicalactiivity  All the best!

## 2012-07-27 NOTE — Progress Notes (Signed)
  Subjective:    Patient ID: [name], female    DOB: 11/17/1970, 42 y.o.   MRN: [id number]  HPI The PT is here for follow up and re-evaluation of chronic medical conditions, medication management and review of any available recent lab and radiology data.  Preventive health is updated, specifically  Cancer screening and Immunization.   Questions or concerns regarding consultations or procedures which the PT has had in the interim are  addressed. The PT denies any adverse reactions to current medications since the last visit.  2 week h/o epigastric pain with and without food. Denies nausea, vomiting , hematemesis or melena. No dysphagia. No known h/o ulcers. Has started eating increased carbs and sweets, was exercising but with temp change has stopped    Review of Systems See HPI Denies recent fever or chills. Denies sinus pressure, nasal congestion, ear pain or sore throat. Denies chest congestion, productive cough or wheezing. Denies chest pains, palpitations and leg swelling .   Denies dysuria, frequency, hesitancy or incontinence. Denies joint pain, swelling and limitation in mobility. Denies headaches, seizures, numbness, or tingling. Denies depression, anxiety or insomnia. Denies skin break down or rash.        Objective:   Physical Exam Patient alert and oriented and in no cardiopulmonary distress.  HEENT: No facial asymmetry, EOMI, no sinus tenderness,  oropharynx pink and moist.  Neck supple no adenopathy.  Chest: Clear to auscultation bilaterally.  CVS: S1, S2 no murmurs, no S3.  ABD: Soft mild epigastric tenderness, no guarding or rebound. No palpable organomegaly or masses . Bowel sounds normal.  Ext: No edema  MS: Adequate ROM spine, shoulders, hips and knees.  Skin: Intact, no ulcerations or rash noted.  Psych: Good eye contact, normal affect. Memory intact not anxious or depressed appearing.  CNS: CN 2-12 intact, power, tone and sensation normal  throughout.        Assessment & Plan:

## 2012-07-28 DIAGNOSIS — E559 Vitamin D deficiency, unspecified: Secondary | ICD-10-CM | POA: Insufficient documentation

## 2012-07-28 LAB — VITAMIN D 25 HYDROXY (VIT D DEFICIENCY, FRACTURES): Vit D, 25-Hydroxy: 16 ng/mL — ABNORMAL LOW (ref 30–89)

## 2012-07-30 ENCOUNTER — Other Ambulatory Visit: Payer: Self-pay

## 2012-07-30 ENCOUNTER — Telehealth: Payer: Self-pay | Admitting: Family Medicine

## 2012-07-30 DIAGNOSIS — E559 Vitamin D deficiency, unspecified: Secondary | ICD-10-CM

## 2012-07-30 DIAGNOSIS — E876 Hypokalemia: Secondary | ICD-10-CM

## 2012-07-30 DIAGNOSIS — R7303 Prediabetes: Secondary | ICD-10-CM

## 2012-07-30 MED ORDER — ERGOCALCIFEROL 1.25 MG (50000 UT) PO CAPS: 50000.0000 [IU] | ORAL_CAPSULE | ORAL | Status: AC

## 2012-07-30 NOTE — Telephone Encounter (Signed)
Patient aware of results.

## 2012-08-01 DIAGNOSIS — E663 Overweight: Secondary | ICD-10-CM | POA: Insufficient documentation

## 2012-08-01 DIAGNOSIS — R7303 Prediabetes: Secondary | ICD-10-CM | POA: Insufficient documentation

## 2012-08-01 NOTE — Assessment & Plan Note (Signed)
Unchanged. Patient re-educated about  the importance of commitment to a  minimum of 150 minutes of exercise per week. The importance of healthy food choices with portion control discussed. Encouraged to start a food diary, count calories and to consider  joining a support group. Sample diet sheets offered. Goals set by the patient for the next several months.    

## 2012-08-01 NOTE — Assessment & Plan Note (Signed)
Followed by endo.  

## 2012-08-01 NOTE — Assessment & Plan Note (Signed)
Importance of supplementation discussed with pt esp since on depoprovera

## 2012-08-01 NOTE — Assessment & Plan Note (Signed)
Recurrent problem due to med noncompliance

## 2012-08-01 NOTE — Assessment & Plan Note (Signed)
Check hpylori. 2 month trial of PPI, if persists then GI eval

## 2012-08-01 NOTE — Assessment & Plan Note (Signed)
Unchanged in the past ear. Lifestyle changes necessary to imrove health and avert DM

## 2012-08-01 NOTE — Assessment & Plan Note (Signed)
Controlled, no change in medication DASH diet and commitment to daily physical activity for a minimum of 30 minutes discussed and encouraged, as a part of hypertension management. The importance of attaining a healthy weight is also discussed.  

## 2012-08-04 ENCOUNTER — Telehealth: Payer: Self-pay | Admitting: Family Medicine

## 2012-08-04 ENCOUNTER — Other Ambulatory Visit: Payer: Self-pay | Admitting: Family Medicine

## 2012-08-04 ENCOUNTER — Other Ambulatory Visit: Payer: Self-pay

## 2012-08-04 MED ORDER — METOPROLOL TARTRATE 25 MG PO TABS: ORAL_TABLET | ORAL | Status: DC

## 2012-08-04 MED ORDER — FLUCONAZOLE 150 MG PO TABS: ORAL_TABLET | ORAL | Status: DC

## 2012-08-04 MED ORDER — SULFAMETHOXAZOLE-TRIMETHOPRIM 800-160 MG PO TABS: 1.0000 | ORAL_TABLET | Freq: Two times a day (BID) | ORAL | Status: DC

## 2012-08-04 NOTE — Telephone Encounter (Signed)
pls advise saline flushes, I am entering z pack pls send after you speak with her

## 2012-08-04 NOTE — Telephone Encounter (Signed)
Note septra sent instead of z pack due to potential allergic reaction and 2 diflucan pls let her know

## 2012-08-04 NOTE — Telephone Encounter (Signed)
Patient aware.

## 2012-08-04 NOTE — Telephone Encounter (Signed)
States since Sunday she has had sinus pressure and sinus congestion, unable to blow any out. Ears stopped up, mainly on left side. Sinus headache. Wants to know if she can get something called in since the weather is bad. [facility]

## 2012-08-05 ENCOUNTER — Telehealth: Payer: Self-pay | Admitting: Family Medicine

## 2012-08-06 ENCOUNTER — Other Ambulatory Visit: Payer: Self-pay

## 2012-08-06 MED ORDER — FLUCONAZOLE 150 MG PO TABS: ORAL_TABLET | ORAL | Status: DC

## 2012-08-06 MED ORDER — SULFAMETHOXAZOLE-TRIMETHOPRIM 800-160 MG PO TABS: 1.0000 | ORAL_TABLET | Freq: Two times a day (BID) | ORAL | Status: AC

## 2012-08-06 MED ORDER — HYDROCHLOROTHIAZIDE 25 MG PO TABS: ORAL_TABLET | ORAL | Status: DC

## 2012-08-06 MED ORDER — METOPROLOL TARTRATE 25 MG PO TABS: ORAL_TABLET | ORAL | Status: DC

## 2012-08-06 NOTE — Telephone Encounter (Signed)
[facility] removed from chart

## 2012-08-09 ENCOUNTER — Other Ambulatory Visit: Payer: Self-pay

## 2012-08-09 MED ORDER — DILTIAZEM HCL ER COATED BEADS 120 MG PO CP24: ORAL_CAPSULE | ORAL | Status: DC

## 2012-08-11 ENCOUNTER — Ambulatory Visit (HOSPITAL_COMMUNITY): Payer: Managed Care, Other (non HMO)

## 2012-10-25 ENCOUNTER — Other Ambulatory Visit: Payer: Self-pay | Admitting: Family Medicine

## 2012-10-28 ENCOUNTER — Encounter: Payer: Self-pay | Admitting: *Deleted

## 2012-10-29 ENCOUNTER — Encounter: Payer: Self-pay | Admitting: Adult Health

## 2012-10-29 ENCOUNTER — Ambulatory Visit (INDEPENDENT_AMBULATORY_CARE_PROVIDER_SITE_OTHER): Payer: Managed Care, Other (non HMO) | Admitting: Adult Health

## 2012-10-29 VITALS — BP 122/80 | HR 74 | Ht 66.0 in | Wt 168.5 lb

## 2012-10-29 DIAGNOSIS — J329 Chronic sinusitis, unspecified: Secondary | ICD-10-CM

## 2012-10-29 DIAGNOSIS — N938 Other specified abnormal uterine and vaginal bleeding: Secondary | ICD-10-CM

## 2012-10-29 DIAGNOSIS — J321 Chronic frontal sinusitis: Secondary | ICD-10-CM | POA: Insufficient documentation

## 2012-10-29 DIAGNOSIS — N926 Irregular menstruation, unspecified: Secondary | ICD-10-CM

## 2012-10-29 DIAGNOSIS — N949 Unspecified condition associated with female genital organs and menstrual cycle: Secondary | ICD-10-CM

## 2012-10-29 MED ORDER — AZITHROMYCIN 250 MG PO TABS: ORAL_TABLET | ORAL | Status: DC

## 2012-10-29 NOTE — Progress Notes (Signed)
Subjective:     Patient ID: [name], female   DOB: [birth year], 42 y.o.   MRN: [id number]  HPI Analys is a 41 year old black female in with bleeding on Depo. And stomach hurts some. Complains of congestion in head.  Review of Systems Patient denies any headaches, blurred vision, shortness of breath, chest pain, problems with bowel movements, urination, or intercourse. Complaints as above. Reviewed past medical,surgical, social and family history. Reviewed medications and allergies.     Objective:   Physical Exam Blood pressure 122/80, pulse 74, height 5\' 6"  (1.676 m), weight 168 lb 8 oz (76.431 kg).   Skin warm and dry.+ tenderness at ethmoid sinus, Np lymph nodes palpated, ears clear, Lungs: clear to ausculation bilaterally, cardiovascular: regular rate and rhythm.Pelvic: external gentatalia normal  In appearance, vagina has brownish discharge, no odor, cervix bulbous with negative CMT, uterus felt to be NSSC, non tender, No adnexal masses or tenderness noted. Has taken Z-pack in past with no problems.Will check GC/CHL Assessment:      Bleeding with Depo provera   Sinus infection Plan:      Follow up lab by phone Monday Take Z-pack, increase fluids, take zyrtec Get Depo in May.

## 2012-10-29 NOTE — Patient Instructions (Addendum)
Take Z- pack Take zyrtec Increase fluids Return in May for Depo. Follow up lab by phone on Monday Sign up for my chart

## 2012-10-30 LAB — GC/CHLAMYDIA PROBE AMP
CT Probe RNA: NEGATIVE
GC Probe RNA: NEGATIVE

## 2012-11-01 ENCOUNTER — Telehealth: Payer: Self-pay | Admitting: Adult Health

## 2012-11-01 NOTE — Telephone Encounter (Signed)
Left message that test were negative.

## 2012-11-16 ENCOUNTER — Telehealth: Payer: Self-pay | Admitting: Family Medicine

## 2012-11-16 ENCOUNTER — Other Ambulatory Visit: Payer: Self-pay

## 2012-11-16 ENCOUNTER — Ambulatory Visit: Payer: Self-pay

## 2012-11-16 MED ORDER — DILTIAZEM HCL ER COATED BEADS 120 MG PO CP24: ORAL_CAPSULE | ORAL | Status: DC

## 2012-11-16 NOTE — Telephone Encounter (Signed)
Med was sent in while pt was on the phone

## 2012-11-18 ENCOUNTER — Ambulatory Visit: Payer: Self-pay

## 2012-11-18 ENCOUNTER — Ambulatory Visit (INDEPENDENT_AMBULATORY_CARE_PROVIDER_SITE_OTHER): Payer: Managed Care, Other (non HMO) | Admitting: Obstetrics & Gynecology

## 2012-11-18 ENCOUNTER — Encounter: Payer: Self-pay | Admitting: Obstetrics & Gynecology

## 2012-11-18 ENCOUNTER — Telehealth: Payer: Self-pay

## 2012-11-18 VITALS — BP 140/80 | Ht 66.0 in | Wt 170.0 lb

## 2012-11-18 DIAGNOSIS — Z309 Encounter for contraceptive management, unspecified: Secondary | ICD-10-CM

## 2012-11-18 DIAGNOSIS — Z3200 Encounter for pregnancy test, result unknown: Secondary | ICD-10-CM

## 2012-11-18 DIAGNOSIS — Z3202 Encounter for pregnancy test, result negative: Secondary | ICD-10-CM

## 2012-11-18 DIAGNOSIS — Z3049 Encounter for surveillance of other contraceptives: Secondary | ICD-10-CM

## 2012-11-18 MED ORDER — MEDROXYPROGESTERONE ACETATE 150 MG/ML IM SUSP
150.0000 mg | Freq: Once | INTRAMUSCULAR | Status: AC
  Administered 2012-11-18: 150 mg via INTRAMUSCULAR

## 2012-11-18 NOTE — Telephone Encounter (Signed)
Needs diabetic info sent to her

## 2012-12-02 ENCOUNTER — Ambulatory Visit (INDEPENDENT_AMBULATORY_CARE_PROVIDER_SITE_OTHER): Payer: Managed Care, Other (non HMO) | Admitting: Family Medicine

## 2012-12-02 ENCOUNTER — Encounter: Payer: Self-pay | Admitting: Family Medicine

## 2012-12-02 VITALS — BP 124/68 | HR 64 | Temp 98.5°F | Resp 18 | Ht 67.0 in | Wt 168.0 lb

## 2012-12-02 DIAGNOSIS — J329 Chronic sinusitis, unspecified: Secondary | ICD-10-CM

## 2012-12-02 DIAGNOSIS — E559 Vitamin D deficiency, unspecified: Secondary | ICD-10-CM

## 2012-12-02 DIAGNOSIS — R7309 Other abnormal glucose: Secondary | ICD-10-CM

## 2012-12-02 DIAGNOSIS — R7303 Prediabetes: Secondary | ICD-10-CM

## 2012-12-02 DIAGNOSIS — Z79899 Other long term (current) drug therapy: Secondary | ICD-10-CM

## 2012-12-02 DIAGNOSIS — I10 Essential (primary) hypertension: Secondary | ICD-10-CM

## 2012-12-02 MED ORDER — FLUCONAZOLE 150 MG PO TABS: ORAL_TABLET | ORAL | Status: DC

## 2012-12-02 MED ORDER — SULFAMETHOXAZOLE-TRIMETHOPRIM 800-160 MG PO TABS: 1.0000 | ORAL_TABLET | Freq: Two times a day (BID) | ORAL | Status: AC

## 2012-12-02 MED ORDER — SULFAMETHOXAZOLE-TRIMETHOPRIM 800-160 MG PO TABS: 1.0000 | ORAL_TABLET | Freq: Two times a day (BID) | ORAL | Status: DC

## 2012-12-02 NOTE — Patient Instructions (Addendum)
F/u in 6 month, call if need to be seen sooner.  Septra and fluconazole are prescribed for sinus infection.  OK to take sudafed and tylenol for symptom relief of sinus pressure and headache.  Work excuse for today to return in am  Fasting lipid, chem 7 and Vit D in July

## 2012-12-02 NOTE — Progress Notes (Signed)
  Subjective:    Patient ID: [name], female    DOB: 12-25-1970, 42 y.o.   MRN: [id number]  HPI  2 days ago started having headache and post nasal drainage which has worsened. Drainage is yellow, did not work today, has mild sore throat and , no sputum production Otherwise has been keeping well, inconsistent weight loss and behavior modification with no significant weight change  Review of Systems See HPI 1 day h/o chills , excessive fatigue, unable to work.  Denies chest pains, palpitations and leg swelling Denies abdominal pain, nausea, vomiting,diarrhea or constipation.   Denies dysuria, frequency, hesitancy or incontinence. Denies joint pain, swelling and limitation in mobility. Denies headaches, seizures, numbness, or tingling. Denies depression, anxiety or insomnia. Denies skin break down or rash.        Objective:   Physical Exam  Patient alert and oriented and in no cardiopulmonary distress.ill appearing  HEENT: No facial asymmetry, EOMI, maxillary  sinus tenderness,  oropharynx pink and moist.  Neck supple anterior cervical adenopathy.  Chest: Clear to auscultation bilaterally.  CVS: S1, S2 no murmurs, no S3.  ABD: Soft non tender. Bowel sounds normal.  Ext: No edema  MS: Adequate ROM spine, shoulders, hips and knees.  Skin: Intact, no ulcerations or rash noted.  Psych: Good eye contact, normal affect. Memory intact not anxious or depressed appearing.  CNS: CN 2-12 intact, power, tone and sensation normal throughout.       Assessment & Plan:

## 2012-12-04 NOTE — Assessment & Plan Note (Signed)
Updated lab in July, pt to continue meds

## 2012-12-04 NOTE — Assessment & Plan Note (Signed)
Antibiotic prescribed, pt to use OTC meds for symptom relief. Work excuse x 1 day

## 2012-12-04 NOTE — Assessment & Plan Note (Signed)
Updated lab in July Patient educated about the importance of limiting  Carbohydrate intake , the need to commit to daily physical activity for a minimum of 30 minutes , and to commit weight loss. The fact that changes in all these areas will reduce or eliminate all together the development of diabetes is stressed.

## 2012-12-04 NOTE — Assessment & Plan Note (Signed)
Controlled, no change in medication DASH diet and commitment to daily physical activity for a minimum of 30 minutes discussed and encouraged, as a part of hypertension management. The importance of attaining a healthy weight is also discussed.  

## 2012-12-07 ENCOUNTER — Ambulatory Visit: Payer: Managed Care, Other (non HMO) | Admitting: Family Medicine

## 2012-12-20 ENCOUNTER — Ambulatory Visit (HOSPITAL_COMMUNITY)
Admission: RE | Admit: 2012-12-20 | Discharge: 2012-12-20 | Disposition: A | Discharge status: Home / Self Care | Payer: Managed Care, Other (non HMO) | Source: Ambulatory Visit | Attending: Family Medicine | Admitting: Family Medicine

## 2012-12-20 DIAGNOSIS — Z139 Encounter for screening, unspecified: Secondary | ICD-10-CM

## 2012-12-20 DIAGNOSIS — Z1231 Encounter for screening mammogram for malignant neoplasm of breast: Secondary | ICD-10-CM | POA: Insufficient documentation

## 2013-01-27 ENCOUNTER — Other Ambulatory Visit (HOSPITAL_COMMUNITY): Payer: Self-pay | Admitting: "Endocrinology

## 2013-01-27 DIAGNOSIS — E041 Nontoxic single thyroid nodule: Secondary | ICD-10-CM

## 2013-01-29 ENCOUNTER — Other Ambulatory Visit: Payer: Self-pay | Admitting: Family Medicine

## 2013-01-31 ENCOUNTER — Ambulatory Visit (HOSPITAL_COMMUNITY): Payer: Managed Care, Other (non HMO)

## 2013-01-31 NOTE — Telephone Encounter (Signed)
Letter sent to patient to remind to schedule appt

## 2013-02-03 ENCOUNTER — Ambulatory Visit (HOSPITAL_COMMUNITY)
Admission: RE | Admit: 2013-02-03 | Discharge: 2013-02-03 | Disposition: A | Discharge status: Home / Self Care | Payer: Managed Care, Other (non HMO) | Source: Ambulatory Visit | Attending: "Endocrinology | Admitting: "Endocrinology

## 2013-02-03 DIAGNOSIS — E041 Nontoxic single thyroid nodule: Secondary | ICD-10-CM | POA: Insufficient documentation

## 2013-02-09 ENCOUNTER — Ambulatory Visit: Payer: Managed Care, Other (non HMO) | Admitting: Adult Health

## 2013-02-10 ENCOUNTER — Other Ambulatory Visit: Payer: Self-pay | Admitting: Family Medicine

## 2013-02-10 ENCOUNTER — Other Ambulatory Visit: Payer: Self-pay | Admitting: Adult Health

## 2013-02-10 ENCOUNTER — Encounter: Payer: Self-pay | Admitting: Adult Health

## 2013-02-10 ENCOUNTER — Ambulatory Visit (INDEPENDENT_AMBULATORY_CARE_PROVIDER_SITE_OTHER): Payer: Managed Care, Other (non HMO) | Admitting: Adult Health

## 2013-02-10 VITALS — BP 122/78 | Ht 66.0 in | Wt 171.2 lb

## 2013-02-10 DIAGNOSIS — Z309 Encounter for contraceptive management, unspecified: Secondary | ICD-10-CM

## 2013-02-10 DIAGNOSIS — Z3049 Encounter for surveillance of other contraceptives: Secondary | ICD-10-CM

## 2013-02-10 DIAGNOSIS — Z3202 Encounter for pregnancy test, result negative: Secondary | ICD-10-CM

## 2013-02-10 LAB — POCT URINE PREGNANCY: Preg Test, Ur: NEGATIVE

## 2013-02-10 MED ORDER — MEDROXYPROGESTERONE ACETATE 150 MG/ML IM SUSP
150.0000 mg | Freq: Once | INTRAMUSCULAR | Status: AC
  Administered 2013-02-10: 150 mg via INTRAMUSCULAR

## 2013-02-10 NOTE — Telephone Encounter (Signed)
Noted  

## 2013-02-10 NOTE — Telephone Encounter (Signed)
Can this be refilled? 

## 2013-02-28 ENCOUNTER — Other Ambulatory Visit: Payer: Self-pay | Admitting: Family Medicine

## 2013-03-02 ENCOUNTER — Telehealth: Payer: Self-pay | Admitting: Adult Health

## 2013-03-02 ENCOUNTER — Other Ambulatory Visit: Payer: Self-pay | Admitting: Obstetrics and Gynecology

## 2013-03-02 MED ORDER — FLUCONAZOLE 150 MG PO TABS: ORAL_TABLET | ORAL | Status: DC

## 2013-03-02 NOTE — Telephone Encounter (Signed)
Complains of yeast will Rx diflucan

## 2013-03-02 NOTE — Telephone Encounter (Signed)
Left message x 1. JSY 

## 2013-03-02 NOTE — Telephone Encounter (Signed)
Pt requesting Diflucan for yeast. Pt states was on Flagyl now having vaginal itching.

## 2013-04-18 ENCOUNTER — Ambulatory Visit: Payer: Managed Care, Other (non HMO) | Admitting: Adult Health

## 2013-05-01 ENCOUNTER — Other Ambulatory Visit: Payer: Self-pay | Admitting: Family Medicine

## 2013-05-05 ENCOUNTER — Ambulatory Visit: Payer: Managed Care, Other (non HMO)

## 2013-05-06 ENCOUNTER — Ambulatory Visit: Payer: Managed Care, Other (non HMO)

## 2013-05-12 ENCOUNTER — Other Ambulatory Visit: Payer: Self-pay

## 2013-05-18 ENCOUNTER — Telehealth: Payer: Self-pay

## 2013-05-18 ENCOUNTER — Other Ambulatory Visit: Payer: Self-pay | Admitting: Family Medicine

## 2013-05-18 ENCOUNTER — Other Ambulatory Visit: Payer: Self-pay

## 2013-05-18 MED ORDER — DILTIAZEM HCL ER COATED BEADS 120 MG PO CP24: ORAL_CAPSULE | ORAL | Status: DC

## 2013-05-18 NOTE — Telephone Encounter (Signed)
Requested meds refilled.

## 2013-06-03 ENCOUNTER — Encounter (HOSPITAL_COMMUNITY): Payer: Self-pay | Admitting: Emergency Medicine

## 2013-06-03 ENCOUNTER — Emergency Department (HOSPITAL_COMMUNITY)
Admission: EM | Admit: 2013-06-03 | Discharge: 2013-06-03 | Disposition: A | Discharge status: Home / Self Care | Payer: Managed Care, Other (non HMO) | Attending: Emergency Medicine | Admitting: Emergency Medicine

## 2013-06-03 DIAGNOSIS — R05 Cough: Secondary | ICD-10-CM | POA: Insufficient documentation

## 2013-06-03 DIAGNOSIS — I10 Essential (primary) hypertension: Secondary | ICD-10-CM | POA: Insufficient documentation

## 2013-06-03 DIAGNOSIS — Z792 Long term (current) use of antibiotics: Secondary | ICD-10-CM | POA: Insufficient documentation

## 2013-06-03 DIAGNOSIS — Z79899 Other long term (current) drug therapy: Secondary | ICD-10-CM | POA: Insufficient documentation

## 2013-06-03 DIAGNOSIS — R059 Cough, unspecified: Secondary | ICD-10-CM | POA: Insufficient documentation

## 2013-06-03 DIAGNOSIS — J0100 Acute maxillary sinusitis, unspecified: Secondary | ICD-10-CM

## 2013-06-03 DIAGNOSIS — Z8619 Personal history of other infectious and parasitic diseases: Secondary | ICD-10-CM | POA: Insufficient documentation

## 2013-06-03 DIAGNOSIS — R509 Fever, unspecified: Secondary | ICD-10-CM | POA: Insufficient documentation

## 2013-06-03 DIAGNOSIS — E669 Obesity, unspecified: Secondary | ICD-10-CM | POA: Insufficient documentation

## 2013-06-03 DIAGNOSIS — Z87728 Personal history of other specified (corrected) congenital malformations of nervous system and sense organs: Secondary | ICD-10-CM | POA: Insufficient documentation

## 2013-06-03 MED ORDER — AMOXICILLIN 250 MG PO CAPS
500.0000 mg | ORAL_CAPSULE | Freq: Once | ORAL | Status: AC
  Administered 2013-06-03: 500 mg via ORAL
  Filled 2013-06-03: qty 2

## 2013-06-03 MED ORDER — AMOXICILLIN 500 MG PO CAPS: 500.0000 mg | ORAL_CAPSULE | Freq: Three times a day (TID) | ORAL | Status: AC

## 2013-06-03 NOTE — ED Notes (Signed)
Sore throat, congestion. Prod cough with brown sputum. Nad. No resp distress noted.

## 2013-06-03 NOTE — ED Notes (Signed)
Cold sx  2-3 days, and sore throat with  Coughing, Hoarse.

## 2013-06-03 NOTE — ED Provider Notes (Signed)
CSN: [id number]     Arrival date & time 06/03/13  1212 History   First MD Initiated Contact with Patient 06/03/13 1254     Chief Complaint  Patient presents with  . Sore Throat   (Consider location/radiation/quality/duration/timing/severity/associated sxs/prior Treatment) HPI Comments: [name] is a 42 y.o. Female presenting with a 1 week history of uri type symptoms which include nasal congestion with thick yellow and bloody nasal discharge, post nasal drip with sore throat, facial pain, cough, subjective fever and headaches.  Symptoms due to not include earache or drainage, dizziness, facial swelling, shortness of breath, chest pain,  Nausea, vomiting or diarrhea.  The patient has taken zyrtec with no significant improvement in symptoms.      The history is provided by the patient.    Past Medical History  Diagnosis Date  . Thyroid nodule   . Back pain   . Chest pain     right   . Sleep disorder   . Obesity   . Hypertension   . Preterm labor   . Enlarged thyroid   . HSV-2 (herpes simplex virus 2) infection   . Encephalocele 1994   Past Surgical History  Procedure Laterality Date  . Btl  1994  . Tubal ligation reversal  2001  . Wisdom tooth extraction    . Breast biopsy    . Retained placenta removal     Family History  Problem Relation Age of Onset  . Anuerysm Mother   . Cancer Father     thyroid   . Hypertension Father   . Cancer Paternal Grandmother     breast  . Stroke Other   . Seizures Other    History  Substance Use Topics  . Smoking status: Never Smoker   . Smokeless tobacco: Never Used  . Alcohol Use: No   OB History   Grav Para Term Preterm Abortions TAB SAB Ect Mult Living   6 5 2 3 1 1  0 0 0 5     Review of Systems  Constitutional: Positive for fever. Negative for chills.  HENT: Positive for congestion, postnasal drip, rhinorrhea, sinus pressure and sore throat. Negative for dental problem, ear pain, facial swelling, hearing loss,  trouble swallowing and voice change.   Eyes: Negative for discharge.  Respiratory: Positive for cough. Negative for shortness of breath, wheezing and stridor.   Cardiovascular: Negative for chest pain.  Gastrointestinal: Negative for abdominal pain.  Genitourinary: Negative.     Allergies  Biaxin  Home Medications   Current Outpatient Rx  Name  Route  Sig  Dispense  Refill  . amoxicillin (AMOXIL) 500 MG capsule   Oral   Take 1 capsule (500 mg total) by mouth 3 (three) times daily.   30 capsule   0   . cetirizine (ZYRTEC) 10 MG tablet   Oral   Take 10 mg by mouth daily as needed. For allergy symptoms         . diltiazem (CARDIZEM CD) 120 MG 24 hr capsule      TAKE ONE CAPSULE BY MOUTH EVERY DAY   90 capsule   1   . fluconazole (DIFLUCAN) 150 MG tablet      TAKE ONE TABLET ONCE DAILY, AS NEEDED, FOR VAGINAL ITCH   2 tablet   3   . fluconazole (DIFLUCAN) 150 MG tablet      Take 1 now and 1 in 3 days   2 tablet   1   . hydrochlorothiazide (  HYDRODIURIL) 25 MG tablet      TAKE 1 TABLET BY MOUTH EVERY DAY   90 tablet   1   . KLOR-CON M20 20 MEQ tablet      TAKE 1 TABLET (20 MEQ TOTAL) BY MOUTH 3 (THREE) TIMES DAILY.   270 tablet   1   . medroxyPROGESTERone (DEPO-PROVERA) 150 MG/ML injection               . metoprolol tartrate (LOPRESSOR) 25 MG tablet      TAKE 1 TABLET EVERY MORNING AND 1/2 TABLET IN THE EVENING   135 tablet   0   . metroNIDAZOLE (FLAGYL) 500 MG tablet      TAKE 1 TABLET (500 MG TOTAL) BY MOUTH 2 (TWO) TIMES DAILY.   14 tablet   0    BP 124/75  Pulse 71  Temp(Src) 98.1 F (36.7 C) (Oral)  Resp 20  SpO2 100%  LMP 05/20/2013 Physical Exam  Constitutional: She is oriented to person, place, and time. She appears well-developed and well-nourished.  HENT:  Head: Normocephalic and atraumatic.  Right Ear: Tympanic membrane and ear canal normal.  Left Ear: Tympanic membrane and ear canal normal.  Nose: Mucosal edema and  rhinorrhea present. No epistaxis. Right sinus exhibits maxillary sinus tenderness.  Mouth/Throat: Uvula is midline, oropharynx is clear and moist and mucous membranes are normal. No oropharyngeal exudate, posterior oropharyngeal edema, posterior oropharyngeal erythema or tonsillar abscesses.  Eyes: Conjunctivae are normal.  Cardiovascular: Normal rate and normal heart sounds.   Pulmonary/Chest: Effort normal. No respiratory distress. She has no wheezes. She has no rales.  Abdominal: Soft. There is no tenderness.  Musculoskeletal: Normal range of motion.  Neurological: She is alert and oriented to person, place, and time.  Skin: Skin is warm and dry. No rash noted.  Psychiatric: She has a normal mood and affect.    ED Course  Procedures (including critical care time) Labs Review Labs Reviewed - No data to display Imaging Review No results found.  EKG Interpretation   None       MDM   1. Sinusitis, acute maxillary    Exam c/w sinusitis.  Amoxicillin prescribed,  Encouraged coricidin decongestant , increased fluid intake, warm compresses to face, steam, mints or menthol drops for congestion.  F/u with pcp if sx not improving.    Burgess Amor, PA-C 06/03/13 2235

## 2013-06-05 NOTE — ED Provider Notes (Signed)
Medical screening examination/treatment/procedure(s) were performed by non-physician practitioner and as supervising physician I was immediately available for consultation/collaboration.  EKG Interpretation   None        Sheina Mcleish, MD 06/05/13 0754 

## 2013-06-29 ENCOUNTER — Telehealth: Payer: Self-pay

## 2013-06-29 DIAGNOSIS — E559 Vitamin D deficiency, unspecified: Secondary | ICD-10-CM

## 2013-06-29 DIAGNOSIS — E041 Nontoxic single thyroid nodule: Secondary | ICD-10-CM

## 2013-06-29 DIAGNOSIS — I10 Essential (primary) hypertension: Secondary | ICD-10-CM

## 2013-06-29 DIAGNOSIS — R7303 Prediabetes: Secondary | ICD-10-CM

## 2013-07-03 NOTE — Telephone Encounter (Addendum)
Pls order fasting lipid, chem 7 , HBA1C, vit D and vit D, dx 401.9, prediabetes, vit d def thanks

## 2013-07-04 NOTE — Telephone Encounter (Signed)
Patient aware and lab order faxed over to [facility].

## 2013-07-04 NOTE — Addendum Note (Signed)
Addended by: Kandis Fantasia B on: 07/04/2013 09:35 AM   Modules accepted: Orders

## 2013-07-20 ENCOUNTER — Encounter: Payer: Self-pay | Admitting: Adult Health

## 2013-07-20 ENCOUNTER — Ambulatory Visit (INDEPENDENT_AMBULATORY_CARE_PROVIDER_SITE_OTHER): Payer: Managed Care, Other (non HMO) | Admitting: Adult Health

## 2013-07-20 VITALS — BP 120/68 | HR 72 | Ht 67.0 in | Wt 176.0 lb

## 2013-07-20 DIAGNOSIS — Z1212 Encounter for screening for malignant neoplasm of rectum: Secondary | ICD-10-CM

## 2013-07-20 DIAGNOSIS — Z01419 Encounter for gynecological examination (general) (routine) without abnormal findings: Secondary | ICD-10-CM

## 2013-07-20 LAB — HEMOCCULT GUIAC POC 1CARD (OFFICE): Fecal Occult Blood, POC: NEGATIVE

## 2013-07-20 MED ORDER — FLUCONAZOLE 150 MG PO TABS: ORAL_TABLET | ORAL | Status: DC

## 2013-07-20 NOTE — Patient Instructions (Signed)
Physical in 1 year Mammogram yearly  

## 2013-07-20 NOTE — Progress Notes (Signed)
Patient ID: [name], female   DOB: 1970-09-21, 43 y.o.   MRN: [id number] History of Present Illness:  [name] is a 43 year old black female in for a physical.She had a normal pap with negative HPV 06/10/12.Her periods have been irregular since coming off depo, she is not having sex currently.  Current Medications, Allergies, Past Medical History, Past Surgical History, Family History and Social History were reviewed in [employer] record.   Past Medical History  Diagnosis Date  . Thyroid nodule   . Back pain   . Chest pain     right   . Sleep disorder   . Obesity   . Hypertension   . Preterm labor   . Enlarged thyroid   . HSV-2 (herpes simplex virus 2) infection   . Encephalocele 1994   Past Surgical History  Procedure Laterality Date  . Btl  1994  . Tubal ligation reversal  2001  . Wisdom tooth extraction    . Breast biopsy    . Retained placenta removal    Current outpatient prescriptions:amoxicillin (AMOXIL) 500 MG capsule, Take 500 mg by mouth 2 (two) times daily. , Disp: , Rfl: ;  diltiazem (CARDIZEM CD) 120 MG 24 hr capsule, TAKE ONE CAPSULE BY MOUTH EVERY DAY, Disp: 90 capsule, Rfl: 1;  hydrochlorothiazide (HYDRODIURIL) 25 MG tablet, TAKE 1 TABLET BY MOUTH EVERY DAY, Disp: 90 tablet, Rfl: 1 KLOR-CON M20 20 MEQ tablet, TAKE 1 TABLET (20 MEQ TOTAL) BY MOUTH 3 (THREE) TIMES DAILY., Disp: 270 tablet, Rfl: 1;  metoprolol tartrate (LOPRESSOR) 25 MG tablet, TAKE 1 TABLET EVERY MORNING AND 1/2 TABLET IN THE EVENING, Disp: 135 tablet, Rfl: 0;  cetirizine (ZYRTEC) 10 MG tablet, Take 10 mg by mouth daily as needed. For allergy symptoms, Disp: , Rfl: ;  fluconazole (DIFLUCAN) 150 MG tablet, Take 1 now and 1 in 3 days, Disp: 2 tablet, Rfl: 1  Review of Systems: Patient denies any headaches, blurred vision, shortness of breath, chest pain, abdominal pain, problems with bowel movements, urination, or intercourse. No joint pain or mood changes.Has had sinus infection and  wants refill on  Diflucan.    Physical Exam:BP 120/68  Pulse 72  Ht 5\' 7"  (1.702 m)  Wt 176 lb (79.833 kg)  BMI 27.56 kg/m2  LMP 05/16/2013 General:  Well developed, well nourished, no acute distress Skin:  Warm and dry Neck:  Midline trachea,  Thyroid enlarged ,had [place] July +nodules, 1 solid, and needs f/u in 1 year Lungs; Clear to auscultation bilaterally Breast:  No dominant palpable mass, retraction, or nipple discharge Cardiovascular: Regular rate and rhythm Abdomen:  Soft, non tender, no hepatosplenomegaly Pelvic:  External genitalia is normal in appearance.  The vagina is normal in appearance. The cervix is bulbous.  Uterus is felt to be normal size, shape, and contour.  No adnexal masses or tenderness noted. Rectal: Good sphincter tone, no polyps, or hemorrhoids felt.  Hemoccult negative. Extremities:  No swelling or varicosities noted Psych:  No mood changes, alert and cooperatives, seems happy   Impression: Yearly gyn exam no pap    Plan: Physical in 1 year Mammogram yearly Labs with PCP Call prn Refilled diflucan x 2

## 2013-07-23 LAB — BASIC METABOLIC PANEL
BUN: 9 mg/dL (ref 6–23)
CO2: 28 mEq/L (ref 19–32)
Calcium: 9 mg/dL (ref 8.4–10.5)
Chloride: 100 mEq/L (ref 96–112)
Creat: 0.85 mg/dL (ref 0.50–1.10)
Glucose, Bld: 79 mg/dL (ref 70–99)
Potassium: 2.9 mEq/L — ABNORMAL LOW (ref 3.5–5.3)
Sodium: 137 mEq/L (ref 135–145)

## 2013-07-23 LAB — LIPID PANEL
Cholesterol: 130 mg/dL (ref 0–200)
HDL: 38 mg/dL — ABNORMAL LOW (ref >39)
LDL Cholesterol: 79 mg/dL (ref 0–99)
Total CHOL/HDL Ratio: 3.4 Ratio
Triglycerides: 67 mg/dL (ref <150)
VLDL: 13 mg/dL (ref 0–40)

## 2013-07-24 ENCOUNTER — Other Ambulatory Visit: Payer: Self-pay | Admitting: Family Medicine

## 2013-07-24 LAB — HEMOGLOBIN A1C
Hgb A1c MFr Bld: 5.6 % (ref <5.7)
Mean Plasma Glucose: 114 mg/dL (ref <117)

## 2013-07-24 MED ORDER — POTASSIUM CHLORIDE CRYS ER 20 MEQ PO TBCR: 20.0000 meq | EXTENDED_RELEASE_TABLET | Freq: Three times a day (TID) | ORAL | Status: DC

## 2013-07-25 ENCOUNTER — Ambulatory Visit (INDEPENDENT_AMBULATORY_CARE_PROVIDER_SITE_OTHER): Payer: Managed Care, Other (non HMO) | Admitting: Family Medicine

## 2013-07-25 ENCOUNTER — Encounter: Payer: Self-pay | Admitting: Family Medicine

## 2013-07-25 VITALS — BP 120/78 | HR 69 | Resp 16 | Ht 67.0 in | Wt 172.4 lb

## 2013-07-25 DIAGNOSIS — E559 Vitamin D deficiency, unspecified: Secondary | ICD-10-CM

## 2013-07-25 DIAGNOSIS — E663 Overweight: Secondary | ICD-10-CM

## 2013-07-25 DIAGNOSIS — I10 Essential (primary) hypertension: Secondary | ICD-10-CM

## 2013-07-25 DIAGNOSIS — R7303 Prediabetes: Secondary | ICD-10-CM

## 2013-07-25 DIAGNOSIS — R7309 Other abnormal glucose: Secondary | ICD-10-CM

## 2013-07-25 DIAGNOSIS — E876 Hypokalemia: Secondary | ICD-10-CM

## 2013-07-25 DIAGNOSIS — E041 Nontoxic single thyroid nodule: Secondary | ICD-10-CM

## 2013-07-25 LAB — VITAMIN D 25 HYDROXY (VIT D DEFICIENCY, FRACTURES): Vit D, 25-Hydroxy: 12 ng/mL — ABNORMAL LOW (ref 30–89)

## 2013-07-25 NOTE — Assessment & Plan Note (Signed)
Deteriorated. Patient re-educated about  the importance of commitment to a  minimum of 150 minutes of exercise per week. The importance of healthy food choices with portion control discussed. Encouraged to start a food diary, count calories and to consider  joining a support group. Sample diet sheets offered. Goals set by the patient for the next several months.    

## 2013-07-25 NOTE — Assessment & Plan Note (Signed)
Recurrent prob due to meduication non compliance, increased dose and rept next Saturday

## 2013-07-25 NOTE — Assessment & Plan Note (Signed)
non compliant, will take med and rept in 6 m,onth

## 2013-07-25 NOTE — Assessment & Plan Note (Signed)
Needs to make appt with endo for f/u

## 2013-07-25 NOTE — Assessment & Plan Note (Signed)
Currently corrected, pt applauded on this Recheck in 6 month

## 2013-07-25 NOTE — Patient Instructions (Addendum)
F/u in 6 month,6 month, call if you need me before   Take potassium 4 times daily for the next 4 days, then one three times daily as prescribed    Congrats on normal blood sugar due to change in eating  Potassium is VERY LOW, you need to consistently take the medication, low potassium can put your heart in a potentially life threatening rhythm  Rept potassium next week Saturday, Jan 31   Fasting chem 7 , and HBa1C and Vit D in 6 month  You need to take weekly vit D for 6 month  Please schedule appt with Dr Dorris Fetch to f/u thyroid nodule

## 2013-07-25 NOTE — Assessment & Plan Note (Signed)
Controlled, no change in medication DASH diet and commitment to daily physical activity for a minimum of 30 minutes discussed and encouraged, as a part of hypertension management. The importance of attaining a healthy weight is also discussed.  

## 2013-07-25 NOTE — Progress Notes (Signed)
   Subjective:    Patient ID: [name], female    DOB: 1970-07-12, 43 y.o.   MRN: [id number]  HPI The PT is here for follow up and re-evaluation of chronic medical conditions, medication management and review of any available recent lab and radiology data.  Preventive health is updated, specifically  Cancer screening and Immunization.   Questions or concerns regarding consultations or procedures which the PT has had in the interim are  addressed. The PT denies any adverse reactions to current medications since the last visit.  There are no new concerns.  There are no specific complaints       Review of Systems See HPI Denies recent fever or chills. Denies sinus pressure, nasal congestion, ear pain or sore throat. Denies chest congestion, productive cough or wheezing. Denies chest pains, palpitations and leg swelling Denies abdominal pain, nausea, vomiting,diarrhea or constipation.   Denies dysuria, frequency, hesitancy or incontinence. Denies joint pain, swelling and limitation in mobility. Denies headaches, seizures, numbness, or tingling. Denies depression, anxiety or insomnia. Denies skin break down or rash.        Objective:   Physical Exam Patient alert and oriented and in no cardiopulmonary distress.  HEENT: No facial asymmetry, EOMI, no sinus tenderness,  oropharynx pink and moist.  Neck supple no adenopathy.  Chest: Clear to auscultation bilaterally.  CVS: S1, S2 no murmurs, no S3.  ABD: Soft non tender. Bowel sounds normal.  Ext: No edema  MS: Adequate ROM spine, shoulders, hips and knees.  Skin: Intact, no ulcerations or rash noted.  Psych: Good eye contact, normal affect. Memory intact not anxious or depressed appearing.  CNS: CN 2-12 intact, power, tone and sensation normal throughout.        Assessment & Plan:

## 2013-07-30 LAB — POTASSIUM: POTASSIUM: 3.7 meq/L (ref 3.5–5.3)

## 2013-07-31 ENCOUNTER — Encounter: Payer: Self-pay | Admitting: Family Medicine

## 2013-08-03 ENCOUNTER — Other Ambulatory Visit: Payer: Self-pay | Admitting: Family Medicine

## 2013-08-25 ENCOUNTER — Telehealth: Payer: Self-pay | Admitting: Adult Health

## 2013-08-25 NOTE — Telephone Encounter (Signed)
Pt stopped depo and has not had a period. How long before she should have a period. Informed pt was hard to predict could take several months. Pt verbalized understanding to call office back as needed.

## 2013-09-02 ENCOUNTER — Other Ambulatory Visit: Payer: Self-pay | Admitting: Family Medicine

## 2013-09-16 ENCOUNTER — Encounter: Payer: Self-pay | Admitting: Adult Health

## 2013-09-16 ENCOUNTER — Other Ambulatory Visit: Payer: Self-pay | Admitting: Adult Health

## 2013-09-16 ENCOUNTER — Ambulatory Visit (INDEPENDENT_AMBULATORY_CARE_PROVIDER_SITE_OTHER): Payer: Managed Care, Other (non HMO) | Admitting: Adult Health

## 2013-09-16 VITALS — BP 130/82 | Ht 66.0 in | Wt 174.0 lb

## 2013-09-16 DIAGNOSIS — Z3049 Encounter for surveillance of other contraceptives: Secondary | ICD-10-CM

## 2013-09-16 DIAGNOSIS — Z309 Encounter for contraceptive management, unspecified: Secondary | ICD-10-CM

## 2013-09-16 DIAGNOSIS — N898 Other specified noninflammatory disorders of vagina: Secondary | ICD-10-CM

## 2013-09-16 DIAGNOSIS — A499 Bacterial infection, unspecified: Secondary | ICD-10-CM

## 2013-09-16 DIAGNOSIS — Z3202 Encounter for pregnancy test, result negative: Secondary | ICD-10-CM

## 2013-09-16 DIAGNOSIS — Z3200 Encounter for pregnancy test, result unknown: Secondary | ICD-10-CM

## 2013-09-16 DIAGNOSIS — N760 Acute vaginitis: Secondary | ICD-10-CM

## 2013-09-16 DIAGNOSIS — Z09 Encounter for follow-up examination after completed treatment for conditions other than malignant neoplasm: Secondary | ICD-10-CM | POA: Insufficient documentation

## 2013-09-16 DIAGNOSIS — B9689 Other specified bacterial agents as the cause of diseases classified elsewhere: Secondary | ICD-10-CM

## 2013-09-16 HISTORY — DX: Encounter for contraceptive management, unspecified: Z30.9

## 2013-09-16 HISTORY — DX: Other specified bacterial agents as the cause of diseases classified elsewhere: B96.89

## 2013-09-16 HISTORY — DX: Other specified noninflammatory disorders of vagina: N89.8

## 2013-09-16 LAB — POCT URINE PREGNANCY: Preg Test, Ur: NEGATIVE

## 2013-09-16 MED ORDER — METRONIDAZOLE 500 MG PO TABS: 500.0000 mg | ORAL_TABLET | Freq: Two times a day (BID) | ORAL | Status: DC

## 2013-09-16 MED ORDER — MEDROXYPROGESTERONE ACETATE 150 MG/ML IM SUSP: 150.0000 mg | INTRAMUSCULAR | Status: DC

## 2013-09-16 MED ORDER — MEDROXYPROGESTERONE ACETATE 150 MG/ML IM SUSP
150.0000 mg | Freq: Once | INTRAMUSCULAR | Status: AC
  Administered 2013-09-16: 150 mg via INTRAMUSCULAR

## 2013-09-16 MED ORDER — FLUCONAZOLE 150 MG PO TABS: ORAL_TABLET | ORAL | Status: DC

## 2013-09-16 NOTE — Patient Instructions (Signed)
Medroxyprogesterone injection [Contraceptive] What is this medicine? MEDROXYPROGESTERONE (me DROX ee proe JES te rone) contraceptive injections prevent pregnancy. They provide effective birth control for 3 months. Depo-subQ Provera 104 is also used for treating pain related to endometriosis. This medicine may be used for other purposes; ask your health care provider or pharmacist if you have questions. COMMON BRAND NAME(S): Depo-Provera, Depo-subQ Provera 104 What should I tell my health care provider before I take this medicine? They need to know if you have any of these conditions: -frequently drink alcohol -asthma -blood vessel disease or a history of a blood clot in the lungs or legs -bone disease such as osteoporosis -breast cancer -diabetes -eating disorder (anorexia nervosa or bulimia) -high blood pressure -HIV infection or AIDS -kidney disease -liver disease -mental depression -migraine -seizures (convulsions) -stroke -tobacco smoker -vaginal bleeding -an unusual or allergic reaction to medroxyprogesterone, other hormones, medicines, foods, dyes, or preservatives -pregnant or trying to get pregnant -breast-feeding How should I use this medicine? Depo-Provera Contraceptive injection is given into a muscle. Depo-subQ Provera 104 injection is given under the skin. These injections are given by a health care professional. You must not be pregnant before getting an injection. The injection is usually given during the first 5 days after the start of a menstrual period or 6 weeks after delivery of a baby. Talk to your pediatrician regarding the use of this medicine in children. Special care may be needed. These injections have been used in female children who have started having menstrual periods. Overdosage: If you think you have taken too much of this medicine contact a poison control center or emergency room at once. NOTE: This medicine is only for you. Do not share this medicine  with others. What if I miss a dose? Try not to miss a dose. You must get an injection once every 3 months to maintain birth control. If you cannot keep an appointment, call and reschedule it. If you wait longer than 13 weeks between Depo-Provera contraceptive injections or longer than 14 weeks between Depo-subQ Provera 104 injections, you could get pregnant. Use another method for birth control if you miss your appointment. You may also need a pregnancy test before receiving another injection. What may interact with this medicine? Do not take this medicine with any of the following medications: -bosentan This medicine may also interact with the following medications: -aminoglutethimide -antibiotics or medicines for infections, especially rifampin, rifabutin, rifapentine, and griseofulvin -aprepitant -barbiturate medicines such as phenobarbital or primidone -bexarotene -carbamazepine -medicines for seizures like ethotoin, felbamate, oxcarbazepine, phenytoin, topiramate -modafinil -St. John's wort This list may not describe all possible interactions. Give your health care provider a list of all the medicines, herbs, non-prescription drugs, or dietary supplements you use. Also tell them if you smoke, drink alcohol, or use illegal drugs. Some items may interact with your medicine. What should I watch for while using this medicine? This drug does not protect you against HIV infection (AIDS) or other sexually transmitted diseases. Use of this product may cause you to lose calcium from your bones. Loss of calcium may cause weak bones (osteoporosis). Only use this product for more than 2 years if other forms of birth control are not right for you. The longer you use this product for birth control the more likely you will be at risk for weak bones. Ask your health care professional how you can keep strong bones. You may have a change in bleeding pattern or irregular periods. Many females stop having    periods while taking this drug. If you have received your injections on time, your chance of being pregnant is very low. If you think you may be pregnant, see your health care professional as soon as possible. Tell your health care professional if you want to get pregnant within the next year. The effect of this medicine may last a long time after you get your last injection. What side effects may I notice from receiving this medicine? Side effects that you should report to your doctor or health care professional as soon as possible: -allergic reactions like skin rash, itching or hives, swelling of the face, lips, or tongue -breast tenderness or discharge -breathing problems -changes in vision -depression -feeling faint or lightheaded, falls -fever -pain in the abdomen, chest, groin, or leg -problems with balance, talking, walking -unusually weak or tired -yellowing of the eyes or skin Side effects that usually do not require medical attention (report to your doctor or health care professional if they continue or are bothersome): -acne -fluid retention and swelling -headache -irregular periods, spotting, or absent periods -temporary pain, itching, or skin reaction at site where injected -weight gain This list may not describe all possible side effects. Call your doctor for medical advice about side effects. You may report side effects to FDA at 1-800-FDA-1088. Where should I keep my medicine? This does not apply. The injection will be given to you by a health care professional. NOTE: This sheet is a summary. It may not cover all possible information. If you have questions about this medicine, talk to your doctor, pharmacist, or health care provider.  2014, Elsevier/Gold Standard. (2008-07-14 18:37:56) Bacterial Vaginosis Bacterial vaginosis is a vaginal infection that occurs when the normal balance of bacteria in the vagina is disrupted. It results from an overgrowth of certain  bacteria. This is the most common vaginal infection in women of childbearing age. Treatment is important to prevent complications, especially in pregnant women, as it can cause a premature delivery. CAUSES  Bacterial vaginosis is caused by an increase in harmful bacteria that are normally present in smaller amounts in the vagina. Several different kinds of bacteria can cause bacterial vaginosis. However, the reason that the condition develops is not fully understood. RISK FACTORS Certain activities or behaviors can put you at an increased risk of developing bacterial vaginosis, including:  Having a new sex partner or multiple sex partners.  Douching.  Using an intrauterine device (IUD) for contraception. Women do not get bacterial vaginosis from toilet seats, bedding, swimming pools, or contact with objects around them. SIGNS AND SYMPTOMS  Some women with bacterial vaginosis have no signs or symptoms. Common symptoms include:  Grey vaginal discharge.  A fishlike odor with discharge, especially after sexual intercourse.  Itching or burning of the vagina and vulva.  Burning or pain with urination. DIAGNOSIS  Your health care provider will take a medical history and examine the vagina for signs of bacterial vaginosis. A sample of vaginal fluid may be taken. Your health care provider will look at this sample under a microscope to check for bacteria and abnormal cells. A vaginal pH test may also be done.  TREATMENT  Bacterial vaginosis may be treated with antibiotic medicines. These may be given in the form of a pill or a vaginal cream. A second round of antibiotics may be prescribed if the condition comes back after treatment.  HOME CARE INSTRUCTIONS   Only take over-the-counter or prescription medicines as directed by your health care provider.  If antibiotic  medicine was prescribed, take it as directed. Make sure you finish it even if you start to feel better.  Do not have sex until  treatment is completed.  Tell all sexual partners that you have a vaginal infection. They should see their health care provider and be treated if they have problems, such as a mild rash or itching.  Practice safe sex by using condoms and only having one sex partner. SEEK MEDICAL CARE IF:   Your symptoms are not improving after 3 days of treatment.  You have increased discharge or pain.  You have a fever. MAKE SURE YOU:   Understand these instructions.  Will watch your condition.  Will get help right away if you are not doing well or get worse. FOR MORE INFORMATION  Centers for Disease Control and Prevention, Division of STD Prevention: Appraiser[employer]raud[employer] American Sexual Health Association (ASHA): www.ashastd.org  Document Released: 06/23/2005 Document Revised: 04/13/2013 Document Reviewed: 02/02/2013 [facility] Patient Information 2014 [place], [place]. tak flagyl and no alcohol Come back this pm for  depo

## 2013-09-16 NOTE — Progress Notes (Signed)
Subjective:     Patient ID: [name], female   DOB: 05/27/1971, 43 y.o.   MRN: [id number]  HPI [name] is a 43 year old black female in for vaginal discharge and wants to get back on depo.Last sex in November, periods nor regular,spotted last 2 days.  Review of Systems See HPI Reviewed past medical,surgical, social and family history. Reviewed medications and allergies.     Objective:   Physical Exam BP 130/82  Ht 5\' 6"  (1.676 m)  Wt 174 lb (78.926 kg)  BMI 28.10 kg/m2  LMP 03/11/2015UPT negative Skin warm and dry.Pelvic: external genitalia is normal in appearance, vagina: pinkish discharge with odor, cervix:smooth and bulbous, uterus: normal size, shape and contour, non tender, no masses felt, adnexa: no masses or tenderness noted. Wet prep: + for clue cells and +WBCs. GC/CHL obtained.     Assessment:     Vaginal discharge BV Contraceptive management    Plan:    GC/CHL Rx flagyl 500 mg 1 bid x 7 days, no alcohol, review handout on BV   Rx depo provera disp 1 vial for IM injection in office with 4 refills Return this pm for depo shot Review handout on depo

## 2013-09-17 LAB — GC/CHLAMYDIA PROBE AMP
CT Probe RNA: NEGATIVE
GC Probe RNA: NEGATIVE

## 2013-10-13 ENCOUNTER — Other Ambulatory Visit: Payer: Self-pay | Admitting: Family Medicine

## 2013-10-17 ENCOUNTER — Telehealth: Payer: Self-pay | Admitting: Adult Health

## 2013-10-17 MED ORDER — METRONIDAZOLE 500 MG PO TABS: 500.0000 mg | ORAL_TABLET | Freq: Two times a day (BID) | ORAL | Status: DC

## 2013-10-17 NOTE — Telephone Encounter (Signed)
Did not finish dose of flagyl will rx again

## 2013-10-17 NOTE — Telephone Encounter (Signed)
Pt states was given Metronidazole from Derrek Monaco, NP. Pt took all but 8 of the metronidazole tablets due to drinking alcohol for graduation. Pt states now having same symptoms as before.

## 2013-11-02 ENCOUNTER — Other Ambulatory Visit: Payer: Self-pay | Admitting: Family Medicine

## 2013-12-07 ENCOUNTER — Telehealth: Payer: Self-pay | Admitting: *Deleted

## 2013-12-07 NOTE — Telephone Encounter (Signed)
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 Pt stated that she would try and run by here Thursday.

## 2013-12-08 ENCOUNTER — Ambulatory Visit (INDEPENDENT_AMBULATORY_CARE_PROVIDER_SITE_OTHER): Payer: Managed Care, Other (non HMO) | Admitting: Adult Health

## 2013-12-08 ENCOUNTER — Other Ambulatory Visit: Payer: Self-pay | Admitting: Family Medicine

## 2013-12-08 ENCOUNTER — Encounter: Payer: Self-pay | Admitting: Adult Health

## 2013-12-08 VITALS — BP 120/76 | Ht 67.0 in | Wt 170.0 lb

## 2013-12-08 DIAGNOSIS — Z3200 Encounter for pregnancy test, result unknown: Secondary | ICD-10-CM

## 2013-12-08 DIAGNOSIS — Z3202 Encounter for pregnancy test, result negative: Secondary | ICD-10-CM

## 2013-12-08 DIAGNOSIS — Z3049 Encounter for surveillance of other contraceptives: Secondary | ICD-10-CM

## 2013-12-08 DIAGNOSIS — Z309 Encounter for contraceptive management, unspecified: Secondary | ICD-10-CM

## 2013-12-08 LAB — POCT URINE PREGNANCY: Preg Test, Ur: NEGATIVE

## 2013-12-08 MED ORDER — MEDROXYPROGESTERONE ACETATE 150 MG/ML IM SUSP
150.0000 mg | Freq: Once | INTRAMUSCULAR | Status: AC
  Administered 2013-12-08: 150 mg via INTRAMUSCULAR

## 2013-12-08 NOTE — Progress Notes (Signed)
Patient ID: [name], female   DOB: August 15, 1970, 43 y.o.   MRN: [id number] Pt here today for DEPO injection. Pt denies any problems at this time. Shot given and pt to return in 12 weeks for next DEPO injection.

## 2013-12-09 ENCOUNTER — Ambulatory Visit: Payer: Managed Care, Other (non HMO)

## 2013-12-12 ENCOUNTER — Other Ambulatory Visit: Payer: Self-pay | Admitting: Family Medicine

## 2013-12-20 ENCOUNTER — Telehealth: Payer: Self-pay | Admitting: *Deleted

## 2013-12-20 DIAGNOSIS — I10 Essential (primary) hypertension: Secondary | ICD-10-CM

## 2013-12-20 DIAGNOSIS — E559 Vitamin D deficiency, unspecified: Secondary | ICD-10-CM

## 2013-12-20 DIAGNOSIS — R7303 Prediabetes: Secondary | ICD-10-CM

## 2013-12-20 NOTE — Telephone Encounter (Signed)
Pt called stating she needs to be seen to change her BP Pill because per pt they are discontinuing it, pt wanted to be seen 01/05/14 I told pt I did not have anything open on this day pt said she is only off Thursdays and fridays, pt wants to know if Dr. Moshe Cipro will work her in. Please advise

## 2013-12-21 NOTE — Telephone Encounter (Signed)
Pls call [name] and offer appt for 7/9, as long as she is still has Bp meds up to that day, that is best option.   Ask that she send t me the info to the new med options available , I can start her on this and then see her for f/u on 7/9 or 7/16, let me know response pls

## 2013-12-26 NOTE — Telephone Encounter (Signed)
I still recommend she start new medication I will recommend if she is unable to find the cardizem, and take it for at least 3 weeks before coming in for assesment of the new medication, she does not need an OV to start the new med, she needs an OV to establishe that the new med dose is appropriate as far as control and tolerability. I recommend amlodipine 2.5mg  once daily #30 only be sent in place of her cardizem 120mg  tablet and she gets an appt for 3 to 4 weeks from start date,  Pls send me f/u on this  Needs fasting chem 7, Vit D and CBC for f/u visit in next 3 to 4 weeks also pls order and let her know

## 2013-12-26 NOTE — Telephone Encounter (Signed)
Spoke with patient and she states that she has been receiving Cardizem without CD from different pharmacies that have it available.  She states that she is unable to tolerated Cardizem CD 120.  Will call around to local pharmacies to see if available.  Otherwise patient will have to have change in medication.  Is only available to come in on 7/2 due to work responsibilities.  Patient is to call back on 6/23 to notify nurse how many days supply she has left of Cardizem.

## 2013-12-27 NOTE — Addendum Note (Signed)
Addended by: Denman George B on: 12/27/2013 08:05 AM   Modules accepted: Orders

## 2013-12-27 NOTE — Telephone Encounter (Signed)
Patient is aware.  States that she has enough of Cardizem to last until 7/14.  She will continue this until she finishes.  She will call 2 days ahead for Amlodipine to be sent to pharmacy.  She will then schedule followup visit.

## 2014-01-09 ENCOUNTER — Other Ambulatory Visit: Payer: Self-pay | Admitting: Family Medicine

## 2014-01-09 DIAGNOSIS — Z1231 Encounter for screening mammogram for malignant neoplasm of breast: Secondary | ICD-10-CM

## 2014-01-11 ENCOUNTER — Other Ambulatory Visit: Payer: Self-pay

## 2014-01-11 MED ORDER — DILTIAZEM HCL ER COATED BEADS 120 MG PO CP24
ORAL_CAPSULE | ORAL | Status: DC
Start: 2014-01-11 — End: 2014-02-19

## 2014-01-16 ENCOUNTER — Ambulatory Visit (HOSPITAL_COMMUNITY): Payer: Managed Care, Other (non HMO)

## 2014-01-16 ENCOUNTER — Ambulatory Visit (HOSPITAL_COMMUNITY)
Admission: RE | Admit: 2014-01-16 | Discharge: 2014-01-16 | Disposition: A | Discharge status: Home / Self Care | Payer: 59 | Source: Ambulatory Visit | Attending: Family Medicine | Admitting: Family Medicine

## 2014-01-16 DIAGNOSIS — Z1231 Encounter for screening mammogram for malignant neoplasm of breast: Secondary | ICD-10-CM | POA: Insufficient documentation

## 2014-02-19 ENCOUNTER — Other Ambulatory Visit: Payer: Self-pay | Admitting: Family Medicine

## 2014-03-02 ENCOUNTER — Ambulatory Visit: Payer: Managed Care, Other (non HMO)

## 2014-03-10 ENCOUNTER — Encounter: Payer: Self-pay | Admitting: Obstetrics and Gynecology

## 2014-03-10 ENCOUNTER — Ambulatory Visit (INDEPENDENT_AMBULATORY_CARE_PROVIDER_SITE_OTHER): Payer: 59 | Admitting: Obstetrics and Gynecology

## 2014-03-10 DIAGNOSIS — Z3042 Encounter for surveillance of injectable contraceptive: Secondary | ICD-10-CM

## 2014-03-10 DIAGNOSIS — Z3202 Encounter for pregnancy test, result negative: Secondary | ICD-10-CM

## 2014-03-10 DIAGNOSIS — Z7689 Persons encountering health services in other specified circumstances: Secondary | ICD-10-CM

## 2014-03-10 DIAGNOSIS — Z3049 Encounter for surveillance of other contraceptives: Secondary | ICD-10-CM

## 2014-03-10 LAB — POCT URINE PREGNANCY: Preg Test, Ur: NEGATIVE

## 2014-03-10 MED ORDER — MEDROXYPROGESTERONE ACETATE 150 MG/ML IM SUSP
150.0000 mg | Freq: Once | INTRAMUSCULAR | Status: AC
  Administered 2014-03-10: 150 mg via INTRAMUSCULAR

## 2014-03-15 ENCOUNTER — Other Ambulatory Visit: Payer: Self-pay | Admitting: Family Medicine

## 2014-03-17 ENCOUNTER — Other Ambulatory Visit: Payer: Self-pay | Admitting: Family Medicine

## 2014-04-24 ENCOUNTER — Other Ambulatory Visit: Payer: Self-pay | Admitting: Family Medicine

## 2014-04-26 ENCOUNTER — Other Ambulatory Visit: Payer: Self-pay | Admitting: Family Medicine

## 2014-04-28 ENCOUNTER — Other Ambulatory Visit: Payer: Self-pay | Admitting: Family Medicine

## 2014-05-01 ENCOUNTER — Other Ambulatory Visit: Payer: Self-pay

## 2014-05-01 MED ORDER — METOPROLOL TARTRATE 25 MG PO TABS: ORAL_TABLET | ORAL | Status: DC

## 2014-05-01 MED ORDER — HYDROCHLOROTHIAZIDE 25 MG PO TABS: ORAL_TABLET | ORAL | Status: DC

## 2014-05-01 MED ORDER — DILTIAZEM HCL ER COATED BEADS 120 MG PO CP24: ORAL_CAPSULE | ORAL | Status: DC

## 2014-05-08 ENCOUNTER — Encounter: Payer: Self-pay | Admitting: Obstetrics and Gynecology

## 2014-05-16 ENCOUNTER — Telehealth: Payer: Self-pay | Admitting: *Deleted

## 2014-05-16 NOTE — Telephone Encounter (Signed)
Pt called wanting to be seen pt states her throat is hurting, her stomach is hurting, patient states she feels bad. Please advise [phone]

## 2014-05-16 NOTE — Telephone Encounter (Signed)
Coming for appt tomorrow  

## 2014-05-17 ENCOUNTER — Encounter: Payer: Self-pay | Admitting: Family Medicine

## 2014-05-17 ENCOUNTER — Ambulatory Visit (INDEPENDENT_AMBULATORY_CARE_PROVIDER_SITE_OTHER): Payer: 59 | Admitting: Family Medicine

## 2014-05-17 VITALS — BP 120/82 | HR 78 | Temp 98.6°F | Resp 16 | Ht 67.0 in | Wt 171.0 lb

## 2014-05-17 DIAGNOSIS — R7303 Prediabetes: Secondary | ICD-10-CM

## 2014-05-17 DIAGNOSIS — R7309 Other abnormal glucose: Secondary | ICD-10-CM

## 2014-05-17 DIAGNOSIS — E559 Vitamin D deficiency, unspecified: Secondary | ICD-10-CM

## 2014-05-17 DIAGNOSIS — I10 Essential (primary) hypertension: Secondary | ICD-10-CM

## 2014-05-17 DIAGNOSIS — E876 Hypokalemia: Secondary | ICD-10-CM

## 2014-05-17 DIAGNOSIS — J029 Acute pharyngitis, unspecified: Secondary | ICD-10-CM | POA: Insufficient documentation

## 2014-05-17 LAB — POCT RAPID STREP A (OFFICE): RAPID STREP A SCREEN: NEGATIVE

## 2014-05-17 MED ORDER — AZITHROMYCIN 250 MG PO TABS: ORAL_TABLET | ORAL | Status: DC

## 2014-05-17 MED ORDER — FLUCONAZOLE 150 MG PO TABS: ORAL_TABLET | ORAL | Status: AC

## 2014-05-17 NOTE — Patient Instructions (Addendum)
F/u in 4 month, call if you need me before   You are being trated for s acute pharngitis, z pack is prescribed   Work excuse to return on 05/18/2014  CBC, vit D chem & , HBA1C today
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
## 2014-05-18 ENCOUNTER — Encounter: Payer: Self-pay | Admitting: Family Medicine

## 2014-05-18 LAB — BASIC METABOLIC PANEL
BUN: 7 mg/dL (ref 6–23)
CALCIUM: 9.4 mg/dL (ref 8.4–10.5)
CO2: 29 meq/L (ref 19–32)
Chloride: 101 mEq/L (ref 96–112)
Creat: 0.86 mg/dL (ref 0.50–1.10)
Glucose, Bld: 72 mg/dL (ref 70–99)
Potassium: 3.1 mEq/L — ABNORMAL LOW (ref 3.5–5.3)
Sodium: 141 mEq/L (ref 135–145)

## 2014-05-18 LAB — CBC
HCT: 39.9 % (ref 36.0–46.0)
Hemoglobin: 12.8 g/dL (ref 12.0–15.0)
MCH: 22.1 pg — AB (ref 26.0–34.0)
MCHC: 32.1 g/dL (ref 30.0–36.0)
MCV: 69 fL — ABNORMAL LOW (ref 78.0–100.0)
PLATELETS: 299 10*3/uL (ref 150–400)
RBC: 5.78 MIL/uL — ABNORMAL HIGH (ref 3.87–5.11)
RDW: 16.3 % — ABNORMAL HIGH (ref 11.5–15.5)
WBC: 9 10*3/uL (ref 4.0–10.5)

## 2014-05-18 LAB — VITAMIN D 25 HYDROXY (VIT D DEFICIENCY, FRACTURES): VIT D 25 HYDROXY: 14 ng/mL — AB (ref 30–89)

## 2014-05-18 LAB — HEMOGLOBIN A1C
Hgb A1c MFr Bld: 5.9 % — ABNORMAL HIGH (ref <5.7)
Mean Plasma Glucose: 123 mg/dL — ABNORMAL HIGH (ref <117)

## 2014-05-19 MED ORDER — VITAMIN D (ERGOCALCIFEROL) 1.25 MG (50000 UNIT) PO CAPS: 50000.0000 [IU] | ORAL_CAPSULE | ORAL | Status: DC

## 2014-05-20 DIAGNOSIS — J029 Acute pharyngitis, unspecified: Secondary | ICD-10-CM | POA: Insufficient documentation

## 2014-05-20 NOTE — Assessment & Plan Note (Signed)
Rapid strep negative, symptomatic, so will treat with azithromycin, also work excuse

## 2014-05-20 NOTE — Assessment & Plan Note (Signed)
Controlled, no change in medication DASH diet and commitment to daily physical activity for a minimum of 30 minutes discussed and encouraged, as a part of hypertension management. The importance of attaining a healthy weight is also discussed.  

## 2014-05-20 NOTE — Assessment & Plan Note (Signed)
Deteriorated Patient educated about the importance of limiting  Carbohydrate intake , the need to commit to daily physical activity for a minimum of 30 minutes , and to commit weight loss. The fact that changes in all these areas will reduce or eliminate all together the development of diabetes is stressed.   \Updated lab needed at/ before next visit.  

## 2014-05-20 NOTE — Assessment & Plan Note (Signed)
Recurrent problem , may need to consider addition of spironolactone

## 2014-05-20 NOTE — Assessment & Plan Note (Signed)
Weekly vit D prescribed, uncertain of compliance, updated lab needed

## 2014-06-05 ENCOUNTER — Ambulatory Visit (INDEPENDENT_AMBULATORY_CARE_PROVIDER_SITE_OTHER): Payer: 59 | Admitting: Adult Health

## 2014-06-05 DIAGNOSIS — Z3042 Encounter for surveillance of injectable contraceptive: Secondary | ICD-10-CM

## 2014-06-05 DIAGNOSIS — Z3200 Encounter for pregnancy test, result unknown: Secondary | ICD-10-CM

## 2014-06-05 LAB — POCT URINE PREGNANCY: Preg Test, Ur: NEGATIVE

## 2014-06-05 MED ORDER — MEDROXYPROGESTERONE ACETATE 150 MG/ML IM SUSP
150.0000 mg | Freq: Once | INTRAMUSCULAR | Status: AC
  Administered 2014-06-05: 150 mg via INTRAMUSCULAR

## 2014-06-05 NOTE — Progress Notes (Signed)
Patient ID: [name], female   DOB: 1971-02-20, 43 y.o.   MRN: [id number] Pt states that she has had a local reaction to the injection the last few times that she has taken the injection. I spoke with JAG and Dr. Elonda Husky and they both advised benadryl if pt decides to take the injection. Pt states that she wants to take the shot and will take some benadryl when she gets home per Dr. Elonda Husky.

## 2014-06-21 ENCOUNTER — Ambulatory Visit: Payer: Managed Care, Other (non HMO) | Admitting: Family Medicine

## 2014-07-05 ENCOUNTER — Other Ambulatory Visit: Payer: Self-pay | Admitting: Family Medicine

## 2014-07-28 ENCOUNTER — Other Ambulatory Visit: Payer: Self-pay | Admitting: Family Medicine

## 2014-08-06 ENCOUNTER — Other Ambulatory Visit: Payer: Self-pay | Admitting: Family Medicine

## 2014-08-08 ENCOUNTER — Telehealth: Payer: Self-pay | Admitting: *Deleted

## 2014-08-08 NOTE — Telephone Encounter (Signed)
Pt called wanting to be seen today for a sinus infection after 12:00 and I made pt aware that we do not have anything after 12:00. Pt wanted to ask the nurse if Dr. Moshe Cipro would just call her in something since she is working today. Please advise

## 2014-08-08 NOTE — Telephone Encounter (Signed)
Spoke with patient and she has had sinus problems x 4 days.  She will be worked in for tomorrow at [employer]

## 2014-08-09 ENCOUNTER — Ambulatory Visit (INDEPENDENT_AMBULATORY_CARE_PROVIDER_SITE_OTHER): Payer: 59 | Admitting: Family Medicine

## 2014-08-09 ENCOUNTER — Encounter: Payer: Self-pay | Admitting: Family Medicine

## 2014-08-09 VITALS — BP 134/72 | HR 68 | Resp 18 | Ht 67.0 in | Wt 171.1 lb

## 2014-08-09 DIAGNOSIS — R7309 Other abnormal glucose: Secondary | ICD-10-CM

## 2014-08-09 DIAGNOSIS — E559 Vitamin D deficiency, unspecified: Secondary | ICD-10-CM

## 2014-08-09 DIAGNOSIS — I10 Essential (primary) hypertension: Secondary | ICD-10-CM

## 2014-08-09 DIAGNOSIS — Z79899 Other long term (current) drug therapy: Secondary | ICD-10-CM

## 2014-08-09 DIAGNOSIS — J0101 Acute recurrent maxillary sinusitis: Secondary | ICD-10-CM

## 2014-08-09 DIAGNOSIS — E663 Overweight: Secondary | ICD-10-CM

## 2014-08-09 DIAGNOSIS — J0100 Acute maxillary sinusitis, unspecified: Secondary | ICD-10-CM | POA: Insufficient documentation

## 2014-08-09 DIAGNOSIS — R7303 Prediabetes: Secondary | ICD-10-CM

## 2014-08-09 MED ORDER — FLUCONAZOLE 150 MG PO TABS: ORAL_TABLET | ORAL | Status: DC

## 2014-08-09 MED ORDER — LEVOFLOXACIN 500 MG PO TABS: 500.0000 mg | ORAL_TABLET | Freq: Every day | ORAL | Status: DC

## 2014-08-09 NOTE — Patient Instructions (Signed)
F/u in 4.5 month, cvall if you need me before  Medication is prescribed for sinusitis  Fasting labs in the next 2 weeks to include thyroid function test  All the best!

## 2014-08-12 NOTE — Progress Notes (Signed)
   Subjective:    Patient ID: [name], female    DOB: [birth year], 44 y.o.   MRN: [id number]  HPI 3 day h/o  Sinus pressure, chills and yellow nasal drainage, denies productive cough or chest congestion, denies sore throat, just scratch, generalized malaise. Inconsistent weight loss effort, but plans to work on this again Increased stress, lost her job yesterday, hopeful that she will find something soon   Review of Systems See HPI  Denies chest congestion, productive cough or wheezing. Denies chest pains, palpitations and leg swelling Denies abdominal pain, nausea, vomiting,diarrhea or constipation.   Denies dysuria, frequency, hesitancy or incontinence. Denies joint pain, swelling and limitation in mobility. Denies headaches, seizures, numbness, or tingling. Denies depression, anxiety or insomnia. Denies skin break down or rash.        Objective:   Physical Exam BP 134/72 mmHg  Pulse 68  Resp 18  Ht 5\' 7"  (1.702 m)  Wt 171 lb 1.9 oz (77.62 kg)  BMI 26.80 kg/m2  SpO2 99% Patient alert and oriented and in no cardiopulmonary distress.  HEENT: No facial asymmetry, EOMI,   oropharynx pink and moist.  Neck supple no JVD, no mass. Bilateral anterior cervical adenopathy, maxillary sinuses tender. TM clear Chest: Clear to auscultation bilaterally.  CVS: S1, S2 no murmurs, no S3.Regular rate.  ABD: Soft non tender.   Ext: No edema  MS: Adequate ROM spine, shoulders, hips and knees.  Skin: Intact, no ulcerations or rash noted.  Psych: Good eye contact, normal affect. Memory intact not anxious or depressed appearing.  CNS: CN 2-12 intact, power,  normal throughout.no focal deficits noted.        Assessment & Plan:  Maxillary sinusitis, acute Antibiotic prescrinbed   Vitamin D deficiency Updated lab needed at/ before next visit.    Overweight (BMI 25.0-29.9) Unchanged Patient re-educated about  the importance of commitment to a  minimum of 150 minutes of  exercise per week. The importance of healthy food choices with portion control discussed. Encouraged to start a food diary, count calories and to consider  joining a support group. Sample diet sheets offered. Goals set by the patient for the next several months.      Prediabetes Patient educated about the importance of limiting  Carbohydrate intake , the need to commit to daily physical activity for a minimum of 30 minutes , and to commit weight loss. The fact that changes in all these areas will reduce or eliminate all together the development of diabetes is stressed.   Updated lab needed at/ before next visit.    Essential hypertension Controlled, no change in medication DASH diet and commitment to daily physical activity for a minimum of 30 minutes discussed and encouraged, as a part of hypertension management. The importance of attaining a healthy weight is also discussed.

## 2014-08-12 NOTE — Assessment & Plan Note (Signed)
Updated lab needed at/ before next visit.   

## 2014-08-12 NOTE — Assessment & Plan Note (Signed)
Controlled, no change in medication DASH diet and commitment to daily physical activity for a minimum of 30 minutes discussed and encouraged, as a part of hypertension management. The importance of attaining a healthy weight is also discussed.  

## 2014-08-12 NOTE — Assessment & Plan Note (Signed)
Patient educated about the importance of limiting  Carbohydrate intake , the need to commit to daily physical activity for a minimum of 30 minutes , and to commit weight loss. The fact that changes in all these areas will reduce or eliminate all together the development of diabetes is stressed.   Updated lab needed at/ before next visit.  

## 2014-08-12 NOTE — Assessment & Plan Note (Signed)
Unchanged. Patient re-educated about  the importance of commitment to a  minimum of 150 minutes of exercise per week. The importance of healthy food choices with portion control discussed. Encouraged to start a food diary, count calories and to consider  joining a support group. Sample diet sheets offered. Goals set by the patient for the next several months.    

## 2014-08-12 NOTE — Assessment & Plan Note (Signed)
Antibiotic prescrinbed

## 2014-08-28 ENCOUNTER — Ambulatory Visit (INDEPENDENT_AMBULATORY_CARE_PROVIDER_SITE_OTHER): Payer: 59 | Admitting: *Deleted

## 2014-08-28 ENCOUNTER — Encounter: Payer: Self-pay | Admitting: *Deleted

## 2014-08-28 ENCOUNTER — Ambulatory Visit: Payer: 59

## 2014-08-28 DIAGNOSIS — Z3202 Encounter for pregnancy test, result negative: Secondary | ICD-10-CM

## 2014-08-28 DIAGNOSIS — Z3042 Encounter for surveillance of injectable contraceptive: Secondary | ICD-10-CM

## 2014-08-28 LAB — POCT URINE PREGNANCY: PREG TEST UR: NEGATIVE

## 2014-08-28 MED ORDER — MEDROXYPROGESTERONE ACETATE 150 MG/ML IM SUSP
150.0000 mg | Freq: Once | INTRAMUSCULAR | Status: AC
  Administered 2014-08-28: 150 mg via INTRAMUSCULAR

## 2014-08-28 NOTE — Progress Notes (Signed)
Pt here for Depo. Reports no problems at this time. Return in 12 weeks for next shot. JSY 

## 2014-08-31 ENCOUNTER — Ambulatory Visit (INDEPENDENT_AMBULATORY_CARE_PROVIDER_SITE_OTHER): Payer: 59

## 2014-08-31 DIAGNOSIS — Z23 Encounter for immunization: Secondary | ICD-10-CM

## 2014-09-27 ENCOUNTER — Other Ambulatory Visit: Payer: 59 | Admitting: Adult Health

## 2014-09-27 ENCOUNTER — Ambulatory Visit (INDEPENDENT_AMBULATORY_CARE_PROVIDER_SITE_OTHER): Payer: Medicaid Other | Admitting: Adult Health

## 2014-09-27 ENCOUNTER — Encounter: Payer: Self-pay | Admitting: Adult Health

## 2014-09-27 ENCOUNTER — Other Ambulatory Visit (HOSPITAL_COMMUNITY)
Admission: RE | Admit: 2014-09-27 | Discharge: 2014-09-27 | Disposition: A | Discharge status: Home / Self Care | Payer: Medicaid Other | Source: Ambulatory Visit | Attending: Adult Health | Admitting: Adult Health

## 2014-09-27 VITALS — BP 116/70 | HR 80 | Ht 67.0 in | Wt 171.0 lb

## 2014-09-27 DIAGNOSIS — Z113 Encounter for screening for infections with a predominantly sexual mode of transmission: Secondary | ICD-10-CM | POA: Diagnosis present

## 2014-09-27 DIAGNOSIS — Z01419 Encounter for gynecological examination (general) (routine) without abnormal findings: Secondary | ICD-10-CM | POA: Insufficient documentation

## 2014-09-27 DIAGNOSIS — Z0000 Encounter for general adult medical examination without abnormal findings: Secondary | ICD-10-CM

## 2014-09-27 DIAGNOSIS — Z1212 Encounter for screening for malignant neoplasm of rectum: Secondary | ICD-10-CM

## 2014-09-27 DIAGNOSIS — Z1151 Encounter for screening for human papillomavirus (HPV): Secondary | ICD-10-CM | POA: Insufficient documentation

## 2014-09-27 DIAGNOSIS — R350 Frequency of micturition: Secondary | ICD-10-CM | POA: Diagnosis not present

## 2014-09-27 LAB — HEMOCCULT GUIAC POC 1CARD (OFFICE): FECAL OCCULT BLD: NEGATIVE

## 2014-09-27 LAB — POCT URINALYSIS DIPSTICK

## 2014-09-27 MED ORDER — NITROFURANTOIN MONOHYD MACRO 100 MG PO CAPS: 100.0000 mg | ORAL_CAPSULE | Freq: Two times a day (BID) | ORAL | Status: DC

## 2014-09-27 MED ORDER — FLUCONAZOLE 150 MG PO TABS: 150.0000 mg | ORAL_TABLET | Freq: Once | ORAL | Status: DC

## 2014-09-27 NOTE — Patient Instructions (Signed)
Return in 4 weeks for pre op Physical in 1 year mammogram yearly

## 2014-09-27 NOTE — Progress Notes (Signed)
Patient ID: [name], female   DOB: 1971/01/01, 44 y.o.   MRN: [id number] History of Present Illness: Zhara is a 44 year old black female in for well woman gyn exam and pap and is having urinary frequency. She has lost job and is going back to school.  Current Medications, Allergies, Past Medical History, Past Surgical History, Family History and Social History were reviewed in [employer] record.     Review of Systems:  Patient denies any headaches, hearing loss, fatigue, blurred vision, shortness of breath, chest pain, abdominal pain, problems with bowel movements,  or intercourse. No joint pain or mood swings. +urinary frequency.  Physical Exam:BP 116/70 mmHg  Pulse 80  Ht 5\' 7"  (1.702 m)  Wt 171 lb (77.565 kg)  BMI 26.78 kg/m2Urine 2+ leuks, trace blood General:  Well developed, well nourished, no acute distress Skin:  Warm and dry Neck:  Midline trachea, enlarged thyroid, good ROM, no lymphadenopathy Lungs; Clear to auscultation bilaterally Breast:  No dominant palpable mass, retraction, or nipple discharge Cardiovascular: Regular rate and rhythm Abdomen:  Soft, non tender, no hepatosplenomegaly Pelvic:  External genitalia is normal in appearance, no lesions.  The vagina is normal in appearance, has good color, moisture and rugae. Urethra has no lesions or masses. The cervix is bulbous, pap with HPV and GC/CHL performed.  Uterus is felt to be normal size, shape, and contour.  No adnexal masses or tenderness noted.Bladder is non tender, no masses felt. Rectal: Good sphincter tone, no polyps, or hemorrhoids felt.  Hemoccult negative. Extremities/musculoskeletal:  No swelling or varicosities noted, no clubbing or cyanosis Psych:  No mood changes, alert and cooperative,seems happy She got depo this month, and she gets rash at injection site for about 3 days, and wants tubal.Tubal papers signed.  Impression:  Well woman gyn exam with pap Urinary  frequency   Plan: UA C&S sent Rx macrobid 1 bid x 7 days Rx diflucan 150 mg #1 take 1 now with 2 refills Return in 4 week for pre op with Dr Glo Herring  Physical in 1 year Mammogram yearly

## 2014-09-28 ENCOUNTER — Telehealth: Payer: Self-pay | Admitting: Adult Health

## 2014-09-28 LAB — URINALYSIS, ROUTINE W REFLEX MICROSCOPIC
Bilirubin, UA: NEGATIVE
Glucose, UA: NEGATIVE
KETONES UA: NEGATIVE
NITRITE UA: NEGATIVE
RBC UA: NEGATIVE
SPEC GRAV UA: 1.018 (ref 1.005–1.030)
UUROB: 0.2 mg/dL (ref 0.2–1.0)
pH, UA: 6 (ref 5.0–7.5)

## 2014-09-28 LAB — MICROSCOPIC EXAMINATION
Casts: NONE SEEN /lpf
RBC MICROSCOPIC, UA: NONE SEEN /HPF (ref 0–?)

## 2014-09-28 NOTE — Telephone Encounter (Signed)
Pt aware culture won't be back til monday

## 2014-09-29 LAB — URINE CULTURE

## 2014-09-29 LAB — CYTOLOGY - PAP

## 2014-10-15 ENCOUNTER — Other Ambulatory Visit: Payer: Self-pay | Admitting: Family Medicine

## 2014-10-26 ENCOUNTER — Encounter: Payer: Medicaid Other | Admitting: Obstetrics and Gynecology

## 2014-11-06 ENCOUNTER — Telehealth: Payer: Self-pay | Admitting: Adult Health

## 2014-11-06 NOTE — Telephone Encounter (Signed)
Let message I called,call me back in am

## 2014-11-07 ENCOUNTER — Telehealth: Payer: Self-pay | Admitting: *Deleted

## 2014-11-07 NOTE — Telephone Encounter (Signed)
Has some irritation feeling like UTI just took macrobid and culture was <10,000 push water and cranberry juice, make appt if any changes

## 2014-11-17 ENCOUNTER — Ambulatory Visit: Payer: Medicaid Other | Admitting: Adult Health

## 2014-11-17 ENCOUNTER — Encounter: Payer: Self-pay | Admitting: Adult Health

## 2014-11-20 ENCOUNTER — Ambulatory Visit: Payer: Medicaid Other

## 2014-12-04 ENCOUNTER — Other Ambulatory Visit: Payer: Self-pay | Admitting: Family Medicine

## 2014-12-14 ENCOUNTER — Encounter: Payer: Self-pay | Admitting: Adult Health

## 2014-12-14 ENCOUNTER — Ambulatory Visit (INDEPENDENT_AMBULATORY_CARE_PROVIDER_SITE_OTHER): Payer: Medicaid Other | Admitting: Adult Health

## 2014-12-14 VITALS — BP 118/78 | HR 52 | Ht 67.0 in | Wt 169.0 lb

## 2014-12-14 DIAGNOSIS — R319 Hematuria, unspecified: Secondary | ICD-10-CM

## 2014-12-14 DIAGNOSIS — Z3202 Encounter for pregnancy test, result negative: Secondary | ICD-10-CM | POA: Diagnosis not present

## 2014-12-14 DIAGNOSIS — R350 Frequency of micturition: Secondary | ICD-10-CM | POA: Insufficient documentation

## 2014-12-14 DIAGNOSIS — Z30011 Encounter for initial prescription of contraceptive pills: Secondary | ICD-10-CM | POA: Diagnosis not present

## 2014-12-14 HISTORY — DX: Frequency of micturition: R35.0

## 2014-12-14 LAB — POCT URINALYSIS DIPSTICK
Glucose, UA: NEGATIVE
Leukocytes, UA: NEGATIVE
Nitrite, UA: NEGATIVE

## 2014-12-14 LAB — POCT URINE PREGNANCY: Preg Test, Ur: NEGATIVE

## 2014-12-14 MED ORDER — SULFAMETHOXAZOLE-TRIMETHOPRIM 800-160 MG PO TABS: 1.0000 | ORAL_TABLET | Freq: Two times a day (BID) | ORAL | Status: DC

## 2014-12-14 MED ORDER — NORETHIN-ETH ESTRAD-FE BIPHAS 1 MG-10 MCG / 10 MCG PO TABS: 1.0000 | ORAL_TABLET | Freq: Every day | ORAL | Status: DC

## 2014-12-14 NOTE — Progress Notes (Signed)
Subjective:     Patient ID: [name], female   DOB: 28-Jul-1970, 44 y.o.   MRN: [id number]  HPI [name] is a 44 year old black female in complaining of urinary frequency and feels like not emptying bladder, and wants OCs, can't get tubal right now, has new job.  Review of Systems Patient denies any headaches, hearing loss, fatigue, blurred vision, shortness of breath, chest pain, abdominal pain, problems with bowel movements, or intercourse. No joint pain or mood swings.See HPI for positives.  Reviewed past medical,surgical, social and family history. Reviewed medications and allergies.     Objective:   Physical Exam BP 118/78 mmHg  Pulse 52  Ht 5\' 7"  (1.702 m)  Wt 169 lb (76.658 kg)  BMI 26.46 kg/m2  UPT negative, urine dipstick 1+ protein and trace blood, Skin warm and dry.Pelvic: external genitalia is normal in appearance no lesions, vagina: white discharge without odor,urethra has no lesions or masses noted, cervix:smooth and bulbous, uterus: normal size, shape and contour, non tender, no masses felt, adnexa: no masses or tenderness noted. Bladder is mildly tender and no masses felt.   No CVAT Assessment:     Urinary frequency Contraceptive management Hematuria     Plan:     UA C&S sent Rx lo loestrin disp 1 pack take 1 daily with 11 refills, start today   Rx septra ds take 1 bid x 7 days  Increase water Review handout on UTI

## 2014-12-14 NOTE — Patient Instructions (Signed)
Start the lo loestrin today use condoms Take septra ds   Increase water Urinary Tract Infection Urinary tract infections (UTIs) can develop anywhere along your urinary tract. Your urinary tract is your body's drainage system for removing wastes and extra water. Your urinary tract includes two kidneys, two ureters, a bladder, and a urethra. Your kidneys are a pair of bean-shaped organs. Each kidney is about the size of your fist. They are located below your ribs, one on each side of your spine. CAUSES Infections are caused by microbes, which are microscopic organisms, including fungi, viruses, and bacteria. These organisms are so small that they can only be seen through a microscope. Bacteria are the microbes that most commonly cause UTIs. SYMPTOMS  Symptoms of UTIs may vary by age and gender of the patient and by the location of the infection. Symptoms in young women typically include a frequent and intense urge to urinate and a painful, burning feeling in the bladder or urethra during urination. Older women and men are more likely to be tired, shaky, and weak and have muscle aches and abdominal pain. A fever may mean the infection is in your kidneys. Other symptoms of a kidney infection include pain in your back or sides below the ribs, nausea, and vomiting. DIAGNOSIS To diagnose a UTI, your caregiver will ask you about your symptoms. Your caregiver also will ask to provide a urine sample. The urine sample will be tested for bacteria and white blood cells. White blood cells are made by your body to help fight infection. TREATMENT  Typically, UTIs can be treated with medication. Because most UTIs are caused by a bacterial infection, they usually can be treated with the use of antibiotics. The choice of antibiotic and length of treatment depend on your symptoms and the type of bacteria causing your infection. HOME CARE INSTRUCTIONS  If you were prescribed antibiotics, take them exactly as your caregiver  instructs you. Finish the medication even if you feel better after you have only taken some of the medication.  Drink enough water and fluids to keep your urine clear or pale yellow.  Avoid caffeine, tea, and carbonated beverages. They tend to irritate your bladder.  Empty your bladder often. Avoid holding urine for long periods of time.  Empty your bladder before and after sexual intercourse.  After a bowel movement, women should cleanse from front to back. Use each tissue only once. SEEK MEDICAL CARE IF:   You have back pain.  You develop a fever.  Your symptoms do not begin to resolve within 3 days. SEEK IMMEDIATE MEDICAL CARE IF:   You have severe back pain or lower abdominal pain.  You develop chills.  You have nausea or vomiting.  You have continued burning or discomfort with urination. MAKE SURE YOU:   Understand these instructions.  Will watch your condition.  Will get help right away if you are not doing well or get worse. Document Released: 04/02/2005 Document Revised: 12/23/2011 Document Reviewed: 08/01/2011 [facility] Patient Information 2015 [place], [place]. This information is not intended to replace advice given to you by your health care provider. Make sure you discuss any questions you have with your health care provider.  septra ds increase water

## 2014-12-16 LAB — URINE CULTURE: ORGANISM ID, BACTERIA: NO GROWTH

## 2014-12-16 LAB — URINALYSIS, ROUTINE W REFLEX MICROSCOPIC

## 2014-12-18 ENCOUNTER — Other Ambulatory Visit: Payer: Self-pay | Admitting: Family Medicine

## 2014-12-27 ENCOUNTER — Telehealth: Payer: Self-pay | Admitting: Adult Health

## 2014-12-27 NOTE — Telephone Encounter (Signed)
Pt states she thinks the Lo loestrin is causing her to be nauseated and to have stomach cramps. Pt states she has not started the antibiotic yet. Please advise.

## 2014-12-28 NOTE — Telephone Encounter (Signed)
No need to take septra culture showed no growth, take OC at night after eating

## 2015-01-09 ENCOUNTER — Encounter: Payer: Self-pay | Admitting: *Deleted

## 2015-01-09 ENCOUNTER — Ambulatory Visit: Payer: Medicaid Other | Admitting: Family Medicine

## 2015-01-19 ENCOUNTER — Other Ambulatory Visit: Payer: Self-pay | Admitting: Family Medicine

## 2015-02-06 ENCOUNTER — Other Ambulatory Visit: Payer: Self-pay | Admitting: Family Medicine

## 2015-02-08 ENCOUNTER — Encounter: Payer: Self-pay | Admitting: Family Medicine

## 2015-02-08 ENCOUNTER — Ambulatory Visit (INDEPENDENT_AMBULATORY_CARE_PROVIDER_SITE_OTHER): Payer: Medicaid Other | Admitting: Family Medicine

## 2015-02-08 VITALS — BP 120/82 | HR 59 | Resp 16 | Ht 67.0 in | Wt 169.0 lb

## 2015-02-08 DIAGNOSIS — I10 Essential (primary) hypertension: Secondary | ICD-10-CM

## 2015-02-08 DIAGNOSIS — Z113 Encounter for screening for infections with a predominantly sexual mode of transmission: Secondary | ICD-10-CM

## 2015-02-08 DIAGNOSIS — T63441S Toxic effect of venom of bees, accidental (unintentional), sequela: Secondary | ICD-10-CM | POA: Diagnosis not present

## 2015-02-08 DIAGNOSIS — T63441A Toxic effect of venom of bees, accidental (unintentional), initial encounter: Secondary | ICD-10-CM | POA: Insufficient documentation

## 2015-02-08 DIAGNOSIS — R7309 Other abnormal glucose: Secondary | ICD-10-CM

## 2015-02-08 DIAGNOSIS — E876 Hypokalemia: Secondary | ICD-10-CM

## 2015-02-08 DIAGNOSIS — R7303 Prediabetes: Secondary | ICD-10-CM

## 2015-02-08 DIAGNOSIS — E663 Overweight: Secondary | ICD-10-CM

## 2015-02-08 DIAGNOSIS — E041 Nontoxic single thyroid nodule: Secondary | ICD-10-CM

## 2015-02-08 MED ORDER — PREDNISONE 5 MG PO TABS: 5.0000 mg | ORAL_TABLET | Freq: Two times a day (BID) | ORAL | Status: AC

## 2015-02-08 MED ORDER — DILTIAZEM HCL ER COATED BEADS 120 MG PO CP24: ORAL_CAPSULE | ORAL | Status: DC

## 2015-02-08 MED ORDER — METOPROLOL TARTRATE 25 MG PO TABS: ORAL_TABLET | ORAL | Status: DC

## 2015-02-08 MED ORDER — POTASSIUM CHLORIDE CRYS ER 20 MEQ PO TBCR: EXTENDED_RELEASE_TABLET | ORAL | Status: DC

## 2015-02-08 MED ORDER — HYDROCHLOROTHIAZIDE 25 MG PO TABS: 25.0000 mg | ORAL_TABLET | Freq: Every day | ORAL | Status: DC

## 2015-02-08 NOTE — Progress Notes (Signed)
Subjective:    Patient ID: [name], female    DOB: 06-08-1971, 44 y.o.   MRN: [id number]  HPI 2 day h/o pain and swelling of right mid finger following bee sting, limited mobility, no drainage, unable to work as she uses fingers on the job. No fever or chills  Review of Systems See HPI Denies recent fever or chills. Denies sinus pressure, nasal congestion, ear pain or sore throat. Denies chest congestion, productive cough or wheezing. Denies chest pains, palpitations and leg swelling Denies abdominal pain, nausea, vomiting,diarrhea or constipation.   Denies dysuria, frequency, hesitancy or incontinence. . Denies headaches, seizures, numbness, or tingling. Denies depression, anxiety or insomnia.         Objective:   Physical Exam BP 120/82 mmHg  Pulse 59  Resp 16  Ht 5\' 7"  (1.702 m)  Wt 169 lb (76.658 kg)  BMI 26.46 kg/m2  SpO2 100% Patient alert and oriented and in no cardiopulmonary distress.  HEENT: No facial asymmetry, EOMI,   oropharynx pink and moist.  Neck supple no JVD, no mass.  Chest: Clear to auscultation bilaterally.  CVS: S1, S2 no murmurs, no S3.Regular rate.  ABD: Soft non tender.   Ext: No edema  MS: Adequate ROM spine, shoulders, hips and knees. Third right finger swollen, warm and red, no puncture bite visible, no purulent drainage or collection Skin: Intact, no ulcerations or rash noted.  Psych: Good eye contact, normal affect. Memory intact not anxious or depressed appearing.  CNS: CN 2-12 intact, power,  normal throughout.no focal deficits noted.       Assessment & Plan:  Essential hypertension Controlled, no change in medication DASH diet and commitment to daily physical activity for a minimum of 30 minutes discussed and encouraged, as a part of hypertension management. The importance of attaining a healthy weight is also discussed.  BP/Weight 02/08/2015 12/14/2014 09/27/2014 08/09/2014 05/17/2014 12/08/2013 2/84/1324  Systolic BP 401  027 253 664 403 474 259  Diastolic BP 82 78 70 72 82 76 82  Wt. (Lbs) 169 169 171 171.12 171 170 174  BMI 26.46 26.46 26.78 26.8 26.78 26.62 28.1        Bee sting reaction Local swelling and redness, reduced mobility at IP joint. Cool compress, and 5 day course of oral prednisone. No evidence of bacterial super infection Work excuse x 1 day  Prediabetes Patient educated about the importance of limiting  Carbohydrate intake , the need to commit to daily physical activity for a minimum of 30 minutes , and to commit weight loss. The fact that changes in all these areas will reduce or eliminate all together the development of diabetes is stressed.  Updated lab needed at/ before next visit.   Diabetic Labs Latest Ref Rng 05/17/2014 07/23/2013 07/27/2012 04/07/2012 08/25/2011  HbA1c <5.7 % 5.9(H) 5.6 5.9(H) - 5.9(H)  Chol 0 - 200 mg/dL - 130 - - 149  HDL >39 mg/dL - 38(L) - - 42  Calc LDL 0 - 99 mg/dL - 79 - - 94  Triglycerides <150 mg/dL - 67 - - 67  Creatinine 0.50 - 1.10 mg/dL 0.86 0.85 0.89 0.91 0.99   BP/Weight 02/08/2015 12/14/2014 09/27/2014 08/09/2014 05/17/2014 12/08/2013 5/63/8756  Systolic BP 433 295 188 416 606 301 601  Diastolic BP 82 78 70 72 82 76 82  Wt. (Lbs) 169 169 171 171.12 171 170 174  BMI 26.46 26.46 26.78 26.8 26.78 26.62 28.1   No flowsheet data found.  HYPOKALEMIA Medical non compliance the main reason, pt does require very high doses of supplemental potassium,  has had endo eval  Overweight (BMI 25.0-29.9) Unchanged. Patient re-educated about  the importance of commitment to a  minimum of 150 minutes of exercise per week.  The importance of healthy food choices with portion control discussed. Encouraged to start a food diary, count calories and to consider  joining a support group. Sample diet sheets offered. Goals set by the patient for the next several months.   Weight /BMI 02/08/2015 12/14/2014 09/27/2014  WEIGHT 169 lb 169 lb 171 lb  HEIGHT 5\' 7"  5\' 7"   5\' 7"   BMI 26.46 kg/m2 26.46 kg/m2 26.78 kg/m2    Current exercise per week 120 minutes.

## 2015-02-08 NOTE — Assessment & Plan Note (Signed)
Controlled, no change in medication DASH diet and commitment to daily physical activity for a minimum of 30 minutes discussed and encouraged, as a part of hypertension management. The importance of attaining a healthy weight is also discussed.  BP/Weight 02/08/2015 12/14/2014 09/27/2014 08/09/2014 05/17/2014 12/08/2013 5/67/0141  Systolic BP 030 131 438 887 579 728 206  Diastolic BP 82 78 70 72 82 76 82  Wt. (Lbs) 169 169 171 171.12 171 170 174  BMI 26.46 26.46 26.78 26.8 26.78 26.62 28.1

## 2015-02-08 NOTE — Patient Instructions (Addendum)
F/u in early January, call if you need me before  You have an allergic reaction to bee sting, local to the affected finger  Apply cool compress, and take 5 day course of  prednisone  Work excuse for today return  Tomorrow  Fasting labs first week in September  It is important that you exercise regularly at least 30 minutes 5 times a week. If you develop chest pain, have severe difficulty breathing, or feel very tired, stop exercising immediately and seek medical attention    Thanks for choosing [facility], we consider it a privelige to serve you.

## 2015-03-10 NOTE — Assessment & Plan Note (Signed)
Medical non compliance the main reason, pt does require very high doses of supplemental potassium,  has had endo eval

## 2015-03-10 NOTE — Assessment & Plan Note (Signed)
Unchanged. Patient re-educated about  the importance of commitment to a  minimum of 150 minutes of exercise per week.  The importance of healthy food choices with portion control discussed. Encouraged to start a food diary, count calories and to consider  joining a support group. Sample diet sheets offered. Goals set by the patient for the next several months.   Weight /BMI 02/08/2015 12/14/2014 09/27/2014  WEIGHT 169 lb 169 lb 171 lb  HEIGHT 5\' 7"  5\' 7"  5\' 7"   BMI 26.46 kg/m2 26.46 kg/m2 26.78 kg/m2    Current exercise per week 120 minutes.

## 2015-03-10 NOTE — Assessment & Plan Note (Signed)
Patient educated about the importance of limiting  Carbohydrate intake , the need to commit to daily physical activity for a minimum of 30 minutes , and to commit weight loss. The fact that changes in all these areas will reduce or eliminate all together the development of diabetes is stressed.  Updated lab needed at/ before next visit.   Diabetic Labs Latest Ref Rng 05/17/2014 07/23/2013 07/27/2012 04/07/2012 08/25/2011  HbA1c <5.7 % 5.9(H) 5.6 5.9(H) - 5.9(H)  Chol 0 - 200 mg/dL - 130 - - 149  HDL >39 mg/dL - 38(L) - - 42  Calc LDL 0 - 99 mg/dL - 79 - - 94  Triglycerides <150 mg/dL - 67 - - 67  Creatinine 0.50 - 1.10 mg/dL 0.86 0.85 0.89 0.91 0.99   BP/Weight 02/08/2015 12/14/2014 09/27/2014 08/09/2014 05/17/2014 12/08/2013 02/22/4036  Systolic BP 543 606 770 340 352 481 859  Diastolic BP 82 78 70 72 82 76 82  Wt. (Lbs) 169 169 171 171.12 171 170 174  BMI 26.46 26.46 26.78 26.8 26.78 26.62 28.1   No flowsheet data found.

## 2015-03-10 NOTE — Assessment & Plan Note (Signed)
Local swelling and redness, reduced mobility at IP joint. Cool compress, and 5 day course of oral prednisone. No evidence of bacterial super infection Work excuse x 1 day

## 2015-03-10 NOTE — Assessment & Plan Note (Signed)
Followed by endo, needs to make f/u appt

## 2015-03-23 ENCOUNTER — Other Ambulatory Visit: Payer: Self-pay | Admitting: Family Medicine

## 2015-03-23 DIAGNOSIS — Z1231 Encounter for screening mammogram for malignant neoplasm of breast: Secondary | ICD-10-CM

## 2015-03-26 ENCOUNTER — Ambulatory Visit (HOSPITAL_COMMUNITY)
Admission: RE | Admit: 2015-03-26 | Discharge: 2015-03-26 | Disposition: A | Discharge status: Home / Self Care | Payer: Medicaid Other | Source: Ambulatory Visit | Attending: Family Medicine | Admitting: Family Medicine

## 2015-03-26 DIAGNOSIS — Z1231 Encounter for screening mammogram for malignant neoplasm of breast: Secondary | ICD-10-CM | POA: Insufficient documentation

## 2015-03-30 ENCOUNTER — Telehealth: Payer: Self-pay

## 2015-03-30 MED ORDER — DIPHENOXYLATE-ATROPINE 2.5-0.025 MG PO TABS: 1.0000 | ORAL_TABLET | Freq: Four times a day (QID) | ORAL | Status: DC | PRN

## 2015-03-30 MED ORDER — ONDANSETRON HCL 4 MG PO TABS: 4.0000 mg | ORAL_TABLET | Freq: Every day | ORAL | Status: DC | PRN

## 2015-03-30 NOTE — Telephone Encounter (Signed)
Vomiting No.    Recommended treatment Hydration is important Fluids small frequent amounts as tolerated Good hygiene reduces transmission among family members Review Brat diet  Zofran 4 mg 1 tablet daily as needed for nausea and vomiting no more than 6 tablets   DiarrheaYes.    Recommended treatment  Imodium OTC  Can also offer Lomotil 1 tablet 4 times daily as needed no more than 10 tablets Good hygiene reduces transmission among family members Review Brat Diet  If patient starts to feel light headed or not passing much urine or becoming dehydrated will need to go to emergency room for IV hydration  Please call office if symptoms worsen or do not improve after 2-3 days

## 2015-03-30 NOTE — Telephone Encounter (Signed)
OK excuse today return on 3/25, if still too sick to return in 48 hrs (if she works on a Sunday, needs to go to Urgent care on that day, 3/25

## 2015-03-30 NOTE — Telephone Encounter (Signed)
Patient aware.  Work note composed and faxed to [employer] attention [employer] per patient's request.

## 2015-04-01 ENCOUNTER — Other Ambulatory Visit: Payer: Self-pay | Admitting: Family Medicine

## 2015-04-27 ENCOUNTER — Ambulatory Visit (INDEPENDENT_AMBULATORY_CARE_PROVIDER_SITE_OTHER): Payer: Medicaid Other | Admitting: Adult Health

## 2015-04-27 ENCOUNTER — Encounter: Payer: Self-pay | Admitting: Adult Health

## 2015-04-27 VITALS — BP 120/80 | HR 52 | Ht 66.0 in | Wt 169.0 lb

## 2015-04-27 DIAGNOSIS — Z3202 Encounter for pregnancy test, result negative: Secondary | ICD-10-CM | POA: Diagnosis not present

## 2015-04-27 DIAGNOSIS — N921 Excessive and frequent menstruation with irregular cycle: Secondary | ICD-10-CM

## 2015-04-27 HISTORY — DX: Excessive and frequent menstruation with irregular cycle: N92.1

## 2015-04-27 LAB — POCT URINE PREGNANCY: PREG TEST UR: NEGATIVE

## 2015-04-27 NOTE — Progress Notes (Signed)
Subjective:     Patient ID: [name], female   DOB: [birth year], 44 y.o.   MRN: [id number]  HPI [name] is a 44 year old black female in complaining of not really having a period with lo loestrin but spots on and off for last week, no pain.  Review of Systems Patient denies any headaches, hearing loss, fatigue, blurred vision, shortness of breath, chest pain, abdominal pain, problems with bowel movements, urination, or intercourse. No joint pain or mood swings.See HPI for positives.  Reviewed past medical,surgical, social and family history. Reviewed medications and allergies.     Objective:   Physical Exam BP 120/80 mmHg  Pulse 52  Ht 5\' 6"  (1.676 m)  Wt 169 lb (76.658 kg)  BMI 27.29 kg/m2   UPT negative, Skin warm and dry.Pelvic: external genitalia is normal in appearance no lesions, vagina: white discharge without odor,urethra has no lesions or masses noted, cervix:smooth and bulbous, uterus: normal size, shape and contour, mildly tender, no masses felt, adnexa: no masses,LLQ tenderness noted. Bladder is non tender and no masses felt.Discussed could have had break through ovulation,if continues will get [place].  Assessment:     Irregular intermenstrual bleeding    Plan:     Take OCs at same time daily Call if happens again will get [place] Follow up prn

## 2015-04-27 NOTE — Patient Instructions (Signed)
Keep taking OCs  Call if happens again,for [place] Follow up prn

## 2015-05-22 ENCOUNTER — Other Ambulatory Visit: Payer: Self-pay | Admitting: Adult Health

## 2015-05-22 DIAGNOSIS — R1032 Left lower quadrant pain: Secondary | ICD-10-CM

## 2015-05-25 ENCOUNTER — Ambulatory Visit (INDEPENDENT_AMBULATORY_CARE_PROVIDER_SITE_OTHER): Payer: Medicaid Other

## 2015-05-25 DIAGNOSIS — R1032 Left lower quadrant pain: Secondary | ICD-10-CM

## 2015-05-25 NOTE — Progress Notes (Signed)
PELVIC TA/TV US: heterogenous anteverted uterus,EEC 5 mm,normal ov's bilat (mobile),small amount simple of cul de sac fluid,no pain during ultrasound

## 2015-05-28 ENCOUNTER — Telehealth: Payer: Self-pay | Admitting: Adult Health

## 2015-05-28 NOTE — Telephone Encounter (Signed)
Left message US was normal 

## 2015-06-06 ENCOUNTER — Telehealth: Payer: Self-pay | Admitting: Family Medicine

## 2015-06-06 MED ORDER — AZITHROMYCIN 250 MG PO TABS: ORAL_TABLET | ORAL | Status: DC

## 2015-06-06 NOTE — Telephone Encounter (Signed)
Patient states that she has sore throat, cough, sinus pressure, and chills x 24 hours.   Please advise.   Is asking for any rx to be sent to [facility].

## 2015-06-06 NOTE — Telephone Encounter (Signed)
Patient aware and medication sent to pharmacy  

## 2015-06-06 NOTE — Telephone Encounter (Signed)
Pls send z pack x 1 and let her know if not better and needs to be out of work will need to go tyo urgent care, unable to see her before next week

## 2015-06-06 NOTE — Telephone Encounter (Signed)
Patient is complaining of sore throat, sinus congestion asking to be seen due to being out of work today, needing a note

## 2015-06-06 NOTE — Addendum Note (Signed)
Addended by: Denman George B on: 06/06/2015 04:24 PM   Modules accepted: Orders

## 2015-06-25 ENCOUNTER — Telehealth: Payer: Self-pay | Admitting: *Deleted

## 2015-06-25 NOTE — Telephone Encounter (Signed)
Left message x 2. JSY 

## 2015-06-25 NOTE — Telephone Encounter (Signed)
Spoke with pt. Pt wants to start Depo again. Has been off of Depo x 5 months. I advised she would need to be seen. Pt voiced understanding. Call transferred to front desk for appt. Harper

## 2015-06-25 NOTE — Telephone Encounter (Signed)
Left message x 1. JSY 

## 2015-06-28 ENCOUNTER — Ambulatory Visit (INDEPENDENT_AMBULATORY_CARE_PROVIDER_SITE_OTHER): Payer: Medicaid Other

## 2015-06-28 ENCOUNTER — Encounter: Payer: Self-pay | Admitting: Obstetrics & Gynecology

## 2015-06-28 ENCOUNTER — Ambulatory Visit (INDEPENDENT_AMBULATORY_CARE_PROVIDER_SITE_OTHER): Payer: Medicaid Other | Admitting: Obstetrics & Gynecology

## 2015-06-28 VITALS — BP 100/60 | Ht 66.5 in | Wt 167.0 lb

## 2015-06-28 DIAGNOSIS — Z3202 Encounter for pregnancy test, result negative: Secondary | ICD-10-CM

## 2015-06-28 DIAGNOSIS — Z3009 Encounter for other general counseling and advice on contraception: Secondary | ICD-10-CM

## 2015-06-28 DIAGNOSIS — Z3042 Encounter for surveillance of injectable contraceptive: Secondary | ICD-10-CM

## 2015-06-28 LAB — POCT URINE PREGNANCY: Preg Test, Ur: NEGATIVE

## 2015-06-28 MED ORDER — MEDROXYPROGESTERONE ACETATE 150 MG/ML IM SUSP
150.0000 mg | Freq: Once | INTRAMUSCULAR | Status: AC
  Administered 2015-06-28: 150 mg via INTRAMUSCULAR

## 2015-06-28 MED ORDER — MEDROXYPROGESTERONE ACETATE 150 MG/ML IM SUSP: 150.0000 mg | INTRAMUSCULAR | Status: DC

## 2015-06-28 NOTE — Progress Notes (Signed)
Patient ID: [name], female   DOB: 1971-03-17, 44 y.o.   MRN: [id number]      Chief Complaint  Patient presents with  . discuss restarting the Depo Provera vs OCP    Blood pressure 100/60, height 5' 6.5" (1.689 m), weight 167 lb (75.751 kg).  44 y.o. [id number] No LMP recorded. Patient is not currently having periods (Reason: Oral contraceptives). The current method of family planning is OCP (estrogen/progesterone).  Subjective On [name] wants to go back to depo provera  Objective   Pertinent ROS   Labs or studies     Impression Diagnoses this Encounter::   ICD-9-CM ICD-10-CM   1. Pregnancy test negative V72.41 Z32.02 POCT urine pregnancy  2. General counseling and advice for contraceptive management V25.09 Z30.09     Established relevant diagnosis(es):   Plan/Recommendations: Meds ordered this encounter  Medications  . medroxyPROGESTERone (DEPO-PROVERA) 150 MG/ML injection    Sig: Inject 1 mL (150 mg total) into the muscle every 3 (three) months.    Dispense:  1 mL    Refill:  3    Labs or Scans Ordered: Orders Placed This Encounter  Procedures  . POCT urine pregnancy    Management::   Follow up 3  months       Face to face time:  10 minutes  Greater than 50% of the visit time was spent in counseling and coordination of care with the patient.  The summary and outline of the counseling and care coordination is summarized in the note above.   All questions were answered.

## 2015-07-12 ENCOUNTER — Other Ambulatory Visit: Payer: Self-pay | Admitting: Family Medicine

## 2015-07-24 ENCOUNTER — Telehealth: Payer: Self-pay | Admitting: *Deleted

## 2015-07-24 MED ORDER — METRONIDAZOLE 500 MG PO TABS: 500.0000 mg | ORAL_TABLET | Freq: Two times a day (BID) | ORAL | Status: DC

## 2015-07-24 NOTE — Telephone Encounter (Signed)
She complains of having BV  Wants flagyl called in, will do

## 2015-07-25 ENCOUNTER — Ambulatory Visit: Payer: Medicaid Other | Admitting: Adult Health

## 2015-08-12 ENCOUNTER — Other Ambulatory Visit: Payer: Self-pay | Admitting: Adult Health

## 2015-08-12 ENCOUNTER — Other Ambulatory Visit: Payer: Self-pay | Admitting: Family Medicine

## 2015-09-18 ENCOUNTER — Other Ambulatory Visit: Payer: Self-pay

## 2015-09-18 MED ORDER — DILTIAZEM HCL ER COATED BEADS 120 MG PO CP24: ORAL_CAPSULE | ORAL | Status: DC

## 2015-09-19 ENCOUNTER — Other Ambulatory Visit: Payer: Self-pay

## 2015-09-19 MED ORDER — CARDIZEM CD 120 MG PO CP24: 120.0000 mg | ORAL_CAPSULE | Freq: Every day | ORAL | Status: DC

## 2015-09-24 ENCOUNTER — Other Ambulatory Visit: Payer: Self-pay | Admitting: Family Medicine

## 2015-09-25 ENCOUNTER — Other Ambulatory Visit: Payer: Self-pay | Admitting: Adult Health

## 2015-09-26 ENCOUNTER — Encounter: Payer: Self-pay | Admitting: *Deleted

## 2015-09-26 ENCOUNTER — Ambulatory Visit (INDEPENDENT_AMBULATORY_CARE_PROVIDER_SITE_OTHER): Payer: Medicaid Other | Admitting: *Deleted

## 2015-09-26 ENCOUNTER — Ambulatory Visit: Payer: Medicaid Other

## 2015-09-26 DIAGNOSIS — Z3042 Encounter for surveillance of injectable contraceptive: Secondary | ICD-10-CM | POA: Diagnosis not present

## 2015-09-26 DIAGNOSIS — Z3202 Encounter for pregnancy test, result negative: Secondary | ICD-10-CM | POA: Diagnosis not present

## 2015-09-26 LAB — POCT URINE PREGNANCY: Preg Test, Ur: NEGATIVE

## 2015-09-26 MED ORDER — MEDROXYPROGESTERONE ACETATE 150 MG/ML IM SUSP
150.0000 mg | Freq: Once | INTRAMUSCULAR | Status: AC
  Administered 2015-09-26: 150 mg via INTRAMUSCULAR

## 2015-09-26 NOTE — Progress Notes (Signed)
Pt here for Depo. Pt tolerated shot well. Return in 12 weeks for next shot. JSY 

## 2015-10-15 ENCOUNTER — Other Ambulatory Visit: Payer: Self-pay | Admitting: Family Medicine

## 2015-10-28 ENCOUNTER — Other Ambulatory Visit: Payer: Self-pay | Admitting: Family Medicine

## 2015-11-11 ENCOUNTER — Other Ambulatory Visit: Payer: Self-pay | Admitting: Family Medicine

## 2015-11-28 ENCOUNTER — Encounter: Payer: Self-pay | Admitting: Family Medicine

## 2015-11-28 ENCOUNTER — Other Ambulatory Visit: Payer: Self-pay | Admitting: Family Medicine

## 2015-11-28 ENCOUNTER — Ambulatory Visit (INDEPENDENT_AMBULATORY_CARE_PROVIDER_SITE_OTHER): Payer: Medicaid Other | Admitting: Family Medicine

## 2015-11-28 VITALS — BP 122/82 | HR 76 | Resp 16 | Ht 67.0 in | Wt 167.0 lb

## 2015-11-28 DIAGNOSIS — R1013 Epigastric pain: Secondary | ICD-10-CM

## 2015-11-28 DIAGNOSIS — I10 Essential (primary) hypertension: Secondary | ICD-10-CM | POA: Diagnosis not present

## 2015-11-28 DIAGNOSIS — Z9109 Other allergy status, other than to drugs and biological substances: Secondary | ICD-10-CM

## 2015-11-28 DIAGNOSIS — Z889 Allergy status to unspecified drugs, medicaments and biological substances status: Secondary | ICD-10-CM | POA: Insufficient documentation

## 2015-11-28 DIAGNOSIS — E559 Vitamin D deficiency, unspecified: Secondary | ICD-10-CM

## 2015-11-28 DIAGNOSIS — N3000 Acute cystitis without hematuria: Secondary | ICD-10-CM | POA: Diagnosis not present

## 2015-11-28 DIAGNOSIS — E041 Nontoxic single thyroid nodule: Secondary | ICD-10-CM

## 2015-11-28 DIAGNOSIS — R7303 Prediabetes: Secondary | ICD-10-CM

## 2015-11-28 DIAGNOSIS — Z1159 Encounter for screening for other viral diseases: Secondary | ICD-10-CM

## 2015-11-28 DIAGNOSIS — E663 Overweight: Secondary | ICD-10-CM

## 2015-11-28 LAB — POCT URINALYSIS DIPSTICK
Bilirubin, UA: NEGATIVE
Glucose, UA: NEGATIVE
KETONES UA: NEGATIVE
Nitrite, UA: NEGATIVE
Protein, UA: 30
RBC UA: NEGATIVE
SPEC GRAV UA: 1.025
Urobilinogen, UA: 1
pH, UA: 7

## 2015-11-28 MED ORDER — HYDROCHLOROTHIAZIDE 25 MG PO TABS: ORAL_TABLET | ORAL | Status: DC

## 2015-11-28 MED ORDER — METOPROLOL TARTRATE 25 MG PO TABS: ORAL_TABLET | ORAL | Status: DC

## 2015-11-28 MED ORDER — HYDROXYZINE HCL 10 MG PO TABS: ORAL_TABLET | ORAL | Status: DC

## 2015-11-28 MED ORDER — MONTELUKAST SODIUM 10 MG PO TABS: 10.0000 mg | ORAL_TABLET | Freq: Every day | ORAL | Status: DC

## 2015-11-28 MED ORDER — OMEPRAZOLE 20 MG PO CPDR: 20.0000 mg | DELAYED_RELEASE_CAPSULE | Freq: Every day | ORAL | Status: DC

## 2015-11-28 MED ORDER — CARDIZEM CD 120 MG PO CP24: 120.0000 mg | ORAL_CAPSULE | Freq: Every day | ORAL | Status: DC

## 2015-11-28 NOTE — Patient Instructions (Signed)
F/u in 4.5 month, call if you need me sooner  Fasting labs this week please  New for allergies is singulair one daily. When allergies are controlled, headaches with lessen, hydroxyzine is prescribed, at night for allergies and sleep Use tylenol 500 mg one to two tablets for headache   Stomach pain is from reflux/ heartburn, reduce caffeing intake and omeprazole is prescribed also

## 2015-11-28 NOTE — Progress Notes (Signed)
Subjective:    Patient ID: [name], female    DOB: 27-Oct-1970, 45 y.o.   MRN: [id number]  HPI   [name]     MRN: [id number]      DOB: Feb 08, 1971   HPI [name] is here for follow up and re-evaluation of chronic medical conditions, medication management and review of any available recent lab and radiology data.  Preventive health is updated, specifically  Cancer screening and Immunization.   Questions or concerns regarding consultations or procedures which the PT has had in the interim are  addressed. The PT denies any adverse reactions to current medications since the last visit.  C/o central abdominal pain, and increased stress due to illness of son ROS Denies recent fever or chills. Denies sinus pressure, nasal congestion, ear pain or sore throat. Denies chest congestion, productive cough or wheezing. Denies chest pains, palpitations and leg swelling C/o centtral abdominal pain.   C/o dysuria and  frequency, denies hesitancy or incontinence. Denies joint pain, swelling and limitation in mobility. Denies headaches, seizures, numbness, or tingling. Denies depression, anxiety or insomnia. Denies skin break down or rash.   PE  BP 122/82 mmHg  Pulse 76  Resp 16  Ht 5\' 7"  (1.702 m)  Wt 167 lb (75.751 kg)  BMI 26.15 kg/m2  SpO2 100%  Patient alert and oriented and in no cardiopulmonary distress.  HEENT: No facial asymmetry, EOMI,   oropharynx pink and moist.  Neck supple no JVD, no mass. Thyromegaly with palpable nodules Chest: Clear to auscultation bilaterally.  CVS: S1, S2 no murmurs, no S3.Regular rate.  ABD: Soft mild epigastric tenderness, no guarding or rebound, no suprapubic tenderness or renal angle tenderness  Ext: No edema  MS: Adequate ROM spine, shoulders, hips and knees.  Skin: Intact, no ulcerations or rash noted.  Psych: Good eye contact, normal affect. Memory intact not anxious or depressed appearing.  CNS: CN 2-12 intact, power,  normal  throughout.no focal deficits noted.   Assessment & Plan   Essential hypertension Controlled, no change in medication DASH diet and commitment to daily physical activity for a minimum of 30 minutes discussed and encouraged, as a part of hypertension management. The importance of attaining a healthy weight is also discussed.  BP/Weight 11/28/2015 06/28/2015 04/27/2015 02/08/2015 12/14/2014 [id number]  Systolic BP 123XX123 123XX123 123456 123456 [id number]  Diastolic BP 82 60 80 82 78 70 72  Wt. (Lbs) 167 167 169 169 169 171 171.12  BMI 26.15 26.55 27.29 26.46 26.46 26.78 26.8        Acute cystitis without hematuria Mildly symptomatic with an UA will f/u c/s, no antibiotic prescribed  Dyspepsia Symptomatic, resume PPI and lifestyle modification  Overweight (BMI 25.0-29.9) Deteriorated. Patient re-educated about  the importance of commitment to a  minimum of 150 minutes of exercise per week.  The importance of healthy food choices with portion control discussed. Encouraged to start a food diary, count calories and to consider  joining a support group. Sample diet sheets offered. Goals set by the patient for the next several months.   Weight /BMI 11/28/2015 06/28/2015 04/27/2015  WEIGHT 167 lb 167 lb 169 lb  HEIGHT 5\' 7"  5' 6.5" 5\' 6"   BMI 26.15 kg/m2 26.55 kg/m2 27.29 kg/m2    Current exercise per week 90 minutes.   Prediabetes Patient educated about the importance of limiting  Carbohydrate intake , the need to commit to daily physical activity for a minimum of 30 minutes ,  and to commit weight loss. The fact that changes in all these areas will reduce or eliminate all together the development of diabetes is stressed.  Normalized which is good  Diabetic Labs Latest Ref Rng 05/17/2014 07/23/2013 07/27/2012 04/07/2012 08/25/2011  HbA1c <5.7 % 5.9(H) 5.6 5.9(H) - 5.9(H)  Chol 0 - 200 mg/dL - 130 - - 149  HDL >39 mg/dL - 38(L) - - 42  Calc LDL 0 - 99 mg/dL - 79 - - 94  Triglycerides  <150 mg/dL - 67 - - 67  Creatinine 0.50 - 1.10 mg/dL 0.86 0.85 0.89 0.91 0.99   BP/Weight 11/28/2015 06/28/2015 04/27/2015 02/08/2015 12/14/2014 [id number]  Systolic BP 123XX123 123XX123 123456 123456 123456 [id number]  Diastolic BP 82 60 80 82 78 70 72  Wt. (Lbs) 167 167 169 169 169 171 171.12  BMI 26.15 26.55 27.29 26.46 26.46 26.78 26.8   No flowsheet data found.      THYROID NODULE Needs follow up, and referrla back to endo      Review of Systems     Objective:   Physical Exam        Assessment & Plan:

## 2015-12-01 LAB — URINE CULTURE

## 2015-12-02 DIAGNOSIS — N3001 Acute cystitis with hematuria: Secondary | ICD-10-CM | POA: Insufficient documentation

## 2015-12-02 DIAGNOSIS — R1013 Epigastric pain: Secondary | ICD-10-CM | POA: Insufficient documentation

## 2015-12-02 NOTE — Assessment & Plan Note (Signed)
Patient educated about the importance of limiting  Carbohydrate intake , the need to commit to daily physical activity for a minimum of 30 minutes , and to commit weight loss. The fact that changes in all these areas will reduce or eliminate all together the development of diabetes is stressed.  Normalized which is good  Diabetic Labs Latest Ref Rng 05/17/2014 07/23/2013 07/27/2012 04/07/2012 08/25/2011  HbA1c <5.7 % 5.9(H) 5.6 5.9(H) - 5.9(H)  Chol 0 - 200 mg/dL - 130 - - 149  HDL >39 mg/dL - 38(L) - - 42  Calc LDL 0 - 99 mg/dL - 79 - - 94  Triglycerides <150 mg/dL - 67 - - 67  Creatinine 0.50 - 1.10 mg/dL 0.86 0.85 0.89 0.91 0.99   BP/Weight 11/28/2015 06/28/2015 04/27/2015 02/08/2015 12/14/2014 [id number]  Systolic BP 123XX123 123XX123 123456 123456 123456 [id number]  Diastolic BP 82 60 80 82 78 70 72  Wt. (Lbs) 167 167 169 169 169 171 171.12  BMI 26.15 26.55 27.29 26.46 26.46 26.78 26.8   No flowsheet data found.

## 2015-12-02 NOTE — Assessment & Plan Note (Signed)
Controlled, no change in medication DASH diet and commitment to daily physical activity for a minimum of 30 minutes discussed and encouraged, as a part of hypertension management. The importance of attaining a healthy weight is also discussed.  BP/Weight 11/28/2015 06/28/2015 04/27/2015 02/08/2015 12/14/2014 [id number]  Systolic BP 123XX123 123XX123 123456 123456 [id number]  Diastolic BP 82 60 80 82 78 70 72  Wt. (Lbs) 167 167 169 169 169 171 171.12  BMI 26.15 26.55 27.29 26.46 26.46 26.78 26.8

## 2015-12-02 NOTE — Assessment & Plan Note (Signed)
Deteriorated. Patient re-educated about  the importance of commitment to a  minimum of 150 minutes of exercise per week.  The importance of healthy food choices with portion control discussed. Encouraged to start a food diary, count calories and to consider  joining a support group. Sample diet sheets offered. Goals set by the patient for the next several months.   Weight /BMI 11/28/2015 06/28/2015 04/27/2015  WEIGHT 167 lb 167 lb 169 lb  HEIGHT 5\' 7"  5' 6.5" 5\' 6"   BMI 26.15 kg/m2 26.55 kg/m2 27.29 kg/m2    Current exercise per week 90 minutes.

## 2015-12-02 NOTE — Assessment & Plan Note (Signed)
Mildly symptomatic with an UA will f/u c/s, no antibiotic prescribed

## 2015-12-02 NOTE — Assessment & Plan Note (Signed)
Symptomatic, resume PPI and lifestyle modification

## 2015-12-02 NOTE — Assessment & Plan Note (Signed)
Needs follow up, and referrla back to endo

## 2015-12-11 ENCOUNTER — Ambulatory Visit (HOSPITAL_COMMUNITY): Payer: Medicaid Other

## 2015-12-14 ENCOUNTER — Ambulatory Visit (HOSPITAL_COMMUNITY): Admission: RE | Admit: 2015-12-14 | Payer: Medicaid Other | Source: Ambulatory Visit

## 2015-12-19 ENCOUNTER — Ambulatory Visit: Payer: Medicaid Other

## 2015-12-19 ENCOUNTER — Ambulatory Visit (INDEPENDENT_AMBULATORY_CARE_PROVIDER_SITE_OTHER): Payer: Medicaid Other | Admitting: Obstetrics and Gynecology

## 2015-12-19 ENCOUNTER — Encounter: Payer: Self-pay | Admitting: Obstetrics and Gynecology

## 2015-12-19 VITALS — BP 110/60 | Ht 66.0 in | Wt 165.0 lb

## 2015-12-19 DIAGNOSIS — Z308 Encounter for other contraceptive management: Secondary | ICD-10-CM

## 2015-12-19 DIAGNOSIS — Z01818 Encounter for other preprocedural examination: Secondary | ICD-10-CM | POA: Diagnosis not present

## 2015-12-19 DIAGNOSIS — Z3200 Encounter for pregnancy test, result unknown: Secondary | ICD-10-CM

## 2015-12-19 DIAGNOSIS — Z3202 Encounter for pregnancy test, result negative: Secondary | ICD-10-CM

## 2015-12-19 LAB — POCT URINE PREGNANCY: Preg Test, Ur: NEGATIVE

## 2015-12-19 NOTE — Progress Notes (Signed)
Patient ID: [name], female   DOB: 1970/09/26, 45 y.o.   MRN: [id number]  [facility] CLINIC PROCEDURE NOTE- IUD INSERT  [name] is a 45 y.o. [id number] here for (J7298)Mirena  IUD insertion which was not done  No GYN concerns. She notes DEPO shot causes itching and her last IUD caused 4 months of bleeding. Pt expresses interest in tubal ligation and endometrial ablation.  Last pap smear was on 09/27/2014 and was normal.   Bedside Transvaginal US Uterus: 4.1cm, 6.4 cm in length, anti-flexed with thin endometrium measuring 75mm  IUD Insertion Procedure Note Patient identified, informed consent performed, consent signed.   Discussed risks of irregular bleeding, cramping, infection, malpositioning or misplacement of the IUD outside the uterus which may require further procedure such as laparoscopy. Time out was performed.  Urine pregnancy test negative.  Speculum inserted, Cervix visualized.  Mirena IUD NOT placed per manufacturer's recommendations.   Pt wishes to pursue surgical management.   Discussed with pt risks and benefits of tubal ligation with endometrial ablation. At end of discussion, pt had opportunity to ask questions and has no further questions at this time.   Greater than 50% was spent in counseling and coordination of care with the patient. Total time greater than: 25 minutes  Tubal papers signed today. preop in 4 wk   By signing my name below, I, Rowan Blase, attest that this documentation has been prepared under the direction and in the presence of Jonnie Kind, MD . Electronically Signed: Rowan Blase, Scribe. 12/19/2015. 4:26 PM.  I personally performed the services described in this documentation, which was SCRIBED in my presence. The recorded information has been reviewed and considered accurate. It has been edited as necessary during review. Jonnie Kind, MD

## 2015-12-20 ENCOUNTER — Ambulatory Visit (INDEPENDENT_AMBULATORY_CARE_PROVIDER_SITE_OTHER): Payer: Medicaid Other | Admitting: *Deleted

## 2015-12-20 ENCOUNTER — Encounter: Payer: Self-pay | Admitting: *Deleted

## 2015-12-20 DIAGNOSIS — Z3042 Encounter for surveillance of injectable contraceptive: Secondary | ICD-10-CM | POA: Diagnosis not present

## 2015-12-20 DIAGNOSIS — Z3202 Encounter for pregnancy test, result negative: Secondary | ICD-10-CM | POA: Diagnosis not present

## 2015-12-20 LAB — POCT URINE PREGNANCY: Preg Test, Ur: NEGATIVE

## 2015-12-20 MED ORDER — MEDROXYPROGESTERONE ACETATE 150 MG/ML IM SUSP
150.0000 mg | Freq: Once | INTRAMUSCULAR | Status: AC
  Administered 2015-12-20: 150 mg via INTRAMUSCULAR

## 2015-12-20 NOTE — Progress Notes (Signed)
Pt here for Depo. Pt tolerated shot well. Return in 12 weeks for next shot. JSY 

## 2015-12-21 NOTE — Progress Notes (Signed)
Patient ID: [name], female   DOB: 08-Aug-1970, 46 y.o.   MRN: [id number] Patient ID: [name], female   DOB: Mar 02, 1971, 45 y.o.   MRN: [id number]  [facility] CLINIC PROCEDURE NOTE- IUD INSERT  [name] is a 45 y.o. [id number] here for (J7298)Mirena  IUD insertion which was not done  No GYN concerns. She notes DEPO shot causes itching and her last IUD caused 4 months of bleeding. Pt expresses interest in tubal ligation and endometrial ablation.  Last pap smear was on 09/27/2014 and was normal.   Bedside Transvaginal US Uterus: 4.1cm, 6.4 cm in length, anti-flexed with thin endometrium measuring 76mm  IUD Insertion Procedure Note Patient identified, informed consent performed, consent signed.   Discussed risks of irregular bleeding, cramping, infection, malpositioning or misplacement of the IUD outside the uterus which may require further procedure such as laparoscopy. Time out was performed.  Urine pregnancy test negative.  Speculum inserted, Cervix visualized.  Mirena IUD NOT placed per manufacturer's recommendations.   Pt wishes to pursue surgical management.   Discussed with pt risks and benefits of tubal ligation with endometrial ablation. At end of discussion, pt had opportunity to ask questions and has no further questions at this time.   Greater than 50% was spent in counseling and coordination of care with the patient. Total time greater than: 25 minutes  Tubal papers signed today. preop in 4 wk   By signing my name below, I, Rowan Blase, attest that this documentation has been prepared under the direction and in the presence of Jonnie Kind, MD . Electronically Signed: Rowan Blase, Scribe. 12/19/2015. 4:26 PM.  I personally performed the services described in this documentation, which was SCRIBED in my presence. The recorded information has been reviewed and considered accurate. It has been edited as necessary during review. Jonnie Kind, MD

## 2016-01-01 ENCOUNTER — Telehealth: Payer: Self-pay | Admitting: Family Medicine

## 2016-01-01 MED ORDER — EPINEPHRINE 0.3 MG/0.3ML IJ SOAJ: 0.3000 mg | Freq: Once | INTRAMUSCULAR | Status: DC

## 2016-01-01 NOTE — Telephone Encounter (Signed)
Patient aware.

## 2016-01-01 NOTE — Telephone Encounter (Signed)
Script sent pls let her know

## 2016-01-01 NOTE — Telephone Encounter (Signed)
Patient needs a EpiPen she is allergic to Pecans, actually had an reaction today after eating a salad that had pecans in it, please advise?

## 2016-01-03 ENCOUNTER — Other Ambulatory Visit: Payer: Self-pay | Admitting: Family Medicine

## 2016-02-08 ENCOUNTER — Other Ambulatory Visit: Payer: Self-pay | Admitting: Adult Health

## 2016-03-13 ENCOUNTER — Ambulatory Visit: Payer: Medicaid Other

## 2016-03-17 ENCOUNTER — Ambulatory Visit (INDEPENDENT_AMBULATORY_CARE_PROVIDER_SITE_OTHER): Payer: Medicaid Other | Admitting: *Deleted

## 2016-03-17 ENCOUNTER — Encounter: Payer: Self-pay | Admitting: *Deleted

## 2016-03-17 DIAGNOSIS — Z3042 Encounter for surveillance of injectable contraceptive: Secondary | ICD-10-CM | POA: Diagnosis not present

## 2016-03-17 DIAGNOSIS — Z3202 Encounter for pregnancy test, result negative: Secondary | ICD-10-CM | POA: Diagnosis not present

## 2016-03-17 LAB — POCT URINE PREGNANCY: PREG TEST UR: NEGATIVE

## 2016-03-17 MED ORDER — MEDROXYPROGESTERONE ACETATE 150 MG/ML IM SUSP
150.0000 mg | Freq: Once | INTRAMUSCULAR | Status: AC
  Administered 2016-03-17: 150 mg via INTRAMUSCULAR

## 2016-03-17 NOTE — Progress Notes (Signed)
Pt here for Depo. Pt tolerated shot well. Return in 12 weeks for next shot. JSY 

## 2016-03-27 ENCOUNTER — Other Ambulatory Visit: Payer: Medicaid Other | Admitting: Adult Health

## 2016-04-02 ENCOUNTER — Ambulatory Visit (INDEPENDENT_AMBULATORY_CARE_PROVIDER_SITE_OTHER): Payer: Medicaid Other | Admitting: Adult Health

## 2016-04-02 ENCOUNTER — Encounter: Payer: Self-pay | Admitting: Adult Health

## 2016-04-02 ENCOUNTER — Other Ambulatory Visit: Payer: Self-pay | Admitting: Family Medicine

## 2016-04-02 VITALS — BP 120/80 | HR 74 | Ht 66.0 in | Wt 169.0 lb

## 2016-04-02 DIAGNOSIS — Z0000 Encounter for general adult medical examination without abnormal findings: Secondary | ICD-10-CM

## 2016-04-02 DIAGNOSIS — Z1231 Encounter for screening mammogram for malignant neoplasm of breast: Secondary | ICD-10-CM

## 2016-04-02 DIAGNOSIS — R809 Proteinuria, unspecified: Secondary | ICD-10-CM | POA: Diagnosis not present

## 2016-04-02 DIAGNOSIS — Z1212 Encounter for screening for malignant neoplasm of rectum: Secondary | ICD-10-CM

## 2016-04-02 DIAGNOSIS — Z01419 Encounter for gynecological examination (general) (routine) without abnormal findings: Secondary | ICD-10-CM | POA: Diagnosis not present

## 2016-04-02 DIAGNOSIS — M5489 Other dorsalgia: Secondary | ICD-10-CM

## 2016-04-02 DIAGNOSIS — R1013 Epigastric pain: Secondary | ICD-10-CM | POA: Insufficient documentation

## 2016-04-02 LAB — POCT URINALYSIS DIPSTICK
GLUCOSE UA: NEGATIVE
KETONES UA: NEGATIVE
Leukocytes, UA: NEGATIVE
Nitrite, UA: NEGATIVE
RBC UA: NEGATIVE

## 2016-04-02 LAB — HEMOCCULT GUIAC POC 1CARD (OFFICE): Fecal Occult Blood, POC: NEGATIVE

## 2016-04-02 MED ORDER — FLUCONAZOLE 150 MG PO TABS: 150.0000 mg | ORAL_TABLET | Freq: Once | ORAL | 1 refills | Status: AC

## 2016-04-02 MED ORDER — SULFAMETHOXAZOLE-TRIMETHOPRIM 800-160 MG PO TABS: 1.0000 | ORAL_TABLET | Freq: Two times a day (BID) | ORAL | 0 refills | Status: DC

## 2016-04-02 NOTE — Progress Notes (Signed)
Patient ID: [name], female   DOB: [birth year], 45 y.o.   MRN: [id number] History of Present Illness: Ortencia is a 45 year old black female in for well woman gyn exam, she had a normal pap 09/27/14 with negative HPV.She is on Depo provera. PCP is Dr Moshe Cipro.   Current Medications, Allergies, Past Medical History, Past Surgical History, Family History and Social History were reviewed in [employer] record.     Review of Systems: Patient denies any headaches, hearing loss, fatigue, blurred vision, shortness of breath, chest pain, abdominal pain, problems with bowel movements, urination, or intercourse. No joint pain or mood swings. +back pain   Physical Exam:BP 120/80   Pulse 74   Ht 5\' 6"  (1.676 m)   Wt 169 lb (76.7 kg)   BMI 27.28 kg/m urine trace protein  General:  Well developed, well nourished, no acute distress Skin:  Warm and dry Neck:  Midline trachea, enlarged thyroid, good ROM, no lymphadenopathy Lungs; Clear to auscultation bilaterally Breast:  No dominant palpable mass, retraction, or nipple discharge Cardiovascular: Regular rate and rhythm Abdomen:  Soft, tender in epigastric area, no hepatosplenomegaly, mild CVAT Pelvic:  External genitalia is normal in appearance, no lesions.  The vagina is normal in appearance. Urethra has no lesions or masses. The cervix is bulbous.GC/CHL obtained.  Uterus is felt to be normal size, shape, and contour.  No adnexal masses or tenderness noted.Bladder is non tender, no masses felt. Rectal: Good sphincter tone, no polyps, or hemorrhoids felt.  Hemoccult negative. Extremities/musculoskeletal:  No swelling or varicosities noted, no clubbing or cyanosis Psych:  No mood changes, alert and cooperative,seems happy PHQ 2&9 score 0  Impression: 1. Well female exam with routine gynecological exam   2. Right-sided back pain, unspecified location   3. Epigastric pain   4. Protein in urine       Plan: GC/CHL sent UA C&S  sent Rx Septra ds 1 bid x 7 days #14 Rx diflucan 150 mg #1 take 1 now with 1 refill Push water Physical in 1 year pap in 2019 Mammogram yearly Labs with PCP Call for pre op when wants tubes tied, has signed papers

## 2016-04-02 NOTE — Patient Instructions (Addendum)
Physical in 1 year, pap in 2019 Mammogram yearly Push fluids  Labs with PCP

## 2016-04-03 LAB — URINALYSIS, ROUTINE W REFLEX MICROSCOPIC
BILIRUBIN UA: NEGATIVE
GLUCOSE, UA: NEGATIVE
Ketones, UA: NEGATIVE
LEUKOCYTES UA: NEGATIVE
Nitrite, UA: NEGATIVE
PH UA: 7 (ref 5.0–7.5)
PROTEIN UA: NEGATIVE
RBC, UA: NEGATIVE
Specific Gravity, UA: 1.024 (ref 1.005–1.030)
Urobilinogen, Ur: 0.2 mg/dL (ref 0.2–1.0)

## 2016-04-04 LAB — URINE CULTURE: Organism ID, Bacteria: NO GROWTH

## 2016-04-04 LAB — GC/CHLAMYDIA PROBE AMP
Chlamydia trachomatis, NAA: NEGATIVE
Neisseria gonorrhoeae by PCR: NEGATIVE

## 2016-04-07 ENCOUNTER — Ambulatory Visit (HOSPITAL_COMMUNITY): Payer: Medicaid Other

## 2016-04-08 ENCOUNTER — Telehealth: Payer: Self-pay | Admitting: Obstetrics and Gynecology

## 2016-04-08 NOTE — Telephone Encounter (Signed)
Pt informed of negative GC/CHL, urine culture from 04/02/2016.  Pt states she has discussed having tubal ligation with Dr. Glo Herring and has signed the tubal forms. Pt requesting surgery for 04/25/2016.

## 2016-04-09 ENCOUNTER — Other Ambulatory Visit: Payer: Self-pay | Admitting: Adult Health

## 2016-04-09 ENCOUNTER — Encounter: Payer: Self-pay | Admitting: Adult Health

## 2016-04-09 ENCOUNTER — Ambulatory Visit (INDEPENDENT_AMBULATORY_CARE_PROVIDER_SITE_OTHER): Payer: Medicaid Other | Admitting: Adult Health

## 2016-04-09 VITALS — BP 130/80 | HR 72 | Ht 66.0 in | Wt 169.0 lb

## 2016-04-09 DIAGNOSIS — Z3202 Encounter for pregnancy test, result negative: Secondary | ICD-10-CM | POA: Diagnosis not present

## 2016-04-09 DIAGNOSIS — N921 Excessive and frequent menstruation with irregular cycle: Secondary | ICD-10-CM | POA: Diagnosis not present

## 2016-04-09 LAB — POCT URINE PREGNANCY: Preg Test, Ur: NEGATIVE

## 2016-04-09 NOTE — Patient Instructions (Signed)

## 2016-04-09 NOTE — Telephone Encounter (Signed)
Pt c/o heavy vaginal bleeding with clots starting last night. Pt states she is taking the depo provera. Pt given an appt with Derrek Monaco, NP today at 3:45 pm.

## 2016-04-09 NOTE — Progress Notes (Signed)
Subjective:     Patient ID: [name], female   DOB: [birth year], 45 y.o.   MRN: [id number]  HPI [name] is a 45 year old black female, worked in for vaginal bleeding heavy on and off for 2 days, has not had a period in a while is on depo provera.Last injection 03/17/16.  Review of Systems Bleeding heavy with clots on and off for 2 days Reviewed past medical,surgical, social and family history. Reviewed medications and allergies.     Objective:   Physical Exam BP 130/80 (BP Location: Left Arm, Patient Position: Sitting, Cuff Size: Normal)   Pulse 72   Ht 5\' 6"  (1.676 m)   Wt 169 lb (76.7 kg)   LMP 04/07/2016   BMI 27.28 kg/m UPT negative,Skin warm and dry.Pelvic: external genitalia is normal in appearance no lesions, vagina: period like blood,urethra has no lesions or masses noted, cervix: bulbous,no CMT, uterus: normal size, shape and contour, non tender, no masses felt, adnexa: no masses or tenderness noted. Bladder is non tender and no masses felt. PHQ 2 0.    Assessment:     1. Menorrhagia with irregular cycle   2. Pregnancy examination or test, negative result       Plan:     Check CBC,QHCG Return in 1 day for [place]

## 2016-04-10 ENCOUNTER — Ambulatory Visit (INDEPENDENT_AMBULATORY_CARE_PROVIDER_SITE_OTHER): Payer: Medicaid Other

## 2016-04-10 DIAGNOSIS — N921 Excessive and frequent menstruation with irregular cycle: Secondary | ICD-10-CM | POA: Diagnosis not present

## 2016-04-10 LAB — CBC
HEMATOCRIT: 36.8 % (ref 34.0–46.6)
HEMOGLOBIN: 12.5 g/dL (ref 11.1–15.9)
MCH: 22.9 pg — ABNORMAL LOW (ref 26.6–33.0)
MCHC: 34 g/dL (ref 31.5–35.7)
MCV: 68 fL — ABNORMAL LOW (ref 79–97)
Platelets: 331 10*3/uL (ref 150–379)
RBC: 5.45 x10E6/uL — ABNORMAL HIGH (ref 3.77–5.28)
RDW: 16.4 % — ABNORMAL HIGH (ref 12.3–15.4)
WBC: 10.1 10*3/uL (ref 3.4–10.8)

## 2016-04-10 LAB — BETA HCG QUANT (REF LAB): hCG Quant: <1 m[IU]/mL

## 2016-04-10 NOTE — Progress Notes (Addendum)
PELVIC US TA/TV: heterogeneous anteverted uterus,fundal rt intramural fibroid .7 x.5 x .6 cm,mult myometrial calcification posterior,EEC 6.3 mm,endometrium contains a 1.1 x .6 x 1 cm hypoechoic mass w/ a single stalk of color flow,normal ov's bilat(mobile),no free fluid,no pain during ultrasound        Clinical Impression and recommendations:  I have reviewed the sonogram results above, combined with the patient's current clinical course, below are my impressions and any appropriate recommendations for management based on the sonographic findings.  Uterus with small myoma, overall normal size Endometrium reveals a small polyp Both ovaries are normal   [name],[name] 04/13/2016 5:21 PM

## 2016-04-11 ENCOUNTER — Telehealth: Payer: Self-pay | Admitting: Adult Health

## 2016-04-11 NOTE — Telephone Encounter (Signed)
Left message to call me back about [place], can call back before lunch

## 2016-04-14 ENCOUNTER — Telehealth: Payer: Self-pay | Admitting: Adult Health

## 2016-04-14 NOTE — Telephone Encounter (Signed)
Pt aware of [place] and has small polyp, talk with Dr Glo Herring can remove at time of tubal.

## 2016-04-15 ENCOUNTER — Ambulatory Visit (INDEPENDENT_AMBULATORY_CARE_PROVIDER_SITE_OTHER): Payer: Medicaid Other | Admitting: Family Medicine

## 2016-04-15 ENCOUNTER — Encounter: Payer: Self-pay | Admitting: Family Medicine

## 2016-04-15 VITALS — BP 122/72 | HR 75 | Ht 66.0 in | Wt 167.1 lb

## 2016-04-15 DIAGNOSIS — I10 Essential (primary) hypertension: Secondary | ICD-10-CM | POA: Diagnosis not present

## 2016-04-15 DIAGNOSIS — E876 Hypokalemia: Secondary | ICD-10-CM

## 2016-04-15 DIAGNOSIS — R1013 Epigastric pain: Secondary | ICD-10-CM

## 2016-04-15 DIAGNOSIS — R7303 Prediabetes: Secondary | ICD-10-CM

## 2016-04-15 DIAGNOSIS — E663 Overweight: Secondary | ICD-10-CM

## 2016-04-15 DIAGNOSIS — E041 Nontoxic single thyroid nodule: Secondary | ICD-10-CM

## 2016-04-15 DIAGNOSIS — E559 Vitamin D deficiency, unspecified: Secondary | ICD-10-CM | POA: Diagnosis not present

## 2016-04-15 NOTE — Patient Instructions (Addendum)
F/u in 4.5 month, call if you need me before  CBc, fasting lipid, cmp,TSH, Vit D, hBA1C, vit D in am  Get mammogram asap  Get flu vaccine  Need thyroid US will refer,  So call by end of week for appt info  It is important that you exercise regularly at least 30 minutes 5 times a week. If you develop chest pain, have severe difficulty breathing, or feel very tired, stop exercising immediately and seek medical attention \   Please work on good  health habits so that your health will improve. 1. Commitment to daily physical activity for 30 to 60  minutes, if you are able to do this.  2. Commitment to wise food choices. Aim for half of your  food intake to be vegetable and fruit, one quarter starchy foods, and one quarter protein. Try to eat on a regular schedule  3 meals per day, snacking between meals should be limited to vegetables or fruits or small portions of nuts. 64 ounces of water per day is generally recommended, unless you have specific health conditions, like heart failure or kidney failure where you will need to limit fluid intake.  3. Commitment to sufficient and a  good quality of physical and mental rest daily, generally between 6 to 8 hours per day.  WITH PERSISTANCE AND PERSEVERANCE, THE IMPOSSIBLE , BECOMES THE NORM!

## 2016-04-15 NOTE — Progress Notes (Signed)
[name]     MRN: [id number]      DOB: April 16, 1971   HPI [name] is here for follow up and re-evaluation of chronic medical conditions, medication management and review of any available recent lab and radiology data.  Preventive health is updated, specifically  Cancer screening and Immunization.   Questions or concerns regarding consultations or procedures which the PT has had in the interim are  addressed. The PT denies any adverse reactions to current medications since the last visit.   3week h/o palpitations daily and left chest pain and central chest pain at reast, reports excess stress, has seen card in past, further review, states symptoms resolved once she resumed potassium supplement  ROS Denies recent fever or chills. Denies sinus pressure, nasal congestion, ear pain or sore throat. Denies chest congestion, productive cough or wheezing. Denies chest pains, PND, orthopnea and leg swelling Denies abdominal pain, nausea, vomiting,diarrhea or constipation.   Denies dysuria, frequency, hesitancy or incontinence. Denies joint pain, swelling and limitation in mobility. Denies headaches, seizures, numbness, or tingling. Denies depression,  or insomnia. Denies skin break down or rash.   PE  BP 122/72 (BP Location: Left Arm, Patient Position: Sitting, Cuff Size: Normal)   Pulse 75   Ht 5\' 6"  (1.676 m)   Wt 167 lb 1.3 oz (75.8 kg)   LMP 04/07/2016   SpO2 98%   BMI 26.97 kg/m   Patient alert and oriented and in no cardiopulmonary distress.  HEENT: No facial asymmetry, EOMI,   oropharynx pink and moist.  Neck supple no JVD, goiter with nodules  Chest: Clear to auscultation bilaterally.  CVS: S1, S2 no murmurs, no S3.Regular rate.  ABD: Soft non tender.   Ext: No edema  MS: Adequate ROM spine, shoulders, hips and knees.  Skin: Intact, no ulcerations or rash noted.  Psych: Good eye contact, normal affect. Memory intact not anxious or depressed appearing.  CNS: CN  2-12 intact, power,  normal throughout.no focal deficits noted.   Assessment & Plan  Essential hypertension Controlled, no change in medication DASH diet and commitment to daily physical activity for a minimum of 30 minutes discussed and encouraged, as a part of hypertension management. The importance of attaining a healthy weight is also discussed.  BP/Weight 04/15/2016 04/09/2016 04/02/2016 12/19/2015 11/28/2015 06/28/2015 [id number]  Systolic BP 123XX123 [id number] 123456 [id number] 123XX123 123XX123 123456  Diastolic BP 72 80 80 60 82 60 80  Wt. (Lbs) 167.08 169 169 165 167 167 169  BMI 26.97 27.28 27.28 26.64 26.15 26.55 27.29       THYROID NODULE Needs updated  [place], last imaging study in 2013 reports multiple nodules  Overweight (BMI 25.0-29.9) Deteriorated. Patient re-educated about  the importance of commitment to a  minimum of 150 minutes of exercise per week.  The importance of healthy food choices with portion control discussed. Encouraged to start a food diary, count calories and to consider  joining a support group. Sample diet sheets offered. Goals set by the patient for the next several months.   Weight /BMI 04/15/2016 04/09/2016 04/02/2016  WEIGHT 167 lb 1.3 oz 169 lb 169 lb  HEIGHT 5\' 6"  5\' 6"  5\' 6"   BMI 26.97 kg/m2 27.28 kg/m2 27.28 kg/m2      Prediabetes Patient educated about the importance of limiting  Carbohydrate intake , the need to commit to daily physical activity for a minimum of 30 minutes , and to commit weight loss. The fact that changes in all these  areas will reduce or eliminate all together the development of diabetes is stressed.  Updated lab needed at/ before next visit.  Diabetic Labs Latest Ref Rng & Units 05/17/2014 07/23/2013 07/27/2012 04/07/2012 08/25/2011  HbA1c <5.7 % 5.9(H) 5.6 5.9(H) - 5.9(H)  Chol 0 - 200 mg/dL - 130 - - 149  HDL >39 mg/dL - 38(L) - - 42  Calc LDL 0 - 99 mg/dL - 79 - - 94  Triglycerides <150 mg/dL - 67 - - 67  Creatinine 0.50 - 1.10 mg/dL 0.86 0.85  0.89 0.91 0.99   BP/Weight 04/15/2016 04/09/2016 04/02/2016 12/19/2015 11/28/2015 06/28/2015 [id number]  Systolic BP 123XX123 [id number] 123456 A999333 123XX123 123XX123 123456  Diastolic BP 72 80 80 60 82 60 80  Wt. (Lbs) 167.08 169 169 165 167 167 169  BMI 26.97 27.28 27.28 26.64 26.15 26.55 27.29   No flowsheet data found.    HYPOKALEMIA Recurrent problem , pt tends to be non compliant, and this is persisting, re educated re danger of hypokalemia Updated lab needed .   Vitamin D deficiency Updated lab needed at/ before next visit.

## 2016-04-20 NOTE — Assessment & Plan Note (Signed)
Updated lab needed at/ before next visit.   

## 2016-04-20 NOTE — Assessment & Plan Note (Signed)
Recurrent problem , pt tends to be non compliant, and this is persisting, re educated re danger of hypokalemia Updated lab needed .

## 2016-04-20 NOTE — Assessment & Plan Note (Signed)
Deteriorated. Patient re-educated about  the importance of commitment to a  minimum of 150 minutes of exercise per week.  The importance of healthy food choices with portion control discussed. Encouraged to start a food diary, count calories and to consider  joining a support group. Sample diet sheets offered. Goals set by the patient for the next several months.   Weight /BMI 04/15/2016 04/09/2016 04/02/2016  WEIGHT 167 lb 1.3 oz 169 lb 169 lb  HEIGHT 5\' 6"  5\' 6"  5\' 6"   BMI 26.97 kg/m2 27.28 kg/m2 27.28 kg/m2

## 2016-04-20 NOTE — Assessment & Plan Note (Signed)
Controlled, no change in medication DASH diet and commitment to daily physical activity for a minimum of 30 minutes discussed and encouraged, as a part of hypertension management. The importance of attaining a healthy weight is also discussed.  BP/Weight 04/15/2016 04/09/2016 04/02/2016 12/19/2015 11/28/2015 06/28/2015 [id number]  Systolic BP 123XX123 [id number] 123456 [id number] 123XX123 123XX123 123456  Diastolic BP 72 80 80 60 82 60 80  Wt. (Lbs) 167.08 169 169 165 167 167 169  BMI 26.97 27.28 27.28 26.64 26.15 26.55 27.29

## 2016-04-20 NOTE — Assessment & Plan Note (Addendum)
Needs updated  [place], last imaging study in 2013 reports multiple nodules

## 2016-04-20 NOTE — Assessment & Plan Note (Signed)
Patient educated about the importance of limiting  Carbohydrate intake , the need to commit to daily physical activity for a minimum of 30 minutes , and to commit weight loss. The fact that changes in all these areas will reduce or eliminate all together the development of diabetes is stressed.  Updated lab needed at/ before next visit.  Diabetic Labs Latest Ref Rng & Units 05/17/2014 07/23/2013 07/27/2012 04/07/2012 08/25/2011  HbA1c <5.7 % 5.9(H) 5.6 5.9(H) - 5.9(H)  Chol 0 - 200 mg/dL - 130 - - 149  HDL >39 mg/dL - 38(L) - - 42  Calc LDL 0 - 99 mg/dL - 79 - - 94  Triglycerides <150 mg/dL - 67 - - 67  Creatinine 0.50 - 1.10 mg/dL 0.86 0.85 0.89 0.91 0.99   BP/Weight 04/15/2016 04/09/2016 04/02/2016 12/19/2015 11/28/2015 06/28/2015 [id number]  Systolic BP 123XX123 [id number] 123456 A999333 123XX123 123XX123 123456  Diastolic BP 72 80 80 60 82 60 80  Wt. (Lbs) 167.08 169 169 165 167 167 169  BMI 26.97 27.28 27.28 26.64 26.15 26.55 27.29   No flowsheet data found.

## 2016-04-21 ENCOUNTER — Ambulatory Visit (INDEPENDENT_AMBULATORY_CARE_PROVIDER_SITE_OTHER): Payer: Medicaid Other | Admitting: Obstetrics and Gynecology

## 2016-04-21 VITALS — BP 110/72 | HR 58 | Ht 66.0 in | Wt 166.4 lb

## 2016-04-21 DIAGNOSIS — N920 Excessive and frequent menstruation with regular cycle: Secondary | ICD-10-CM

## 2016-04-21 DIAGNOSIS — Z3009 Encounter for other general counseling and advice on contraception: Secondary | ICD-10-CM

## 2016-04-21 DIAGNOSIS — Z8742 Personal history of other diseases of the female genital tract: Secondary | ICD-10-CM

## 2016-04-21 NOTE — Progress Notes (Signed)
Preoperative History and Physical  [name] is a 45 y.o. [id number] here for surgical management of menorrhagia status post uterine polyp. No significant preoperative concerns.  Proposed surgery: bilateral salpingectomy and hysteroscopy, dilation and curettage, with endometrial ablation. Polyp identified on ultrasound will be removed at the same time  Past Medical History:  Diagnosis Date  . Back pain   . BV (bacterial vaginosis) 09/16/2013  . Chest pain    right   . Contraceptive management 09/16/2013  . Encephalocele ([place]) 1994  . Enlarged Sandra   . HSV-2 (herpes simplex virus 2) infection   . Hypertension   . Irregular intermenstrual bleeding 04/27/2015  . Obesity   . Preterm labor   . Sleep disorder   . Sandra nodule   . Urinary frequency 12/14/2014  . Vaginal discharge 09/16/2013   Past Surgical History:  Procedure Laterality Date  . BREAST BIOPSY    . btl  1994  . RETAINED PLACENTA REMOVAL    . tubal ligation reversal  2001  . WISDOM TOOTH EXTRACTION     OB History  Gravida Para Term Preterm AB Living  6 5 2 3 1 5   SAB TAB Ectopic Multiple Live Births  0 1 0 0      # Outcome Date GA Lbr Len/2nd Weight Sex Delivery Anes PTL Lv  6 TAB           5 Preterm           4 Preterm           3 Preterm           2 Term           1 Term             Patient denies any other pertinent gynecologic issues.   Current Outpatient Prescriptions on File Prior to Visit  Medication Sig Dispense Refill  . DILTIAZEM CD 120 MG 24 hr capsule TAKE 1 CAPSULE BY MOUTH EVERY DAY 30 capsule 3  . EPINEPHrine 0.3 mg/0.3 mL IJ SOAJ injection Inject 0.3 mLs (0.3 mg total) into the muscle once. 1 Device 1  . hydrochlorothiazide (HYDRODIURIL) 25 MG tablet TAKE 1 TABLET (25 MG TOTAL) BY MOUTH DAILY. 30 tablet 4  . MedroxyPROGESTERone Acetate 150 MG/ML SUSY INJECT 1ML INTO MUSCLE EVERY 3 MONTHS  4  . metoprolol tartrate (LOPRESSOR) 25 MG tablet TAKE 1 TABLET EVERY MORNING AND 1/2 TABLET IN THE  EVENING 45 tablet 4  . potassium chloride SA (KLOR-CON M20) 20 MEQ tablet TAKE 1 TABLET BY MOUTH 3 TIMES DAILY. 270 tablet 5   No current facility-administered medications on file prior to visit.    Allergies  Allergen Reactions  . Biaxin [Clarithromycin] Diarrhea, Nausea And Vomiting and Other (See Comments)    Blood in stool  . [place] Venom [Bee Venom] Swelling    Social History:   reports that she has never smoked. She has never used smokeless tobacco. She reports that she does not drink alcohol or use drugs.  Family History  Problem Relation Age of Onset  . [name]   . [name]   . [name]     Sandra   . Hypertension Weaver   . [name]     breast  . [name]     Review of Systems: Noncontributory Light menses since depo provera, heavy prevously. Please see noted by JAGriffin this year. No respiratiory or cardiac symptoms. No anesthesia complications. PHYSICAL  EXAM: Blood pressure 110/72, pulse (!) 58, height 5\' 6"  (1.676 m), weight 166 lb 6.4 oz (75.5 kg), last menstrual period 04/07/2016. General appearance - alert, well appearing, and in no distress Chest - clear to auscultation, no wheezes, rales or rhonchi, symmetric air entry Heart - normal rate and regular rhythm, heart rate 60-70 Abdomen - soft, nontender, nondistended, no masses or organomegaly Pelvic exam: VULVA: normal appearing vulva with no masses, tenderness or lesions,  VAGINA: normal appearing vagina with normal color and discharge, no lesions,  CERVIX: ? S/p leep, is everted, normal secretions UTERUS: uterus is normal size, shape, consistency and nontender,  ADNEXA: normal adnexa in size, nontender and no masses.  Extremities - peripheral pulses normal, no pedal edema, no clubbing or cyanosis  Labs: Results for orders placed or performed in visit on 04/09/16 (from the past 336 hour(s))  CBC   Collection Time: 04/09/16  4:57 PM   Result Value Ref Range   WBC 10.1 3.4 - 10.8 x10E3/uL   RBC 5.45 (H) 3.77 - 5.28 x10E6/uL   Hemoglobin 12.5 11.1 - 15.9 g/dL   Hematocrit 36.8 34.0 - 46.6 %   MCV 68 (L) 79 - 97 fL   MCH 22.9 (L) 26.6 - 33.0 pg   MCHC 34.0 31.5 - 35.7 g/dL   RDW 16.4 (H) 12.3 - 15.4 %   Platelets 331 150 - 379 x10E3/uL  Beta HCG, Quant   Collection Time: 04/09/16  4:57 PM  Result Value Ref Range   hCG Quant <1 mIU/mL  Results for orders placed or performed in visit on 04/09/16 (from the past 336 hour(s))  POCT urine pregnancy   Collection Time: 04/09/16  4:43 PM  Result Value Ref Range   Preg Test, Ur Negative Negative    Imaging Studies: US Transvaginal Non-ob  Result Date: 04/13/2016 GYNECOLOGIC SONOGRAM [name] is a 45 y.o. [id number] LMP  for a pelvic sonogram for menorrhagia. Uterus                      6.3 x 3.5 x 4.9 cm, heterogeneous anteverted uterus,fundal right intramural fibroid .7 x .5 x .6 cm,mult. posterior myometrial                                                       calcification Endometrium          6.3 mm, asymmetrical, endometrium contains a 1.1 x .6 x 1 cm hypoechoic mass w/ a single stalk of color flow. Right ovary             3.3 x 1.5 x 2.6 cm, wnl Left ovary                3 x 2.5 x 2.5 cm, wnl No free fluid seen. Technician Comments: PELVIC US TA/TV: heterogeneous anteverted uterus,fundal right intramural fibroid .7 x.5 x .6 cm,mult posterior myometrial calcification,EEC 6.3 mm,endometrium contains a 1.1 x .6 x 1 cm hypoechoic mass w/ a single stalk of color flow,normal ov's bilat(mobile),no free fluid,no pain during ultrasound     [employer] 04/10/2016 1:12 PM Clinical Impression and recommendations: I have reviewed the sonogram results above, combined with the patient's current clinical course, below are my impressions and any appropriate recommendations for management based on the sonographic findings. Uterus  with  small myoma, clinically insignificant Endometrium with  small polyp Normal ovaries [name],[name] 04/13/2016 5:23 PM Florian Buff, MD 04/13/2016 5:17 PM   US Pelvis Complete  Result Date: 04/13/2016 GYNECOLOGIC SONOGRAM [name] is a 45 y.o. [id number] LMP  for a pelvic sonogram for menorrhagia. Uterus                      6.3 x 3.5 x 4.9 cm, heterogeneous anteverted uterus,fundal right intramural fibroid .7 x .5 x .6 cm,mult. posterior myometrial                                                       calcification Endometrium          6.3 mm, asymmetrical, endometrium contains a 1.1 x .6 x 1 cm hypoechoic mass w/ a single stalk of color flow. Right ovary             3.3 x 1.5 x 2.6 cm, wnl Left ovary                3 x 2.5 x 2.5 cm, wnl No free fluid seen. Technician Comments: PELVIC US TA/TV: heterogeneous anteverted uterus,fundal right intramural fibroid .7 x.5 x .6 cm,mult posterior myometrial calcification,EEC 6.3 mm,endometrium contains a 1.1 x .6 x 1 cm hypoechoic mass w/ a single stalk of color flow,normal ov's bilat(mobile),no free fluid,no pain during ultrasound     [employer] 04/10/2016 1:12 PM Clinical Impression and recommendations: I have reviewed the sonogram results above, combined with the patient's current clinical course, below are my impressions and any appropriate recommendations for management based on the sonographic findings. Uterus  with small myoma, clinically insignificant Endometrium with small polyp Normal ovaries [name],[name] 04/13/2016 5:23 PM Florian Buff, MD 04/13/2016 5:17 PM    Assessment: desire for sterilization                       History of menorrhagia, currently on Depo-Provera suppression                       Endometrial polyp  Patient Active Problem List   Diagnosis Date Noted  . Protein in urine 04/02/2016  . Dyspepsia 12/02/2015  . Multiple allergies 11/28/2015  . Irregular intermenstrual bleeding 04/27/2015  . Prediabetes 08/01/2012  . Overweight (BMI 25.0-29.9) 08/01/2012  . Vitamin D deficiency  07/28/2012  . HYPOKALEMIA 03/14/2008  . Sandra NODULE 10/26/2007  . Essential hypertension 10/26/2007   Lengthy discussion of procedure details, results, risks, and benefits.   Plan: Patient will undergo surgical management with bilateral salpingectomy and hysteroscopy D&C with Novasure endometrial ablation. .   04/21/2016 9:25 AM  By signing my name below, I, Sonum Patel, attest that this documentation has been prepared under the direction and in the presence of Jonnie Kind, MD. Electronically Signed: Sonum Patel, Education administrator. 04/21/16. 9:25 AM.  I personally performed the services described in this documentation, which was SCRIBED in my presence. The recorded information has been reviewed and considered accurate. It has been edited as necessary during review. Jonnie Kind, MD

## 2016-04-22 NOTE — Patient Instructions (Signed)
[name]  04/22/2016     @PREFPERIOPPHARMACY @   Your procedure is scheduled on  04/25/2016   Report to [facility] at  615  A.M.  Call this number if you have problems the morning of surgery:  [phone]   Remember:  Do not eat food or drink liquids after midnight.  Take these medicines the morning of surgery with A SIP OF WATER  Diltazem, metoprolol.   Do not wear jewelry, make-up or nail polish.  Do not wear lotions, powders, or perfumes, or deoderant.  Do not shave 48 hours prior to surgery.  Men may shave face and neck.  Do not bring valuables to the hospital.  [facility] is not responsible for any belongings or valuables.  Contacts, dentures or bridgework may not be worn into surgery.  Leave your suitcase in the car.  After surgery it may be brought to your room.  For patients admitted to the hospital, discharge time will be determined by your treatment team.  Patients discharged the day of surgery will not be allowed to drive home.   Name and phone number of your driver:   family Special instructions:  none  Please read over the following fact sheets that you were given. Anesthesia Post-op Instructions and Care and Recovery After Surgery      Hysteroscopy Hysteroscopy is a procedure used for looking inside the womb (uterus). It may be done for various reasons, including:  To evaluate abnormal bleeding, fibroid (benign, noncancerous) tumors, polyps, scar tissue (adhesions), and possibly cancer of the uterus.  To look for lumps (tumors) and other uterine growths.  To look for causes of why a woman cannot get pregnant (infertility), causes of recurrent loss of pregnancy (miscarriages), or a lost intrauterine device (IUD).  To perform a sterilization by blocking the fallopian tubes from inside the uterus. In this procedure, a thin, flexible tube with a tiny light and camera on the end of it (hysteroscope) is used to look  inside the uterus. A hysteroscopy should be done right after a menstrual period to be sure you are not pregnant. LET [facility] CARE PROVIDER KNOW ABOUT:   Any allergies you have.  All medicines you are taking, including vitamins, herbs, eye drops, creams, and over-the-counter medicines.  Previous problems you or members of your family have had with the use of anesthetics.  Any blood disorders you have.  Previous surgeries you have had.  Medical conditions you have. RISKS AND COMPLICATIONS  Generally, this is a safe procedure. However, as with any procedure, complications can occur. Possible complications include:  Putting a hole in the uterus.  Excessive bleeding.  Infection.  Damage to the cervix.  Injury to other organs.  Allergic reaction to medicines.  Too much fluid used in the uterus for the procedure. BEFORE THE PROCEDURE   Ask your health care provider about changing or stopping any regular medicines.  Do not take aspirin or blood thinners for 1 week before the procedure, or as directed by your health care provider. These can cause bleeding.  If you smoke, do not smoke for 2 weeks before the procedure.  In some cases, a medicine is placed in the cervix the day before the procedure. This medicine makes the cervix have a larger opening (dilate). This makes it easier for the instrument to be inserted into the uterus during the  procedure.  Do not eat or drink anything for at least 8 hours before the surgery.  Arrange for someone to take you home after the procedure. PROCEDURE   You may be given a medicine to relax you (sedative). You may also be given one of the following:  A medicine that numbs the area around the cervix (local anesthetic).  A medicine that makes you sleep through the procedure (general anesthetic).  The hysteroscope is inserted through the vagina into the uterus. The camera on the hysteroscope sends a picture to a TV screen. This gives the  surgeon a good view inside the uterus.  During the procedure, air or a liquid is put into the uterus, which allows the surgeon to see better.  Sometimes, tissue is gently scraped from inside the uterus. These tissue samples are sent to a lab for testing. AFTER THE PROCEDURE   If you had a general anesthetic, you may be groggy for a couple hours after the procedure.  If you had a local anesthetic, you will be able to go home as soon as you are stable and feel ready.  You may have some cramping. This normally lasts for a couple days.  You may have bleeding, which varies from light spotting for a few days to menstrual-like bleeding for 3-7 days. This is normal.  If your test results are not back during the visit, make an appointment with your health care provider to find out the results.   This information is not intended to replace advice given to you by your health care provider. Make sure you discuss any questions you have with your health care provider.   Document Released: 09/29/2000 Document Revised: 04/13/2013 Document Reviewed: 01/20/2013 Elsevier Interactive Patient Education 2016 [place]. Hysteroscopy, Care After Refer to this sheet in the next few weeks. These instructions provide you with information on caring for yourself after your procedure. Your health care provider may also give you more specific instructions. Your treatment has been planned according to current medical practices, but problems sometimes occur. Call your health care provider if you have any problems or questions after your procedure.  WHAT TO EXPECT AFTER THE PROCEDURE After your procedure, it is typical to have the following:  You may have some cramping. This normally lasts for a couple days.  You may have bleeding. This can vary from light spotting for a few days to menstrual-like bleeding for 3-7 days. HOME CARE INSTRUCTIONS  Rest for the first 1-2 days after the procedure.  Only take  over-the-counter or prescription medicines as directed by your health care provider. Do not take aspirin. It can increase the chances of bleeding.  Take showers instead of baths for 2 weeks or as directed by your health care provider.  Do not drive for 24 hours or as directed.  Do not drink alcohol while taking pain medicine.  Do not use tampons, douche, or have sexual intercourse for 2 weeks or until your health care provider says it is okay.  Take your temperature twice a day for 4-5 days. Write it down each time.  Follow your health care provider's advice about diet, exercise, and lifting.  If you develop constipation, you may:  Take a mild laxative if your health care provider approves.  Add bran foods to your diet.  Drink enough fluids to keep your urine clear or pale yellow.  Try to have someone with you or available to you for the first 24-48 hours, especially if you were given  a general anesthetic.  Follow up with your health care provider as directed. SEEK MEDICAL CARE IF:  You feel dizzy or lightheaded.  You feel sick to your stomach (nauseous).  You have abnormal vaginal discharge.  You have a rash.  You have pain that is not controlled with medicine. SEEK IMMEDIATE MEDICAL CARE IF:  You have bleeding that is heavier than a normal menstrual period.  You have a fever.  You have increasing cramps or pain, not controlled with medicine.  You have new belly (abdominal) pain.  You pass out.  You have pain in the tops of your shoulders (shoulder strap areas).  You have shortness of breath.   This information is not intended to replace advice given to you by your health care provider. Make sure you discuss any questions you have with your health care provider.   Document Released: 04/13/2013 Document Reviewed: 04/13/2013 Elsevier Interactive Patient Education 2016 [employer]vier [employer] Dilation and Curettage or Vacuum Curettage Dilation and curettage (D&C) and  vacuum curettage are minor procedures. A D&C involves stretching (dilation) the cervix and scraping (curettage) the inside lining of the womb (uterus). During a D&C, tissue is gently scraped from the inside lining of the uterus. During a vacuum curettage, the lining and tissue in the uterus are removed with the use of gentle suction.  Curettage may be performed to either diagnose or treat a problem. As a diagnostic procedure, curettage is performed to examine tissues from the uterus. A diagnostic curettage may be performed for the following symptoms:   Irregular bleeding in the uterus.   Bleeding with the development of clots.   Spotting between menstrual periods.   Prolonged menstrual periods.   Bleeding after menopause.   No menstrual period (amenorrhea).   A change in size and shape of the uterus.  As a treatment procedure, curettage may be performed for the following reasons:   Removal of an IUD (intrauterine device).   Removal of retained placenta after giving birth. Retained placenta can cause an infection or bleeding severe enough to require transfusions.   Abortion.   Miscarriage.   Removal of polyps inside the uterus.   Removal of uncommon types of noncancerous lumps (fibroids).  LET [facility] CARE PROVIDER KNOW ABOUT:   Any allergies you have.   All medicines you are taking, including vitamins, herbs, eye drops, creams, and over-the-counter medicines.   Previous problems you or members of your family have had with the use of anesthetics.   Any blood disorders you have.   Previous surgeries you have had.   Medical conditions you have. RISKS AND COMPLICATIONS  Generally, this is a safe procedure. However, as with any procedure, complications can occur. Possible complications include:  Excessive bleeding.   Infection of the uterus.   Damage to the cervix.   Development of scar tissue (adhesions) inside the uterus, later causing abnormal  amounts of menstrual bleeding.   Complications from the general anesthetic, if a general anesthetic is used.   Putting a hole (perforation) in the uterus. This is rare.  BEFORE THE PROCEDURE   Eat and drink before the procedure only as directed by your health care provider.   Arrange for someone to take you home.  PROCEDURE  This procedure usually takes about 15-30 minutes.  You will be given one of the following:  A medicine that numbs the area in and around the cervix (local anesthetic).   A medicine to make you sleep through the procedure (general anesthetic).  You will lie on your back with your legs in stirrups.   A warm metal or plastic instrument (speculum) will be placed in your vagina to keep it open and to allow the health care provider to see the cervix.  There are two ways in which your cervix can be softened and dilated. These include:   Taking a medicine.   Having thin rods (laminaria) inserted into your cervix.   A curved tool (curette) will be used to scrape cells from the inside lining of the uterus. In some cases, gentle suction is applied with the curette. The curette will then be removed.  AFTER THE PROCEDURE   You will rest in the recovery area until you are stable and are ready to go home.   You may feel sick to your stomach (nauseous) or throw up (vomit) if you were given a general anesthetic.   You may have a sore throat if a tube was placed in your throat during general anesthesia.   You may have light cramping and bleeding. This may last for 2 days to 2 weeks after the procedure.   Your uterus needs to make a new lining after the procedure. This may make your next period late.   This information is not intended to replace advice given to you by your health care provider. Make sure you discuss any questions you have with your health care provider.   Document Released: 06/23/2005 Document Revised: 02/23/2013 Document Reviewed:  01/20/2013 Elsevier Interactive Patient Education 2016 Elsevier Inc[employer]  Dilation and Curettage or Vacuum Curettage, Care After These instructions give you information on caring for yourself after your procedure. Your doctor may also give you more specific instructions. Call your doctor if you have any problems or questions after your procedure. HOME CARE  Do not drive for 24 hours.  Wait 1 week before doing any activities that wear you out.  Take your temperature 2 times a day for 4 days. Write it down. Tell your doctor if you have a fever.  Do not stand for a long time.  Do not lift, push, or pull anything over 10 pounds (4.5 kilograms).  Limit stair climbing to once or twice a day.  Rest often.  Continue with your usual diet.  Drink enough fluids to keep your pee (urine) clear or pale yellow.  If you have a hard time pooping (constipation), you may:  Take a medicine to help you go poop (laxative) as told by your doctor.  Eat more fruit and bran.  Drink more fluids.  Take showers, not baths, for as long as told by your doctor.  Do not swim or use a hot tub until your doctor says it is okay.  Have someone with you for 1-2 days after the procedure.  Do not douche, use tampons, or have sex (intercourse) for 2 weeks.  Only take medicines as told by your doctor. Do not take aspirin. It can cause bleeding.  Keep all doctor visits. GET HELP IF:  You have cramps or pain not helped by medicine.  You have new pain in the belly (abdomen).  You have a bad smelling fluid coming from your vagina.  You have a rash.  You have problems with any medicine. GET HELP RIGHT AWAY IF:   You start to bleed more than a regular period.  You have a fever.  You have chest pain.  You have trouble breathing.  You feel dizzy or feel like passing out (fainting).  You pass  out.  You have pain in the tops of your shoulders.  You have vaginal bleeding with or without clumps of  blood (blood clots). MAKE SURE YOU:  Understand these instructions.  Will watch your condition.  Will get help right away if you are not doing well or get worse.   This information is not intended to replace advice given to you by your health care provider. Make sure you discuss any questions you have with your health care provider.   Document Released: 04/01/2008 Document Revised: 06/28/2013 Document Reviewed: 01/20/2013 Elsevier Interactive Patient Education 2016 [place].  Endometrial Ablation Endometrial ablation removes the lining of the uterus (endometrium). It is usually a same-day, outpatient treatment. Ablation helps avoid major surgery, such as surgery to remove the cervix and uterus (hysterectomy). After endometrial ablation, you will have little or no menstrual bleeding and may not be able to have children. However, if you are premenopausal, you will need to use a reliable method of birth control following the procedure because of the small chance that pregnancy can occur. There are different reasons to have this procedure. These reasons include:  Heavy periods.  Bleeding that is causing anemia.  Irregular bleeding.  Bleeding fibroids on the lining inside the uterus if they are smaller than 3 centimeters. This procedure may not be possible for you if:   You want to have children in the future.   You have severe cramps with your menstrual period.   You have precancerous or cancerous cells in your uterus.   You were recently pregnant.   You have gone through menopause.   You have had major surgery on your uterus, resulting in thinning of the uterine wall. Surgeries may include:  The removal of one or more uterine fibroids (myomectomy).  A cesarean section with a classic (vertical) incision on your uterus. Ask your health care provider what type of cesarean you had. Sometimes the scar on your skin is different than the scar on your uterus. Even if you have  had surgery on your uterus, certain types of ablation may still be safe for you. Talk with your health care provider. LET [facility] CARE PROVIDER KNOW ABOUT:  Any allergies you have.  All medicines you are taking, including vitamins, herbs, eye drops, creams, and over-the-counter medicines.  Previous problems you or members of your family have had with the use of anesthetics.  Any blood disorders you have.  Previous surgeries you have had.  Medical conditions you have. RISKS AND COMPLICATIONS  Generally, this is a safe procedure. However, as with any procedure, complications can occur. Possible complications include:  Perforation of the uterus.  Bleeding.  Infection of the uterus, bladder, or vagina.  Injury to surrounding organs.  An air bubble to the lung (air embolus).  Pregnancy following the procedure.  Failure of the procedure to help the problem, requiring hysterectomy.  Decreased ability to diagnose cancer in the lining of the uterus. BEFORE THE PROCEDURE  The lining of the uterus must be tested to make sure there is no pre-cancerous or cancer cells present.  An ultrasound may be performed to look at the size of the uterus and to check for abnormalities.  Medicines may be given to thin the lining of the uterus. PROCEDURE  During the procedure, your health care provider will use a tool called a resectoscope to help see inside your uterus. There are different ways to remove the lining of your uterus.   Radiofrequency - This method uses a  radiofrequency-alternating electric current to remove the lining of the uterus.  Cryotherapy - This method uses extreme cold to freeze the lining of the uterus.  Heated-Free Liquid - This method uses heated salt (saline) solution to remove the lining of the uterus.  Microwave - This method uses high-energy microwaves to heat up the lining of the uterus to remove it.  Thermal balloon - This method involves inserting a catheter  with a balloon tip into the uterus. The balloon tip is filled with heated fluid to remove the lining of the uterus. AFTER THE PROCEDURE  After your procedure, do not have sexual intercourse or insert anything into your vagina until permitted by your health care provider. After the procedure, you may experience:  Cramps.  Vaginal discharge.  Frequent urination.   This information is not intended to replace advice given to you by your health care provider. Make sure you discuss any questions you have with your health care provider.   Document Released: 05/02/2004 Document Revised: 03/14/2015 Document Reviewed: 11/24/2012 Elsevier Interactive Patient Education 2016 [place], also called tubectomy, is the surgical removal of one of the fallopian tubes. The fallopian tubes are tubes that are connected to the uterus. These tubes transport the egg from the ovary to the uterus. A salpingectomy may be done for various reasons, including:   A tubal (ectopic) pregnancy. This is especially true if the tube ruptures.  An infected fallopian tube.  The need to remove the fallopian tube when removing an ovary with a cyst or tumor.  The need to remove the fallopian tube when removing the uterus.  Cancer of the fallopian tube or nearby organs. Removing one fallopian tube does not prevent you from becoming pregnant. It also does not cause problems with your menstrual periods.  LET [facility] CARE PROVIDER KNOW ABOUT:  Any allergies you have.  All medicines you are taking, including vitamins, herbs, eye drops, creams, and over-the-counter medicines.  Previous problems you or members of your family have had with the use of anesthetics.  Any blood disorders you have.  Previous surgeries you have had.  Medical conditions you have. RISKS AND COMPLICATIONS  Generally, this is a safe procedure. However, as with any procedure, complications can occur. Possible  complications include:  Injury to surrounding organs.  Bleeding.  Infection.  Problems related to anesthesia. BEFORE THE PROCEDURE  Ask your health care provider about changing or stopping your regular medicines. You may need to stop taking certain medicines, such as aspirin or blood thinners, at least 1 week before the surgery.  Do not eat or drink anything for at least 8 hours before the surgery.  If you smoke, do not smoke for at least 2 weeks before the surgery.  Make plans to have someone drive you home after the procedure or after your hospital stay. Also arrange for someone to help you with activities during recovery. PROCEDURE   You will be given medicine to help you relax before the procedure (sedative). You will then be given medicine to make you sleep through the procedure (general anesthetic). These medicines will be given through an IV access tube that is put into one of your veins.  Once you are asleep, your lower abdomen will be shaved and cleaned. A thin, flexible tube (catheter) will be placed in your bladder.  The surgeon may use a laparoscopic, robotic, or open technique for this surgery:  In the laparoscopic technique, the surgery is done through two small cuts (incisions)  in the abdomen. A thin, lighted tube with a tiny camera on the end (laparoscope) is inserted into one of the incisions. The tools needed for the procedure are put through the other incision.  A robotic technique may be chosen to perform complex surgery in a small space. In the robotic technique, small incisions will be made. A camera and surgical instruments are passed through the incisions. Surgical instruments will be controlled with the help of a robotic arm.  In the open technique, the surgery is done through one large incision in the abdomen.  Using any of these techniques, the surgeon removes the fallopian tube from where it attaches to the uterus. The blood vessels will be clamped and  tied.  The surgeon then uses staples or stitches to close the incision or incisions. AFTER THE PROCEDURE   You will be taken to a recovery area where your progress will be monitored for 1-3 hours.  If the laparoscopic technique was used, you may be allowed to go home after several hours. You may have some shoulder pain after the laparoscopic procedure. This is normal and usually goes away in a day or two.  If the open technique was used, you will be admitted to the hospital for a couple of days.  You will be given pain medicine if needed.  The IV access tube and catheter will be removed before you are discharged.   This information is not intended to replace advice given to you by your health care provider. Make sure you discuss any questions you have with your health care provider.   Document Released: 11/09/2008 Document Revised: 07/14/2014 Document Reviewed: 12/15/2012 Elsevier Interactive Patient Education 2016 [place] After Refer to this sheet in the next few weeks. These instructions provide you with information on caring for yourself after your procedure. Your health care provider may also give you more specific instructions. Your treatment has been planned according to current medical practices, but problems sometimes occur. Call your health care provider if you have any problems or questions after your procedure. WHAT TO EXPECT AFTER THE PROCEDURE After your procedure, it is typical to have the following:  Abdominal pain that can be controlled with pain medicine.  Vaginal spotting.  Tiredness. HOME CARE INSTRUCTIONS  Get plenty of rest and sleep.  Only take over-the-counter or prescription medicines as directed by your health care provider.  Keep incision areas clean and dry. Remove or change any bandages (dressings) only as directed by your health care provider.  You may resume your regular diet. Eat a well-balanced diet.  Drink enough  fluids to keep your urine clear or pale yellow.  Limit exercise and activities as directed by your health care provider. Do not lift anything heavier than 5 lb (2.3 kg) until your health care provider approves.  Do not drive until your health care provider approves.  Do not have sexual intercourse until your health care provider says it is okay.  Take your temperature twice a day for the first week. Write those temperatures down.  Follow up with your health care provider as directed. SEEK MEDICAL CARE IF:  You have pain when you urinate.  You see pus coming out of the incision, or the incision is separating.  You have increasing abdominal pain.  You have swelling or redness in the incision area.  You develop a rash.  You feel lightheaded.  You have pain that is not controlled with medicine. SEEK IMMEDIATE MEDICAL CARE IF:  You  develop a fever.  You have increasing abdominal pain.  You develop chest or leg pain.  You develop shortness of breath.  You pass out.   This information is not intended to replace advice given to you by your health care provider. Make sure you discuss any questions you have with your health care provider.   Document Released: 09/27/2010 Document Revised: 07/14/2014 Document Reviewed: 12/15/2012 Elsevier Interactive Patient Education 2016 [place] Anesthesia, Adult General anesthesia is a sleep-like state of non-feeling produced by medicines (anesthetics). General anesthesia prevents you from being alert and feeling pain during a medical procedure. Your caregiver may recommend general anesthesia if your procedure:  Is long.  Is painful or uncomfortable.  Would be frightening to see or hear.  Requires you to be still.  Affects your breathing.  Causes significant blood loss. LET YOUR CAREGIVER KNOW ABOUT:  Allergies to food or medicine.  Medicines taken, including vitamins, herbs, eyedrops, over-the-counter medicines, and  creams.  Use of steroids (by mouth or creams).  Previous problems with anesthetics or numbing medicines, including problems experienced by relatives.  History of bleeding problems or blood clots.  Previous surgeries and types of anesthetics received.  Possibility of pregnancy, if this applies.  Use of cigarettes, alcohol, or illegal drugs.  Any health condition(s), especially diabetes, sleep apnea, and high blood pressure. RISKS AND COMPLICATIONS General anesthesia rarely causes complications. However, if complications do occur, they can be life threatening. Complications include:  A lung infection.  A stroke.  A heart attack.  Waking up during the procedure. When this occurs, the patient may be unable to move and communicate that he or she is awake. The patient may feel severe pain. Older adults and adults with serious medical problems are more likely to have complications than adults who are young and healthy. Some complications can be prevented by answering all of your caregiver's questions thoroughly and by following all pre-procedure instructions. It is important to tell your caregiver if any of the pre-procedure instructions, especially those related to diet, were not followed. Any food or liquid in the stomach can cause problems when you are under general anesthesia. BEFORE THE PROCEDURE  Ask your caregiver if you will have to spend the night at the hospital. If you will not have to spend the night, arrange to have an adult drive you and stay with you for 24 hours.  Follow your caregiver's instructions if you are taking dietary supplements or medicines. Your caregiver may tell you to stop taking them or to reduce your dosage.  Do not smoke for as long as possible before your procedure. If possible, stop smoking 3-6 weeks before the procedure.  Do not take new dietary supplements or medicines within 1 week of your procedure unless your caregiver approves them.  Do not eat  within 8 hours of your procedure or as directed by your caregiver. Drink only clear liquids, such as water, black coffee (without milk or cream), and fruit juices (without pulp).  Do not drink within 3 hours of your procedure or as directed by your caregiver.  You may brush your teeth on the morning of the procedure, but make sure to spit out the toothpaste and water when finished. PROCEDURE  You will receive anesthetics through a mask, through an intravenous (IV) access tube, or through both. A doctor who specializes in anesthesia (anesthesiologist) or a nurse who specializes in anesthesia (nurse anesthetist) or both will stay with you throughout the procedure to make sure you  remain unconscious. He or she will also watch your blood pressure, pulse, and oxygen levels to make sure that the anesthetics do not cause any problems. Once you are asleep, a breathing tube or mask may be used to help you breathe. AFTER THE PROCEDURE You will wake up after the procedure is complete. You may be in the room where the procedure was performed or in a recovery area. You may have a sore throat if a breathing tube was used. You may also feel:  Dizzy.  Weak.  Drowsy.  Confused.  Nauseous.  Cold. These are all normal responses and can be expected to last for up to 24 hours after the procedure is complete. A caregiver will tell you when you are ready to go home. This will usually be when you are fully awake and in stable condition.   This information is not intended to replace advice given to you by your health care provider. Make sure you discuss any questions you have with your health care provider.   Document Released: 09/30/2007 Document Revised: 07/14/2014 Document Reviewed: 10/22/2011 Elsevier Interactive Patient Education 2016 [place] Anesthesia, Adult, Care After Refer to this sheet in the next few weeks. These instructions provide you with information on caring for yourself after  your procedure. Your health care provider may also give you more specific instructions. Your treatment has been planned according to current medical practices, but problems sometimes occur. Call your health care provider if you have any problems or questions after your procedure. WHAT TO EXPECT AFTER THE PROCEDURE After the procedure, it is typical to experience:  Sleepiness.  Nausea and vomiting. HOME CARE INSTRUCTIONS  For the first 24 hours after general anesthesia:  Have a responsible person with you.  Do not drive a car. If you are alone, do not take public transportation.  Do not drink alcohol.  Do not take medicine that has not been prescribed by your health care provider.  Do not sign important papers or make important decisions.  You may resume a normal diet and activities as directed by your health care provider.  Change bandages (dressings) as directed.  If you have questions or problems that seem related to general anesthesia, call the hospital and ask for the anesthetist or anesthesiologist on call. SEEK MEDICAL CARE IF:  You have nausea and vomiting that continue the day after anesthesia.  You develop a rash. SEEK IMMEDIATE MEDICAL CARE IF:   You have difficulty breathing.  You have chest pain.  You have any allergic problems.   This information is not intended to replace advice given to you by your health care provider. Make sure you discuss any questions you have with your health care provider.   Document Released: 09/29/2000 Document Revised: 07/14/2014 Document Reviewed: 10/22/2011 Elsevier Interactive Patient Education 2016 [employer] PATIENT INSTRUCTIONS POST-ANESTHESIA  IMMEDIATELY FOLLOWING SURGERY:  Do not drive or operate machinery for the first twenty four hours after surgery.  Do not make any important decisions for twenty four hours after surgery or while taking narcotic pain medications or sedatives.  If you develop intractable nausea and  vomiting or a severe headache please notify your doctor immediately.  FOLLOW-UP:  Please make an appointment with your surgeon as instructed. You do not need to follow up with anesthesia unless specifically instructed to do so.  WOUND CARE INSTRUCTIONS (if applicable):  Keep a dry clean dressing on the anesthesia/puncture wound site if there is drainage.  Once the wound has quit draining  you may leave it open to air.  Generally you should leave the bandage intact for twenty four hours unless there is drainage.  If the epidural site drains for more than 36-48 hours please call the anesthesia department.  QUESTIONS?:  Please feel free to call your physician or the hospital operator if you have any questions, and they will be happy to assist you.

## 2016-04-23 ENCOUNTER — Encounter (HOSPITAL_COMMUNITY)
Admission: RE | Admit: 2016-04-23 | Discharge: 2016-04-23 | Disposition: A | Discharge status: Home / Self Care | Payer: Medicaid Other | Source: Ambulatory Visit | Attending: Obstetrics and Gynecology | Admitting: Obstetrics and Gynecology

## 2016-04-23 ENCOUNTER — Encounter (HOSPITAL_COMMUNITY): Payer: Self-pay

## 2016-04-23 ENCOUNTER — Other Ambulatory Visit: Payer: Self-pay

## 2016-04-23 DIAGNOSIS — Z01810 Encounter for preprocedural cardiovascular examination: Secondary | ICD-10-CM | POA: Insufficient documentation

## 2016-04-23 DIAGNOSIS — R9431 Abnormal electrocardiogram [ECG] [EKG]: Secondary | ICD-10-CM | POA: Insufficient documentation

## 2016-04-23 DIAGNOSIS — Z01818 Encounter for other preprocedural examination: Secondary | ICD-10-CM | POA: Diagnosis present

## 2016-04-23 LAB — COMPREHENSIVE METABOLIC PANEL
ALK PHOS: 102 U/L (ref 38–126)
ALT: 17 U/L (ref 14–54)
AST: 18 U/L (ref 15–41)
Albumin: 4.1 g/dL (ref 3.5–5.0)
Anion gap: 8 (ref 5–15)
BILIRUBIN TOTAL: 0.3 mg/dL (ref 0.3–1.2)
BUN: 11 mg/dL (ref 6–20)
CALCIUM: 8.9 mg/dL (ref 8.9–10.3)
CO2: 25 mmol/L (ref 22–32)
Chloride: 103 mmol/L (ref 101–111)
Creatinine, Ser: 0.92 mg/dL (ref 0.44–1.00)
GFR calc Af Amer: >60 mL/min (ref >60)
GFR calc non Af Amer: >60 mL/min (ref >60)
GLUCOSE: 115 mg/dL — AB (ref 65–99)
Potassium: 2.6 mmol/L — CL (ref 3.5–5.1)
Sodium: 136 mmol/L (ref 135–145)
TOTAL PROTEIN: 7.7 g/dL (ref 6.5–8.1)

## 2016-04-23 LAB — URINE MICROSCOPIC-ADD ON

## 2016-04-23 LAB — URINALYSIS, ROUTINE W REFLEX MICROSCOPIC
BILIRUBIN URINE: NEGATIVE
GLUCOSE, UA: NEGATIVE mg/dL
KETONES UR: NEGATIVE mg/dL
LEUKOCYTES UA: NEGATIVE
Nitrite: NEGATIVE
PH: 6 (ref 5.0–8.0)
Protein, ur: NEGATIVE mg/dL
Specific Gravity, Urine: >1.03 — ABNORMAL HIGH (ref 1.005–1.030)

## 2016-04-23 LAB — CBC
HCT: 38 % (ref 36.0–46.0)
Hemoglobin: 12.4 g/dL (ref 12.0–15.0)
MCH: 22.7 pg — AB (ref 26.0–34.0)
MCHC: 32.6 g/dL (ref 30.0–36.0)
MCV: 69.6 fL — ABNORMAL LOW (ref 78.0–100.0)
Platelets: 312 10*3/uL (ref 150–400)
RBC: 5.46 MIL/uL — AB (ref 3.87–5.11)
RDW: 15.3 % (ref 11.5–15.5)
WBC: 9.8 10*3/uL (ref 4.0–10.5)

## 2016-04-23 LAB — HCG, SERUM, QUALITATIVE: Preg, Serum: NEGATIVE

## 2016-04-23 NOTE — Pre-Procedure Instructions (Signed)
Lab calls critical K+ of 2.6. Dr Glo Herring called and he will call patient and discuss plan of treatment.

## 2016-04-24 ENCOUNTER — Telehealth: Payer: Self-pay | Admitting: Obstetrics and Gynecology

## 2016-04-24 NOTE — Pre-Procedure Instructions (Signed)
Dr Marcie Bal notified of potassium and conversation with Dr Glo Herring. Istat ordered on arrival morning of surgery.

## 2016-04-24 NOTE — Telephone Encounter (Signed)
Pt advised of low K and need to take her TID supplement today and tomorrow.

## 2016-04-25 ENCOUNTER — Encounter (HOSPITAL_COMMUNITY): Payer: Self-pay | Admitting: Anesthesiology

## 2016-04-25 ENCOUNTER — Ambulatory Visit (HOSPITAL_COMMUNITY)
Admission: RE | Admit: 2016-04-25 | Discharge: 2016-04-25 | Disposition: A | Discharge status: Home / Self Care | Payer: Medicaid Other | Source: Ambulatory Visit | Attending: Obstetrics and Gynecology | Admitting: Obstetrics and Gynecology

## 2016-04-25 ENCOUNTER — Other Ambulatory Visit: Payer: Self-pay | Admitting: Family Medicine

## 2016-04-25 ENCOUNTER — Telehealth: Payer: Self-pay | Admitting: Family Medicine

## 2016-04-25 ENCOUNTER — Telehealth: Payer: Self-pay | Admitting: Obstetrics and Gynecology

## 2016-04-25 ENCOUNTER — Encounter (HOSPITAL_COMMUNITY)
Admission: RE | Disposition: A | Discharge status: Home / Self Care | Payer: Self-pay | Source: Ambulatory Visit | Attending: Obstetrics and Gynecology

## 2016-04-25 ENCOUNTER — Other Ambulatory Visit: Payer: Self-pay

## 2016-04-25 DIAGNOSIS — Z793 Long term (current) use of hormonal contraceptives: Secondary | ICD-10-CM | POA: Diagnosis not present

## 2016-04-25 DIAGNOSIS — Z79899 Other long term (current) drug therapy: Secondary | ICD-10-CM | POA: Diagnosis not present

## 2016-04-25 DIAGNOSIS — Z1231 Encounter for screening mammogram for malignant neoplasm of breast: Secondary | ICD-10-CM

## 2016-04-25 DIAGNOSIS — I10 Essential (primary) hypertension: Secondary | ICD-10-CM | POA: Diagnosis not present

## 2016-04-25 DIAGNOSIS — Z5309 Procedure and treatment not carried out because of other contraindication: Secondary | ICD-10-CM | POA: Diagnosis not present

## 2016-04-25 DIAGNOSIS — N920 Excessive and frequent menstruation with regular cycle: Secondary | ICD-10-CM | POA: Diagnosis present

## 2016-04-25 DIAGNOSIS — E041 Nontoxic single thyroid nodule: Secondary | ICD-10-CM

## 2016-04-25 DIAGNOSIS — E876 Hypokalemia: Secondary | ICD-10-CM

## 2016-04-25 LAB — POCT I-STAT 4, (NA,K, GLUC, HGB,HCT)
GLUCOSE: 95 mg/dL (ref 65–99)
HEMATOCRIT: 38 % (ref 36.0–46.0)
HEMOGLOBIN: 12.9 g/dL (ref 12.0–15.0)
Potassium: 2.7 mmol/L — CL (ref 3.5–5.1)
Sodium: 142 mmol/L (ref 135–145)

## 2016-04-25 SURGERY — CANCELLED PROCEDURE

## 2016-04-25 MED ORDER — POTASSIUM CHLORIDE 20 MEQ PO PACK: 40.0000 meq | PACK | Freq: Three times a day (TID) | ORAL | 0 refills | Status: DC

## 2016-04-25 MED ORDER — LACTATED RINGERS IV SOLN: INTRAVENOUS | Status: DC

## 2016-04-25 SURGICAL SUPPLY — 43 items
ABLATOR ENDOMETRIAL BIPOLAR (ABLATOR) ×4 IMPLANT
BAG HAMPER (MISCELLANEOUS) ×4 IMPLANT
BANDAGE STRIP 1X3 FLEXIBLE (GAUZE/BANDAGES/DRESSINGS) ×12 IMPLANT
BLADE SURG SZ11 CARB STEEL (BLADE) ×4 IMPLANT
CATH ROBINSON RED A/P 16FR (CATHETERS) ×4 IMPLANT
CLOSURE STERI-STRIP 1/4X4 (GAUZE/BANDAGES/DRESSINGS) ×4 IMPLANT
CLOTH BEACON ORANGE TIMEOUT ST (SAFETY) ×4 IMPLANT
COVER LIGHT HANDLE STERIS (MISCELLANEOUS) ×8 IMPLANT
DECANTER SPIKE VIAL GLASS SM (MISCELLANEOUS) ×4 IMPLANT
DURAPREP 26ML APPLICATOR (WOUND CARE) ×4 IMPLANT
ELECT REM PT RETURN 9FT ADLT (ELECTROSURGICAL) ×3
ELECTRODE REM PT RTRN 9FT ADLT (ELECTROSURGICAL) ×3 IMPLANT
FORMALIN 10 PREFIL 120ML (MISCELLANEOUS) ×4 IMPLANT
GLOVE BIOGEL PI IND STRL 7.0 (GLOVE) ×3 IMPLANT
GLOVE BIOGEL PI IND STRL 9 (GLOVE) ×3 IMPLANT
GLOVE BIOGEL PI INDICATOR 7.0 (GLOVE) ×1
GLOVE BIOGEL PI INDICATOR 9 (GLOVE) ×1
GLOVE ECLIPSE 9.0 STRL (GLOVE) ×8 IMPLANT
GOWN SPEC L3 XXLG W/TWL (GOWN DISPOSABLE) ×4 IMPLANT
GOWN STRL REUS W/TWL LRG LVL3 (GOWN DISPOSABLE) ×4 IMPLANT
INST SET HYSTEROSCOPY (KITS) ×4 IMPLANT
INST SET LAPROSCOPIC GYN AP (KITS) ×4 IMPLANT
IV NS IRRIG 3000ML ARTHROMATIC (IV SOLUTION) ×4 IMPLANT
KIT ROOM TURNOVER AP CYSTO (KITS) ×4 IMPLANT
KIT ROOM TURNOVER APOR (KITS) ×4 IMPLANT
MANIFOLD NEPTUNE II (INSTRUMENTS) ×4 IMPLANT
NEEDLE INSUFFLATION 120MM (ENDOMECHANICALS) ×4 IMPLANT
NS IRRIG 1000ML POUR BTL (IV SOLUTION) ×4 IMPLANT
PACK PERI GYN (CUSTOM PROCEDURE TRAY) ×4 IMPLANT
PAD ARMBOARD 7.5X6 YLW CONV (MISCELLANEOUS) ×4 IMPLANT
PAD TELFA 3X4 1S STER (GAUZE/BANDAGES/DRESSINGS) ×4 IMPLANT
SET BASIN LINEN APH (SET/KITS/TRAYS/PACK) ×4 IMPLANT
SET IRRIG Y TYPE TUR BLADDER L (SET/KITS/TRAYS/PACK) ×4 IMPLANT
SHEARS HARMONIC ACE PLUS 36CM (ENDOMECHANICALS) ×4 IMPLANT
SLEEVE ENDOPATH XCEL 5M (ENDOMECHANICALS) ×8 IMPLANT
SOLUTION ANTI FOG 6CC (MISCELLANEOUS) ×4 IMPLANT
SUT VIC AB 4-0 PS2 27 (SUTURE) ×4 IMPLANT
SYR 30ML LL (SYRINGE) ×4 IMPLANT
SYR CONTROL 10ML LL (SYRINGE) ×4 IMPLANT
SYRINGE 10CC LL (SYRINGE) ×4 IMPLANT
TROCAR XCEL NON-BLD 5MMX100MML (ENDOMECHANICALS) ×4 IMPLANT
TUBING INSUFFLATION (TUBING) ×4 IMPLANT
WARMER LAPAROSCOPE (MISCELLANEOUS) ×4 IMPLANT

## 2016-04-25 NOTE — Telephone Encounter (Signed)
klor con prescribed as 40 meq tablet, pls see note to pharmacy, call in script also and notify pt. She needs rept potassium sta on 04/28/2016 in the morning before mid day I will also enter a 12meq dose 3 times daily once the stat report is obtained ?? pls ask

## 2016-04-25 NOTE — Telephone Encounter (Signed)
Per Dr.Ferguson pt to continue to take the Potassium as prescribed, Potassium levels checked next Friday, depending on results may need to change fluid pill. Pt verbalized understanding.

## 2016-04-25 NOTE — Telephone Encounter (Signed)
Lynnett is calling to make Dr. Moshe Cipro aware that her surgery was called off this morning by Dr.Ferguson due to her Potassium being critically low. Dr. Maretta Bees office told patient to contact our office asap, please advise?

## 2016-04-25 NOTE — Telephone Encounter (Signed)
pls see message sent and script entered

## 2016-04-25 NOTE — Telephone Encounter (Signed)
Patient aware and will get med today and go have stat lab Monday am and await results

## 2016-04-25 NOTE — Telephone Encounter (Signed)
Dr Moshe Cipro contacted by phone at 9:50 am and made aware.  Patient states that she has been taking 3 potassium daily but [facility] states her last fill was May 2017 for a 30 day supply. Please advise

## 2016-04-25 NOTE — Anesthesia Preprocedure Evaluation (Deleted)
Anesthesia Evaluation  Patient identified by MRN, date of birth, ID band Patient awake    Reviewed: Allergy & Precautions, H&P , NPO status , Patient's Chart, lab work & pertinent test results  Airway Mallampati: II   Neck ROM: full    Dental   Pulmonary neg pulmonary ROS,    breath sounds clear to auscultation       Cardiovascular hypertension,  Rhythm:regular Rate:Normal     Neuro/Psych    GI/Hepatic   Endo/Other    Renal/GU      Musculoskeletal   Abdominal   Peds  Hematology   Anesthesia Other Findings   Reproductive/Obstetrics                             Anesthesia Physical Anesthesia Plan  ASA: II  Anesthesia Plan: General   Post-op Pain Management:    Induction: Intravenous  Airway Management Planned: Oral ETT  Additional Equipment:   Intra-op Plan:   Post-operative Plan: Extubation in OR  Informed Consent: I have reviewed the patients History and Physical, chart, labs and discussed the procedure including the risks, benefits and alternatives for the proposed anesthesia with the patient or authorized representative who has indicated his/her understanding and acceptance.     Plan Discussed with: CRNA, Anesthesiologist and Surgeon  Anesthesia Plan Comments:         Anesthesia Quick Evaluation

## 2016-04-25 NOTE — Pre-Procedure Instructions (Signed)
K + by ISTAT was 2.7.  Patient took extra potassium last night and this am, as directed by Dr Glo Herring last night.  Dr Glo Herring made aware and has cancelled surgery.  Advised patient to follow up with him today to make future plan.  Verbalized understanding.

## 2016-04-26 NOTE — H&P (Signed)
Preoperative History and Physical  [name] is a 45 y.o. [id number] here for surgical management of menorrhagia status post uterine polyp. No significant preoperative concerns.  Proposed surgery: bilateral salpingectomy and hysteroscopy, dilation and curettage, with endometrial ablation. Polyp identified on ultrasound will be removed at the same time      Past Medical History:  Diagnosis Date  . Back pain   . BV (bacterial vaginosis) 09/16/2013  . Chest pain    right   . Contraceptive management 09/16/2013  . 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 Enlarged thyroid   . HSV-2 (herpes simplex virus 2) infection   . Hypertension   . Irregular intermenstrual bleeding 04/27/2015  . Obesity   . Preterm labor   . Sleep disorder   . Thyroid nodule   . Urinary frequency 12/14/2014  . Vaginal discharge 09/16/2013        Past Surgical History:  Procedure Laterality Date  . BREAST BIOPSY    . btl  1994  . RETAINED PLACENTA REMOVAL    . tubal ligation reversal  2001  . WISDOM TOOTH EXTRACTION                     OB History  Gravida Para Term Preterm AB Living  6 5 2 3 1 5   SAB TAB Ectopic Multiple Live Births  0 1 0 0      # Outcome Date GA Lbr Len/2nd Weight Sex Delivery Anes PTL Lv  6 TAB           5 Preterm           4 Preterm           3 Preterm           2 Term           1 Term             Patient denies any other pertinent gynecologic issues.         Current Outpatient Prescriptions on File Prior to Visit  Medication Sig Dispense Refill  . DILTIAZEM CD 120 MG 24 hr capsule TAKE 1 CAPSULE BY MOUTH EVERY DAY 30 capsule 3  . EPINEPHrine 0.3 mg/0.3 mL IJ SOAJ injection Inject 0.3 mLs (0.3 mg total) into the muscle once. 1 Device 1  . hydrochlorothiazide (HYDRODIURIL) 25 MG tablet TAKE 1 TABLET (25 MG TOTAL) BY MOUTH DAILY. 30 tablet 4  . MedroxyPROGESTERone Acetate 150 MG/ML SUSY INJECT 1ML INTO MUSCLE  EVERY 3 MONTHS  4  . metoprolol tartrate (LOPRESSOR) 25 MG tablet TAKE 1 TABLET EVERY MORNING AND 1/2 TABLET IN THE EVENING 45 tablet 4  . potassium chloride SA (KLOR-CON M20) 20 MEQ tablet TAKE 1 TABLET BY MOUTH 3 TIMES DAILY. 270 tablet 5   No current facility-administered medications on file prior to visit.         Allergies  Allergen Reactions  . Biaxin [Clarithromycin] Diarrhea, Nausea And Vomiting and Other (See Comments)    Blood in stool  . Yellow Jacket Venom [Bee Venom] Swelling    Social History:   reports that she has never smoked. She has never used smokeless tobacco. She reports that she does not drink alcohol or use drugs.        Family History  Problem Relation Age of Onset  . Anuerysm Mother   . Stroke Mother   . Cancer Father     thyroid   . Hypertension Father   . Cancer Paternal  Grandmother     breast  . Seizures Maternal Grandmother     Review of Systems: Noncontributory Light menses since depo provera, heavy prevously. Please see noted by JAGriffin this year. No respiratiory or cardiac symptoms. No anesthesia complications. PHYSICAL EXAM: Blood pressure 110/72, pulse (!) 58, height 5\' 6"  (1.676 m), weight 166 lb 6.4 oz (75.5 kg), last menstrual period 04/07/2016. General appearance - alert, well appearing, and in no distress Chest - clear to auscultation, no wheezes, rales or rhonchi, symmetric air entry Heart - normal rate and regular rhythm, heart rate 60-70 Abdomen - soft, nontender, nondistended, no masses or organomegaly Pelvic exam: VULVA: normal appearing vulva with no masses, tenderness or lesions,  VAGINA: normal appearing vagina with normal color and discharge, no lesions,  CERVIX: ? S/p leep, is everted, normal secretions UTERUS: uterus is normal size, shape, consistency and nontender,  ADNEXA: normal adnexa in size, nontender and no masses.  Extremities - peripheral pulses normal, no pedal edema, no clubbing or  cyanosis  Labs:      Results for orders placed or performed in visit on 04/09/16 (from the past 336 hour(s))  CBC   Collection Time: 04/09/16  4:57 PM  Result Value Ref Range   WBC 10.1 3.4 - 10.8 x10E3/uL   RBC 5.45 (H) 3.77 - 5.28 x10E6/uL   Hemoglobin 12.5 11.1 - 15.9 g/dL   Hematocrit 36.8 34.0 - 46.6 %   MCV 68 (L) 79 - 97 fL   MCH 22.9 (L) 26.6 - 33.0 pg   MCHC 34.0 31.5 - 35.7 g/dL   RDW 16.4 (H) 12.3 - 15.4 %   Platelets 331 150 - 379 x10E3/uL  Beta HCG, Quant   Collection Time: 04/09/16  4:57 PM  Result Value Ref Range   hCG Quant <1 mIU/mL  Results for orders placed or performed in visit on 04/09/16 (from the past 336 hour(s))  POCT urine pregnancy   Collection Time: 04/09/16  4:43 PM  Result Value Ref Range   Preg Test, Ur Negative Negative    Imaging Studies:  ImagingResults  US Transvaginal Non-ob  Result Date: 04/13/2016 GYNECOLOGIC SONOGRAM CLOYE LIONS is a 45 y.o. [id number] LMP  for a pelvic sonogram for menorrhagia. Uterus                      6.3 x 3.5 x 4.9 cm, heterogeneous anteverted uterus,fundal right intramural fibroid .7 x .5 x .6 cm,mult. posterior myometrial                                                       calcification Endometrium          6.3 mm, asymmetrical, endometrium contains a 1.1 x .6 x 1 cm hypoechoic mass w/ a single stalk of color flow. Right ovary             3.3 x 1.5 x 2.6 cm, wnl Left ovary                3 x 2.5 x 2.5 cm, wnl No free fluid seen. Technician Comments: PELVIC US TA/TV: heterogeneous anteverted uterus,fundal right intramural fibroid .7 x.5 x .6 cm,mult posterior myometrial calcification,EEC 6.3 mm,endometrium contains a 1.1 x .6 x 1 cm hypoechoic mass w/ a single stalk of color flow,normal ov's bilat(mobile),no free  fluid,no pain during ultrasound     [employer] 04/10/2016 1:12 PM Clinical Impression and recommendations: I have reviewed the sonogram results above, combined with the patient's current  clinical course, below are my impressions and any appropriate recommendations for management based on the sonographic findings. Uterus  with small myoma, clinically insignificant Endometrium with small polyp Normal ovaries [name],[name] 04/13/2016 5:23 PM Florian Buff, MD 04/13/2016 5:17 PM   US Pelvis Complete  Result Date: 04/13/2016 GYNECOLOGIC SONOGRAM MADICYN SECREASE is a 45 y.o. [id number] LMP  for a pelvic sonogram for menorrhagia. Uterus                      6.3 x 3.5 x 4.9 cm, heterogeneous anteverted uterus,fundal right intramural fibroid .7 x .5 x .6 cm,mult. posterior myometrial                                                       calcification Endometrium          6.3 mm, asymmetrical, endometrium contains a 1.1 x .6 x 1 cm hypoechoic mass w/ a single stalk of color flow. Right ovary             3.3 x 1.5 x 2.6 cm, wnl Left ovary                3 x 2.5 x 2.5 cm, wnl No free fluid seen. Technician Comments: PELVIC US TA/TV: heterogeneous anteverted uterus,fundal right intramural fibroid .7 x.5 x .6 cm,mult posterior myometrial calcification,EEC 6.3 mm,endometrium contains a 1.1 x .6 x 1 cm hypoechoic mass w/ a single stalk of color flow,normal ov's bilat(mobile),no free fluid,no pain during ultrasound     [employer] 04/10/2016 1:12 PM Clinical Impression and recommendations: I have reviewed the sonogram results above, combined with the patient's current clinical course, below are my impressions and any appropriate recommendations for management based on the sonographic findings. Uterus  with small myoma, clinically insignificant Endometrium with small polyp Normal ovaries [name],[name] 04/13/2016 5:23 PM Florian Buff, MD 04/13/2016 5:17 PM     Assessment: desire for sterilization                       History of menorrhagia, currently on Depo-Provera suppression                       Endometrial polyp      Patient Active Problem List   Diagnosis Date Noted  . Protein in urine 04/02/2016  .  Dyspepsia 12/02/2015  . Multiple allergies 11/28/2015  . Irregular intermenstrual bleeding 04/27/2015  . Prediabetes 08/01/2012  . Overweight (BMI 25.0-29.9) 08/01/2012  . Vitamin D deficiency 07/28/2012  . HYPOKALEMIA 03/14/2008  . THYROID NODULE 10/26/2007  . Essential hypertension 10/26/2007   Lengthy discussion of procedure details, results, risks, and benefits.   Plan: Patient will undergo surgical management with bilateral salpingectomy and hysteroscopy D&C with Novasure endometrial ablation. .   04/21/2016 9:25 AM  By signing my name below, I, Sonum Patel, attest that this documentation has been prepared under the direction and in the presence of Jonnie Kind, MD. Electronically Signed: Sonum Patel, Education administrator. 04/21/16. 9:25 AM.  I personally performed the services described in this documentation, which was SCRIBED in  my presence. The recorded information has been reviewed and considered accurate. It has been edited as necessary during review. Jonnie Kind, MD

## 2016-04-28 ENCOUNTER — Other Ambulatory Visit: Payer: Self-pay | Admitting: Family Medicine

## 2016-04-28 LAB — POTASSIUM: Potassium: 4.3 mmol/L (ref 3.5–5.3)

## 2016-04-28 NOTE — Addendum Note (Signed)
Addended by: Denman George B on: 04/28/2016 02:09 PM   Modules accepted: Orders

## 2016-04-30 ENCOUNTER — Telehealth: Payer: Self-pay | Admitting: Family Medicine

## 2016-04-30 ENCOUNTER — Other Ambulatory Visit: Payer: Self-pay

## 2016-04-30 MED ORDER — POTASSIUM CHLORIDE CRYS ER 20 MEQ PO TBCR: EXTENDED_RELEASE_TABLET | ORAL | 3 refills | Status: DC

## 2016-04-30 NOTE — Telephone Encounter (Signed)
Please call with lab results so her surgery can be r/s if permitted, Thanks

## 2016-04-30 NOTE — Telephone Encounter (Signed)
Called pt and advised her to check her mychart

## 2016-05-02 ENCOUNTER — Other Ambulatory Visit: Payer: Self-pay

## 2016-05-02 NOTE — Patient Instructions (Signed)
[name]  05/02/2016     @PREFPERIOPPHARMACY @   Your procedure is scheduled on 05/06/2016.  Report to [facility] at 6:15 A.M.  Call this number if you have problems the morning of surgery:  [phone]   Remember:  Do not eat food or drink liquids after midnight.  Take these medicines the morning of surgery with A SIP OF WATER : Lopressor and Dilitazem   Do not wear jewelry, make-up or nail polish.  Do not wear lotions, powders, or perfumes, or deoderant.  Do not shave 48 hours prior to surgery.  Men may shave face and neck.  Do not bring valuables to the hospital.  [facility] is not responsible for any belongings or valuables.  Contacts, dentures or bridgework may not be worn into surgery.  Leave your suitcase in the car.  After surgery it may be brought to your room.  For patients admitted to the hospital, discharge time will be determined by your treatment team.  Patients discharged the day of surgery will not be allowed to drive home.   Name and phone number of your driver:   family Special instructions:  n/a  Please read over the following fact sheets that you were given. Care and Recovery After Surgery   Salpingectomy Salpingectomy, also called tubectomy, is the surgical removal of one of the fallopian tubes. The fallopian tubes are tubes that are connected to the uterus. These tubes transport the egg from the ovary to the uterus. A salpingectomy may be done for various reasons, including:   A tubal (ectopic) pregnancy. This is especially true if the tube ruptures.  An infected fallopian tube.  The need to remove the fallopian tube when removing an ovary with a cyst or tumor.  The need to remove the fallopian tube when removing the uterus.  Cancer of the fallopian tube or nearby organs. Removing one fallopian tube does not prevent you from becoming pregnant. It also does not cause problems with your menstrual periods.  LET [facility] CARE PROVIDER KNOW  ABOUT:  Any allergies you have.  All medicines you are taking, including vitamins, herbs, eye drops, creams, and over-the-counter medicines.  Previous problems you or members of your family have had with the use of anesthetics.  Any blood disorders you have.  Previous surgeries you have had.  Medical conditions you have. RISKS AND COMPLICATIONS  Generally, this is a safe procedure. However, as with any procedure, complications can occur. Possible complications include:  Injury to surrounding organs.  Bleeding.  Infection.  Problems related to anesthesia. BEFORE THE PROCEDURE  Ask your health care provider about changing or stopping your regular medicines. You may need to stop taking certain medicines, such as aspirin or blood thinners, at least 1 week before the surgery.  Do not eat or drink anything for at least 8 hours before the surgery.  If you smoke, do not smoke for at least 2 weeks before the surgery.  Make plans to have someone drive you home after the procedure or after your hospital stay. Also arrange for someone to help you with activities during recovery. PROCEDURE   You will be given medicine to help you relax before the procedure (sedative). You will then be given medicine to make you sleep through the procedure (general anesthetic). These medicines will be given through an IV access tube that is put into one of your veins.  Once you are asleep, your lower abdomen will be shaved and cleaned. A thin,  flexible tube (catheter) will be placed in your bladder.  The surgeon may use a laparoscopic, robotic, or open technique for this surgery:  In the laparoscopic technique, the surgery is done through two small cuts (incisions) in the abdomen. A thin, lighted tube with a tiny camera on the end (laparoscope) is inserted into one of the incisions. The tools needed for the procedure are put through the other incision.  A robotic technique may be chosen to perform complex  surgery in a small space. In the robotic technique, small incisions will be made. A camera and surgical instruments are passed through the incisions. Surgical instruments will be controlled with the help of a robotic arm.  In the open technique, the surgery is done through one large incision in the abdomen.  Using any of these techniques, the surgeon removes the fallopian tube from where it attaches to the uterus. The blood vessels will be clamped and tied.  The surgeon then uses staples or stitches to close the incision or incisions. AFTER THE PROCEDURE   You will be taken to a recovery area where your progress will be monitored for 1-3 hours.  If the laparoscopic technique was used, you may be allowed to go home after several hours. You may have some shoulder pain after the laparoscopic procedure. This is normal and usually goes away in a day or two.  If the open technique was used, you will be admitted to the hospital for a couple of days.  You will be given pain medicine if needed.  The IV access tube and catheter will be removed before you are discharged.   This information is not intended to replace advice given to you by your health care provider. Make sure you discuss any questions you have with your health care provider.   Document Released: 11/09/2008 Document Revised: 07/14/2014 Document Reviewed: 12/15/2012 Elsevier Interactive Patient Education [employer].

## 2016-05-05 ENCOUNTER — Encounter (HOSPITAL_COMMUNITY)
Admission: RE | Admit: 2016-05-05 | Discharge: 2016-05-05 | Disposition: A | Discharge status: Home / Self Care | Payer: Medicaid Other | Source: Ambulatory Visit | Attending: Obstetrics and Gynecology | Admitting: Obstetrics and Gynecology

## 2016-05-05 ENCOUNTER — Other Ambulatory Visit: Payer: Self-pay | Admitting: Obstetrics and Gynecology

## 2016-05-05 ENCOUNTER — Encounter (HOSPITAL_COMMUNITY): Payer: Self-pay

## 2016-05-05 DIAGNOSIS — N920 Excessive and frequent menstruation with regular cycle: Secondary | ICD-10-CM | POA: Insufficient documentation

## 2016-05-05 DIAGNOSIS — N840 Polyp of corpus uteri: Secondary | ICD-10-CM | POA: Insufficient documentation

## 2016-05-05 LAB — CBC
HEMATOCRIT: 40.3 % (ref 36.0–46.0)
HEMOGLOBIN: 13.1 g/dL (ref 12.0–15.0)
MCH: 22.8 pg — AB (ref 26.0–34.0)
MCHC: 32.5 g/dL (ref 30.0–36.0)
MCV: 70.2 fL — ABNORMAL LOW (ref 78.0–100.0)
Platelets: 307 10*3/uL (ref 150–400)
RBC: 5.74 MIL/uL — ABNORMAL HIGH (ref 3.87–5.11)
RDW: 15.5 % (ref 11.5–15.5)
WBC: 9.7 10*3/uL (ref 4.0–10.5)

## 2016-05-05 LAB — BASIC METABOLIC PANEL
ANION GAP: 7 (ref 5–15)
BUN: 11 mg/dL (ref 6–20)
CO2: 26 mmol/L (ref 22–32)
Calcium: 9.3 mg/dL (ref 8.9–10.3)
Chloride: 105 mmol/L (ref 101–111)
Creatinine, Ser: 1.06 mg/dL — ABNORMAL HIGH (ref 0.44–1.00)
GFR calc Af Amer: >60 mL/min (ref >60)
GFR calc non Af Amer: >60 mL/min (ref >60)
GLUCOSE: 103 mg/dL — AB (ref 65–99)
POTASSIUM: 3.4 mmol/L — AB (ref 3.5–5.1)
Sodium: 138 mmol/L (ref 135–145)

## 2016-05-05 LAB — HCG, SERUM, QUALITATIVE: Preg, Serum: NEGATIVE

## 2016-05-06 ENCOUNTER — Encounter (HOSPITAL_COMMUNITY)
Admission: RE | Disposition: A | Discharge status: Home / Self Care | Payer: Self-pay | Source: Ambulatory Visit | Attending: Obstetrics and Gynecology

## 2016-05-06 ENCOUNTER — Encounter (HOSPITAL_COMMUNITY): Payer: Self-pay | Admitting: *Deleted

## 2016-05-06 ENCOUNTER — Ambulatory Visit (HOSPITAL_COMMUNITY): Payer: Medicaid Other | Admitting: Anesthesiology

## 2016-05-06 ENCOUNTER — Ambulatory Visit (HOSPITAL_COMMUNITY)
Admission: RE | Admit: 2016-05-06 | Discharge: 2016-05-06 | Disposition: A | Discharge status: Home / Self Care | Payer: Medicaid Other | Source: Ambulatory Visit | Attending: Obstetrics and Gynecology | Admitting: Obstetrics and Gynecology

## 2016-05-06 DIAGNOSIS — Z91030 Bee allergy status: Secondary | ICD-10-CM | POA: Diagnosis not present

## 2016-05-06 DIAGNOSIS — Z808 Family history of malignant neoplasm of other organs or systems: Secondary | ICD-10-CM | POA: Insufficient documentation

## 2016-05-06 DIAGNOSIS — N920 Excessive and frequent menstruation with regular cycle: Secondary | ICD-10-CM

## 2016-05-06 DIAGNOSIS — Z823 Family history of stroke: Secondary | ICD-10-CM | POA: Diagnosis not present

## 2016-05-06 DIAGNOSIS — I10 Essential (primary) hypertension: Secondary | ICD-10-CM | POA: Diagnosis not present

## 2016-05-06 DIAGNOSIS — Z302 Encounter for sterilization: Secondary | ICD-10-CM | POA: Diagnosis not present

## 2016-05-06 DIAGNOSIS — Z803 Family history of malignant neoplasm of breast: Secondary | ICD-10-CM | POA: Insufficient documentation

## 2016-05-06 DIAGNOSIS — Z888 Allergy status to other drugs, medicaments and biological substances status: Secondary | ICD-10-CM | POA: Insufficient documentation

## 2016-05-06 DIAGNOSIS — N840 Polyp of corpus uteri: Secondary | ICD-10-CM | POA: Insufficient documentation

## 2016-05-06 DIAGNOSIS — Z8742 Personal history of other diseases of the female genital tract: Secondary | ICD-10-CM

## 2016-05-06 DIAGNOSIS — Z09 Encounter for follow-up examination after completed treatment for conditions other than malignant neoplasm: Secondary | ICD-10-CM

## 2016-05-06 DIAGNOSIS — Z8249 Family history of ischemic heart disease and other diseases of the circulatory system: Secondary | ICD-10-CM | POA: Insufficient documentation

## 2016-05-06 DIAGNOSIS — E559 Vitamin D deficiency, unspecified: Secondary | ICD-10-CM | POA: Diagnosis not present

## 2016-05-06 HISTORY — PX: LAPAROSCOPIC BILATERAL SALPINGECTOMY: SHX5889

## 2016-05-06 HISTORY — PX: DILITATION & CURRETTAGE/HYSTROSCOPY WITH NOVASURE ABLATION: SHX5568

## 2016-05-06 SURGERY — SALPINGECTOMY, BILATERAL, LAPAROSCOPIC
Anesthesia: General | Site: Uterus

## 2016-05-06 MED ORDER — LIDOCAINE HCL (PF) 1 % IJ SOLN
INTRAMUSCULAR | Status: AC
  Filled 2016-05-06: qty 5

## 2016-05-06 MED ORDER — BUPIVACAINE HCL (PF) 0.5 % IJ SOLN
INTRAMUSCULAR | Status: AC
  Filled 2016-05-06: qty 30

## 2016-05-06 MED ORDER — LACTATED RINGERS IV SOLN
INTRAVENOUS | Status: DC
  Administered 2016-05-06: 1000 mL via INTRAVENOUS

## 2016-05-06 MED ORDER — 0.9 % SODIUM CHLORIDE (POUR BTL) OPTIME
TOPICAL | Status: DC | PRN
  Administered 2016-05-06: 1000 mL

## 2016-05-06 MED ORDER — ONDANSETRON HCL 4 MG/2ML IJ SOLN
4.0000 mg | Freq: Once | INTRAMUSCULAR | Status: AC
  Administered 2016-05-06: 4 mg via INTRAVENOUS

## 2016-05-06 MED ORDER — ROCURONIUM BROMIDE 50 MG/5ML IV SOLN
INTRAVENOUS | Status: AC
  Filled 2016-05-06: qty 1

## 2016-05-06 MED ORDER — ROCURONIUM BROMIDE 100 MG/10ML IV SOLN
INTRAVENOUS | Status: DC | PRN
  Administered 2016-05-06: 5 mg via INTRAVENOUS
  Administered 2016-05-06: 25 mg via INTRAVENOUS

## 2016-05-06 MED ORDER — SCOPOLAMINE 1 MG/3DAYS TD PT72
MEDICATED_PATCH | TRANSDERMAL | Status: AC
  Filled 2016-05-06: qty 1

## 2016-05-06 MED ORDER — PROPOFOL 10 MG/ML IV BOLUS
INTRAVENOUS | Status: DC | PRN
  Administered 2016-05-06: 150 mg via INTRAVENOUS

## 2016-05-06 MED ORDER — MIDAZOLAM HCL 2 MG/2ML IJ SOLN
INTRAMUSCULAR | Status: AC
  Filled 2016-05-06: qty 2

## 2016-05-06 MED ORDER — FENTANYL CITRATE (PF) 250 MCG/5ML IJ SOLN
INTRAMUSCULAR | Status: AC
  Filled 2016-05-06: qty 5

## 2016-05-06 MED ORDER — MIDAZOLAM HCL 2 MG/2ML IJ SOLN
1.0000 mg | INTRAMUSCULAR | Status: DC | PRN
  Administered 2016-05-06 (×2): 2 mg via INTRAVENOUS
  Filled 2016-05-06: qty 2

## 2016-05-06 MED ORDER — PROPOFOL 10 MG/ML IV BOLUS
INTRAVENOUS | Status: AC
  Filled 2016-05-06: qty 20

## 2016-05-06 MED ORDER — ONDANSETRON HCL 4 MG/2ML IJ SOLN
INTRAMUSCULAR | Status: AC
  Filled 2016-05-06: qty 2

## 2016-05-06 MED ORDER — GLYCOPYRROLATE 0.2 MG/ML IJ SOLN
INTRAMUSCULAR | Status: AC
  Filled 2016-05-06: qty 2

## 2016-05-06 MED ORDER — HYDROMORPHONE HCL 1 MG/ML IJ SOLN
0.2500 mg | INTRAMUSCULAR | Status: DC | PRN
  Administered 2016-05-06: 0.5 mg via INTRAVENOUS
  Filled 2016-05-06: qty 0.5

## 2016-05-06 MED ORDER — NEOSTIGMINE METHYLSULFATE 10 MG/10ML IV SOLN
INTRAVENOUS | Status: DC | PRN
  Administered 2016-05-06: 2 mg via INTRAVENOUS

## 2016-05-06 MED ORDER — SODIUM CHLORIDE 0.9 % IR SOLN
Status: DC | PRN
  Administered 2016-05-06: 3000 mL

## 2016-05-06 MED ORDER — ONDANSETRON 4 MG PO TBDP
4.0000 mg | ORAL_TABLET | Freq: Once | ORAL | Status: AC
  Administered 2016-05-06: 4 mg via ORAL

## 2016-05-06 MED ORDER — SCOPOLAMINE 1 MG/3DAYS TD PT72
1.0000 | MEDICATED_PATCH | TRANSDERMAL | Status: DC
  Administered 2016-05-06: 1.5 mg via TRANSDERMAL

## 2016-05-06 MED ORDER — GLYCOPYRROLATE 0.2 MG/ML IJ SOLN
INTRAMUSCULAR | Status: DC | PRN
  Administered 2016-05-06: 0.4 mg via INTRAVENOUS

## 2016-05-06 MED ORDER — HYDROCODONE-ACETAMINOPHEN 5-325 MG PO TABS: 1.0000 | ORAL_TABLET | Freq: Four times a day (QID) | ORAL | 0 refills | Status: DC | PRN

## 2016-05-06 MED ORDER — KETOROLAC TROMETHAMINE 10 MG PO TABS: 10.0000 mg | ORAL_TABLET | Freq: Four times a day (QID) | ORAL | 0 refills | Status: DC | PRN

## 2016-05-06 MED ORDER — ONDANSETRON 4 MG PO TBDP
ORAL_TABLET | ORAL | Status: AC
  Filled 2016-05-06: qty 1

## 2016-05-06 MED ORDER — FENTANYL CITRATE (PF) 100 MCG/2ML IJ SOLN
INTRAMUSCULAR | Status: DC | PRN
  Administered 2016-05-06 (×2): 50 ug via INTRAVENOUS

## 2016-05-06 MED ORDER — EPINEPHRINE PF 1 MG/ML IJ SOLN
INTRAMUSCULAR | Status: AC
  Filled 2016-05-06: qty 1

## 2016-05-06 MED ORDER — NEOSTIGMINE METHYLSULFATE 10 MG/10ML IV SOLN
INTRAVENOUS | Status: AC
  Filled 2016-05-06: qty 1

## 2016-05-06 MED ORDER — LIDOCAINE HCL (CARDIAC) 10 MG/ML IV SOLN
INTRAVENOUS | Status: DC | PRN
  Administered 2016-05-06: 50 mg via INTRAVENOUS

## 2016-05-06 MED ORDER — BUPIVACAINE-EPINEPHRINE (PF) 0.5% -1:200000 IJ SOLN
INTRAMUSCULAR | Status: DC | PRN
  Administered 2016-05-06: 30 mL

## 2016-05-06 SURGICAL SUPPLY — 43 items
ABLATOR ENDOMETRIAL BIPOLAR (ABLATOR) ×3 IMPLANT
BAG HAMPER (MISCELLANEOUS) ×3 IMPLANT
BANDAGE STRIP 1X3 FLEXIBLE (GAUZE/BANDAGES/DRESSINGS) ×7 IMPLANT
BLADE SURG SZ11 CARB STEEL (BLADE) ×3 IMPLANT
CLOSURE STERI-STRIP 1/4X4 (GAUZE/BANDAGES/DRESSINGS) ×3 IMPLANT
CLOTH BEACON ORANGE TIMEOUT ST (SAFETY) ×3 IMPLANT
COVER LIGHT HANDLE STERIS (MISCELLANEOUS) ×6 IMPLANT
DECANTER SPIKE VIAL GLASS SM (MISCELLANEOUS) ×3 IMPLANT
DURAPREP 26ML APPLICATOR (WOUND CARE) ×3 IMPLANT
ELECT REM PT RETURN 9FT ADLT (ELECTROSURGICAL) ×3
ELECTRODE REM PT RTRN 9FT ADLT (ELECTROSURGICAL) ×2 IMPLANT
FORMALIN 10 PREFIL 120ML (MISCELLANEOUS) ×3 IMPLANT
GLOVE BIOGEL PI IND STRL 7.0 (GLOVE) ×2 IMPLANT
GLOVE BIOGEL PI IND STRL 9 (GLOVE) ×2 IMPLANT
GLOVE BIOGEL PI INDICATOR 7.0 (GLOVE) ×1
GLOVE BIOGEL PI INDICATOR 9 (GLOVE) ×1
GLOVE ECLIPSE 9.0 STRL (GLOVE) ×6 IMPLANT
GLOVE EXAM NITRILE MD LF STRL (GLOVE) ×2 IMPLANT
GOWN SPEC L3 XXLG W/TWL (GOWN DISPOSABLE) ×3 IMPLANT
GOWN STRL REUS W/TWL LRG LVL3 (GOWN DISPOSABLE) ×3 IMPLANT
INST SET HYSTEROSCOPY (KITS) ×3 IMPLANT
INST SET LAPROSCOPIC GYN AP (KITS) ×3 IMPLANT
IV NS IRRIG 3000ML ARTHROMATIC (IV SOLUTION) ×3 IMPLANT
KIT ROOM TURNOVER AP CYSTO (KITS) ×3 IMPLANT
KIT ROOM TURNOVER APOR (KITS) ×3 IMPLANT
MANIFOLD NEPTUNE II (INSTRUMENTS) ×3 IMPLANT
NEEDLE INSUFFLATION 120MM (ENDOMECHANICALS) ×3 IMPLANT
NS IRRIG 1000ML POUR BTL (IV SOLUTION) ×3 IMPLANT
PACK PERI GYN (CUSTOM PROCEDURE TRAY) ×3 IMPLANT
PAD ARMBOARD 7.5X6 YLW CONV (MISCELLANEOUS) ×3 IMPLANT
PAD TELFA 3X4 1S STER (GAUZE/BANDAGES/DRESSINGS) ×3 IMPLANT
SET BASIN LINEN APH (SET/KITS/TRAYS/PACK) ×3 IMPLANT
SET IRRIG Y TYPE TUR BLADDER L (SET/KITS/TRAYS/PACK) ×3 IMPLANT
SHEARS HARMONIC ACE PLUS 36CM (ENDOMECHANICALS) ×3 IMPLANT
SLEEVE ENDOPATH XCEL 5M (ENDOMECHANICALS) ×6 IMPLANT
SOLUTION ANTI FOG 6CC (MISCELLANEOUS) ×3 IMPLANT
SUT VIC AB 4-0 PS2 27 (SUTURE) ×3 IMPLANT
SYR 30ML LL (SYRINGE) ×3 IMPLANT
SYR CONTROL 10ML LL (SYRINGE) ×3 IMPLANT
SYRINGE 10CC LL (SYRINGE) ×3 IMPLANT
TROCAR XCEL NON-BLD 5MMX100MML (ENDOMECHANICALS) ×3 IMPLANT
TUBING INSUFFLATION (TUBING) ×3 IMPLANT
WARMER LAPAROSCOPE (MISCELLANEOUS) ×3 IMPLANT

## 2016-05-06 NOTE — Anesthesia Postprocedure Evaluation (Signed)
Anesthesia Post Note  Patient: [name]  Procedure(s) Performed: Procedure(s) (LRB): LAPAROSCOPIC BILATERAL SALPINGECTOMY (Bilateral) DILATATION & CURETTAGE/HYSTEROSCOPY WITH NOVASURE ABLATION (N/A)  Patient location during evaluation: PACU Anesthesia Type: General Level of consciousness: awake Pain management: pain level controlled Vital Signs Assessment: post-procedure vital signs reviewed and stable Respiratory status: spontaneous breathing Cardiovascular status: stable Anesthetic complications: no    Last Vitals:  Vitals:   05/06/16 0730 05/06/16 0853  BP: 128/84 (!) 169/86  Pulse:  71  Resp: 18   Temp:  36.4 C    Last Pain:  Vitals:   05/06/16 0909  TempSrc:   PainSc: 5                  Garyn Arlotta

## 2016-05-06 NOTE — Transfer of Care (Signed)
Immediate Anesthesia Transfer of Care Note  Patient:  Decamp McBee  Procedure(s) Performed: Procedure(s) with comments: LAPAROSCOPIC BILATERAL SALPINGECTOMY (Bilateral) DILATATION & CURETTAGE/HYSTEROSCOPY WITH NOVASURE ABLATION (N/A) - length-5; width-2.6; power-72 ; time- 1 minute 42 seconds  Patient Location: PACU  Anesthesia Type:General  Level of Consciousness: sedated and patient cooperative  Airway & Oxygen Therapy: Patient Spontanous Breathing and non-rebreather face mask  Post-op Assessment: Report given to RN and Post -op Vital signs reviewed and stable  Post vital signs: Reviewed and stable  Last Vitals:  Vitals:   05/06/16 0725 05/06/16 0730  BP: 128/84 128/84  Pulse:    Resp: 16 18  Temp:      Last Pain:  Vitals:   05/06/16 0647  TempSrc: Oral      Patients Stated Pain Goal: 6 ([id number])  Complications: No apparent anesthesia complications

## 2016-05-06 NOTE — H&P (View-Only) (Signed)
Preoperative History and Physical  [name] is a 45 y.o. [id number] here for surgical management of menorrhagia status post uterine polyp. No significant preoperative concerns.  Proposed surgery: bilateral salpingectomy and hysteroscopy, dilation and curettage, with endometrial ablation. Polyp identified on ultrasound will be removed at the same time      Past Medical History:  Diagnosis Date  . Back pain   . BV (bacterial vaginosis) 09/16/2013  . Chest pain    right   . Contraceptive management 09/16/2013  . Encephalocele ([place]) 1994  . Enlarged thyroid   . HSV-2 (herpes simplex virus 2) infection   . Hypertension   . Irregular intermenstrual bleeding 04/27/2015  . Obesity   . Preterm labor   . Sleep disorder   . Thyroid nodule   . Urinary frequency 12/14/2014  . Vaginal discharge 09/16/2013        Past Surgical History:  Procedure Laterality Date  . BREAST BIOPSY    . btl  1994  . RETAINED PLACENTA REMOVAL    . tubal ligation reversal  2001  . WISDOM TOOTH EXTRACTION                     OB History  Gravida Para Term Preterm AB Living  6 5 2 3 1 5   SAB TAB Ectopic Multiple Live Births  0 1 0 0      # Outcome Date GA Lbr Len/2nd Weight Sex Delivery Anes PTL Lv  6 TAB           5 Preterm           4 Preterm           3 Preterm           2 Term           1 Term             Patient denies any other pertinent gynecologic issues.         Current Outpatient Prescriptions on File Prior to Visit  Medication Sig Dispense Refill  . DILTIAZEM CD 120 MG 24 hr capsule TAKE 1 CAPSULE BY MOUTH EVERY DAY 30 capsule 3  . EPINEPHrine 0.3 mg/0.3 mL IJ SOAJ injection Inject 0.3 mLs (0.3 mg total) into the muscle once. 1 Device 1  . hydrochlorothiazide (HYDRODIURIL) 25 MG tablet TAKE 1 TABLET (25 MG TOTAL) BY MOUTH DAILY. 30 tablet 4  . MedroxyPROGESTERone Acetate 150 MG/ML SUSY INJECT 1ML INTO MUSCLE  EVERY 3 MONTHS  4  . metoprolol tartrate (LOPRESSOR) 25 MG tablet TAKE 1 TABLET EVERY MORNING AND 1/2 TABLET IN THE EVENING 45 tablet 4  . potassium chloride SA (KLOR-CON M20) 20 MEQ tablet TAKE 1 TABLET BY MOUTH 3 TIMES DAILY. 270 tablet 5   No current facility-administered medications on file prior to visit.         Allergies  Allergen Reactions  . Biaxin [Clarithromycin] Diarrhea, Nausea And Vomiting and Other (See Comments)    Blood in stool  . Yellow Jacket Venom [Bee Venom] Swelling    Social History:   reports that she has never smoked. She has never used smokeless tobacco. She reports that she does not drink alcohol or use drugs.        Family History  Problem Relation Age of Onset  . Anuerysm Mother   . Stroke Mother   . Cancer Father     thyroid   . Hypertension Father   . Cancer Paternal  Grandmother     breast  . Seizures Maternal Grandmother     Review of Systems: Noncontributory Light menses since depo provera, heavy prevously. Please see noted by JAGriffin this year. No respiratiory or cardiac symptoms. No anesthesia complications. PHYSICAL EXAM: Blood pressure 110/72, pulse (!) 58, height 5\' 6"  (1.676 m), weight 166 lb 6.4 oz (75.5 kg), last menstrual period 04/07/2016. General appearance - alert, well appearing, and in no distress Chest - clear to auscultation, no wheezes, rales or rhonchi, symmetric air entry Heart - normal rate and regular rhythm, heart rate 60-70 Abdomen - soft, nontender, nondistended, no masses or organomegaly Pelvic exam: VULVA: normal appearing vulva with no masses, tenderness or lesions,  VAGINA: normal appearing vagina with normal color and discharge, no lesions,  CERVIX: ? S/p leep, is everted, normal secretions UTERUS: uterus is normal size, shape, consistency and nontender,  ADNEXA: normal adnexa in size, nontender and no masses.  Extremities - peripheral pulses normal, no pedal edema, no clubbing or  cyanosis  Labs:      Results for orders placed or performed in visit on 04/09/16 (from the past 336 hour(s))  CBC   Collection Time: 04/09/16  4:57 PM  Result Value Ref Range   WBC 10.1 3.4 - 10.8 x10E3/uL   RBC 5.45 (H) 3.77 - 5.28 x10E6/uL   Hemoglobin 12.5 11.1 - 15.9 g/dL   Hematocrit 36.8 34.0 - 46.6 %   MCV 68 (L) 79 - 97 fL   MCH 22.9 (L) 26.6 - 33.0 pg   MCHC 34.0 31.5 - 35.7 g/dL   RDW 16.4 (H) 12.3 - 15.4 %   Platelets 331 150 - 379 x10E3/uL  Beta HCG, Quant   Collection Time: 04/09/16  4:57 PM  Result Value Ref Range   hCG Quant <1 mIU/mL  Results for orders placed or performed in visit on 04/09/16 (from the past 336 hour(s))  POCT urine pregnancy   Collection Time: 04/09/16  4:43 PM  Result Value Ref Range   Preg Test, Ur Negative Negative    Imaging Studies:  ImagingResults  US Transvaginal Non-ob  Result Date: 04/13/2016 GYNECOLOGIC SONOGRAM [name] is a 45 y.o. [id number] LMP  for a pelvic sonogram for menorrhagia. Uterus                      6.3 x 3.5 x 4.9 cm, heterogeneous anteverted uterus,fundal right intramural fibroid .7 x .5 x .6 cm,mult. posterior myometrial                                                       calcification Endometrium          6.3 mm, asymmetrical, endometrium contains a 1.1 x .6 x 1 cm hypoechoic mass w/ a single stalk of color flow. Right ovary             3.3 x 1.5 x 2.6 cm, wnl Left ovary                3 x 2.5 x 2.5 cm, wnl No free fluid seen. Technician Comments: PELVIC US TA/TV: heterogeneous anteverted uterus,fundal right intramural fibroid .7 x.5 x .6 cm,mult posterior myometrial calcification,EEC 6.3 mm,endometrium contains a 1.1 x .6 x 1 cm hypoechoic mass w/ a single stalk of color flow,normal ov's bilat(mobile),no free  fluid,no pain during ultrasound     [employer] 04/10/2016 1:12 PM Clinical Impression and recommendations: I have reviewed the sonogram results above, combined with the patient's current  clinical course, below are my impressions and any appropriate recommendations for management based on the sonographic findings. Uterus  with small myoma, clinically insignificant Endometrium with small polyp Normal ovaries [name],[name] 04/13/2016 5:23 PM Florian Buff, MD 04/13/2016 5:17 PM   US Pelvis Complete  Result Date: 04/13/2016 GYNECOLOGIC SONOGRAM KAIRAH BRINKMEYER is a 45 y.o. [id number] LMP  for a pelvic sonogram for menorrhagia. Uterus                      6.3 x 3.5 x 4.9 cm, heterogeneous anteverted uterus,fundal right intramural fibroid .7 x .5 x .6 cm,mult. posterior myometrial                                                       calcification Endometrium          6.3 mm, asymmetrical, endometrium contains a 1.1 x .6 x 1 cm hypoechoic mass w/ a single stalk of color flow. Right ovary             3.3 x 1.5 x 2.6 cm, wnl Left ovary                3 x 2.5 x 2.5 cm, wnl No free fluid seen. Technician Comments: PELVIC US TA/TV: heterogeneous anteverted uterus,fundal right intramural fibroid .7 x.5 x .6 cm,mult posterior myometrial calcification,EEC 6.3 mm,endometrium contains a 1.1 x .6 x 1 cm hypoechoic mass w/ a single stalk of color flow,normal ov's bilat(mobile),no free fluid,no pain during ultrasound     [employer] 04/10/2016 1:12 PM Clinical Impression and recommendations: I have reviewed the sonogram results above, combined with the patient's current clinical course, below are my impressions and any appropriate recommendations for management based on the sonographic findings. Uterus  with small myoma, clinically insignificant Endometrium with small polyp Normal ovaries [name],[name] 04/13/2016 5:23 PM Florian Buff, MD 04/13/2016 5:17 PM     Assessment: desire for sterilization                       History of menorrhagia, currently on Depo-Provera suppression                       Endometrial polyp      Patient Active Problem List   Diagnosis Date Noted  . Protein in urine 04/02/2016  .  Dyspepsia 12/02/2015  . Multiple allergies 11/28/2015  . Irregular intermenstrual bleeding 04/27/2015  . Prediabetes 08/01/2012  . Overweight (BMI 25.0-29.9) 08/01/2012  . Vitamin D deficiency 07/28/2012  . HYPOKALEMIA 03/14/2008  . THYROID NODULE 10/26/2007  . Essential hypertension 10/26/2007   Lengthy discussion of procedure details, results, risks, and benefits.   Plan: Patient will undergo surgical management with bilateral salpingectomy and hysteroscopy D&C with Novasure endometrial ablation. .   04/21/2016 9:25 AM  By signing my name below, I, [name], attest that this documentation has been prepared under the direction and in the presence of Jonnie Kind, MD. Electronically Signed: Sonum Weaver, Education administrator. 04/21/16. 9:25 AM.  I personally performed the services described in this documentation, which was SCRIBED in  my presence. The recorded information has been reviewed and considered accurate. It has been edited as necessary during review. Jonnie Kind, MD

## 2016-05-06 NOTE — Op Note (Signed)
Please see the brief operative note for surgical details 

## 2016-05-06 NOTE — Discharge Instructions (Signed)
Dilation and Curettage or Vacuum Curettage, Care After Refer to this sheet in the next few weeks. These instructions provide you with information on caring for yourself after your procedure. Your health care provider may also give you more specific instructions. Your treatment has been planned according to current medical practices, but problems sometimes occur. Call your health care provider if you have any problems or questions after your procedure. WHAT TO EXPECT AFTER THE PROCEDURE After your procedure, it is typical to have light cramping and bleeding. This may last for 2 days to 2 weeks after the procedure. HOME CARE INSTRUCTIONS   Do not drive for 24 hours.  Wait 1 week before returning to strenuous activities.  Take your temperature 2 times a day for 4 days and write it down. Provide these temperatures to your health care provider if you develop a fever.  Avoid long periods of standing.  Avoid heavy lifting, pushing, or pulling. Do not lift anything heavier than 10 pounds (4.5 kg).  Limit stair climbing to once or twice a day.  Take rest periods often.  You may resume your usual diet.  Drink enough fluids to keep your urine clear or pale yellow.  Your usual bowel function should return. If you have constipation, you may:  Take a mild laxative with permission from your health care provider.  Add fruit and bran to your diet.  Drink more fluids.  Take showers instead of baths until your health care provider gives you permission to take baths.  Do not go swimming or use a hot tub until your health care provider approves.  Try to have someone with you or available to you the first 24-48 hours, especially if you were given a general anesthetic.  Do not douche, use tampons, or have sex (intercourse) for 2 weeks after the procedure.  Only take over-the-counter or prescription medicines as directed by your health care provider. Do not take aspirin. It can cause  bleeding.  Follow up with your health care provider as directed. SEEK MEDICAL CARE IF:   You have increasing cramps or pain that is not relieved with medicine.  You have abdominal pain that does not seem to be related to the same area of earlier cramping and pain.  You have bad smelling vaginal discharge.  You have a rash.  You are having problems with any medicine. SEEK IMMEDIATE MEDICAL CARE IF:   You have bleeding that is heavier than a normal menstrual period.  You have a fever.  You have chest pain.  You have shortness of breath.  You feel dizzy or feel like fainting.  You pass out.  You have pain in your shoulder strap area.  You have heavy vaginal bleeding with or without blood clots. MAKE SURE YOU:   Understand these instructions.  Will watch your condition.  Will get help right away if you are not doing well or get worse.   This information is not intended to replace advice given to you by your health care provider. Make sure you discuss any questions you have with your health care provider.   Document Released: 06/20/2000 Document Revised: 06/28/2013 Document Reviewed: 01/20/2013 Elsevier Interactive Patient Education 2016 [employer], Care After  [place] not lift anything heavier than 5 lb (2.3 kg) until your health care provider approves.  Do not drive until your health care provider approves.  Do not have sexual intercourse until your health care provider says it is  okay.  Take your temperature twice a day for the first week. Write those temperatures down. SEEK MEDICAL CARE IF:  You have pain when you urinate.  You see pus coming out of the incision, or the incision is separating.  You have increasing abdominal pain.  You have swelling or redness in the incision area.  You develop a rash.  You feel lightheaded.  You have pain that is not controlled with medicine. SEEK IMMEDIATE MEDICAL CARE  IF:  You develop a fever.  You have increasing abdominal pain.  You develop chest or leg pain.  You develop shortness of breath.  You pass out.   This information is not intended to replace advice given to you by your health care provider. Make sure you discuss any questions you have with your health care provider.   Document Released: 09/27/2010 Document Revised: 07/14/2014 Document Reviewed: 12/15/2012 Elsevier Interactive Patient Education [employer].

## 2016-05-06 NOTE — Interval H&P Note (Signed)
History and Physical Interval Note:  05/06/2016 6:38 AM  [name]  has presented today for surgery, with the diagnosis of menorrhagia sterilization request  The various methods of treatment have been discussed with the patient and family. After consideration of risks, benefits and other options for treatment, the patient has consented to  Procedure(s): DILATATION & CURETTAGE/HYSTEROSCOPY WITH NOVASURE ABLATION (N/A) LAPAROSCOPIC BILATERAL SALPINGECTOMY (Bilateral) as a surgical intervention. There is an ENDOMETRIAL POLYP suspected, which will be REMOVED as part of the Dilation and curettage..  The patient's history has been reviewed, patient examined, no change in status, other than the low potassium has been corrected., so she is now stable for surgery.  I have reviewed the patient's chart and labs.  Questions were answered to the patient's satisfaction.   The surgery was previously cancelled due to hypokalemia, K 2.6, and this has been corrected , and yesterday it was 3.4.  CBC    Component Value Date/Time   WBC 9.7 05/05/2016 0953   RBC 5.74 (H) 05/05/2016 0953   HGB 13.1 05/05/2016 0953   HCT 40.3 05/05/2016 0953   HCT 36.8 04/09/2016 1657   PLT 307 05/05/2016 0953   PLT 331 04/09/2016 1657   MCV 70.2 (L) 05/05/2016 0953   MCV 68 (L) 04/09/2016 1657   MCH 22.8 (L) 05/05/2016 0953   MCHC 32.5 05/05/2016 0953   RDW 15.5 05/05/2016 0953   RDW 16.4 (H) 04/09/2016 1657   LYMPHSABS 1.7 04/07/2012 1105   MONOABS 0.5 04/07/2012 1105   EOSABS 0.1 04/07/2012 1105   BASOSABS 0.1 04/07/2012 1105   BMET    Component Value Date/Time   NA 138 05/05/2016 0953   K 3.4 (L) 05/05/2016 0953   CL 105 05/05/2016 0953   CO2 26 05/05/2016 0953   GLUCOSE 103 (H) 05/05/2016 0953   BUN 11 05/05/2016 0953   CREATININE 1.06 (H) 05/05/2016 0953   CREATININE 0.86 05/17/2014 1233   CALCIUM 9.3 05/05/2016 0953   GFRNONAA >60 05/05/2016 0953   GFRAA >60 05/05/2016 0953     [name]

## 2016-05-06 NOTE — Anesthesia Procedure Notes (Signed)
Procedure Name: Intubation Date/Time: 05/06/2016 7:44 AM Performed by: Vista Deck Pre-anesthesia Checklist: Patient identified, Patient being monitored, Timeout performed, Emergency Drugs available and Suction available Patient Re-evaluated:Patient Re-evaluated prior to inductionOxygen Delivery Method: Circle System Utilized Preoxygenation: Pre-oxygenation with 100% oxygen Intubation Type: IV induction Ventilation: Mask ventilation without difficulty Laryngoscope Size: Miller and 2 Grade View: Grade I Tube type: Oral Tube size: 7.0 mm Number of attempts: 1 Airway Equipment and Method: Stylet and Oral airway Placement Confirmation: ETT inserted through vocal cords under direct vision,  positive ETCO2 and breath sounds checked- equal and bilateral Secured at: 21 cm Tube secured with: Tape Dental Injury: Teeth and Oropharynx as per pre-operative assessment

## 2016-05-06 NOTE — Progress Notes (Signed)
Patient c/o nausea.  Order received for scopolamine patch and odt zofran per Dr Patsey Berthold.

## 2016-05-06 NOTE — Brief Op Note (Signed)
05/06/2016  9:27 AM  PATIENT:  [name]  45 y.o. female  PRE-OPERATIVE DIAGNOSIS:  menorrhagia sterilization request  POST-OPERATIVE DIAGNOSIS:  menorrhagia sterilization request  PROCEDURE:  Procedure(s) with comments: LAPAROSCOPIC BILATERAL SALPINGECTOMY (Bilateral) DILATATION & CURETTAGE/HYSTEROSCOPY WITH NOVASURE ABLATION (N/A) - length-5; width-2.6; power-72 ; time- 1 minute 42 seconds  SURGEON:  Surgeon(s) and Role:    * Jonnie Kind, MD - Primary  PHYSICIAN ASSISTANT:   ASSISTANTS: Henderson, CST   ANESTHESIA:   general and paracervical block  EBL:  Total I/O In: 750 [I.V.:750] Out: 0   BLOOD ADMINISTERED:none  DRAINS: none   LOCAL MEDICATIONS USED:  MARCAINE    and Amount: 30 paracervical ml  SPECIMEN:  Source of Specimen:  Bilateral fallopian tubes specimen no curettings, due to minimal sample obtained for testing  DISPOSITION OF SPECIMEN:  PATHOLOGY  COUNTS:  YES  TOURNIQUET:  * No tourniquets in log *  DICTATION: .Dragon Dictation  PLAN OF CARE: Discharge to home after PACU  PATIENT DISPOSITION:  PACU - hemodynamically stable.   Delay start of Pharmacological VTE agent (>24hrs) due to surgical blood loss or risk of bleeding: not applicable Details of procedure: Patient was taken operating room prepped and draped for combined abdominal and vaginal procedure. Catheter was not required for intended bladder and she voided just before coming to the OR. Timeout was conducted by surgical team. No antibiotics were required. Infraumbilical vertical 1 cm skin incision was made as well as transverse suprapubic and right lower quadrant incisions of similar length. Veress needle was introduced through the umbilicus using water droplet technique for confirmation of intraperitoneal location and CO2 was introduced under 6 mmHg pressure and 3 L CO2 introduced. The laparoscopic 5 mm trocar was introduced through the umbilicus and the pelvis visualized as normal.  There was no evidence of trauma associated with entry. Suprapubic and right lower quadrant trochars were inserted under direct visualization and attention directed to the pelvis. Uterus is retroverted. And easily manipulated. Prior to beginning the abdominal procedure single-tooth tenaculum had been applied to the cervix for manipulation of the uterus, then the uterus flipped into an anterior position, and the fallopian tubes identified bilaterally. Laparoscopic salpingectomy followed. The grasping forceps and elevated the tube which was then amputated using harmonic scalpel and specimen extracted through the suprapubic port. The hemostasis was satisfactory. Saline 60 cc since was instilled in the abdomen which was then deflated, laparoscopic equipment having been removed, and subcuticular 4-0 Vicryl closure and skin incision performed and Steri-Strips applied. Endometrial ablation: With the surgeon in the sitting position the speculum was inserted in the vagina and cervix grasped. the cervix was dilated to 25 [place] and sounded to 8 cm. Hysteroscopy was performed revealing a small endometrial cavity with tubal ostia visualized bilaterally. There was no polyp as previously suspected. Smooth sharp curettage was done and obtained minimal tissue. Hysteroscopy again confirmed endometrial cavity was smooth. The NovaSure endometrial ablation device was then set for a 5 cm endometrial cavity, positioned and the uterine cavity and the standard endometrial ablation sequence conducted as described above with removal of the device after completion of the 1 minute 42 second ablation sequence. Repeat hysteroscopy confirmed that the endometrial cavity was nicely charred and photos were taken for documentation. Paracervical block with 30 cc of Marcaine was then injected and patient allowed to go recovery room in stable condition. Sponge and needles counts were correct.

## 2016-05-06 NOTE — Anesthesia Preprocedure Evaluation (Signed)
Anesthesia Evaluation  Patient identified by MRN, date of birth, ID band Patient awake    Reviewed: Allergy & Precautions, H&P , NPO status , Patient's Chart, lab work & pertinent test results  Airway Mallampati: II   Neck ROM: full    Dental  (+) Teeth Intact   Pulmonary neg pulmonary ROS,    breath sounds clear to auscultation       Cardiovascular hypertension, Pt. on medications + dysrhythmias (hx tachycardia on diltiazem with good control)  Rhythm:regular Rate:Normal     Neuro/Psych    GI/Hepatic negative GI ROS,   Endo/Other    Renal/GU      Musculoskeletal   Abdominal   Peds  Hematology   Anesthesia Other Findings   Reproductive/Obstetrics                             Anesthesia Physical Anesthesia Plan  ASA: II  Anesthesia Plan: General   Post-op Pain Management:    Induction: Intravenous  Airway Management Planned: Oral ETT  Additional Equipment:   Intra-op Plan:   Post-operative Plan: Extubation in OR  Informed Consent: I have reviewed the patients History and Physical, chart, labs and discussed the procedure including the risks, benefits and alternatives for the proposed anesthesia with the patient or authorized representative who has indicated his/her understanding and acceptance.     Plan Discussed with:   Anesthesia Plan Comments:         Anesthesia Quick Evaluation

## 2016-05-07 ENCOUNTER — Encounter (HOSPITAL_COMMUNITY): Payer: Self-pay | Admitting: Obstetrics and Gynecology

## 2016-05-07 ENCOUNTER — Telehealth: Payer: Self-pay | Admitting: Obstetrics and Gynecology

## 2016-05-07 NOTE — Telephone Encounter (Signed)
Pt states had surgery yesterday with Dr.Ferguson, can she take a shower. Per Dr. Glo Herring ok for pt to take a shower. Pt verbalized understanding.

## 2016-05-08 ENCOUNTER — Encounter: Payer: Medicaid Other | Admitting: Obstetrics and Gynecology

## 2016-05-08 ENCOUNTER — Encounter (HOSPITAL_COMMUNITY): Payer: Self-pay | Admitting: Obstetrics and Gynecology

## 2016-05-12 ENCOUNTER — Telehealth: Payer: Self-pay | Admitting: *Deleted

## 2016-05-12 NOTE — Telephone Encounter (Signed)
Pt c/o dizziness since her surgery (tubal ligation) on 05/06/2016. Pt states she thinks she might have a sinus infection, c/o nasal drainage. Pt given an appt with Dr. Glo Herring for evaluation.

## 2016-05-14 ENCOUNTER — Telehealth: Payer: Self-pay | Admitting: *Deleted

## 2016-05-14 ENCOUNTER — Encounter: Payer: Self-pay | Admitting: Obstetrics and Gynecology

## 2016-05-14 NOTE — Telephone Encounter (Signed)
Pt states she is scheduled for her post-op visit on 05/19/16 but her job has called her to come to work today.  Informed pt needs OV to be cleared by the doctor to return to work following surgery, is she feel like she is ready to go back she can come in before the 95 th.  Pt states she really does not feel like going back yet, she is just now improving form the dizziness she was having since the surgery and has not tried to drive herself yet.  Pt states she will wait until her post-op exam to get return to work note.

## 2016-05-16 ENCOUNTER — Other Ambulatory Visit: Payer: Self-pay | Admitting: Family Medicine

## 2016-05-19 ENCOUNTER — Encounter: Payer: Self-pay | Admitting: Obstetrics and Gynecology

## 2016-05-19 ENCOUNTER — Ambulatory Visit (INDEPENDENT_AMBULATORY_CARE_PROVIDER_SITE_OTHER): Payer: Medicaid Other | Admitting: Obstetrics and Gynecology

## 2016-05-19 VITALS — BP 108/80 | HR 64 | Ht 66.0 in | Wt 166.0 lb

## 2016-05-19 DIAGNOSIS — Z9889 Other specified postprocedural states: Secondary | ICD-10-CM

## 2016-05-19 DIAGNOSIS — Z09 Encounter for follow-up examination after completed treatment for conditions other than malignant neoplasm: Secondary | ICD-10-CM

## 2016-05-19 NOTE — Progress Notes (Signed)
Patient ID: [name], female   DOB: October 15, 1970, 45 y.o.   MRN: [id number]  Subjective:  [name] is a 45 y.o. female now 2 weeks status post laparoscopic bilateral salpingectomy, D&C/hysteroscopy with novasure ablation.   Chief Complaint  Patient presents with  . Routine Post Op    Pt states she was dizzy after the procedure for approximately a week, and this resolved last week. She reports she is otherwise doing well. Pt currently takes HCTZ, Cardizem and Metoprolol for HTN, managed by Dr. Moshe Cipro. She states she has not lost weight recently.   Pt states she began to experience spotting this week. She denies significant pain or discomfort.   Review of Systems Negative    Diet:   normal   Bowel movements : normal.  The patient is not having any pain.  Objective:  BP 108/80   Pulse 64   Ht 5\' 6"  (1.676 m)   Wt 166 lb (75.3 kg)   BMI 26.79 kg/m  General:Well developed, well nourished.  No acute distress. Abdomen: Bowel sounds normal, soft, non-tender.  Incision(s):   Healing well, no drainage, no erythema, no hernia, no swelling, no dehiscence,     Assessment:  Post-Op 2 weeks status post laparoscopic bilateral salpingectomy, D&C/hysteroscopy with novasure ablation.   Doing well postoperatively.   Plan:  1.Wound care discussed   2. . current medications: unchanged  3. Activity restrictions: no lifting more than 10 pounds 4. return to work: now. Note provided to pt.  5. BP 108/80 in office, pt symptomatic and instructed to follow up with Dr. Moshe Cipro for reevaluation of current BP medications. (on HCTZ, Cardizem and Metoprolol)  TO CONSIDER REDUCTION OF MEDS. 6. Follow up in PRN .Marland Kitchen   By signing my name below, I, Hansel Feinstein, attest that this documentation has been prepared under the direction and in the presence of Jonnie Kind, MD. Electronically Signed: Hansel Feinstein, ED Scribe. 05/19/16. 9:18 AM.  ,I personally performed the services described in this  documentation, which was SCRIBED in my presence. The recorded information has been reviewed and considered accurate. It has been edited as necessary during review. Jonnie Kind, MD

## 2016-05-20 ENCOUNTER — Encounter: Payer: Self-pay | Admitting: Family Medicine

## 2016-05-20 ENCOUNTER — Ambulatory Visit (INDEPENDENT_AMBULATORY_CARE_PROVIDER_SITE_OTHER): Payer: Medicaid Other | Admitting: Family Medicine

## 2016-05-20 VITALS — BP 120/82 | HR 76 | Resp 16 | Ht 66.0 in | Wt 167.0 lb

## 2016-05-20 DIAGNOSIS — E041 Nontoxic single thyroid nodule: Secondary | ICD-10-CM

## 2016-05-20 DIAGNOSIS — E876 Hypokalemia: Secondary | ICD-10-CM

## 2016-05-20 DIAGNOSIS — N3001 Acute cystitis with hematuria: Secondary | ICD-10-CM | POA: Diagnosis not present

## 2016-05-20 DIAGNOSIS — J0110 Acute frontal sinusitis, unspecified: Secondary | ICD-10-CM

## 2016-05-20 DIAGNOSIS — I10 Essential (primary) hypertension: Secondary | ICD-10-CM

## 2016-05-20 LAB — POCT URINALYSIS DIPSTICK
BILIRUBIN UA: NEGATIVE
GLUCOSE UA: NEGATIVE
NITRITE UA: NEGATIVE
PH UA: 7
Protein, UA: 30
Spec Grav, UA: 1.02
Urobilinogen, UA: 2

## 2016-05-20 MED ORDER — FLUCONAZOLE 150 MG PO TABS: ORAL_TABLET | ORAL | 0 refills | Status: DC

## 2016-05-20 MED ORDER — SULFAMETHOXAZOLE-TRIMETHOPRIM 800-160 MG PO TABS: 1.0000 | ORAL_TABLET | Freq: Two times a day (BID) | ORAL | 0 refills | Status: DC

## 2016-05-20 NOTE — Assessment & Plan Note (Addendum)
3 day h/o frontal pressure and yellow drainage, 5 day course of septra prescribed

## 2016-05-20 NOTE — Patient Instructions (Addendum)
F/u in 4 months, call if you need me sooner.  Check with front desk re thyroid US  Urine tested in office for infection  You are treated for frontal sinusitis, septra x 5 days and fluconazole tablet one  Work excuse to return 05/26/2016  Blood pressure is  good Labs today cBc chem 7, now taking 60 meq potassium daily (3 tablets)  Hope you feel better soon, rest and take it easy!, thankful surgery went well. Blood pressure is good

## 2016-05-21 ENCOUNTER — Other Ambulatory Visit (HOSPITAL_COMMUNITY)
Admission: RE | Admit: 2016-05-21 | Discharge: 2016-05-21 | Disposition: A | Discharge status: Home / Self Care | Payer: Medicaid Other | Source: Ambulatory Visit | Attending: Family Medicine | Admitting: Family Medicine

## 2016-05-21 DIAGNOSIS — N3001 Acute cystitis with hematuria: Secondary | ICD-10-CM | POA: Insufficient documentation

## 2016-05-21 LAB — CBC
HEMATOCRIT: 38.3 % (ref 35.0–45.0)
Hemoglobin: 12.2 g/dL (ref 11.7–15.5)
MCH: 22.2 pg — ABNORMAL LOW (ref 27.0–33.0)
MCHC: 31.9 g/dL — ABNORMAL LOW (ref 32.0–36.0)
MCV: 69.6 fL — ABNORMAL LOW (ref 80.0–100.0)
Platelets: 350 10*3/uL (ref 140–400)
RBC: 5.5 MIL/uL — AB (ref 3.80–5.10)
RDW: 16.3 % — ABNORMAL HIGH (ref 11.0–15.0)
WBC: 8.7 10*3/uL (ref 3.8–10.8)

## 2016-05-21 LAB — BASIC METABOLIC PANEL
BUN: 9 mg/dL (ref 7–25)
CHLORIDE: 103 mmol/L (ref 98–110)
CO2: 31 mmol/L (ref 20–31)
Calcium: 9.2 mg/dL (ref 8.6–10.2)
Creat: 1.06 mg/dL (ref 0.50–1.10)
GLUCOSE: 93 mg/dL (ref 65–99)
POTASSIUM: 3 mmol/L — AB (ref 3.5–5.3)
Sodium: 141 mmol/L (ref 135–146)

## 2016-05-22 LAB — URINE CULTURE

## 2016-05-23 ENCOUNTER — Encounter: Payer: Self-pay | Admitting: Family Medicine

## 2016-05-25 NOTE — Assessment & Plan Note (Addendum)
Specimen sent for c/s, will follow up

## 2016-05-25 NOTE — Progress Notes (Signed)
   [name]     MRN: [id number]      DOB: 10/22/1970   HPI [name] is here c/o dizziness, light headedness and fatigue following recent gyne procedure, approximately 2 weeks ago. Her gyn saw her recently and advised after she stated she was unable to return tpo work, that she see me re her blood pressure was reported a slow, in office today she is normotensive and not orthostatic. Her potassium dose was reduced by gyne and this may well be contributing to her symptoms Pt states she is unable to sit for any time without experiencing significant lower abdominal pain, so she really cannot see herself being able to work at a desk at this time C/o sinus pressure and nasal drainage, had been prescribed septra for supposed UTI by gyne several weeks ago, she never filled script, still c/o pressure and requency, o flank pain, fever or chills  ROS Denies recent fever or chills. Denies sinus pressure, nasal congestion, ear pain or sore throat. Denies chest congestion, productive cough or wheezing. Denies chest pains, palpitations and leg swelling Denies joint pain, swelling and limitation in mobility. Denies headaches, seizures, numbness, or tingling. Denies depression, anxiety or insomnia. Denies skin break down or rash.   PE  BP 120/82 (BP Location: Left Arm, Cuff Size: Normal)   Pulse 76   Resp 16   Ht 5\' 6"  (1.676 m)   Wt 167 lb (75.8 kg)   SpO2 100%   BMI 26.95 kg/m   Patient alert and oriented and in no cardiopulmonary distress.Looks uncomfortable and slightly anxious  HEENT: No facial asymmetry, EOMI,   oropharynx pink and moist.  Neck supple no JVD, no mass.Frontal sinus tenderness  Chest: Clear to auscultation bilaterally.  CVS: S1, S2 no murmurs, no S3.Regular rate.  ABD: Soft lower abdominal tenderness, no guarding or rebound  Ext: No edema  MS: Adequate ROM spine, shoulders, hips and knees.  Skin: Intact, no ulcerations or rash noted.  Psych: Good eye contact,  normal affect. Memory intact mildly anxious not depressed appearing.  CNS: CN 2-12 intact, power,  normal throughout.no focal deficits noted.   Assessment & Plan Frontal sinusitis 3 day h/o frontal pressure and yellow drainage, 5 day course of septra prescribed  Essential hypertension Controlled, no change in medication DASH diet and commitment to daily physical activity for a minimum of 30 minutes discussed and encouraged, as a part of hypertension management. The importance of attaining a healthy weight is also discussed.  BP/Weight 05/20/2016 05/19/2016 05/06/2016 05/05/2016 04/23/2016 04/21/2016 [id number]  Systolic BP 123456 [id number]  Diastolic BP 82 80 72 86 76 72 72  Wt. (Lbs) 167 166 167 167 169 166.4 167.08  BMI 26.95 26.79 26.95 26.95 27.28 26.86 26.97       HYPOKALEMIA Recurrent problem causing lightheadedness/ dizziness, pt re educated re need not to "change med doses" at will  THYROID NODULE repeat US past due , pt to sched and keep appt  Acute cystitis with hematuria Specimen sent for c/s, will follow up

## 2016-05-25 NOTE — Assessment & Plan Note (Signed)
Recurrent problem causing lightheadedness/ dizziness, pt re educated re need not to "change med doses" at will

## 2016-05-25 NOTE — Assessment & Plan Note (Signed)
repeat US past due , pt to sched and keep appt

## 2016-05-25 NOTE — Assessment & Plan Note (Signed)
Controlled, no change in medication DASH diet and commitment to daily physical activity for a minimum of 30 minutes discussed and encouraged, as a part of hypertension management. The importance of attaining a healthy weight is also discussed.  BP/Weight 05/20/2016 05/19/2016 05/06/2016 05/05/2016 04/23/2016 04/21/2016 [id number]  Systolic BP 123456 [id number]  Diastolic BP 82 80 72 86 76 72 72  Wt. (Lbs) 167 166 167 167 169 166.4 167.08  BMI 26.95 26.79 26.95 26.95 27.28 26.86 26.97

## 2016-06-04 ENCOUNTER — Telehealth: Payer: Self-pay | Admitting: Adult Health

## 2016-06-04 MED ORDER — FLUCONAZOLE 150 MG PO TABS: ORAL_TABLET | ORAL | 1 refills | Status: DC

## 2016-06-04 MED ORDER — METRONIDAZOLE 500 MG PO TABS: 500.0000 mg | ORAL_TABLET | Freq: Two times a day (BID) | ORAL | 0 refills | Status: DC

## 2016-06-04 NOTE — Telephone Encounter (Signed)
Pt requests flagyl for BV and diflucan refill

## 2016-06-04 NOTE — Telephone Encounter (Signed)
Left message I called 

## 2016-06-04 NOTE — Telephone Encounter (Signed)
Pt called stating that she would like a call back from [place], [place] did not state the reason why. Please contact pt

## 2016-06-16 ENCOUNTER — Ambulatory Visit: Payer: Medicaid Other

## 2016-06-23 ENCOUNTER — Other Ambulatory Visit: Payer: Self-pay

## 2016-06-23 MED ORDER — POTASSIUM CHLORIDE CRYS ER 20 MEQ PO TBCR: 40.0000 meq | EXTENDED_RELEASE_TABLET | Freq: Two times a day (BID) | ORAL | 2 refills | Status: DC

## 2016-06-25 ENCOUNTER — Telehealth: Payer: Self-pay | Admitting: Adult Health

## 2016-06-25 NOTE — Telephone Encounter (Signed)
Wants discount coupon for daughter on OCs

## 2016-08-14 ENCOUNTER — Ambulatory Visit (INDEPENDENT_AMBULATORY_CARE_PROVIDER_SITE_OTHER): Payer: Medicaid Other | Admitting: Family Medicine

## 2016-08-14 ENCOUNTER — Telehealth: Payer: Self-pay | Admitting: Obstetrics and Gynecology

## 2016-08-14 ENCOUNTER — Encounter: Payer: Self-pay | Admitting: Family Medicine

## 2016-08-14 VITALS — BP 120/80 | HR 78 | Temp 98.6°F | Resp 16 | Ht 66.0 in | Wt 176.0 lb

## 2016-08-14 DIAGNOSIS — I10 Essential (primary) hypertension: Secondary | ICD-10-CM

## 2016-08-14 DIAGNOSIS — J0110 Acute frontal sinusitis, unspecified: Secondary | ICD-10-CM

## 2016-08-14 DIAGNOSIS — E663 Overweight: Secondary | ICD-10-CM

## 2016-08-14 MED ORDER — FLUCONAZOLE 150 MG PO TABS: ORAL_TABLET | ORAL | 0 refills | Status: DC

## 2016-08-14 MED ORDER — SULFAMETHOXAZOLE-TRIMETHOPRIM 800-160 MG PO TABS: 1.0000 | ORAL_TABLET | Freq: Two times a day (BID) | ORAL | 0 refills | Status: DC

## 2016-08-14 NOTE — Patient Instructions (Addendum)
F/u in 6 months, call if you need me before  You are treated for acute sinus infection, septra and fluconazole are prescribed  Blood pressure is excellent, no med change Thank you  for choosing [facility]. We consider it a privelige to serve you.  Delivering excellent health care in a caring and  compassionate way is our goal.  Partnering with you,  so that together we can achieve this goal is our strategy.

## 2016-08-14 NOTE — Telephone Encounter (Signed)
Patient called with complaints of vaginal bleeding x4 days. She is s/p ablation in October. Advised patient to make an appointment to be evaluated. Pt verbalized understanding.

## 2016-08-15 ENCOUNTER — Ambulatory Visit: Payer: Medicaid Other | Admitting: Obstetrics and Gynecology

## 2016-08-16 ENCOUNTER — Encounter: Payer: Self-pay | Admitting: Family Medicine

## 2016-08-16 NOTE — Assessment & Plan Note (Signed)
Deteriorated. Patient re-educated about  the importance of commitment to a  minimum of 150 minutes of exercise per week.  The importance of healthy food choices with portion control discussed. Encouraged to start a food diary, count calories and to consider  joining a support group. Sample diet sheets offered. Goals set by the patient for the next several months.   Weight /BMI 08/14/2016 05/20/2016 05/19/2016  WEIGHT 176 lb 167 lb 166 lb  HEIGHT 5\' 6"  5\' 6"  5\' 6"   BMI 28.41 kg/m2 26.95 kg/m2 26.79 kg/m2

## 2016-08-16 NOTE — Progress Notes (Signed)
   [name]     MRN: [id number]      DOB: [birth year]   HPI [name] is here with a 5 day h/o worsening sinus pressure with green nasal drainage , chills and possible fever. Cough when lying down and denies ear pain, or sore throat    ROS Denies chest pains, palpitations and leg swelling Denies abdominal pain, nausea, vomiting,diarrhea or constipation.   Denies dysuria, frequency, hesitancy or incontinence. Denies joint pain, swelling and limitation in mobility. Denies headaches, seizures, numbness, or tingling. Denies depression, anxiety or insomnia. Denies skin break down or rash.   PE  BP 120/80   Pulse 78   Temp 98.6 F (37 C) (Oral)   Resp 16   Ht 5\' 6"  (1.676 m)   Wt 176 lb (79.8 kg)   SpO2 97%   BMI 28.41 kg/m   Patient alert and oriented and in no cardiopulmonary distress. Ill appearing HEENT: No facial asymmetry, EOMI,   oropharynx pink and moist.  Neck supple bilateral anterior cervical adenitis, frontal sinus tenderness, TM clear  Chest: Clear to auscultation bilaterally.  CVS: S1, S2 no murmurs, no S3.Regular rate.  ABD: Soft non tender.   Ext: No edema  MS: Adequate ROM spine, shoulders, hips and knees.  Skin: Intact, no ulcerations or rash noted.  Psych: Good eye contact, normal affect. Memory intact not anxious or depressed appearing.  CNS: CN 2-12 intact, power,  normal throughout.no focal deficits noted.   Assessment & Plan  Frontal sinusitis Septra prescribed and advised saline nasal flushes twice daily and use of tylenol as needed. Fluconazole prescribed for anticipated yeast infection  Essential hypertension Controlled, no change in medication DASH diet and commitment to daily physical activity for a minimum of 30 minutes discussed and encouraged, as a part of hypertension management. The importance of attaining a healthy weight is also discussed.  BP/Weight 08/14/2016 05/20/2016 05/19/2016 05/06/2016 05/05/2016 04/23/2016 0000000    Systolic BP 123456 123456 [id number] 0000000 [id number]  Diastolic BP 80 82 80 72 86 76 72  Wt. (Lbs) 176 167 166 167 167 169 166.4  BMI 28.41 26.95 26.79 26.95 26.95 27.28 26.86       Overweight (BMI 25.0-29.9) Deteriorated. Patient re-educated about  the importance of commitment to a  minimum of 150 minutes of exercise per week.  The importance of healthy food choices with portion control discussed. Encouraged to start a food diary, count calories and to consider  joining a support group. Sample diet sheets offered. Goals set by the patient for the next several months.   Weight /BMI 08/14/2016 05/20/2016 05/19/2016  WEIGHT 176 lb 167 lb 166 lb  HEIGHT 5\' 6"  5\' 6"  5\' 6"   BMI 28.41 kg/m2 26.95 kg/m2 26.79 kg/m2

## 2016-08-16 NOTE — Assessment & Plan Note (Signed)
Controlled, no change in medication DASH diet and commitment to daily physical activity for a minimum of 30 minutes discussed and encouraged, as a part of hypertension management. The importance of attaining a healthy weight is also discussed.  BP/Weight 08/14/2016 05/20/2016 05/19/2016 05/06/2016 05/05/2016 04/23/2016 0000000  Systolic BP 123456 123456 [id number]  Diastolic BP 80 82 80 72 86 76 72  Wt. (Lbs) 176 167 166 167 167 169 166.4  BMI 28.41 26.95 26.79 26.95 26.95 27.28 26.86

## 2016-08-16 NOTE — Assessment & Plan Note (Signed)
Septra prescribed and advised saline nasal flushes twice daily and use of tylenol as needed. Fluconazole prescribed for anticipated yeast infection

## 2016-08-18 ENCOUNTER — Encounter: Payer: Self-pay | Admitting: Family Medicine

## 2016-08-20 ENCOUNTER — Ambulatory Visit: Payer: Medicaid Other | Admitting: Obstetrics and Gynecology

## 2016-09-16 ENCOUNTER — Ambulatory Visit: Payer: Medicaid Other | Admitting: Family Medicine

## 2016-10-29 ENCOUNTER — Ambulatory Visit: Payer: 59 | Admitting: Adult Health

## 2016-10-29 ENCOUNTER — Telehealth: Payer: Self-pay | Admitting: *Deleted

## 2016-10-29 MED ORDER — METRONIDAZOLE 0.75 % VA GEL: 1.0000 | Freq: Every day | VAGINAL | 0 refills | Status: DC

## 2016-10-29 NOTE — Telephone Encounter (Signed)
Had ablation 04/08/16 and bleeds after sex for about a week, Rx metrogel no sex x 1 week if happens again make appt.

## 2016-11-13 ENCOUNTER — Other Ambulatory Visit: Payer: Self-pay | Admitting: Family Medicine

## 2016-11-14 ENCOUNTER — Other Ambulatory Visit: Payer: Self-pay | Admitting: Family Medicine

## 2016-11-21 ENCOUNTER — Ambulatory Visit: Payer: 59 | Admitting: Adult Health

## 2016-12-11 ENCOUNTER — Other Ambulatory Visit: Payer: Self-pay | Admitting: Family Medicine

## 2016-12-11 ENCOUNTER — Telehealth: Payer: Self-pay

## 2016-12-11 ENCOUNTER — Telehealth: Payer: Self-pay | Admitting: Family Medicine

## 2016-12-11 MED ORDER — LEVOFLOXACIN 500 MG PO TABS: 500.0000 mg | ORAL_TABLET | Freq: Every day | ORAL | 0 refills | Status: DC

## 2016-12-11 MED ORDER — BENZONATATE 100 MG PO CAPS: 100.0000 mg | ORAL_CAPSULE | Freq: Two times a day (BID) | ORAL | 0 refills | Status: DC | PRN

## 2016-12-11 MED ORDER — FLUCONAZOLE 150 MG PO TABS: ORAL_TABLET | ORAL | 0 refills | Status: DC

## 2016-12-11 NOTE — Telephone Encounter (Signed)
See previous duplicate tele message

## 2016-12-11 NOTE — Telephone Encounter (Signed)
Patient is requesting a call, she asked for an appt then asked for medication to be sent to [facility] for a possible sinus infection. Yellow sinus mucus, no fever, sinus pain and pressure and sinus headache. States you always call her in something for this. Please advise

## 2016-12-11 NOTE — Telephone Encounter (Signed)
Patient is requesting a call, she asked for an appt then asked for medication to be sent to [facility] for a possible sinus infection ? She insisted to talk to a nurse.  Cb#: [phone]

## 2016-12-11 NOTE — Progress Notes (Signed)
levaquin  

## 2016-12-11 NOTE — Telephone Encounter (Signed)
Spoke with pt levaquin, fluconazole and tessalon perle prescribed, 1 week historyu of cough and chest congestion and sinus pressure worsening despite oTC meds

## 2016-12-24 ENCOUNTER — Other Ambulatory Visit: Payer: Self-pay | Admitting: Family Medicine

## 2017-01-08 ENCOUNTER — Telehealth: Payer: Self-pay | Admitting: *Deleted

## 2017-01-08 MED ORDER — POTASSIUM CHLORIDE CRYS ER 20 MEQ PO TBCR: 40.0000 meq | EXTENDED_RELEASE_TABLET | Freq: Two times a day (BID) | ORAL | 2 refills | Status: DC

## 2017-01-08 NOTE — Telephone Encounter (Signed)
Patient called requesting potassium to be refilled. Please advise [phone]

## 2017-01-08 NOTE — Telephone Encounter (Signed)
Medication sent to pharmacy  

## 2017-01-23 ENCOUNTER — Other Ambulatory Visit: Payer: Self-pay | Admitting: Family Medicine

## 2017-02-03 ENCOUNTER — Telehealth: Payer: Self-pay | Admitting: *Deleted

## 2017-02-03 NOTE — Telephone Encounter (Signed)
Patient called stating she received a message from Dr Moshe Cipro stating for her to come in for a med refill, I offered patient the next available 8/13 and patient requested something sooner, I made patient aware as of now we don't have anything sooner. Patient requested to be worked in for a yeast infection. Patient states she thinks she may have a yeast infection. Please advise

## 2017-02-03 NOTE — Telephone Encounter (Signed)
Called pt and left message to call back with her symptoms

## 2017-02-06 NOTE — Telephone Encounter (Signed)
Called patient and left message for them to return call at the office   

## 2017-02-12 ENCOUNTER — Encounter: Payer: Self-pay | Admitting: Family Medicine

## 2017-02-12 ENCOUNTER — Ambulatory Visit (INDEPENDENT_AMBULATORY_CARE_PROVIDER_SITE_OTHER): Payer: 59 | Admitting: Family Medicine

## 2017-02-12 VITALS — BP 110/80 | HR 60 | Temp 97.5°F | Resp 16 | Ht 66.0 in | Wt 172.8 lb

## 2017-02-12 DIAGNOSIS — I10 Essential (primary) hypertension: Secondary | ICD-10-CM | POA: Diagnosis not present

## 2017-02-12 DIAGNOSIS — E663 Overweight: Secondary | ICD-10-CM | POA: Diagnosis not present

## 2017-02-12 DIAGNOSIS — R3915 Urgency of urination: Secondary | ICD-10-CM

## 2017-02-12 DIAGNOSIS — E041 Nontoxic single thyroid nodule: Secondary | ICD-10-CM | POA: Diagnosis not present

## 2017-02-12 DIAGNOSIS — R7303 Prediabetes: Secondary | ICD-10-CM

## 2017-02-12 DIAGNOSIS — E876 Hypokalemia: Secondary | ICD-10-CM

## 2017-02-12 LAB — POCT URINALYSIS DIPSTICK
BILIRUBIN UA: NEGATIVE
GLUCOSE UA: NEGATIVE
KETONES UA: NEGATIVE
NITRITE UA: NEGATIVE
Spec Grav, UA: >=1.03 — AB (ref 1.010–1.025)
UROBILINOGEN UA: 1 U/dL
pH, UA: 6 (ref 5.0–8.0)

## 2017-02-12 NOTE — Assessment & Plan Note (Signed)
Symptomatic with abnormal uA need to follow through on urine culture

## 2017-02-12 NOTE — Assessment & Plan Note (Signed)
For rept imaging and endo f/u , will call when treaty for referral

## 2017-02-12 NOTE — Progress Notes (Signed)
[name]     MRN: [id number]      DOB: [birth year]   HPI [name] is here for follow up and re-evaluation of chronic medical conditions, medication management and review of any available recent lab and radiology data.  Preventive health is updated, specifically  Cancer screening and Immunization.   Started having monthly cycle in last several months, despite recent ablation has upcoming appt with gyne to discuss further. 1 week h/o urinary urgency, denies fever, chills or flank pain Will start new job with full medical coverage in next 2 to 3 weeks , and is holding on labs , imaging and referrals until that time  ROS Denies recent fever or chills. Denies sinus pressure, nasal congestion, ear pain or sore throat. Denies chest congestion, productive cough or wheezing. Denies chest pains, palpitations and leg swelling Denies abdominal pain, nausea, vomiting,diarrhea or constipation.    Denies joint pain, swelling and limitation in mobility. Denies headaches, seizures, numbness, or tingling. Denies depression, anxiety or insomnia. Denies skin break down or rash.   PE  BP 110/80 (BP Location: Left Arm, Patient Position: Sitting, Cuff Size: Normal)   Pulse 60   Temp (!) 97.5 F (36.4 C) (Other (Comment))   Resp 16   Ht 5\' 6"  (1.676 m)   Wt 172 lb 12 oz (78.4 kg)   SpO2 98%   BMI 27.88 kg/m   Patient alert and oriented and in no cardiopulmonary distress.  HEENT: No facial asymmetry, EOMI,   oropharynx pink and moist.  Neck supple no JVD, no mass.  Chest: Clear to auscultation bilaterally.  CVS: S1, S2 no murmurs, no S3.Regular rate.  ABD: Soft non tender. No renal angle tenderness  Ext: No edema  MS: Adequate ROM spine, shoulders, hips and knees.  Skin: Intact, no ulcerations or rash noted.  Psych: Good eye contact, normal affect. Memory intact not anxious or depressed appearing.  CNS: CN 2-12 intact, power,  normal throughout.no focal deficits  noted.   Assessment & Plan  Essential hypertension Controlled, no change in medication DASH diet and commitment to daily physical activity for a minimum of 30 minutes discussed and encouraged, as a part of hypertension management. The importance of attaining a healthy weight is also discussed.  BP/Weight 02/12/2017 08/14/2016 05/20/2016 05/19/2016 05/06/2016 05/05/2016 95/03/3266  Systolic BP 124 580 998 338 250 539 767  Diastolic BP 80 80 82 80 72 86 76  Wt. (Lbs) 172.75 176 167 166 167 167 169  BMI 27.88 28.41 26.95 26.79 26.95 26.95 27.28       Overweight (BMI 25.0-29.9) Improved Patient re-educated about  the importance of commitment to a  minimum of 150 minutes of exercise per week.  The importance of healthy food choices with portion control discussed. Encouraged to start a food diary, count calories and to consider  joining a support group. Sample diet sheets offered. Goals set by the patient for the next several months.   Weight /BMI 02/12/2017 08/14/2016 05/20/2016  WEIGHT 172 lb 12 oz 176 lb 167 lb  HEIGHT 5\' 6"  5\' 6"  5\' 6"   BMI 27.88 kg/m2 28.41 kg/m2 26.95 kg/m2      HYPOKALEMIA Recurrent problem Updated lab needed    Prediabetes Patient educated about the importance of limiting  Carbohydrate intake , the need to commit to daily physical activity for a minimum of 30 minutes , and to commit weight loss. The fact that changes in all these areas will reduce or eliminate all together the  development of diabetes is stressed.   Diabetic Labs Latest Ref Rng & Units 05/21/2016 05/05/2016 04/23/2016 05/17/2014 07/23/2013  HbA1c <5.7 % - - - 5.9(H) 5.6  Chol 0 - 200 mg/dL - - - - 130  HDL >39 mg/dL - - - - 38(L)  Calc LDL 0 - 99 mg/dL - - - - 79  Triglycerides <150 mg/dL - - - - 67  Creatinine 0.50 - 1.10 mg/dL 1.06 1.06(H) 0.92 0.86 0.85   BP/Weight 02/12/2017 08/14/2016 05/20/2016 05/19/2016 05/06/2016 05/05/2016 44/07/270  Systolic BP 536 644 034 742 126 595 638   Diastolic BP 80 80 82 80 72 86 76  Wt. (Lbs) 172.75 176 167 166 167 167 169  BMI 27.88 28.41 26.95 26.79 26.95 26.95 27.28   No flowsheet data found.    Urinary urgency Symptomatic with abnormal uA need to follow through on urine culture  THYROID NODULE For rept imaging and endo f/u , will call when treaty for referral

## 2017-02-12 NOTE — Assessment & Plan Note (Signed)
Improved Patient re-educated about  the importance of commitment to a  minimum of 150 minutes of exercise per week.  The importance of healthy food choices with portion control discussed. Encouraged to start a food diary, count calories and to consider  joining a support group. Sample diet sheets offered. Goals set by the patient for the next several months.   Weight /BMI 02/12/2017 08/14/2016 05/20/2016  WEIGHT 172 lb 12 oz 176 lb 167 lb  HEIGHT 5\' 6"  5\' 6"  5\' 6"   BMI 27.88 kg/m2 28.41 kg/m2 26.95 kg/m2

## 2017-02-12 NOTE — Assessment & Plan Note (Signed)
Controlled, no change in medication DASH diet and commitment to daily physical activity for a minimum of 30 minutes discussed and encouraged, as a part of hypertension management. The importance of attaining a healthy weight is also discussed.  BP/Weight 02/12/2017 08/14/2016 05/20/2016 05/19/2016 05/06/2016 05/05/2016 82/42/3536  Systolic BP 144 315 400 867 619 509 326  Diastolic BP 80 80 82 80 72 86 76  Wt. (Lbs) 172.75 176 167 166 167 167 169  BMI 27.88 28.41 26.95 26.79 26.95 26.95 27.28

## 2017-02-12 NOTE — Patient Instructions (Signed)
F/u in January, call if you need me before  Call for lab order when you decide  Please schedule your mammogram   Meds will be refilled    We will notify you of you urine result, no infection for sure at this  time will follow through  Thank you  for choosing [facility]. We consider it a privelige to serve you.  Delivering excellent health care in a caring and  compassionate way is our goal.  Partnering with you,  so that together we can achieve this goal is our strategy.

## 2017-02-12 NOTE — Assessment & Plan Note (Signed)
Patient educated about the importance of limiting  Carbohydrate intake , the need to commit to daily physical activity for a minimum of 30 minutes , and to commit weight loss. The fact that changes in all these areas will reduce or eliminate all together the development of diabetes is stressed.   Diabetic Labs Latest Ref Rng & Units 05/21/2016 05/05/2016 04/23/2016 05/17/2014 07/23/2013  HbA1c <5.7 % - - - 5.9(H) 5.6  Chol 0 - 200 mg/dL - - - - 130  HDL >39 mg/dL - - - - 38(L)  Calc LDL 0 - 99 mg/dL - - - - 79  Triglycerides <150 mg/dL - - - - 67  Creatinine 0.50 - 1.10 mg/dL 1.06 1.06(H) 0.92 0.86 0.85   BP/Weight 02/12/2017 08/14/2016 05/20/2016 05/19/2016 05/06/2016 05/05/2016 43/83/7793  Systolic BP 968 864 847 207 218 288 337  Diastolic BP 80 80 82 80 72 86 76  Wt. (Lbs) 172.75 176 167 166 167 167 169  BMI 27.88 28.41 26.95 26.79 26.95 26.95 27.28   No flowsheet data found.

## 2017-02-12 NOTE — Assessment & Plan Note (Signed)
Recurrent problem Updated lab needed

## 2017-02-13 ENCOUNTER — Telehealth: Payer: Self-pay | Admitting: Family Medicine

## 2017-02-13 DIAGNOSIS — R350 Frequency of micturition: Secondary | ICD-10-CM

## 2017-02-13 NOTE — Telephone Encounter (Signed)
Culture ordered. Patient was called and informed, she states she will come by today.

## 2017-02-13 NOTE — Telephone Encounter (Signed)
-----   Message from Fayrene Helper, MD sent at 02/12/2017 11:03 PM EDT ----- Regarding: urine for testing Is the urine from the visit sent for c/s?

## 2017-02-14 ENCOUNTER — Encounter: Payer: Self-pay | Admitting: Family Medicine

## 2017-02-14 LAB — URINE CULTURE

## 2017-03-03 ENCOUNTER — Encounter: Payer: Self-pay | Admitting: Family Medicine

## 2017-03-03 ENCOUNTER — Other Ambulatory Visit: Payer: Self-pay | Admitting: Family Medicine

## 2017-03-03 ENCOUNTER — Telehealth: Payer: Self-pay | Admitting: Family Medicine

## 2017-03-03 MED ORDER — FLUCONAZOLE 150 MG PO TABS: 150.0000 mg | ORAL_TABLET | Freq: Once | ORAL | 0 refills | Status: DC

## 2017-03-03 MED ORDER — PENICILLIN V POTASSIUM 500 MG PO TABS
500.0000 mg | ORAL_TABLET | Freq: Three times a day (TID) | ORAL | 0 refills | Status: DC
Start: 2017-03-03 — End: 2017-04-03

## 2017-03-03 NOTE — Telephone Encounter (Signed)
Patient called nurse line, she states she was just seen and can't afford to come back in, but is having sinus problems- the same stuff she gets every year, and asks if Dr. Moshe Cipro can send in anything for her. Callback# [phone]

## 2017-03-03 NOTE — Telephone Encounter (Signed)
Tried to call no answer could not leave a message meds sent, will send my chart msg

## 2017-03-14 ENCOUNTER — Other Ambulatory Visit: Payer: Self-pay | Admitting: Family Medicine

## 2017-03-16 NOTE — Telephone Encounter (Signed)
Seen [phone] 

## 2017-04-03 ENCOUNTER — Telehealth: Payer: Self-pay | Admitting: Family Medicine

## 2017-04-03 ENCOUNTER — Ambulatory Visit (INDEPENDENT_AMBULATORY_CARE_PROVIDER_SITE_OTHER): Payer: 59 | Admitting: Adult Health

## 2017-04-03 ENCOUNTER — Encounter: Payer: Self-pay | Admitting: Adult Health

## 2017-04-03 ENCOUNTER — Other Ambulatory Visit: Payer: Self-pay | Admitting: Family Medicine

## 2017-04-03 VITALS — BP 112/78 | HR 54 | Ht 66.5 in | Wt 172.0 lb

## 2017-04-03 DIAGNOSIS — N920 Excessive and frequent menstruation with regular cycle: Secondary | ICD-10-CM | POA: Diagnosis not present

## 2017-04-03 DIAGNOSIS — Z1212 Encounter for screening for malignant neoplasm of rectum: Secondary | ICD-10-CM | POA: Diagnosis not present

## 2017-04-03 DIAGNOSIS — Z1231 Encounter for screening mammogram for malignant neoplasm of breast: Secondary | ICD-10-CM

## 2017-04-03 DIAGNOSIS — Z01411 Encounter for gynecological examination (general) (routine) with abnormal findings: Secondary | ICD-10-CM | POA: Diagnosis not present

## 2017-04-03 DIAGNOSIS — Z1211 Encounter for screening for malignant neoplasm of colon: Secondary | ICD-10-CM | POA: Diagnosis not present

## 2017-04-03 DIAGNOSIS — Z01419 Encounter for gynecological examination (general) (routine) without abnormal findings: Secondary | ICD-10-CM

## 2017-04-03 LAB — HEMOCCULT GUIAC POC 1CARD (OFFICE): FECAL OCCULT BLD: NEGATIVE

## 2017-04-03 NOTE — Progress Notes (Signed)
Patient ID: [name], female   DOB: 1970-08-22, 46 y.o.   MRN: [id number] History of Present Illness: [name] is a 46 year old black female, in for a well woman gyn exam, she had a normal pap with negative HPV 09/27/14.She had tubal and ablation 05/06/16 and is having monthly spotting and some cramps.She is now working at [employer] with food stamps.  PCP is Dr Moshe Cipro.    Current Medications, Allergies, Past Medical History, Past Surgical History, Family History and Social History were reviewed in [employer] record.     Review of Systems: Patient denies any headaches, hearing loss, fatigue, blurred vision, shortness of breath, chest pain, abdominal pain, problems with bowel movements, urination, or intercourse. No joint pain or mood swings.+spotting and some cramps.    Physical Exam:BP 112/78 (BP Location: Left Arm, Patient Position: Sitting, Cuff Size: Normal)   Pulse (!) 54   Ht 5' 6.5" (1.689 m)   Wt 172 lb (78 kg)   LMP 03/30/2017 Comment: has spotting every month  BMI 27.35 kg/m  General:  Well developed, well nourished, no acute distress Skin:  Warm and dry Neck:  Midline trachea, thyroid nodule on right, good ROM, no lymphadenopathy Lungs; Clear to auscultation bilaterally Breast:  No dominant palpable mass, retraction, or nipple discharge Cardiovascular: Regular rate and rhythm Abdomen:  Soft, non tender, no hepatosplenomegaly Pelvic:  External genitalia is normal in appearance, no lesions.  The vagina is normal in appearance,+period like blood. Urethra has no lesions or masses. The cervix is bulbous.  Uterus is felt to be normal size, shape, and contour.  No adnexal masses or tenderness noted.Bladder is non tender, no masses felt. Rectal: Good sphincter tone, no polyps, or hemorrhoids felt.  Hemoccult negative. Extremities/musculoskeletal:  No swelling or varicosities noted, no clubbing or cyanosis Psych:  No mood changes, alert and cooperative,seems happy PHQ  2 score 0. She declines any meds for spotting.   Impression: 1. Well female exam with routine gynecological exam   2. Screening for colorectal cancer   3. Spotting       Plan: Pap and physical in 1 year  Get mammogram  Get thyroid US per PCP  [place] or [place] [facility]

## 2017-04-03 NOTE — Patient Instructions (Addendum)
Pap and physical in 1 year  Get mammogram  Get thyroid US per PCP  Labs with PCP or at [facility]

## 2017-04-03 NOTE — Telephone Encounter (Signed)
Patient's order for [place] has expired and she called to RS but couldn't.  She would like the appt for Oct 8th if possible close to 6:15 when she has her mammogram (so she can take care of both at the same time)    cb [phone]

## 2017-04-06 ENCOUNTER — Other Ambulatory Visit: Payer: Self-pay | Admitting: Family Medicine

## 2017-04-06 DIAGNOSIS — E042 Nontoxic multinodular goiter: Secondary | ICD-10-CM

## 2017-04-06 NOTE — Telephone Encounter (Signed)
Order entered and requested  Date also, pls sched and let her know

## 2017-04-07 ENCOUNTER — Telehealth: Payer: Self-pay

## 2017-04-07 NOTE — Telephone Encounter (Signed)
-----   Message from Fayrene Helper, MD sent at 04/07/2017  5:27 AM EDT ----- Regarding: pls see if you are able to assist in scheduling this US thyroid  See tele message in Breanna's box pls etc

## 2017-04-07 NOTE — Telephone Encounter (Signed)
Appt scheduled 3:30 04/13/17.  Talked with pt and gave her information.

## 2017-04-13 ENCOUNTER — Ambulatory Visit (HOSPITAL_COMMUNITY)
Admission: RE | Admit: 2017-04-13 | Discharge: 2017-04-13 | Disposition: A | Discharge status: Home / Self Care | Payer: Commercial Managed Care - PPO | Source: Ambulatory Visit | Attending: Family Medicine | Admitting: Family Medicine

## 2017-04-13 ENCOUNTER — Ambulatory Visit (HOSPITAL_COMMUNITY): Payer: 59

## 2017-04-13 DIAGNOSIS — Z1231 Encounter for screening mammogram for malignant neoplasm of breast: Secondary | ICD-10-CM

## 2017-04-15 NOTE — Telephone Encounter (Signed)
Called to schedule, was told patient did not need [place]

## 2017-04-24 ENCOUNTER — Telehealth: Payer: Self-pay | Admitting: Adult Health

## 2017-04-24 ENCOUNTER — Ambulatory Visit (HOSPITAL_COMMUNITY)
Admission: RE | Admit: 2017-04-24 | Discharge: 2017-04-24 | Disposition: A | Discharge status: Home / Self Care | Payer: Commercial Managed Care - PPO | Source: Ambulatory Visit | Attending: Family Medicine | Admitting: Family Medicine

## 2017-04-24 ENCOUNTER — Encounter: Payer: Self-pay | Admitting: Family Medicine

## 2017-04-24 ENCOUNTER — Other Ambulatory Visit: Payer: Self-pay | Admitting: Adult Health

## 2017-04-24 DIAGNOSIS — E042 Nontoxic multinodular goiter: Secondary | ICD-10-CM | POA: Insufficient documentation

## 2017-04-24 MED ORDER — METRONIDAZOLE 500 MG PO TABS: 500.0000 mg | ORAL_TABLET | Freq: Two times a day (BID) | ORAL | 0 refills | Status: DC

## 2017-04-24 NOTE — Telephone Encounter (Signed)
Patient called stating that she would like a call back from Jennifer, Patient did not state the reason why. Please contact pt °

## 2017-04-24 NOTE — Telephone Encounter (Signed)
Will rx flagyl  

## 2017-04-24 NOTE — Telephone Encounter (Signed)
Patient called stating Dr Moshe Cipro gave her penicillin for a sinus infection.  Stomach starting hurting after taking one. She went to [place] at her job and was given a zpac. States she now has a fishy odor, no discharge. She took 1 Diflucan but didn't help.   She is getting married tomorrow. Please advise.

## 2017-05-12 ENCOUNTER — Other Ambulatory Visit: Payer: Self-pay | Admitting: Family Medicine

## 2017-06-24 ENCOUNTER — Other Ambulatory Visit: Payer: Self-pay | Admitting: Family Medicine

## 2017-06-24 NOTE — Telephone Encounter (Signed)
Seen [phone] 

## 2017-07-30 ENCOUNTER — Telehealth: Payer: Self-pay | Admitting: Family Medicine

## 2017-07-30 NOTE — Telephone Encounter (Signed)
Unable to determine without an eval , will need to see  Provider, If availablity in office offer a slot, if not, then urgent care

## 2017-07-30 NOTE — Telephone Encounter (Signed)
Pt is calling in stating all weekend her lft side of neck was hurting used a heating pad, and now its the right side of her neck, please call her at [phone] ext [phone]

## 2017-07-30 NOTE — Telephone Encounter (Signed)
Patient called to check the status of her last msg send to [place]. I let her know that clinic was still running and she would receive a call back asap.

## 2017-07-30 NOTE — Telephone Encounter (Signed)
States last weekend the left side of her neck was hurting- sore between her neck and shoulder and now its hurting in the right side of her neck- states sore to touch and hurts to turn. Please advise

## 2017-07-30 NOTE — Telephone Encounter (Signed)
Can try calling back tomorrow at 8am for same day appt with available provider or go to urgent care. Dr Moshe Cipro is unable to treat without clinical evaluation

## 2017-07-31 ENCOUNTER — Other Ambulatory Visit: Payer: Self-pay

## 2017-07-31 ENCOUNTER — Ambulatory Visit: Payer: Commercial Managed Care - PPO | Admitting: Family Medicine

## 2017-07-31 ENCOUNTER — Encounter (HOSPITAL_COMMUNITY): Payer: Self-pay | Admitting: Emergency Medicine

## 2017-07-31 ENCOUNTER — Emergency Department (HOSPITAL_COMMUNITY)
Admission: EM | Admit: 2017-07-31 | Discharge: 2017-07-31 | Disposition: A | Discharge status: Home / Self Care | Payer: Commercial Managed Care - PPO | Attending: Emergency Medicine | Admitting: Emergency Medicine

## 2017-07-31 ENCOUNTER — Emergency Department (HOSPITAL_COMMUNITY): Payer: Commercial Managed Care - PPO

## 2017-07-31 DIAGNOSIS — G44201 Tension-type headache, unspecified, intractable: Secondary | ICD-10-CM | POA: Diagnosis not present

## 2017-07-31 DIAGNOSIS — M542 Cervicalgia: Secondary | ICD-10-CM | POA: Diagnosis not present

## 2017-07-31 DIAGNOSIS — Z87798 Personal history of other (corrected) congenital malformations: Secondary | ICD-10-CM | POA: Diagnosis not present

## 2017-07-31 DIAGNOSIS — Z79899 Other long term (current) drug therapy: Secondary | ICD-10-CM | POA: Diagnosis not present

## 2017-07-31 DIAGNOSIS — I10 Essential (primary) hypertension: Secondary | ICD-10-CM | POA: Diagnosis not present

## 2017-07-31 DIAGNOSIS — R51 Headache: Secondary | ICD-10-CM | POA: Diagnosis present

## 2017-07-31 MED ORDER — ISOMETHEPTENE-DICHLORAL-APAP 65-100-325 MG PO CAPS: 1.0000 | ORAL_CAPSULE | Freq: Once | ORAL | Status: DC

## 2017-07-31 MED ORDER — BUTALBITAL-APAP-CAFFEINE 50-325-40 MG PO TABS
1.0000 | ORAL_TABLET | Freq: Once | ORAL | Status: AC
  Administered 2017-07-31: 1 via ORAL
  Filled 2017-07-31: qty 1

## 2017-07-31 MED ORDER — ISOMETHEPTENE-DICHLORAL-APAP 65-100-325 MG PO CAPS: ORAL_CAPSULE | ORAL | 0 refills | Status: DC

## 2017-07-31 NOTE — ED Triage Notes (Addendum)
Patient c/o headache x1 week. Per patient started on left side at beginning of week and now is on right side. Denies any nausea, vomiting, or photosensitivity. No neurological deficits. Denies any dizziness, or blurred vision. Per patient has been taking tylenol with some relief but has not taken any today. Per patient pain radiates into neck and bilateral ear pressure.
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
## 2017-07-31 NOTE — ED Notes (Signed)
Patient transported to CT 

## 2017-09-29 ENCOUNTER — Encounter (HOSPITAL_COMMUNITY): Payer: Self-pay | Admitting: Cardiology

## 2017-09-29 ENCOUNTER — Emergency Department (HOSPITAL_COMMUNITY)
Admission: EM | Admit: 2017-09-29 | Discharge: 2017-09-29 | Disposition: A | Discharge status: Home / Self Care | Payer: Commercial Managed Care - PPO | Attending: Emergency Medicine | Admitting: Emergency Medicine

## 2017-09-29 ENCOUNTER — Emergency Department (HOSPITAL_COMMUNITY): Payer: Commercial Managed Care - PPO

## 2017-09-29 DIAGNOSIS — S199XXA Unspecified injury of neck, initial encounter: Secondary | ICD-10-CM | POA: Diagnosis present

## 2017-09-29 DIAGNOSIS — I10 Essential (primary) hypertension: Secondary | ICD-10-CM | POA: Insufficient documentation

## 2017-09-29 DIAGNOSIS — Y999 Unspecified external cause status: Secondary | ICD-10-CM | POA: Diagnosis not present

## 2017-09-29 DIAGNOSIS — S161XXA Strain of muscle, fascia and tendon at neck level, initial encounter: Secondary | ICD-10-CM

## 2017-09-29 DIAGNOSIS — Y939 Activity, unspecified: Secondary | ICD-10-CM | POA: Insufficient documentation

## 2017-09-29 DIAGNOSIS — Y929 Unspecified place or not applicable: Secondary | ICD-10-CM | POA: Diagnosis not present

## 2017-09-29 DIAGNOSIS — Z79899 Other long term (current) drug therapy: Secondary | ICD-10-CM | POA: Diagnosis not present

## 2017-09-29 MED ORDER — DICLOFENAC SODIUM 75 MG PO TBEC
75.0000 mg | DELAYED_RELEASE_TABLET | Freq: Two times a day (BID) | ORAL | 0 refills | Status: DC
Start: 2017-09-29 — End: 2017-11-11

## 2017-09-29 MED ORDER — METHOCARBAMOL 500 MG PO TABS: 500.0000 mg | ORAL_TABLET | Freq: Two times a day (BID) | ORAL | 0 refills | Status: DC

## 2017-09-29 NOTE — Discharge Instructions (Signed)
Return if any problems.
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
## 2017-09-30 ENCOUNTER — Telehealth: Payer: Self-pay | Admitting: Family Medicine

## 2017-09-30 ENCOUNTER — Other Ambulatory Visit: Payer: Self-pay | Admitting: Family Medicine

## 2017-09-30 MED ORDER — IBUPROFEN 600 MG PO TABS: 600.0000 mg | ORAL_TABLET | Freq: Three times a day (TID) | ORAL | 0 refills | Status: DC | PRN

## 2017-09-30 MED ORDER — RANITIDINE HCL 300 MG PO TABS: 300.0000 mg | ORAL_TABLET | Freq: Every day | ORAL | 0 refills | Status: DC

## 2017-09-30 MED ORDER — CYCLOBENZAPRINE HCL 10 MG PO TABS: ORAL_TABLET | ORAL | 0 refills | Status: DC

## 2017-09-30 NOTE — Telephone Encounter (Signed)
Soke with pt and she aware that meds are sent
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
## 2017-10-22 ENCOUNTER — Other Ambulatory Visit: Payer: Self-pay | Admitting: Family Medicine

## 2017-10-30 ENCOUNTER — Other Ambulatory Visit: Payer: Self-pay | Admitting: Family Medicine

## 2017-10-31 ENCOUNTER — Other Ambulatory Visit: Payer: Self-pay | Admitting: Family Medicine

## 2017-11-11 ENCOUNTER — Encounter: Payer: Self-pay | Admitting: Family Medicine

## 2017-11-11 ENCOUNTER — Ambulatory Visit (INDEPENDENT_AMBULATORY_CARE_PROVIDER_SITE_OTHER): Payer: Commercial Managed Care - PPO | Admitting: Family Medicine

## 2017-11-11 VITALS — BP 122/82 | HR 82 | Resp 16 | Ht 66.5 in | Wt 175.0 lb

## 2017-11-11 DIAGNOSIS — E041 Nontoxic single thyroid nodule: Secondary | ICD-10-CM | POA: Diagnosis not present

## 2017-11-11 DIAGNOSIS — E663 Overweight: Secondary | ICD-10-CM

## 2017-11-11 DIAGNOSIS — M549 Dorsalgia, unspecified: Secondary | ICD-10-CM

## 2017-11-11 DIAGNOSIS — I10 Essential (primary) hypertension: Secondary | ICD-10-CM

## 2017-11-11 MED ORDER — CETIRIZINE HCL 10 MG PO TABS: 10.0000 mg | ORAL_TABLET | Freq: Every day | ORAL | 1 refills | Status: DC

## 2017-11-11 MED ORDER — DILTIAZEM HCL ER COATED BEADS 120 MG PO CP24: 120.0000 mg | ORAL_CAPSULE | Freq: Every day | ORAL | 3 refills | Status: DC
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
## 2017-11-13 ENCOUNTER — Encounter: Payer: Self-pay | Admitting: Family Medicine

## 2017-11-13 NOTE — Assessment & Plan Note (Signed)
C/o ongoing lower back, right thigh and left foot and ankle discomfort/ tingling intermittently,, refer to ortho for ongoing management

## 2017-11-13 NOTE — Assessment & Plan Note (Signed)
Controlled, no change in medication  

## 2017-11-13 NOTE — Assessment & Plan Note (Addendum)
[name] and [name] of gland  and refer to endo for follow up

## 2017-11-13 NOTE — Assessment & Plan Note (Signed)
Controlled, no change in medication DASH diet and commitment to daily physical activity for a minimum of 30 minutes discussed and encouraged, as a part of hypertension management. The importance of attaining a healthy weight is also discussed.  BP/Weight 11/11/2017 09/29/2017 07/31/2017 04/03/2017 02/12/2017 08/14/2016 26/33/3545  Systolic BP 625 638 937 342 876 811 572  Diastolic BP 82 82 84 78 80 80 82  Wt. (Lbs) 175 160 156 172 172.75 176 167  BMI 27.82 25.82 25.18 27.35 27.88 28.41 26.95

## 2017-11-13 NOTE — Assessment & Plan Note (Signed)
Deteriorated. Patient re-educated about  the importance of commitment to a  minimum of 150 minutes of exercise per week.  The importance of healthy food choices with portion control discussed. Encouraged to start a food diary, count calories and to consider  joining a support group. Sample diet sheets offered. Goals set by the patient for the next several months.   Weight /BMI 11/11/2017 09/29/2017 07/31/2017  WEIGHT 175 lb 160 lb 156 lb  HEIGHT 5' 6.5" 5\' 6"  5\' 6"   BMI 27.82 kg/m2 25.82 kg/m2 25.18 kg/m2

## 2017-11-17 ENCOUNTER — Other Ambulatory Visit: Payer: Self-pay | Admitting: Family Medicine

## 2017-11-17 DIAGNOSIS — E041 Nontoxic single thyroid nodule: Secondary | ICD-10-CM

## 2017-11-20 ENCOUNTER — Other Ambulatory Visit: Payer: Self-pay | Admitting: Family Medicine

## 2017-11-20 ENCOUNTER — Ambulatory Visit (HOSPITAL_COMMUNITY): Payer: Commercial Managed Care - PPO

## 2017-12-22 ENCOUNTER — Ambulatory Visit: Payer: 59 | Admitting: "Endocrinology

## 2017-12-23 ENCOUNTER — Telehealth: Payer: Self-pay | Admitting: Obstetrics & Gynecology

## 2017-12-23 ENCOUNTER — Ambulatory Visit: Payer: 59 | Admitting: Adult Health

## 2017-12-23 ENCOUNTER — Telehealth: Payer: Self-pay | Admitting: Family Medicine

## 2017-12-23 NOTE — Telephone Encounter (Signed)
Spoke with pt. Pt has a slight discharge with no odor. + itching x 1 week. Had 1 Diflucan and it eased it up but not gone. Pt wonders if she has BV. I advised she needs an appt. Pt voiced understanding and call was transferred to front desk for appt. Dulce

## 2017-12-23 NOTE — Telephone Encounter (Signed)
Patient is requesting a medication

## 2017-12-23 NOTE — Telephone Encounter (Signed)
Patient is requesting a medication for sinus problems, has headache, pressure in her head, and stopped up nose. Cb#: [phone] Pharmacy: [facility]

## 2017-12-24 ENCOUNTER — Other Ambulatory Visit: Payer: Self-pay | Admitting: Family Medicine

## 2017-12-24 MED ORDER — CHLORPHENIRAMINE MALEATE 4 MG PO TABS: 4.0000 mg | ORAL_TABLET | Freq: Two times a day (BID) | ORAL | 0 refills | Status: DC | PRN

## 2017-12-24 NOTE — Telephone Encounter (Signed)
Spoke with patient and advised her of Dr.Simpson's recommendations with verbal understanding.

## 2017-12-24 NOTE — Telephone Encounter (Signed)
pls advise saline flushes to her nostrils two to 3 times daily for stopped up nose and OTC chlorpheniramine tablets one daily, I will also send a prescription for the chlorpheniramine to her pharmacy

## 2017-12-29 ENCOUNTER — Ambulatory Visit (INDEPENDENT_AMBULATORY_CARE_PROVIDER_SITE_OTHER): Payer: Commercial Managed Care - PPO | Admitting: Adult Health

## 2017-12-29 ENCOUNTER — Encounter: Payer: Self-pay | Admitting: Adult Health

## 2017-12-29 VITALS — BP 124/73 | HR 60 | Temp 98.5°F | Ht 66.0 in | Wt 171.0 lb

## 2017-12-29 DIAGNOSIS — B9689 Other specified bacterial agents as the cause of diseases classified elsewhere: Secondary | ICD-10-CM

## 2017-12-29 DIAGNOSIS — N898 Other specified noninflammatory disorders of vagina: Secondary | ICD-10-CM

## 2017-12-29 DIAGNOSIS — J0110 Acute frontal sinusitis, unspecified: Secondary | ICD-10-CM

## 2017-12-29 DIAGNOSIS — N644 Mastodynia: Secondary | ICD-10-CM

## 2017-12-29 DIAGNOSIS — N760 Acute vaginitis: Secondary | ICD-10-CM

## 2017-12-29 DIAGNOSIS — R350 Frequency of micturition: Secondary | ICD-10-CM

## 2017-12-29 DIAGNOSIS — R809 Proteinuria, unspecified: Secondary | ICD-10-CM | POA: Diagnosis not present

## 2017-12-29 DIAGNOSIS — R319 Hematuria, unspecified: Secondary | ICD-10-CM

## 2017-12-29 DIAGNOSIS — N920 Excessive and frequent menstruation with regular cycle: Secondary | ICD-10-CM | POA: Diagnosis not present

## 2017-12-29 LAB — POCT URINALYSIS DIPSTICK
GLUCOSE UA: NEGATIVE
Leukocytes, UA: NEGATIVE
Nitrite, UA: NEGATIVE
Protein, UA: POSITIVE — AB

## 2017-12-29 LAB — POCT WET PREP (WET MOUNT)
Clue Cells Wet Prep Whiff POC: POSITIVE
WBC WET PREP: POSITIVE

## 2017-12-29 MED ORDER — AZITHROMYCIN 250 MG PO TABS: ORAL_TABLET | ORAL | 0 refills | Status: DC

## 2017-12-29 MED ORDER — METRONIDAZOLE 500 MG PO TABS: 500.0000 mg | ORAL_TABLET | Freq: Two times a day (BID) | ORAL | 0 refills | Status: DC

## 2017-12-29 MED ORDER — FLUCONAZOLE 150 MG PO TABS
ORAL_TABLET | ORAL | 1 refills | Status: DC
Start: 2017-12-29 — End: 2018-01-28

## 2017-12-29 NOTE — Progress Notes (Signed)
Patient ID: [name], female   DOB: 1970/09/18, 47 y.o.   MRN: [id number] History of Present Illness: [name] is a 47 year old black female, married in complaining of left breast tenderness,urinary frequency, vaginal discharge, spotting, headache with yellow drainage.  Current Medications, Allergies, Past Medical History, Past Surgical History, Family History and Social History were reviewed in [employer] record.    Review of Systems: +Left breast tenderness +Urinary frequency +Vaginal discharge +Spotting +headache, with yellow drainage  Physical Exam:BP 124/73 (BP Location: Left Arm, Patient Position: Sitting, Cuff Size: Normal)   Pulse 60   Temp 98.5 F (36.9 C)   Ht 5\' 6"  (1.676 m)   Wt 171 lb (77.6 kg)   BMI 27.60 kg/m urine trace protein,blood and ketones  General:  Well developed, well nourished, no acute distress Skin:  Warm and dry,has +frontal and ethmoid tenderness Neck:  Midline trachea, normal thyroid, good ROM, no lymphadenopathy Lungs; Clear to auscultation bilaterally Breast:  No dominant palpable mass, retraction, or nipple discharge Cardiovascular: Regular rate and rhythm Pelvic:  External genitalia is normal in appearance, no lesions.  The vagina is normal in appearance,has pinkish tan discharge with slight odor. Urethra has no lesions or masses. The cervix is bulbous.  Uterus is felt to be normal size, shape, and contour.  No adnexal masses or tenderness noted.Bladder is non tender, no masses felt.Wet prep:+WBC and clue cells   Impression:  1. BV (bacterial vaginosis)   2. Vaginal discharge   3. Spotting   4. Urinary frequency   5. Breast tenderness   6. Subacute frontal sinusitis   7. Proteinuria, unspecified type   8. Hematuria, unspecified type      Plan:  Meds ordered this encounter  Medications  . azithromycin (ZITHROMAX) 250 MG tablet    Sig: Take 2 now and then 1 daily    Dispense:  6 tablet    Refill:  0    Order  Specific Question:   Supervising Provider    Answer:   Elonda Husky, [name] [[id number]]  . metroNIDAZOLE (FLAGYL) 500 MG tablet    Sig: Take 1 tablet (500 mg total) by mouth 2 (two) times daily.    Dispense:  14 tablet    Refill:  0    Order Specific Question:   Supervising Provider    Answer:   Elonda Husky, [name] [[id number]]  . fluconazole (DIFLUCAN) 150 MG tablet    Sig: Take 1 now and repeat 1 in 3 days    Dispense:  2 tablet    Refill:  1    Order Specific Question:   Supervising Provider    Answer:   Tania Ade H [[id number]]  Return in 1 month for pap and physical  Increase fluids, no alcohol

## 2018-01-01 ENCOUNTER — Telehealth: Payer: Self-pay | Admitting: Adult Health

## 2018-01-01 MED ORDER — METRONIDAZOLE 0.75 % VA GEL: 1.0000 | Freq: Every day | VAGINAL | 0 refills | Status: DC

## 2018-01-01 NOTE — Telephone Encounter (Signed)
Pt called back requesting metrogel. Cannot tolerate flagyl.

## 2018-01-01 NOTE — Telephone Encounter (Signed)
Pt does not like flagyl wants metrogel, will rx metrogel

## 2018-01-01 NOTE — Telephone Encounter (Signed)
Called patient back and left message that I needed to know which medication she needed a cream form of.

## 2018-01-01 NOTE — Telephone Encounter (Signed)
Needs to talk to [facility] about the meds called in she cant take needs to see if she can get the cream instead

## 2018-01-17 ENCOUNTER — Other Ambulatory Visit: Payer: Self-pay | Admitting: Family Medicine

## 2018-01-23 ENCOUNTER — Other Ambulatory Visit: Payer: Self-pay | Admitting: Family Medicine

## 2018-01-28 ENCOUNTER — Other Ambulatory Visit: Payer: Self-pay

## 2018-01-28 ENCOUNTER — Other Ambulatory Visit (HOSPITAL_COMMUNITY)
Admission: RE | Admit: 2018-01-28 | Discharge: 2018-01-28 | Disposition: A | Discharge status: Home / Self Care | Payer: Commercial Managed Care - PPO | Source: Ambulatory Visit | Attending: Adult Health | Admitting: Adult Health

## 2018-01-28 ENCOUNTER — Encounter: Payer: Self-pay | Admitting: Adult Health

## 2018-01-28 ENCOUNTER — Ambulatory Visit (INDEPENDENT_AMBULATORY_CARE_PROVIDER_SITE_OTHER): Payer: Commercial Managed Care - PPO | Admitting: Adult Health

## 2018-01-28 VITALS — BP 122/76 | HR 56 | Resp 18 | Ht 66.5 in | Wt 171.0 lb

## 2018-01-28 DIAGNOSIS — Z1212 Encounter for screening for malignant neoplasm of rectum: Secondary | ICD-10-CM

## 2018-01-28 DIAGNOSIS — Z113 Encounter for screening for infections with a predominantly sexual mode of transmission: Secondary | ICD-10-CM | POA: Insufficient documentation

## 2018-01-28 DIAGNOSIS — Z1211 Encounter for screening for malignant neoplasm of colon: Secondary | ICD-10-CM | POA: Insufficient documentation

## 2018-01-28 DIAGNOSIS — Z01419 Encounter for gynecological examination (general) (routine) without abnormal findings: Secondary | ICD-10-CM | POA: Diagnosis present

## 2018-01-28 DIAGNOSIS — Z3009 Encounter for other general counseling and advice on contraception: Secondary | ICD-10-CM | POA: Insufficient documentation

## 2018-01-28 DIAGNOSIS — E041 Nontoxic single thyroid nodule: Secondary | ICD-10-CM

## 2018-01-28 LAB — HEMOCCULT GUIAC POC 1CARD (OFFICE): Fecal Occult Blood, POC: NEGATIVE

## 2018-01-28 NOTE — Progress Notes (Signed)
Patient ID: [name], female   DOB: 1971-04-14, 47 y.o.   MRN: [id number] History of Present Illness:  [name] is a 47 year old black female,married, in for a well woman gyn exam and pap.She has family planning medicaid too. PCP is Dr Moshe Cipro. Seeing Dr Dorris Fetch for thyroid.  Current Medications, Allergies, Past Medical History, Past Surgical History, Family History and Social History were reviewed in [employer] record.     Review of Systems: Patient denies any headaches, hearing loss, fatigue, blurred vision, shortness of breath, chest pain, abdominal pain, problems with bowel movements, urination, or intercourse. No joint pain or mood swings. Spots some most months and occasional cramps is sp ablation     Physical Exam:BP 122/76 (BP Location: Right Arm, Patient Position: Sitting, Cuff Size: Normal)   Pulse (!) 56   Resp 18   Ht 5' 6.5" (1.689 m)   Wt 171 lb (77.6 kg)   LMP 12/28/2017   BMI 27.19 kg/m  General:  Well developed, well nourished, no acute distress Skin:  Warm and dry Neck:  Midline trachea, + thyroid nodules, good ROM, no lymphadenopathy Lungs; Clear to auscultation bilaterally Breast:  No dominant palpable mass, retraction, or nipple discharge Cardiovascular: Regular rate and rhythm Abdomen:  Soft, non tender, no hepatosplenomegaly Pelvic:  External genitalia is normal in appearance, no lesions.  The vagina is normal in appearance,spotting.Marland Kitchen Urethra has no lesions or masses. The cervix is bulbous. Pap with HPV and GC/CHL performed. Uterus is felt to be normal size, shape, and contour.  No adnexal masses or tenderness noted.Bladder is non tender, no masses felt. Rectal: Good sphincter tone, no polyps, or hemorrhoids felt.  Hemoccult negative. Extremities/musculoskeletal:  No swelling or varicosities noted, no clubbing or cyanosis Psych:  No mood changes, alert and cooperative,seems happy PHQ 2 score 0.  Impression: 1. Encounter for gynecological  examination with Papanicolaou smear of cervix   2. Family planning   3. Screening examination for STD (sexually transmitted disease)   4. Thyroid nodule   5. Screening for colorectal cancer       Plan: Physical in 1 year Pap in 3 if normal Check HIV and RPR Mammogram yearly  Labs with PCP

## 2018-01-29 LAB — RPR: RPR: NONREACTIVE

## 2018-01-29 LAB — CYTOLOGY - PAP
Chlamydia: NEGATIVE
Diagnosis: NEGATIVE
HPV: NOT DETECTED
NEISSERIA GONORRHEA: NEGATIVE

## 2018-01-29 LAB — HIV ANTIBODY (ROUTINE TESTING W REFLEX): HIV SCREEN 4TH GENERATION: NONREACTIVE

## 2018-02-01 ENCOUNTER — Telehealth: Payer: Self-pay | Admitting: *Deleted

## 2018-02-01 NOTE — Telephone Encounter (Signed)
Informed patient informed all testing was negative.

## 2018-02-04 ENCOUNTER — Encounter: Payer: Self-pay | Admitting: "Endocrinology

## 2018-02-04 ENCOUNTER — Ambulatory Visit (INDEPENDENT_AMBULATORY_CARE_PROVIDER_SITE_OTHER): Payer: Commercial Managed Care - PPO | Admitting: "Endocrinology

## 2018-02-04 VITALS — BP 117/75 | HR 54 | Ht 66.0 in | Wt 172.2 lb

## 2018-02-04 DIAGNOSIS — E042 Nontoxic multinodular goiter: Secondary | ICD-10-CM | POA: Diagnosis not present

## 2018-02-04 NOTE — Progress Notes (Signed)
Endocrinology Consult Note                                            02/04/2018, 4:46 PM   Subjective:    Patient ID: [name], female    DOB: 1971/01/17, PCP Fayrene Helper, MD   Past Medical History:  Diagnosis Date  . Back pain   . BV (bacterial vaginosis) 09/16/2013  . Chest pain 2000   right   . Contraceptive management 09/16/2013  . Encephalocele ([place]) 1994  . Enlarged thyroid   . HSV-2 (herpes simplex virus 2) infection   . Hypertension   . Irregular intermenstrual bleeding 04/27/2015  . Obesity   . Preterm labor   . Sleep disorder   . Thyroid nodule   . Urinary frequency 12/14/2014  . Vaginal discharge 09/16/2013   Past Surgical History:  Procedure Laterality Date  . BREAST BIOPSY  2007  . [place]  . DILITATION & CURRETTAGE/HYSTROSCOPY WITH NOVASURE ABLATION N/A 05/06/2016   Procedure: DILATATION & CURETTAGE/HYSTEROSCOPY WITH NOVASURE ABLATION;  Surgeon: Jonnie Kind, MD;  Location: [facility];  Service: Gynecology;  Laterality: N/A;  length-5; width-2.6; power-72 ; time- 1 minute 42 seconds  . LAPAROSCOPIC BILATERAL SALPINGECTOMY Bilateral 05/06/2016   Procedure: LAPAROSCOPIC BILATERAL SALPINGECTOMY;  Surgeon: Jonnie Kind, MD;  Location: [facility];  Service: Gynecology;  Laterality: Bilateral;  . RETAINED PLACENTA REMOVAL    . TUBAL LIGATION     04/2016  . tubal ligation reversal  2001  . WISDOM TOOTH EXTRACTION     Social History   Socioeconomic History  . Marital status: Married    Spouse name: Not on file  . Number of children: 4  . Years of education: Not on file  . Highest education level: Not on file  Occupational History  . Not on file  Social Needs  . Financial resource strain: Not on file  . Food insecurity:    Worry: Not on file    Inability: Not on file  . Transportation needs:    Medical: Not on file    Non-medical: Not on file  Tobacco Use  . Smoking status: Never Smoker  . Smokeless tobacco: Never Used  Substance and  Sexual Activity  . Alcohol use: No  . Drug use: No  . Sexual activity: Yes    Partners: Male    Birth control/protection: Surgical    Comment: tubal and ablation  Lifestyle  . Physical activity:    Days per week: Not on file    Minutes per session: Not on file  . Stress: Not on file  Relationships  . Social connections:    Talks on phone: Not on file    Gets together: Not on file    Attends religious service: Not on file    Active member of club or organization: Not on file    Attends meetings of clubs or organizations: Not on file    Relationship status: Not on file  Other Topics Concern  . Not on file  Social History Narrative  . Not on file   Outpatient Encounter Medications as of 02/04/2018  Medication Sig  . diltiazem (CARDIZEM CD) 120 MG 24 hr capsule TAKE ONE CAPSULE BY MOUTH EVERY DAY (AVOID changing brand FOR pt- [facility])  . hydrochlorothiazide (HYDRODIURIL) 25 MG tablet TAKE 1 TABLET BY MOUTH  EVERY DAY  . KLOR-CON M20 20 MEQ tablet TAKE 2 TABLETS BY MOUTH 2 TIMES DAILY.  . metoprolol tartrate (LOPRESSOR) 25 MG tablet TAKE 1 TABLET BY MOUTH EVERY MORNING, THEN 1/2 TABLET AT BEDTIME.  . [DISCONTINUED] potassium chloride (KLOR-CON) 20 MEQ packet Take 40 mEq by mouth 3 (three) times daily.   No facility-administered encounter medications on file as of 02/04/2018.    ALLERGIES: Allergies  Allergen Reactions  . Biaxin [Clarithromycin] Diarrhea, Nausea And Vomiting and Other (See Comments)    Blood in stool  . Yellow Jacket Venom [Bee Venom] Swelling    VACCINATION STATUS: Immunization History  Administered Date(s) Administered  . Influenza,inj,Quad PF,6+ Mos 08/31/2014  . Td 12/18/2010  . Tdap 07/07/2002    HPI Jamayia Croker is 47 y.o. female who presents today with a medical history as above. she is being seen in consultation for multinodular goiter requested by Fayrene Helper, MD.  -She is known to have nodular goiter from 2014 when her ultrasound showed 0.6 cm  nodule on the right lobe of her thyroid.  She did not require intervention at that time.  Repeat thyroid ultrasound in October 2018 showed more nodules in her thyroid.  0.9 cm nodule on the upper part of right lobe and 0.8 cm nodule on the lower part of left lobe along with 0.7 cm nodule (previously 0.6 cm) on the lower part of right lobe. -She denies dysphagia, odynophagia, nor voice change. Denies family history of thyroid malignancy. -She does not have recent thyroid function test to review. -She reports consistent body weight, no palpitations, tremors, nor anxiety. -She denies exposure to neck radiation.  Review of Systems  Constitutional: no weight gain/loss, no fatigue, no subjective hyperthermia, no subjective hypothermia Eyes: no blurry vision, no xerophthalmia ENT: no sore throat, no nodules palpated in throat, no dysphagia/odynophagia, no hoarseness Cardiovascular: no Chest Pain, no Shortness of Breath, no palpitations, no leg swelling Respiratory: no cough, no SOB Gastrointestinal: no Nausea/Vomiting/Diarhhea Musculoskeletal: no muscle/joint aches Skin: no rashes Neurological: no tremors, no numbness, no tingling, no dizziness Psychiatric: no depression, no anxiety  Objective:    BP 117/75   Pulse (!) 54   Ht 5\' 6"  (1.676 m)   Wt 172 lb 4 oz (78.1 kg)   SpO2 99%   BMI 27.80 kg/m   Wt Readings from Last 3 Encounters:  02/04/18 172 lb 4 oz (78.1 kg)  01/28/18 171 lb (77.6 kg)  12/29/17 171 lb (77.6 kg)    Physical Exam  Constitutional: + Over weight for height, not in acute distress, normal state of mind Eyes: PERRLA, EOMI, no exophthalmos ENT: moist mucous membranes, + palpable thyroid with mild enlargement , no cervical lymphadenopathy Cardiovascular: normal precordial activity, Regular Rate and Rhythm, no Murmur/Rubs/Gallops Respiratory:  adequate breathing efforts, no gross chest deformity, Clear to auscultation bilaterally Gastrointestinal: abdomen soft, Non  -tender, No distension, Bowel Sounds present Musculoskeletal: no gross deformities, strength intact in all four extremities Skin: moist, warm, no rashes Neurological: + Mild tremor of outstretched hands , Deep tendon reflexes normal in all four extremities.  CMP ( most recent) CMP     Component Value Date/Time   NA 141 05/21/2016 1022   K 3.0 (L) 05/21/2016 1022   CL 103 05/21/2016 1022   CO2 31 05/21/2016 1022   GLUCOSE 93 05/21/2016 1022   BUN 9 05/21/2016 1022   CREATININE 1.06 05/21/2016 1022   CALCIUM 9.2 05/21/2016 1022   PROT 7.7 04/23/2016 1505   ALBUMIN  4.1 04/23/2016 1505   AST 18 04/23/2016 1505   ALT 17 04/23/2016 1505   ALKPHOS 102 04/23/2016 1505   BILITOT 0.3 04/23/2016 1505   GFRNONAA >60 05/05/2016 0953   GFRAA >60 05/05/2016 0953     Diabetic Labs (most recent): Lab Results  Component Value Date   HGBA1C 5.9 (H) 05/17/2014   HGBA1C 5.6 07/23/2013   HGBA1C 5.9 (H) 07/27/2012     Lipid Panel ( most recent) Lipid Panel     Component Value Date/Time   CHOL 130 07/23/2013 0944   TRIG 67 07/23/2013 0944   HDL 38 (L) 07/23/2013 0944   CHOLHDL 3.4 07/23/2013 0944   VLDL 13 07/23/2013 0944   LDLCALC 79 07/23/2013 0944      Lab Results  Component Value Date   TSH 1.59 02/04/2018   TSH 1.957 02/28/2009   TSH 1.706 07/21/2007   TSH 1.193 08/25/2006   FREET4 1.0 02/04/2018     Assessment & Plan:   1. Multinodular goiter  - [name]  is being seen at a kind request of Fayrene Helper, MD. - I have reviewed her available thyroid records and clinically evaluated the patient. - Based on reviews, she has stable multinodular goiter with none of the nodules greater than 1 cm, nor with worrisome radiologic features.  She would not require fine-needle aspiration nor intervention at this time.   -She will need thyroid ultrasound to monitor nodular size in 1-2 years. -However, she will be considered for full profile thyroid function tests today and  she will return in 2 weeks for reevaluation.    - I did not initiate any new prescriptions today. - I advised her  to maintain close follow up with Fayrene Helper, MD for primary care needs.  Follow up plan: Return in about 2 weeks (around 02/18/2018) for Labs Today.   Glade Lloyd, MD [facility] [address] [place], [place] [address] Phone: [phone]  Fax: [phone]     02/04/2018, 4:46 PM  This note was partially dictated with voice recognition software. Similar sounding words can be transcribed inadequately or may not  be corrected upon review.

## 2018-02-05 LAB — T4, FREE: FREE T4: 1 ng/dL (ref 0.8–1.8)

## 2018-02-05 LAB — TSH: TSH: 1.59 mIU/L

## 2018-02-05 LAB — THYROID PEROXIDASE ANTIBODY: Thyroperoxidase Ab SerPl-aCnc: 4 IU/mL (ref <9)

## 2018-02-05 LAB — THYROGLOBULIN ANTIBODY: Thyroglobulin Ab: <1 IU/mL (ref <=1)

## 2018-02-05 LAB — T3, FREE: T3 FREE: 2.8 pg/mL (ref 2.3–4.2)

## 2018-02-18 ENCOUNTER — Ambulatory Visit: Payer: Commercial Managed Care - PPO | Admitting: "Endocrinology

## 2018-02-24 ENCOUNTER — Other Ambulatory Visit: Payer: Self-pay | Admitting: Family Medicine

## 2018-03-01 ENCOUNTER — Other Ambulatory Visit: Payer: Self-pay | Admitting: Family Medicine

## 2018-03-01 DIAGNOSIS — Z1231 Encounter for screening mammogram for malignant neoplasm of breast: Secondary | ICD-10-CM

## 2018-03-22 ENCOUNTER — Other Ambulatory Visit: Payer: Self-pay | Admitting: Family Medicine

## 2018-04-19 ENCOUNTER — Ambulatory Visit (HOSPITAL_COMMUNITY)
Admission: RE | Admit: 2018-04-19 | Discharge: 2018-04-19 | Disposition: A | Discharge status: Home / Self Care | Payer: Commercial Managed Care - PPO | Source: Ambulatory Visit | Attending: Family Medicine | Admitting: Family Medicine

## 2018-04-19 ENCOUNTER — Encounter (HOSPITAL_COMMUNITY): Payer: Self-pay

## 2018-04-19 DIAGNOSIS — Z1231 Encounter for screening mammogram for malignant neoplasm of breast: Secondary | ICD-10-CM | POA: Diagnosis present

## 2018-04-21 ENCOUNTER — Telehealth: Payer: Self-pay | Admitting: Family Medicine

## 2018-04-21 NOTE — Telephone Encounter (Signed)
Pt is calling questioning Mammogram results. Please call and update

## 2018-04-21 NOTE — Telephone Encounter (Signed)
Please let her know that the mammogram report is normal and that her repeat mammogram is due in 1 year. It does say she has dense breast tissue but the final eval by radiologist is as stated Pls also let he k now she needs to sched an appt, none in system , has not been in 1 year, needs and please order CBVC, lipid, cmp and EGFR and vit D needs these 1 week before her appt so they are available to discuss at visit. Please arrange Thanks

## 2018-04-23 ENCOUNTER — Telehealth: Payer: Self-pay

## 2018-04-23 DIAGNOSIS — I10 Essential (primary) hypertension: Secondary | ICD-10-CM

## 2018-04-23 DIAGNOSIS — Z13220 Encounter for screening for lipoid disorders: Secondary | ICD-10-CM

## 2018-04-23 DIAGNOSIS — E559 Vitamin D deficiency, unspecified: Secondary | ICD-10-CM

## 2018-04-23 NOTE — Telephone Encounter (Signed)
Patient aware of mammogram results and the need to schedule an appt. She will call office Monday to schedule follow up appt. Labs ordered and mailed to patient.

## 2018-04-23 NOTE — Telephone Encounter (Signed)
Labs ordered per Dr.Simpson. Patient made aware and will call Monday to schedule follow up appt.

## 2018-05-18 ENCOUNTER — Other Ambulatory Visit: Payer: Self-pay | Admitting: Family Medicine

## 2018-06-23 ENCOUNTER — Telehealth: Payer: Self-pay | Admitting: Family Medicine

## 2018-06-23 ENCOUNTER — Telehealth: Payer: Self-pay | Admitting: Adult Health

## 2018-06-23 MED ORDER — FLUCONAZOLE 150 MG PO TABS
ORAL_TABLET | ORAL | 1 refills | Status: DC
Start: 2018-06-23 — End: 2018-07-29

## 2018-06-23 MED ORDER — METRONIDAZOLE 500 MG PO TABS: 500.0000 mg | ORAL_TABLET | Freq: Two times a day (BID) | ORAL | 0 refills | Status: DC

## 2018-06-23 NOTE — Telephone Encounter (Signed)
rx flagyl and diflucan  

## 2018-06-23 NOTE — Telephone Encounter (Signed)
Called pt back and she states she is having her usual "sinus issues" no fever, thick cloudy mucus x 4-5 days. Mucinex not helping.

## 2018-06-23 NOTE — Telephone Encounter (Signed)
I just see whrere she had flagyll and fluconazole prescribed earlier today by gyne, she does NOT need the Z pack as well. She does need the labs and appt, thanks

## 2018-06-23 NOTE — Telephone Encounter (Signed)
Pt calling wanting to speak with a nurse, regarding sinus issues.

## 2018-06-23 NOTE — Telephone Encounter (Signed)
Pt states that she thinks she has bv again. Would like rx sent in and also a diflucan to use afterwards.

## 2018-06-23 NOTE — Telephone Encounter (Signed)
pls let pt know I have prescribed a Zpack ,  ( antibiotic ) Needs to do saline nasal flushes  Dhe needs to schedule anf d keep an appt in the office , has not been in for over 1 year and has no appt  Needs fasting cbc , lipid, chem7  And EGFr asnd TSH 1 week before appt so available WHEN SHE COMES

## 2018-06-23 NOTE — Telephone Encounter (Signed)
Pt has yeast infection wanted to be worked in but did not have anything so she wanted me to see if we can call her in something to [facility]

## 2018-06-24 NOTE — Addendum Note (Signed)
Addended by: Eual Fines on: 06/24/2018 08:30 AM   Modules accepted: Orders

## 2018-06-24 NOTE — Telephone Encounter (Signed)
Pt aware and will get labs done tomorrow- needs follow up appt scheduled

## 2018-06-24 NOTE — Telephone Encounter (Signed)
Pt is on the sch for 1-23

## 2018-06-25 ENCOUNTER — Encounter: Payer: Self-pay | Admitting: Family Medicine

## 2018-06-25 ENCOUNTER — Other Ambulatory Visit: Payer: Self-pay | Admitting: Family Medicine

## 2018-06-25 LAB — COMPLETE METABOLIC PANEL WITH GFR
AG RATIO: 1.4 (calc) (ref 1.0–2.5)
ALT: 11 U/L (ref 6–29)
AST: 13 U/L (ref 10–35)
Albumin: 4.1 g/dL (ref 3.6–5.1)
Alkaline phosphatase (APISO): 87 U/L (ref 33–115)
BILIRUBIN TOTAL: 0.4 mg/dL (ref 0.2–1.2)
BUN: 12 mg/dL (ref 7–25)
CHLORIDE: 100 mmol/L (ref 98–110)
CO2: 33 mmol/L — ABNORMAL HIGH (ref 20–32)
Calcium: 9.6 mg/dL (ref 8.6–10.2)
Creat: 0.96 mg/dL (ref 0.50–1.10)
GFR, EST AFRICAN AMERICAN: 82 mL/min/{1.73_m2} (ref >=60)
GFR, Est Non African American: 71 mL/min/{1.73_m2} (ref >=60)
Globulin: 3 g/dL (calc) (ref 1.9–3.7)
Glucose, Bld: 76 mg/dL (ref 65–99)
POTASSIUM: 2.7 mmol/L — AB (ref 3.5–5.3)
Sodium: 142 mmol/L (ref 135–146)
TOTAL PROTEIN: 7.1 g/dL (ref 6.1–8.1)

## 2018-06-25 LAB — CBC
HCT: 37.5 % (ref 35.0–45.0)
Hemoglobin: 11.8 g/dL (ref 11.7–15.5)
MCH: 22.2 pg — AB (ref 27.0–33.0)
MCHC: 31.5 g/dL — ABNORMAL LOW (ref 32.0–36.0)
MCV: 70.5 fL — AB (ref 80.0–100.0)
MPV: 12.4 fL (ref 7.5–12.5)
PLATELETS: 335 10*3/uL (ref 140–400)
RBC: 5.32 10*6/uL — AB (ref 3.80–5.10)
RDW: 14.9 % (ref 11.0–15.0)
WBC: 8.1 10*3/uL (ref 3.8–10.8)

## 2018-06-25 LAB — TSH: TSH: 1.68 mIU/L

## 2018-06-25 LAB — LIPID PANEL
CHOL/HDL RATIO: 3.7 (calc) (ref <5.0)
CHOLESTEROL: 134 mg/dL (ref <200)
HDL: 36 mg/dL — AB (ref >50)
LDL Cholesterol (Calc): 82 mg/dL (calc)
Non-HDL Cholesterol (Calc): 98 mg/dL (calc) (ref <130)
Triglycerides: 78 mg/dL (ref <150)

## 2018-06-25 MED ORDER — POTASSIUM CHLORIDE CRYS ER 20 MEQ PO TBCR: EXTENDED_RELEASE_TABLET | ORAL | 4 refills | Status: DC

## 2018-06-28 ENCOUNTER — Telehealth: Payer: Self-pay

## 2018-06-28 DIAGNOSIS — E876 Hypokalemia: Secondary | ICD-10-CM

## 2018-06-28 NOTE — Telephone Encounter (Signed)
Lab order mailed to patient to have test repeated on 07/05/18

## 2018-06-28 NOTE — Telephone Encounter (Signed)
-----  Message from Fayrene Helper, MD sent at 06/25/2018  9:05 PM EST ----- Pls see pt message and order repot chem 7 and EGFr to be drawn 07/05/2018 I have called and left her a message to call me back as well as sending pt message, she NEEDS to take the potassium and follow up

## 2018-07-09 ENCOUNTER — Other Ambulatory Visit: Payer: Self-pay

## 2018-07-09 ENCOUNTER — Encounter (HOSPITAL_COMMUNITY): Payer: Self-pay

## 2018-07-09 ENCOUNTER — Telehealth: Payer: Self-pay | Admitting: *Deleted

## 2018-07-09 ENCOUNTER — Emergency Department (HOSPITAL_COMMUNITY)
Admission: EM | Admit: 2018-07-09 | Discharge: 2018-07-09 | Disposition: A | Discharge status: Home / Self Care | Payer: Commercial Managed Care - PPO | Attending: Emergency Medicine | Admitting: Emergency Medicine

## 2018-07-09 DIAGNOSIS — E876 Hypokalemia: Secondary | ICD-10-CM | POA: Insufficient documentation

## 2018-07-09 DIAGNOSIS — R0981 Nasal congestion: Secondary | ICD-10-CM | POA: Diagnosis present

## 2018-07-09 DIAGNOSIS — Z79899 Other long term (current) drug therapy: Secondary | ICD-10-CM | POA: Insufficient documentation

## 2018-07-09 DIAGNOSIS — I10 Essential (primary) hypertension: Secondary | ICD-10-CM | POA: Diagnosis not present

## 2018-07-09 DIAGNOSIS — J0110 Acute frontal sinusitis, unspecified: Secondary | ICD-10-CM

## 2018-07-09 LAB — I-STAT CHEM 8, ED
BUN: 8 mg/dL (ref 6–20)
Calcium, Ion: 1.1 mmol/L — ABNORMAL LOW (ref 1.15–1.40)
Chloride: 98 mmol/L (ref 98–111)
Creatinine, Ser: 0.9 mg/dL (ref 0.44–1.00)
Glucose, Bld: 85 mg/dL (ref 70–99)
HCT: 42 % (ref 36.0–46.0)
Hemoglobin: 14.3 g/dL (ref 12.0–15.0)
Potassium: 2.7 mmol/L — CL (ref 3.5–5.1)
Sodium: 139 mmol/L (ref 135–145)
TCO2: 29 mmol/L (ref 22–32)

## 2018-07-09 LAB — INFLUENZA PANEL BY PCR (TYPE A & B)
Influenza A By PCR: NEGATIVE
Influenza B By PCR: NEGATIVE

## 2018-07-09 MED ORDER — POTASSIUM CHLORIDE 10 MEQ/100ML IV SOLN: 10.0000 meq | Freq: Once | INTRAVENOUS | Status: DC

## 2018-07-09 MED ORDER — AMOXICILLIN-POT CLAVULANATE 875-125 MG PO TABS: 1.0000 | ORAL_TABLET | Freq: Two times a day (BID) | ORAL | 0 refills | Status: DC

## 2018-07-09 MED ORDER — FLUTICASONE PROPIONATE 50 MCG/ACT NA SUSP: 1.0000 | Freq: Every day | NASAL | 0 refills | Status: DC

## 2018-07-09 NOTE — Telephone Encounter (Signed)
Pt called wanting something called in. She is very hoarse has a lot of congestion. She was on metronidazole but she can not take it as it gives her a headache when she take with her blood pressure meds.

## 2018-07-09 NOTE — ED Notes (Signed)
CRITICAL VALUE ALERT  Critical Value: k 2.7  Date & Time Notied: 1209 07/09/18  Provider Notified: j idol Orders Received/Actions taken:

## 2018-07-09 NOTE — ED Triage Notes (Signed)
Pt is having significant congestion as well as being hoarse for the last 3-4 days. States she was around her son who was diagnosed with the flu last yesterday. Pt states she had has fevers and chills.

## 2018-07-09 NOTE — ED Notes (Signed)
Pt refusing IV potassium. Provider notified

## 2018-07-09 NOTE — Telephone Encounter (Signed)
Patient called back stating that the urgent care wouldn't accept her insurance and was wanting something called in. I had seen previously that she was checked in at the ER with a flu panel in process. I explained that the provider was unavailable today and that I could see she was at the ER and she said she was about to leave because she had been there for an hour. I advised her to stay since her flu test was in process and that way could be treated accordingly
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
## 2018-07-09 NOTE — Telephone Encounter (Signed)
Would need evaluation for meds to be sent in. No provider in office today. I see where she has checked into the ER

## 2018-07-29 ENCOUNTER — Encounter: Payer: Self-pay | Admitting: Family Medicine

## 2018-07-29 ENCOUNTER — Ambulatory Visit (INDEPENDENT_AMBULATORY_CARE_PROVIDER_SITE_OTHER): Payer: Commercial Managed Care - PPO | Admitting: Family Medicine

## 2018-07-29 VITALS — BP 94/64 | HR 92 | Resp 16 | Ht 66.0 in | Wt 175.0 lb

## 2018-07-29 DIAGNOSIS — I10 Essential (primary) hypertension: Secondary | ICD-10-CM

## 2018-07-29 DIAGNOSIS — J309 Allergic rhinitis, unspecified: Secondary | ICD-10-CM | POA: Diagnosis not present

## 2018-07-29 DIAGNOSIS — E876 Hypokalemia: Secondary | ICD-10-CM | POA: Diagnosis not present

## 2018-07-29 DIAGNOSIS — E663 Overweight: Secondary | ICD-10-CM | POA: Diagnosis not present

## 2018-07-29 MED ORDER — POTASSIUM CHLORIDE ER 10 MEQ PO TBCR: EXTENDED_RELEASE_TABLET | ORAL | 5 refills | Status: DC

## 2018-07-29 MED ORDER — METOPROLOL TARTRATE 25 MG PO TABS: ORAL_TABLET | ORAL | 3 refills | Status: DC

## 2018-07-29 NOTE — Progress Notes (Signed)
   [name]     MRN: [id number]      DOB: Apr 17, 1971   HPI Ms. [name] is here for follow up and re-evaluation of chronic medical conditions, medication management and review of any available recent lab and radiology data.  Preventive health is updated, specifically  Cancer screening and Immunization.   Questions or concerns regarding consultations or procedures which the2 PT has had in the interim are  addressed. C/o light headedness when she takes her first  BP medication in the morning, and also that the BP is low at that time C/o excessive weight gain, states not exercising as before   ROS Denies recent fever or chills. Denies sinus pressure, nasal congestion, ear pain or sore throat. Denies chest congestion, productive cough or wheezing. Denies chest pains, palpitations and leg swelling Denies abdominal pain, nausea, vomiting,diarrhea or constipation.   Denies dysuria, frequency, hesitancy or incontinence. Denies joint pain, swelling and limitation in mobility. Denies headaches, seizures, numbness, or tingling. Denies depression, anxiety or insomnia. Denies skin break down or rash.   PE  BP 94/64   Pulse 92   Resp 16   Ht 5\' 6"  (1.676 m)   Wt 175 lb (79.4 kg)   SpO2 97%   BMI 28.25 kg/m   Patient alert and oriented and in no cardiopulmonary distress.  HEENT: No facial asymmetry, EOMI,   oropharynx pink and moist.  Neck supple no JVD, no mass.  Chest: Clear to auscultation bilaterally.  CVS: S1, S2 no murmurs, no S3.Regular rate.  ABD: Soft non tender.   Ext: No edema  MS: Adequate ROM spine, shoulders, hips and knees.  Skin: Intact, no ulcerations or rash noted.  Psych: Good eye contact, normal affect. Memory intact not anxious or depressed appearing.  CNS: CN 2-12 intact, power,  normal throughout.no focal deficits noted.   Assessment & Plan  Essential hypertension Over corrected, reduce metoprolol dose DASH diet and commitment to daily physical  activity for a minimum of 30 minutes discussed and encouraged, as a part of hypertension management. The importance of attaining a healthy weight is also discussed.  BP/Weight 07/29/2018 07/09/2018 02/04/2018 01/28/2018 12/29/2017 11/11/2017 7/35/3299  Systolic BP 94 242 683 419 622 297 989  Diastolic BP 64 72 75 76 73 82 82  Wt. (Lbs) 175 157 172.25 171 171 175 160  BMI 28.25 25.34 27.8 27.19 27.6 27.82 25.82       Overweight (BMI 25.0-29.9) Deteriorated. Patient re-educated about  the importance of commitment to a  minimum of 150 minutes of exercise per week.  The importance of healthy food choices with portion control discussed. Encouraged to start a food diary, count calories and to consider  joining a support group. Sample diet sheets offered. Goals set by the patient for the next several months.   Weight /BMI 07/29/2018 07/09/2018 02/04/2018  WEIGHT 175 lb 157 lb 172 lb 4 oz  HEIGHT 5\' 6"  5\' 6"  5\' 6"   BMI 28.25 kg/m2 25.34 kg/m2 27.8 kg/m2      HYPOKALEMIA recurrent problem, non compliant , states has to crush pills since very large and often misses her pm dose Will change to the 10 meq tablet Rept chem 7 prior to next visit  Allergic rhinitis No recent or current flare, uses medication as needed

## 2018-07-29 NOTE — Patient Instructions (Addendum)
F/U in 5 months, call if you need me before  Reduce metoprolol to half tablet two times daily  Change in potassium to 10 meq 4 tablets two times daily  Need to commit to daily exercise at 30 minutes at least 5 days/ week  Stop sodas, and start fruit and vegetable, throughout the day, reduce processed foods  Thanks for choosing [facility], we consider it a privelige to serve you.  Chem 7 and EGFr  1 week before next visit

## 2018-08-01 ENCOUNTER — Encounter: Payer: Self-pay | Admitting: Family Medicine

## 2018-08-01 NOTE — Assessment & Plan Note (Signed)
Deteriorated. Patient re-educated about  the importance of commitment to a  minimum of 150 minutes of exercise per week.  The importance of healthy food choices with portion control discussed. Encouraged to start a food diary, count calories and to consider  joining a support group. Sample diet sheets offered. Goals set by the patient for the next several months.   Weight /BMI 07/29/2018 07/09/2018 02/04/2018  WEIGHT 175 lb 157 lb 172 lb 4 oz  HEIGHT 5\' 6"  5\' 6"  5\' 6"   BMI 28.25 kg/m2 25.34 kg/m2 27.8 kg/m2

## 2018-08-01 NOTE — Assessment & Plan Note (Signed)
recurrent problem, non compliant , states has to crush pills since very large and often misses her pm dose Will change to the 10 meq tablet Rept chem 7 prior to next visit

## 2018-08-01 NOTE — Assessment & Plan Note (Signed)
No recent or current flare, uses medication as needed

## 2018-08-01 NOTE — Assessment & Plan Note (Signed)
Over corrected, reduce metoprolol dose DASH diet and commitment to daily physical activity for a minimum of 30 minutes discussed and encouraged, as a part of hypertension management. The importance of attaining a healthy weight is also discussed.  BP/Weight 07/29/2018 07/09/2018 02/04/2018 01/28/2018 12/29/2017 11/11/2017 3/95/8441  Systolic BP 94 712 787 183 672 550 016  Diastolic BP 64 72 75 76 73 82 82  Wt. (Lbs) 175 157 172.25 171 171 175 160  BMI 28.25 25.34 27.8 27.19 27.6 27.82 25.82

## 2018-08-30 ENCOUNTER — Telehealth: Payer: Self-pay | Admitting: Family Medicine

## 2018-08-30 ENCOUNTER — Other Ambulatory Visit: Payer: Self-pay | Admitting: Family Medicine

## 2018-08-30 MED ORDER — CYCLOBENZAPRINE HCL 10 MG PO TABS: ORAL_TABLET | ORAL | 0 refills | Status: DC

## 2018-08-30 MED ORDER — IBUPROFEN 800 MG PO TABS: 800.0000 mg | ORAL_TABLET | Freq: Three times a day (TID) | ORAL | 0 refills | Status: DC | PRN

## 2018-08-30 NOTE — Telephone Encounter (Signed)
Pt is requesting an appointment, she states that she can barely move--her back is hurting.  Please advise.

## 2018-08-30 NOTE — Telephone Encounter (Signed)
Called patient, no answer, left a message for her to return call

## 2018-08-30 NOTE — Telephone Encounter (Signed)
Spoke with patient, she will pick up medication prescribed, and will let us know if it helps. Told her that if it didn't help to also let us know, and that a gynecologist may be the next step.

## 2018-08-30 NOTE — Progress Notes (Signed)
Ibuprofen 800

## 2018-08-30 NOTE — Telephone Encounter (Signed)
Patient returned call. She is complaining of a dull aching pain across her lower back that radiates down both thighs in the front of her legs. It is a constant pain and hurts when she presses on her back. Pain level is a 7-8. Started 2 days ago. Reports feeling like the pain starts a few days before her period and ends after her period is done. She says this happened last month also. Feels like she can "hardley move"

## 2018-08-30 NOTE — Telephone Encounter (Signed)
pls call and get more details about this back pain, she has no known back problems in her history

## 2018-08-30 NOTE — Telephone Encounter (Signed)
I recommend and have sent in ibuprofen and a muscle relaxer, likely menstriual cramps, may neeed to see Gyne for follow up if no benefit, pls let her know

## 2018-08-30 NOTE — Telephone Encounter (Signed)
Do you want to see her, or maybe schedule her with Jarrett Soho? Please advise

## 2018-08-31 ENCOUNTER — Ambulatory Visit (INDEPENDENT_AMBULATORY_CARE_PROVIDER_SITE_OTHER): Payer: Commercial Managed Care - PPO | Admitting: Women's Health

## 2018-08-31 ENCOUNTER — Encounter: Payer: Self-pay | Admitting: Women's Health

## 2018-08-31 VITALS — BP 122/75 | HR 56 | Ht 66.0 in | Wt 176.0 lb

## 2018-08-31 DIAGNOSIS — M7918 Myalgia, other site: Secondary | ICD-10-CM

## 2018-08-31 DIAGNOSIS — R102 Pelvic and perineal pain: Secondary | ICD-10-CM

## 2018-08-31 DIAGNOSIS — Z86018 Personal history of other benign neoplasm: Secondary | ICD-10-CM | POA: Diagnosis not present

## 2018-08-31 NOTE — Progress Notes (Signed)
   GYN VISIT Patient name: [name] MRN [id number]  Date of birth: Aug 12, 1970 Chief Complaint:   Pelvic Pain (and back pain)  History of Present Illness:   [name] is a 48 y.o. [phone] African American female being seen today for report of sudden onset constant lower back and lower pelvic/abd pain radiating into thighs, began Sunday am as she was getting out of car at church. Has been taking ibuprofen/apap- which puts her to sleep. Called Dr. Moshe Cipro yesterday, she called her in some flexeril. Hasn't picked it up yet. Used heat some. Hurts to move.  Denies abnormal discharge, itching/odor/irritation.  Has had ablation, has period monthly, usually around the 20th, hasn't started yet this month.  Last pelvic u/s 04/10/16: fundal Rt intramural fibroid 0.7x0.5x0.6cm, multiple posterior myometrial calcification, 1.1x0.6x1cm hypoechoic mass w/ a single stalk of color flow in endometrium- c/w polyp  No LMP recorded. Patient has had an ablation. The current method of family planning is tubal ligation and ablation. Last pap 01/28/18. Results were:  neg w/ -HRHPV Review of Systems:   Pertinent items are noted in HPI Denies fever/chills, dizziness, headaches, visual disturbances, fatigue, shortness of breath, chest pain, abdominal pain, vomiting, abnormal vaginal discharge/itching/odor/irritation, problems with periods, bowel movements, urination, or intercourse unless otherwise stated above.  Pertinent History Reviewed:  Reviewed past medical,surgical, social, obstetrical and family history.  Reviewed problem list, medications and allergies. Physical Assessment:   Vitals:   08/31/18 1708  BP: 122/75  Pulse: (!) 56  Weight: 176 lb (79.8 kg)  Height: 5\' 6"  (1.676 m)  Body mass index is 28.41 kg/m.       Physical Examination:   General appearance: alert, well appearing, and in no distress  Mental status: alert, oriented to person, place, and time  Skin: warm & dry   Cardiovascular: normal  heart rate noted  Respiratory: normal respiratory effort, no distress  Back: +tenderness bilateral PSIS  Abdomen: soft, + generalized tenderness, worse on q side, no pain w/ crunch- c/w deep/musculoskeletal pain  Pelvic: VULVA: normal appearing vulva with no masses, tenderness or lesions, VAGINA: normal appearing vagina with normal color and discharge, no lesions, CERVIX: normal appearing cervix without discharge or lesions, UTERUS: uterus is normal size, shape, consistency and +tenderness, ADNEXA: normal adnexa in size, + tenderness bilaterally  Extremities: no edema   No results found for this or any previous visit (from the past 24 hour(s)).  Assessment & Plan:  1) Lower back/pelvic/abd pain> seems to be musculoskeletal, pick up flexeril Dr. Moshe Cipro sent in, continue ibuprofen, alt heat/ice. Will also get pelvic u/s   Meds: No orders of the defined types were placed in this encounter.   Orders Placed This Encounter  Procedures  . US PELVIS (TRANSABDOMINAL ONLY)  . US PELVIS TRANSVANGINAL NON-OB (TV ONLY)    Return for asap pelvic u/s and f/u after.  [place], [facility] 08/31/2018 5:55 PM

## 2018-08-31 NOTE — Patient Instructions (Signed)
Start flexeril Ibuprofen as needed Ice/heat, whichever feels better

## 2018-09-01 ENCOUNTER — Telehealth: Payer: Self-pay | Admitting: Women's Health

## 2018-09-01 NOTE — Telephone Encounter (Signed)
Patient called, she was seen yesterday.  She is in a lot of pain and would like to have her ultrasound moved from our office to the hospital because we do not have anything sooner.  [phone]

## 2018-09-02 ENCOUNTER — Emergency Department (HOSPITAL_COMMUNITY): Payer: Commercial Managed Care - PPO

## 2018-09-02 ENCOUNTER — Emergency Department (HOSPITAL_COMMUNITY)
Admission: EM | Admit: 2018-09-02 | Discharge: 2018-09-02 | Disposition: A | Discharge status: Home / Self Care | Payer: Commercial Managed Care - PPO | Attending: Emergency Medicine | Admitting: Emergency Medicine

## 2018-09-02 ENCOUNTER — Other Ambulatory Visit: Payer: Self-pay

## 2018-09-02 ENCOUNTER — Encounter (HOSPITAL_COMMUNITY): Payer: Self-pay | Admitting: Emergency Medicine

## 2018-09-02 ENCOUNTER — Other Ambulatory Visit: Payer: Self-pay | Admitting: *Deleted

## 2018-09-02 DIAGNOSIS — I10 Essential (primary) hypertension: Secondary | ICD-10-CM | POA: Insufficient documentation

## 2018-09-02 DIAGNOSIS — R102 Pelvic and perineal pain: Secondary | ICD-10-CM

## 2018-09-02 DIAGNOSIS — R1030 Lower abdominal pain, unspecified: Secondary | ICD-10-CM | POA: Diagnosis present

## 2018-09-02 DIAGNOSIS — Z91010 Allergy to peanuts: Secondary | ICD-10-CM | POA: Diagnosis not present

## 2018-09-02 DIAGNOSIS — Z79899 Other long term (current) drug therapy: Secondary | ICD-10-CM | POA: Diagnosis not present

## 2018-09-02 DIAGNOSIS — Z86018 Personal history of other benign neoplasm: Secondary | ICD-10-CM

## 2018-09-02 LAB — CBC
HEMATOCRIT: 41.5 % (ref 36.0–46.0)
Hemoglobin: 12.4 g/dL (ref 12.0–15.0)
MCH: 21.5 pg — ABNORMAL LOW (ref 26.0–34.0)
MCHC: 29.9 g/dL — ABNORMAL LOW (ref 30.0–36.0)
MCV: 71.9 fL — ABNORMAL LOW (ref 80.0–100.0)
Platelets: 263 10*3/uL (ref 150–400)
RBC: 5.77 MIL/uL — ABNORMAL HIGH (ref 3.87–5.11)
RDW: 16.6 % — AB (ref 11.5–15.5)
WBC: 12.3 10*3/uL — ABNORMAL HIGH (ref 4.0–10.5)
nRBC: 0 % (ref 0.0–0.2)

## 2018-09-02 LAB — URINALYSIS, ROUTINE W REFLEX MICROSCOPIC
Bilirubin Urine: NEGATIVE
Glucose, UA: NEGATIVE mg/dL
Hgb urine dipstick: NEGATIVE
KETONES UR: NEGATIVE mg/dL
Leukocytes,Ua: NEGATIVE
Nitrite: NEGATIVE
Protein, ur: NEGATIVE mg/dL
Specific Gravity, Urine: 1.027 (ref 1.005–1.030)
pH: 5 (ref 5.0–8.0)

## 2018-09-02 LAB — COMPREHENSIVE METABOLIC PANEL
ALT: 16 U/L (ref 0–44)
AST: 16 U/L (ref 15–41)
Albumin: 4.5 g/dL (ref 3.5–5.0)
Alkaline Phosphatase: 96 U/L (ref 38–126)
Anion gap: 7 (ref 5–15)
BUN: 14 mg/dL (ref 6–20)
CO2: 26 mmol/L (ref 22–32)
CREATININE: 0.89 mg/dL (ref 0.44–1.00)
Calcium: 9.3 mg/dL (ref 8.9–10.3)
Chloride: 105 mmol/L (ref 98–111)
GFR calc non Af Amer: >60 mL/min (ref >60)
Glucose, Bld: 87 mg/dL (ref 70–99)
Potassium: 3.1 mmol/L — ABNORMAL LOW (ref 3.5–5.1)
Sodium: 138 mmol/L (ref 135–145)
Total Bilirubin: 0.1 mg/dL — ABNORMAL LOW (ref 0.3–1.2)
Total Protein: 8.3 g/dL — ABNORMAL HIGH (ref 6.5–8.1)

## 2018-09-02 LAB — LIPASE, BLOOD: Lipase: 68 U/L — ABNORMAL HIGH (ref 11–51)

## 2018-09-02 MED ORDER — KETOROLAC TROMETHAMINE 30 MG/ML IJ SOLN
15.0000 mg | Freq: Once | INTRAMUSCULAR | Status: DC
  Filled 2018-09-02: qty 1

## 2018-09-02 MED ORDER — ACETAMINOPHEN-CODEINE #3 300-30 MG PO TABS: 1.0000 | ORAL_TABLET | Freq: Once | ORAL | Status: DC

## 2018-09-02 MED ORDER — SODIUM CHLORIDE 0.9% FLUSH
3.0000 mL | Freq: Once | INTRAVENOUS | Status: AC
  Administered 2018-09-02: 3 mL via INTRAVENOUS

## 2018-09-02 MED ORDER — ACETAMINOPHEN 325 MG PO TABS
ORAL_TABLET | ORAL | Status: AC
  Administered 2018-09-02: 325 mg
  Filled 2018-09-02: qty 1

## 2018-09-02 NOTE — ED Triage Notes (Signed)
Lower back pain and lower abd pain since Sunday.  Denies urinary s/s

## 2018-09-02 NOTE — Discharge Instructions (Addendum)
Follow up with Dr. Glo Herring on Monday as planned. Take tylenol and ibuprofen as needed for pain. Return for worsening symptoms.

## 2018-09-02 NOTE — ED Provider Notes (Signed)
[facility] EMERGENCY DEPARTMENT Provider Note   CSN: [id number] Arrival date & time: 09/02/18  1736    History   Chief Complaint Chief Complaint  Patient presents with  . Back Pain    HPI [name] is a 48 y.o. female who presents to the ED with low back pain and low abdominal pain that started 4 days ago. Patient denies UTI symptoms. Patient reports she went to see Dr. Johnnye Sima NP, GYN, 2 days ago and is scheduled for ultrasound on Monday. Patient reports she call the office due to increased pain and was told to come to the ED. Patient has not been taking anything for pain except occasional tylenol because she does not like to take medication.      The history is provided by the patient. No language interpreter was used.  Abdominal Pain  Pain location: lower. Pain quality: aching and bloating   Pain radiates to:  Back Pain severity:  Moderate Onset quality:  Gradual Duration:  4 days Timing:  Constant Progression:  Worsening Chronicity:  New Relieved by:  Nothing Worsened by:  Nothing Ineffective treatments:  Acetaminophen Associated symptoms: nausea   Associated symptoms: no chest pain, no chills, no cough, no dysuria, no fever, no shortness of breath and no vomiting   Risk factors: no NSAID use     Past Medical History:  Diagnosis Date  . Back pain   . BV (bacterial vaginosis) 09/16/2013  . Chest pain 2000   right   . Contraceptive management 09/16/2013  . Encephalocele ([place]) 1994  . Enlarged thyroid   . HSV-2 (herpes simplex virus 2) infection   . Hypertension   . Irregular intermenstrual bleeding 04/27/2015  . Obesity   . Preterm labor   . Sleep disorder   . Thyroid nodule   . Urinary frequency 12/14/2014  . Vaginal discharge 09/16/2013    Patient Active Problem List   Diagnosis Date Noted  . Multinodular goiter 02/04/2018  . Screening for colorectal cancer 01/28/2018  . Thyroid nodule 01/28/2018  . Screening examination for STD (sexually  transmitted disease) 01/28/2018  . Family planning 01/28/2018  . Spotting 04/03/2017  . Protein in urine 04/02/2016  . Multiple allergies 11/28/2015  . Prediabetes 08/01/2012  . Overweight (BMI 25.0-29.9) 08/01/2012  . Vitamin D deficiency 07/28/2012  . Allergic rhinitis 10/16/2009  . HYPOKALEMIA 03/14/2008  . THYROID NODULE 10/26/2007  . Essential hypertension 10/26/2007    Past Surgical History:  Procedure Laterality Date  . BREAST BIOPSY  2007  . [place]  . DILITATION & CURRETTAGE/HYSTROSCOPY WITH NOVASURE ABLATION N/A 05/06/2016   Procedure: DILATATION & CURETTAGE/HYSTEROSCOPY WITH NOVASURE ABLATION;  Surgeon: Jonnie Kind, MD;  Location: [facility];  Service: Gynecology;  Laterality: N/A;  length-5; width-2.6; power-72 ; time- 1 minute 42 seconds  . LAPAROSCOPIC BILATERAL SALPINGECTOMY Bilateral 05/06/2016   Procedure: LAPAROSCOPIC BILATERAL SALPINGECTOMY;  Surgeon: Jonnie Kind, MD;  Location: [facility];  Service: Gynecology;  Laterality: Bilateral;  . RETAINED PLACENTA REMOVAL    . TUBAL LIGATION     04/2016  . tubal ligation reversal  2001  . WISDOM TOOTH EXTRACTION       OB History    Gravida  6   Para  5   Term  2   Preterm  3   AB  1   Living  5     SAB  0   TAB  1   Ectopic  0   Multiple  0  Live Births               Home Medications    Prior to Admission medications   Medication Sig Start Date End Date Taking? Authorizing Provider  cyclobenzaprine (FLEXERIL) 10 MG tablet Take one tablet at bedtime, as needed, for muscle spasm Patient not taking: Reported on 08/31/2018 08/30/18   Fayrene Helper, MD  diltiazem (CARDIZEM CD) 120 MG 24 hr capsule TAKE ONE CAPSULE BY MOUTH EVERY DAY 05/18/18   Fayrene Helper, MD  fluticasone (FLONASE) 50 MCG/ACT nasal spray Place 1 spray into both nostrils daily for 10 days. 07/09/18 07/19/18  Evalee Jefferson, PA-C  hydrochlorothiazide (HYDRODIURIL) 25 MG tablet TAKE 1 TABLET BY MOUTH EVERY DAY 01/25/18    Fayrene Helper, MD  ibuprofen (ADVIL,MOTRIN) 800 MG tablet Take 1 tablet (800 mg total) by mouth every 8 (eight) hours as needed. 08/30/18   Fayrene Helper, MD  metoprolol tartrate (LOPRESSOR) 25 MG tablet Take half tablet two times daily by mouth for blood pressure 07/29/18   Fayrene Helper, MD  potassium chloride (K-DUR) 10 MEQ tablet Take 4 tablets two times daily 07/29/18   Fayrene Helper, MD    Family History Family History  Problem Relation Age of Onset  . Anuerysm Mother   . Stroke Mother   . Cancer Father        thyroid   . Hypertension Father   . Cancer Paternal Grandmother        breast  . Seizures Maternal Grandmother     Social History Social History   Tobacco Use  . Smoking status: Never Smoker  . Smokeless tobacco: Never Used  Substance Use Topics  . Alcohol use: No  . Drug use: No     Allergies   Biaxin [clarithromycin]; Iodinated diagnostic agents; Shellfish allergy; Yellow jacket venom [bee venom]; and Peanut-containing drug products   Review of Systems Review of Systems  Constitutional: Negative for chills and fever.  HENT: Negative.   Eyes: Negative for visual disturbance.  Respiratory: Negative for cough and shortness of breath.   Cardiovascular: Negative for chest pain.  Gastrointestinal: Positive for abdominal pain and nausea. Negative for vomiting.  Genitourinary: Negative for dysuria, frequency and urgency.  Musculoskeletal: Positive for back pain. Negative for myalgias.  Skin: Negative for rash.  Neurological: Negative for light-headedness and headaches.  Psychiatric/Behavioral: Negative for confusion.     Physical Exam Updated Vital Signs BP (!) 152/77 (BP Location: Right Arm)   Pulse 71   Temp 98.6 F (37 C) (Temporal)   Resp 20   Ht 5\' 6"  (1.676 m)   Wt 79.8 kg   LMP 08/04/2018 (Approximate)   SpO2 100%   BMI 28.41 kg/m   Physical Exam Vitals signs and nursing note reviewed.  Constitutional:      General:  She is not in acute distress.    Appearance: She is well-developed.  HENT:     Head: Normocephalic.     Mouth/Throat:     Mouth: Mucous membranes are moist.  Eyes:     Extraocular Movements: Extraocular movements intact.     Conjunctiva/sclera: Conjunctivae normal.  Neck:     Musculoskeletal: Neck supple.  Pulmonary:     Effort: Pulmonary effort is normal.  Abdominal:     Palpations: Abdomen is soft.     Tenderness: There is abdominal tenderness in the right lower quadrant, suprapubic area and left lower quadrant. There is no right CVA tenderness, left CVA tenderness, guarding  or rebound.  Genitourinary:    Comments: Patient recently had exam in GYN office.  Musculoskeletal: Normal range of motion.  Skin:    General: Skin is warm and dry.  Neurological:     Mental Status: She is alert and oriented to person, place, and time.  Psychiatric:        Mood and Affect: Mood normal.    Patient offered Toradol for pain and declined.   ED Treatments / Results  Labs (all labs ordered are listed, but only abnormal results are displayed) Labs Reviewed  LIPASE, BLOOD - Abnormal; Notable for the following components:      Result Value   Lipase 68 (*)    All other components within normal limits  COMPREHENSIVE METABOLIC PANEL - Abnormal; Notable for the following components:   Potassium 3.1 (*)    Total Protein 8.3 (*)    Total Bilirubin 0.1 (*)    All other components within normal limits  CBC - Abnormal; Notable for the following components:   WBC 12.3 (*)    RBC 5.77 (*)    MCV 71.9 (*)    MCH 21.5 (*)    MCHC 29.9 (*)    RDW 16.6 (*)    All other components within normal limits  URINALYSIS, ROUTINE W REFLEX MICROSCOPIC   Radiology Ct Abdomen Pelvis Wo Contrast  Result Date: 09/02/2018 CLINICAL DATA:  48 y/o F; nausea, vomiting, lower back pain, and lower abdominal pain since Sunday. EXAM: CT ABDOMEN AND PELVIS WITHOUT CONTRAST TECHNIQUE: Multidetector CT imaging of the  abdomen and pelvis was performed following the standard protocol without IV contrast. COMPARISON:  08/28/2011 CT abdomen and pelvis. FINDINGS: Lower chest: No acute abnormality. Hepatobiliary: No focal liver abnormality is seen. No gallstones, gallbladder wall thickening, or biliary dilatation. Pancreas: Unremarkable. No pancreatic ductal dilatation or surrounding inflammatory changes. Spleen: Normal in size without focal abnormality. Adrenals/Urinary Tract: Adrenal glands are unremarkable. Kidneys are normal, without renal calculi, focal lesion, or hydronephrosis. Bladder is unremarkable. Stomach/Bowel: Stomach is within normal limits. Appendix appears normal. No evidence of bowel wall thickening, distention, or inflammatory changes. Vascular/Lymphatic: No significant vascular findings are present. No enlarged abdominal or pelvic lymph nodes. Reproductive: Uterus and bilateral adnexa are unremarkable. Other: No abdominal wall hernia or abnormality. No abdominopelvic ascites. Musculoskeletal: No acute or significant osseous findings. IMPRESSION: No acute process identified. Stable negative CT of abdomen and pelvis. Electronically Signed   By: Kristine Garbe M.D.   On: 09/02/2018 20:57    Procedures Procedures (including critical care time)  Medications Ordered in ED Medications  ketorolac (TORADOL) 30 MG/ML injection 15 mg (15 mg Intravenous Refused 09/02/18 2111)  acetaminophen (TYLENOL) 325 MG tablet (has no administration in time range)  acetaminophen-codeine (TYLENOL #3) 300-30 MG per tablet 1 tablet (has no administration in time range)  sodium chloride flush (NS) 0.9 % injection 3 mL (3 mLs Intravenous Given 09/02/18 1906)     Initial Impression / Assessment and Plan / ED Course  I have reviewed the triage vital signs and the nursing notes. 48 y.o. female here with lower abdominal pain that has been persistent x 4 days stable for d/c without fever, normal CT scan and does not appear  toxic. Lab results discussed with the patient.  Patient to take tylenol and ibuprofen as needed for pain and f/u with Dr. Glo Herring as scheduled. Return precautions discussed.   Final Clinical Impressions(s) / ED Diagnoses   Final diagnoses:  Pelvic pain  Lower abdominal pain  ED Discharge Orders    None       Debroah Baller [place], [place] 09/02/18 2126    Milton Ferguson, MD 09/02/18 [phone]

## 2018-09-02 NOTE — Progress Notes (Signed)
Patient requested to be scheduled at [facility] for U/S since she is in pain and can get in sooner there.  Scheduled for 3/2 @ 11:30 [name]. Pt made aware.

## 2018-09-06 ENCOUNTER — Ambulatory Visit (HOSPITAL_COMMUNITY)
Admission: RE | Admit: 2018-09-06 | Discharge: 2018-09-06 | Disposition: A | Discharge status: Home / Self Care | Payer: Commercial Managed Care - PPO | Source: Ambulatory Visit | Attending: Women's Health | Admitting: Women's Health

## 2018-09-06 DIAGNOSIS — R102 Pelvic and perineal pain: Secondary | ICD-10-CM | POA: Diagnosis present

## 2018-09-06 DIAGNOSIS — Z86018 Personal history of other benign neoplasm: Secondary | ICD-10-CM | POA: Insufficient documentation

## 2018-09-07 ENCOUNTER — Telehealth: Payer: Self-pay | Admitting: Women's Health

## 2018-09-07 ENCOUNTER — Telehealth: Payer: Self-pay | Admitting: *Deleted

## 2018-09-07 NOTE — Telephone Encounter (Signed)
Called pt, discussed u/s report. Endometrial polyp stable, actually slightly smaller from 2017. Very small fibroid. LHE also reviewed, no need for f/u, no gyn etiology for her pain. Was most likely musculoskeletal. Pt states pain actually much improved, will cancel f/u appt for 3/5.  Roma Schanz, CNM, [facility] 09/07/2018 10:55 AM

## 2018-09-07 NOTE — Telephone Encounter (Signed)
Patient called requesting her test results. Please advise

## 2018-09-09 ENCOUNTER — Other Ambulatory Visit: Payer: Self-pay | Admitting: Family Medicine

## 2018-09-09 ENCOUNTER — Ambulatory Visit: Payer: Self-pay | Admitting: Women's Health

## 2018-09-09 ENCOUNTER — Other Ambulatory Visit: Payer: Commercial Managed Care - PPO

## 2018-10-25 ENCOUNTER — Ambulatory Visit (INDEPENDENT_AMBULATORY_CARE_PROVIDER_SITE_OTHER): Payer: Commercial Managed Care - PPO | Admitting: Family Medicine

## 2018-10-25 ENCOUNTER — Other Ambulatory Visit: Payer: Self-pay

## 2018-10-25 VITALS — BP 130/70 | Ht 66.0 in | Wt 176.0 lb

## 2018-10-25 DIAGNOSIS — I10 Essential (primary) hypertension: Secondary | ICD-10-CM | POA: Diagnosis not present

## 2018-10-25 DIAGNOSIS — E663 Overweight: Secondary | ICD-10-CM

## 2018-10-25 DIAGNOSIS — M549 Dorsalgia, unspecified: Secondary | ICD-10-CM | POA: Diagnosis not present

## 2018-10-25 MED ORDER — PREDNISONE 10 MG PO TABS: 10.0000 mg | ORAL_TABLET | Freq: Two times a day (BID) | ORAL | 0 refills | Status: DC

## 2018-10-25 MED ORDER — DILTIAZEM HCL ER COATED BEADS 120 MG PO CP24: 120.0000 mg | ORAL_CAPSULE | Freq: Every day | ORAL | 1 refills | Status: DC

## 2018-10-25 MED ORDER — OMEPRAZOLE 20 MG PO CPDR: 20.0000 mg | DELAYED_RELEASE_CAPSULE | Freq: Every day | ORAL | 0 refills | Status: DC

## 2018-10-25 MED ORDER — METOPROLOL TARTRATE 25 MG PO TABS: ORAL_TABLET | ORAL | 3 refills | Status: DC

## 2018-10-25 MED ORDER — IBUPROFEN 600 MG PO TABS: ORAL_TABLET | ORAL | 0 refills | Status: DC

## 2018-10-25 NOTE — Assessment & Plan Note (Signed)
Anti inflammagories prescribed , call back in 10 days if no better

## 2018-10-25 NOTE — Patient Instructions (Addendum)
F/u in June as before , call if you need me sooner.  Three medications are prescribed for your back pain, call or send a message by next week Wednesday if not improved so X ray and PT can be arranged  Social distancing. Frequent hand washing with soap and water Keeping your hands off of your face.Cover face with mask These 3 practices will help to keep both you and your community healthy during this time. Please practice them faithfully!   It is important that you exercise regularly at least 30 minutes 5 times a week. If you develop chest pain, have severe difficulty breathing, or feel very tired, stop exercising immediately and seek medical attention  Think about what you will eat, plan ahead. Choose " clean, green, fresh or frozen" over canned, processed or packaged foods which are more sugary, salty and fatty. 70 to 75% of food eaten should be vegetables and fruit. Three meals at set times with snacks allowed between meals, but they must be fruit or vegetables. Aim to eat over a 12 hour period , example 7 am to 7 pm, and STOP after  your last meal of the day. Drink water,generally about 64 ounces per day, no other drink is as healthy. Fruit juice is best enjoyed in a healthy way, by EATING the fruit.    Thanks for choosing [facility], we consider it a privelige to serve you.

## 2018-10-31 ENCOUNTER — Encounter: Payer: Self-pay | Admitting: Family Medicine

## 2018-10-31 NOTE — Progress Notes (Signed)
Virtual Visit via Telephone Note  I connected with [name] on 10/31/18 at  3:20 PM EDT by telephone and verified that I am speaking with the correct person using two identifiers.   I discussed the limitations, risks, security and privacy concerns of performing an evaluation and management service by telephone and the availability of in person appointments. I also discussed with the patient that there may be a patient responsible charge related to this service. The patient expressed understanding and agreed to proceed. Webex contact is successful Patient is on her job and I am in the office   History of Present Illness:  1 week h/o right leg pain no inciting trauma, has had this in the past spontaneously resolved Denies new weakness, loss of sensation or incontinence. Review of Systems  Constitutional: Negative for chills and fever.  Respiratory: Negative for cough and sputum production.   Cardiovascular: Negative for chest pain and palpitations.  Gastrointestinal: Negative for heartburn.  Genitourinary: Negative for dysuria and frequency.  Musculoskeletal: Positive for back pain.  Neurological: Negative for tingling, focal weakness and weakness.  Psychiatric/Behavioral: Negative for depression.    Observations/Objective:  BP 130/70   Ht 5\' 6"  (1.676 m)   Wt 176 lb (79.8 kg)   BMI 28.41 kg/m   Assessment and Plan: Back pain with radiation Anti inflammagories prescribed , call back in 10 days if no better  Essential hypertension Controlled, no change in medication DASH diet and commitment to daily physical activity for a minimum of 30 minutes discussed and encouraged, as a part of hypertension management. The importance of attaining a healthy weight is also discussed.  BP/Weight 10/25/2018 09/02/2018 08/31/2018 07/29/2018 07/09/2018 02/04/2018 10/18/2438  Systolic BP 102 725 366 94 440 347 425  Diastolic BP 70 956 75 64 72 75 76  Wt. (Lbs) 176 176 176 175 157 172.25 171  BMI  28.41 28.41 28.41 28.25 25.34 27.8 27.19       Overweight (BMI 25.0-29.9)   Patient re-educated about  the importance of commitment to a  minimum of 150 minutes of exercise per week as able.  The importance of healthy food choices with portion control discussed, as well as eating regularly and within a 12 hour window most days. The need to choose "clean , green" food 50 to 75% of the time is discussed, as well as to make water the primary drink and set a goal of 64 ounces water daily.     Weight /BMI 10/25/2018 09/02/2018 08/31/2018  WEIGHT 176 lb 176 lb 176 lb  HEIGHT 5\' 6"  5\' 6"  5\' 6"   BMI 28.41 kg/m2 28.41 kg/m2 28.41 kg/m2        Follow Up Instructions:    I discussed the assessment and treatment plan with the patient. The patient was provided an opportunity to ask questions and all were answered. The patient agreed with the plan and demonstrated an understanding of the instructions.   The patient was advised to call back or seek an in-person evaluation if the symptoms worsen or if the condition fails to improve as anticipated.  I provided 15 minutes of non-face-to-face time during this encounter.   Tula Nakayama, MD

## 2018-10-31 NOTE — Assessment & Plan Note (Signed)
   Patient re-educated about  the importance of commitment to a  minimum of 150 minutes of exercise per week as able.  The importance of healthy food choices with portion control discussed, as well as eating regularly and within a 12 hour window most days. The need to choose "clean , green" food 50 to 75% of the time is discussed, as well as to make water the primary drink and set a goal of 64 ounces water daily.     Weight /BMI 10/25/2018 09/02/2018 08/31/2018  WEIGHT 176 lb 176 lb 176 lb  HEIGHT 5\' 6"  5\' 6"  5\' 6"   BMI 28.41 kg/m2 28.41 kg/m2 28.41 kg/m2

## 2018-10-31 NOTE — Assessment & Plan Note (Signed)
Controlled, no change in medication DASH diet and commitment to daily physical activity for a minimum of 30 minutes discussed and encouraged, as a part of hypertension management. The importance of attaining a healthy weight is also discussed.  BP/Weight 10/25/2018 09/02/2018 08/31/2018 07/29/2018 07/09/2018 02/04/2018 01/13/2956  Systolic BP 473 403 709 94 643 838 184  Diastolic BP 70 037 75 64 72 75 76  Wt. (Lbs) 176 176 176 175 157 172.25 171  BMI 28.41 28.41 28.41 28.25 25.34 27.8 27.19

## 2018-11-01 ENCOUNTER — Encounter: Payer: Self-pay | Admitting: Family Medicine

## 2018-11-30 ENCOUNTER — Other Ambulatory Visit: Payer: Self-pay | Admitting: Family Medicine

## 2018-11-30 ENCOUNTER — Encounter: Payer: Self-pay | Admitting: Family Medicine

## 2018-11-30 ENCOUNTER — Other Ambulatory Visit: Payer: Self-pay

## 2018-11-30 ENCOUNTER — Ambulatory Visit (HOSPITAL_COMMUNITY)
Admission: RE | Admit: 2018-11-30 | Discharge: 2018-11-30 | Disposition: A | Discharge status: Home / Self Care | Payer: Commercial Managed Care - PPO | Source: Ambulatory Visit | Attending: Family Medicine | Admitting: Family Medicine

## 2018-11-30 DIAGNOSIS — G8929 Other chronic pain: Secondary | ICD-10-CM | POA: Insufficient documentation

## 2018-11-30 DIAGNOSIS — M5442 Lumbago with sciatica, left side: Secondary | ICD-10-CM

## 2018-11-30 NOTE — Progress Notes (Signed)
Dg lumbar °

## 2018-12-01 ENCOUNTER — Other Ambulatory Visit: Payer: Self-pay | Admitting: Family Medicine

## 2018-12-01 ENCOUNTER — Encounter: Payer: Self-pay | Admitting: Family Medicine

## 2018-12-01 DIAGNOSIS — M549 Dorsalgia, unspecified: Secondary | ICD-10-CM

## 2018-12-06 ENCOUNTER — Encounter: Payer: Self-pay | Admitting: Family Medicine

## 2018-12-06 ENCOUNTER — Telehealth: Payer: Self-pay | Admitting: Family Medicine

## 2018-12-06 NOTE — Telephone Encounter (Signed)
pls call and let her know I wll see her in tele visit tomorrow and schedule

## 2018-12-06 NOTE — Telephone Encounter (Signed)
pls advise her to and sched an appt with Jarrett Soho in office tomorrow for further management of her allergic reaction Take benadryl as she has been doing since helping the swelling. If she develops difficulty breathing or swallowing go to ED immediately

## 2018-12-07 ENCOUNTER — Encounter: Payer: Self-pay | Admitting: Family Medicine

## 2018-12-07 ENCOUNTER — Ambulatory Visit (HOSPITAL_COMMUNITY): Payer: Commercial Managed Care - PPO

## 2018-12-07 ENCOUNTER — Other Ambulatory Visit: Payer: Self-pay

## 2018-12-07 ENCOUNTER — Ambulatory Visit (INDEPENDENT_AMBULATORY_CARE_PROVIDER_SITE_OTHER): Payer: Commercial Managed Care - PPO | Admitting: Family Medicine

## 2018-12-07 VITALS — BP 129/90 | Ht 66.0 in | Wt 156.0 lb

## 2018-12-07 DIAGNOSIS — I10 Essential (primary) hypertension: Secondary | ICD-10-CM | POA: Diagnosis not present

## 2018-12-07 DIAGNOSIS — E663 Overweight: Secondary | ICD-10-CM

## 2018-12-07 DIAGNOSIS — T7840XA Allergy, unspecified, initial encounter: Secondary | ICD-10-CM

## 2018-12-07 DIAGNOSIS — E876 Hypokalemia: Secondary | ICD-10-CM | POA: Diagnosis not present

## 2018-12-07 DIAGNOSIS — Z1231 Encounter for screening mammogram for malignant neoplasm of breast: Secondary | ICD-10-CM

## 2018-12-07 NOTE — Progress Notes (Signed)
Virtual Visit via Telephone Note  I connected with [name] on 12/07/18 at  8:40 AM EDT by telephone and verified that I am speaking with the correct person using two identifiers.  Location: Patient:work Provider: office   I discussed the limitations, risks, security and privacy concerns of performing an evaluation and management service by telephone and the availability of in person appointments. I also discussed with the patient that there may be a patient responsible charge related to this service. The patient expressed understanding and agreed to proceed.   History of Present Illness: Pt ate around 8 pm Sunday beans she had not eaten before, also had a new hair color applied the day before. Started a headache the day before, washing out the rinse relieved the headache. No respiratory compromise , benadryl two tabs in total has reversed her symptoms, no difficulty swallowing experienced. "very sensitive / allergic"   Has phone record on file re facial and lip swelling. Did not work the day before as a result but is at work today   Observations/Objective: BP 129/90   Ht 5\' 6"  (1.676 m)   Wt 156 lb (70.8 kg)   BMI 25.18 kg/m  Good communication with no confusion and intact memory. Alert and oriented x 3 No signs of respiratory distress during sppech    Assessment and Plan: Allergic reaction Acute facial and lip swelling, unclear etiology, rapid response to benadryl.Avoid dye and beans ,  Both possible culprits and work excuse  Essential hypertension Uncontrolled  DASH diet and commitment to daily physical activity for a minimum of 30 minutes discussed and encouraged, as a part of hypertension management. The importance of attaining a healthy weight is also discussed.  BP/Weight 12/07/2018 10/25/2018 09/02/2018 08/31/2018 07/29/2018 10/11/9627 11/06/8411  Systolic BP 244 010 272 536 94 644 034  Diastolic BP 90 70 742 75 64 72 75  Wt. (Lbs) 156 176 176 176 175 157 172.25  BMI 25.18  28.41 28.41 28.41 28.25 25.34 27.8     Re eval in office   Overweight (BMI 25.0-29.9) Patient re-educated about  the importance of commitment to a  minimum of 150 minutes of exercise per week   as able. Improved , she is applauded on this  The importance of healthy food choices with portion control discussed, as well as eating regularly and within a 12 hour window most days. The need to choose "clean , green" food 50 to 75% of the time is discussed, as well as to make water the primary drink and set a goal of 64 ounces water daily.  .   Weight /BMI 12/07/2018 10/25/2018 09/02/2018  WEIGHT 156 lb 176 lb 176 lb  HEIGHT 5\' 6"  5\' 6"  5\' 6"   BMI 25.18 kg/m2 28.41 kg/m2 28.41 kg/m2      HYPOKALEMIA Updated lab needed, medication compliance stressed   Follow Up Instructions:    I discussed the assessment and treatment plan with the patient. The patient was provided an opportunity to ask questions and all were answered. The patient agreed with the plan and demonstrated an understanding of the instructions.   The patient was advised to call back or seek an in-person evaluation if the symptoms worsen or if the condition fails to improve as anticipated.  I provided 15 minutes of non-face-to-face time during this encounter.   Tula Nakayama, MD

## 2018-12-07 NOTE — Patient Instructions (Signed)
Keep follow up as before, call if you need  Me before   Take benadryl one  Each night for the next  2 to 3  Nights  Work excuse for 12/06/2018 to return 12/07/2018, will be e mailed to you per your request  Mammogram  Being scheduled early morning per your request for 10/15 or after  Please get fasting chem 7 and EGFr 1 week before your next visit.  Avoid the beans that you ate and the extra spices as well as the hair color that you recently used  It is important that you exercise regularly at least 30 minutes 5 times a week. If you develop chest pain, have severe difficulty breathing, or feel very tired, stop exercising immediately and seek medical attention  Social distancing. Frequent hand washing with soap and water Keeping your hands off of your face. These 3 practices will help to keep both you and your community healthy during this time. Please practice them faithfully!   Thanks for choosing [facility], we consider it a privelige to serve you.

## 2018-12-12 ENCOUNTER — Encounter: Payer: Self-pay | Admitting: Family Medicine

## 2018-12-12 NOTE — Assessment & Plan Note (Signed)
Uncontrolled  DASH diet and commitment to daily physical activity for a minimum of 30 minutes discussed and encouraged, as a part of hypertension management. The importance of attaining a healthy weight is also discussed.  BP/Weight 12/07/2018 10/25/2018 09/02/2018 08/31/2018 07/29/2018 03/09/8181 03/16/3715  Systolic BP 967 893 810 175 94 102 585  Diastolic BP 90 70 277 75 64 72 75  Wt. (Lbs) 156 176 176 176 175 157 172.25  BMI 25.18 28.41 28.41 28.41 28.25 25.34 27.8     Re eval in office

## 2018-12-12 NOTE — Assessment & Plan Note (Signed)
Updated lab needed, medication compliance stressed

## 2018-12-12 NOTE — Assessment & Plan Note (Signed)
Patient re-educated about  the importance of commitment to a  minimum of 150 minutes of exercise per week   as able. Improved , she is applauded on this  The importance of healthy food choices with portion control discussed, as well as eating regularly and within a 12 hour window most days. The need to choose "clean , green" food 50 to 75% of the time is discussed, as well as to make water the primary drink and set a goal of 64 ounces water daily.  .   Weight /BMI 12/07/2018 10/25/2018 09/02/2018  WEIGHT 156 lb 176 lb 176 lb  HEIGHT 5\' 6"  5\' 6"  5\' 6"   BMI 25.18 kg/m2 28.41 kg/m2 28.41 kg/m2

## 2018-12-12 NOTE — Assessment & Plan Note (Signed)
Acute facial and lip swelling, unclear etiology, rapid response to benadryl.Avoid dye and beans ,  Both possible culprits and work excuse

## 2018-12-21 ENCOUNTER — Other Ambulatory Visit: Payer: Self-pay

## 2018-12-21 ENCOUNTER — Ambulatory Visit (INDEPENDENT_AMBULATORY_CARE_PROVIDER_SITE_OTHER): Payer: Commercial Managed Care - PPO | Admitting: Family Medicine

## 2018-12-21 ENCOUNTER — Encounter: Payer: Self-pay | Admitting: Family Medicine

## 2018-12-21 VITALS — BP 129/84 | Ht 66.0 in | Wt 177.0 lb

## 2018-12-21 DIAGNOSIS — J309 Allergic rhinitis, unspecified: Secondary | ICD-10-CM | POA: Diagnosis not present

## 2018-12-21 DIAGNOSIS — M549 Dorsalgia, unspecified: Secondary | ICD-10-CM | POA: Diagnosis not present

## 2018-12-21 DIAGNOSIS — E663 Overweight: Secondary | ICD-10-CM

## 2018-12-21 DIAGNOSIS — I10 Essential (primary) hypertension: Secondary | ICD-10-CM

## 2018-12-21 NOTE — Progress Notes (Signed)
Virtual Visit via Telephone Note  I connected with [name] on 12/21/18 at 10:20 AM EDT by telephone and verified that I am speaking with the correct person using two identifiers.  Location: Patient: work Provider: office   I discussed the limitations, risks, security and privacy concerns of performing an evaluation and management service by telephone and the availability of in person appointments. I also discussed with the patient that there may be a patient responsible charge related to this service. The patient expressed understanding and agreed to proceed.   History of Present Illness: Denies recent fever or chills. Denies sinus pressure, nasal congestion, ear pain or sore throat. Denies chest congestion, productive cough or wheezing. Denies chest pains, palpitations and leg swelling Denies abdominal pain, nausea, vomiting,diarrhea or constipation.   Denies dysuria, frequency, hesitancy or incontinence. C/o recurrent and disabling low back and lower extremity pain. Denies headaches, seizures, numbness, or tingling. Denies depression, anxiety or insomnia. Denies skin break down or rash.       Observations/Objective: BP 129/84   Ht 5\' 6"  (1.676 m)   Wt 177 lb (80.3 kg)   BMI 28.57 kg/m  Good communication with no confusion and intact memory. Alert and oriented x 3 No signs of respiratory distress during speech    Assessment and Plan: Essential hypertension Controlled, no change in medication DASH diet and commitment to daily physical activity for a minimum of 30 minutes discussed and encouraged, as a part of hypertension management. The importance of attaining a healthy weight is also discussed.  BP/Weight 12/22/2018 12/21/2018 12/07/2018 10/25/2018 09/02/2018 08/31/2018 2/95/2841  Systolic BP - 324 401 027 253 664 94  Diastolic BP - 84 90 70 403 75 64  Wt. (Lbs) 175 177 156 176 176 176 175  BMI 28.25 28.57 25.18 28.41 28.41 28.41 28.25       Back pain with  radiation Uncontrolled and disabling. Tylenol and Zanaflex, has ortho eval, will benefit from PT  Allergic rhinitis No significant symptoms currently, uses OTC meds as needed  Overweight (BMI 25.0-29.9)  Patient re-educated about  the importance of commitment to a  minimum of 150 minutes of exercise per week as able.  The importance of healthy food choices with portion control discussed, as well as eating regularly and within a 12 hour window most days. The need to choose "clean , green" food 50 to 75% of the time is discussed, as well as to make water the primary drink and set a goal of 64 ounces water daily.    Weight /BMI 12/22/2018 12/21/2018 12/07/2018  WEIGHT 175 lb 177 lb 156 lb  HEIGHT 5\' 6"  5\' 6"  5\' 6"   BMI 28.25 kg/m2 28.57 kg/m2 25.18 kg/m2        Follow Up Instructions:    I discussed the assessment and treatment plan with the patient. The patient was provided an opportunity to ask questions and all were answered. The patient agreed with the plan and demonstrated an understanding of the instructions.   The patient was advised to call back or seek an in-person evaluation if the symptoms worsen or if the condition fails to improve as anticipated.  I provided 22 minutes of non-face-to-face time during this encounter.   Tula Nakayama, MD

## 2018-12-21 NOTE — Patient Instructions (Signed)
F/U end November, call if you need me sooner  Please continue to commit to healthy food choice and daily exercise  Please get non fasting labs this week whiuch are already ordered  Social distancing. Frequent hand washing with soap and water Keeping your hands off of your face. These 3 practices will help to keep both you and your community healthy during this time. Please practice them faithfully! Thanks for choosing [facility], we consider it a privelige to serve you.

## 2018-12-22 ENCOUNTER — Ambulatory Visit (INDEPENDENT_AMBULATORY_CARE_PROVIDER_SITE_OTHER): Payer: Commercial Managed Care - PPO | Admitting: Orthopaedic Surgery

## 2018-12-22 ENCOUNTER — Other Ambulatory Visit: Payer: Self-pay

## 2018-12-22 ENCOUNTER — Encounter: Payer: Self-pay | Admitting: Orthopaedic Surgery

## 2018-12-22 VITALS — Ht 66.0 in | Wt 175.0 lb

## 2018-12-22 DIAGNOSIS — M545 Low back pain: Secondary | ICD-10-CM

## 2018-12-22 DIAGNOSIS — G8929 Other chronic pain: Secondary | ICD-10-CM

## 2018-12-22 DIAGNOSIS — M549 Dorsalgia, unspecified: Secondary | ICD-10-CM | POA: Diagnosis not present

## 2018-12-22 NOTE — Progress Notes (Signed)
Office Visit Note   Patient: [name]           Date of Birth: October 26, 1970           MRN: [id number] Visit Date: 12/22/2018              Requested by: Fayrene Helper, [address], [place],  [place] [address] PCP: Fayrene Helper, MD   Assessment & Plan: Visit Diagnoses:  1. Chronic bilateral low back pain, unspecified whether sciatica present   2. Back pain with radiation     Plan: Back pain with onset approximately 3 months ago.  Some left-sided symptoms which may or may not be true radicular pain.  Neurologically intact.  We will try a course of physical therapy at [facility].  Office 6 weeks.  Consider MRI scan if no improvement.  Follow-Up Instructions: Return in about 6 weeks (around 02/02/2019).   Orders:  Orders Placed This Encounter  Procedures  . Ambulatory referral to Physical Therapy   No orders of the defined types were placed in this encounter.     Procedures: No procedures performed   Clinical Data: No additional findings.   Subjective: Chief Complaint  Patient presents with  . Lower Back - Pain  Patient presents with lower back pain that presented about 18months ago after riding in a car to church. No known injury. She said that the pain is left sided and radiates into her medial leg. Her left leg feels weak and she has tingling in her lower leg. Lying on her left side makes the pain worse. She has found that heat helps. She takes Ibuprofen rarely. She saw her PCP Dr.Simpson about three weeks ago and had L-Spine x-rays done at [facility].  These films were reviewed.  There was evidence of some facet sclerosis at L3-4 with slight narrowing of the disc space.  No listhesis or scoliosis. 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  She is having so having trouble with her neck on a recurrent basis.  She is having more pain in her back than she is with her leg.  Fortunately taking Tylenol or Advil alleviates  the problem.  She is not had any bowel or bladder changes.  Denies any fever or chills.  Pain is predominant on the left side with no prior surgery.  HPI  Review of Systems   Objective: Vital Signs: Ht 5\' 6"  (1.676 m)   Wt 175 lb (79.4 kg)   LMP 11/25/2018   BMI 28.25 kg/m   Physical Exam Constitutional:      Appearance: She is well-developed.  Eyes:     Pupils: Pupils are equal, round, and reactive to light.  Pulmonary:     Effort: Pulmonary effort is normal.  Skin:    General: Skin is warm and dry.  Neurological:     Mental Status: She is alert and oriented to person, place, and time.  Psychiatric:        Behavior: Behavior normal.     Ortho Exam awake alert and oriented x3.  Comfortable sitting straight leg raise negative bilaterally.  Deep tendon reflexes were symmetrical.  Painless range of motion both hips.  Motor and sensory exam intact.  Walks without a limp.  Some percussible tenderness of the lumbar spine.  No pain over the sacroiliac joints  Specialty Comments:  No specialty comments available.  Imaging: No results found.  PMFS History: Patient Active Problem List   Diagnosis Date Noted  . Allergic reaction 12/07/2018  . Family planning 01/28/2018  . Spotting 04/03/2017  . Multiple allergies 11/28/2015  . Prediabetes 08/01/2012  . Overweight (BMI 25.0-29.9) 08/01/2012  . Vitamin D deficiency 07/28/2012  . Allergic rhinitis 10/16/2009  . HYPOKALEMIA 03/14/2008  . THYROID NODULE 10/26/2007  . Essential hypertension 10/26/2007  . Back pain with radiation 10/26/2007   Past Medical History:  Diagnosis Date  . Back pain   . BV (bacterial vaginosis) 09/16/2013  . Chest pain 2000   right   . Contraceptive management 09/16/2013  . Encephalocele ([place]) 1994  . Enlarged thyroid   . HSV-2 (herpes simplex virus 2) infection   . Hypertension   . Irregular intermenstrual bleeding 04/27/2015  . Obesity   . Preterm labor   . Sleep disorder   . Thyroid  nodule   . Urinary frequency 12/14/2014  . Vaginal discharge 09/16/2013    Family History  Problem Relation Age of Onset  . Anuerysm Mother   . Stroke Mother   . Cancer Father        thyroid   . Hypertension Father   . Cancer Paternal Grandmother        breast  . Seizures Maternal Grandmother     Past Surgical History:  Procedure Laterality Date  . BREAST BIOPSY  2007  . [place]  . DILITATION & CURRETTAGE/HYSTROSCOPY WITH NOVASURE ABLATION N/A 05/06/2016   Procedure: DILATATION & CURETTAGE/HYSTEROSCOPY WITH NOVASURE ABLATION;  Surgeon: Jonnie Kind, MD;  Location: [facility];  Service: Gynecology;  Laterality: N/A;  length-5; width-2.6; power-72 ; time- 1 minute 42 seconds  . LAPAROSCOPIC BILATERAL SALPINGECTOMY Bilateral 05/06/2016   Procedure: LAPAROSCOPIC BILATERAL SALPINGECTOMY;  Surgeon: Jonnie Kind, MD;  Location: [facility];  Service: Gynecology;  Laterality: Bilateral;  . RETAINED PLACENTA REMOVAL    . TUBAL LIGATION     04/2016  . tubal ligation reversal  2001  . WISDOM TOOTH EXTRACTION     Social History   Occupational History  . Not on file  Tobacco Use  . Smoking status: Never Smoker  . Smokeless tobacco: Never Used  Substance and Sexual Activity  . Alcohol use: No  . Drug use: No  . Sexual activity: Yes    Partners: Male    Birth control/protection: Surgical    Comment: tubal and ablation

## 2018-12-26 ENCOUNTER — Encounter: Payer: Self-pay | Admitting: Family Medicine

## 2018-12-26 NOTE — Assessment & Plan Note (Signed)
  Patient re-educated about  the importance of commitment to a  minimum of 150 minutes of exercise per week as able.  The importance of healthy food choices with portion control discussed, as well as eating regularly and within a 12 hour window most days. The need to choose "clean , green" food 50 to 75% of the time is discussed, as well as to make water the primary drink and set a goal of 64 ounces water daily.    Weight /BMI 12/22/2018 12/21/2018 12/07/2018  WEIGHT 175 lb 177 lb 156 lb  HEIGHT 5\' 6"  5\' 6"  5\' 6"   BMI 28.25 kg/m2 28.57 kg/m2 25.18 kg/m2

## 2018-12-26 NOTE — Assessment & Plan Note (Signed)
Uncontrolled and disabling. Tylenol and Zanaflex, has ortho eval, will benefit from PT

## 2018-12-26 NOTE — Assessment & Plan Note (Signed)
Controlled, no change in medication DASH diet and commitment to daily physical activity for a minimum of 30 minutes discussed and encouraged, as a part of hypertension management. The importance of attaining a healthy weight is also discussed.  BP/Weight 12/22/2018 12/21/2018 12/07/2018 10/25/2018 09/02/2018 08/31/2018 1/94/1740  Systolic BP - 814 481 856 314 970 94  Diastolic BP - 84 90 70 263 75 64  Wt. (Lbs) 175 177 156 176 176 176 175  BMI 28.25 28.57 25.18 28.41 28.41 28.41 28.25

## 2018-12-26 NOTE — Assessment & Plan Note (Signed)
No significant symptoms currently, uses OTC meds as needed

## 2018-12-28 ENCOUNTER — Ambulatory Visit: Payer: Commercial Managed Care - PPO | Admitting: Family Medicine

## 2019-01-12 ENCOUNTER — Telehealth (HOSPITAL_COMMUNITY): Payer: Self-pay | Admitting: Family Medicine

## 2019-01-12 NOTE — Telephone Encounter (Signed)
01/12/19  spoke with patient and was going to reschedule but she was on her lunch break and has to speak to her supervisor and will call back to reschedule

## 2019-01-13 ENCOUNTER — Ambulatory Visit (HOSPITAL_COMMUNITY): Payer: Commercial Managed Care - PPO | Admitting: Physical Therapy

## 2019-01-17 ENCOUNTER — Encounter: Payer: Self-pay | Admitting: Family Medicine

## 2019-01-18 ENCOUNTER — Ambulatory Visit: Payer: Commercial Managed Care - PPO | Admitting: Family Medicine

## 2019-01-18 ENCOUNTER — Telehealth: Payer: Self-pay | Admitting: Orthopaedic Surgery

## 2019-01-18 NOTE — Telephone Encounter (Signed)
yes

## 2019-01-18 NOTE — Telephone Encounter (Signed)
Spoke with patient. She is aware that she needs to be evaluated first. Can you please call and schedule this patient with Dr.Whitfield? Thanks!

## 2019-01-18 NOTE — Telephone Encounter (Signed)
Patient left a voicemail stating she was walking on Friday, 01/14/19 and heard a popping noise.  Patient states her knee began swelling and today she went to Urgent Care and the doctor suggested she have an MRI.   Patient is requesting Dr. Durward Fortes refer her for MRI.  Please advise.

## 2019-01-18 NOTE — Telephone Encounter (Signed)
I do not see where you have seen this patient for her knee before. Do you want to first schedule an appointment with you before the MRI?

## 2019-01-19 ENCOUNTER — Ambulatory Visit: Payer: Commercial Managed Care - PPO | Admitting: Orthopaedic Surgery

## 2019-01-19 ENCOUNTER — Other Ambulatory Visit: Payer: Self-pay

## 2019-01-20 NOTE — Telephone Encounter (Signed)
Patient is schedule dto see doctor in the [place] office on 02/02/2019.

## 2019-01-21 ENCOUNTER — Other Ambulatory Visit: Payer: Self-pay

## 2019-01-21 ENCOUNTER — Emergency Department (HOSPITAL_COMMUNITY): Payer: Commercial Managed Care - PPO

## 2019-01-21 ENCOUNTER — Emergency Department (HOSPITAL_COMMUNITY)
Admission: EM | Admit: 2019-01-21 | Discharge: 2019-01-21 | Disposition: A | Discharge status: Home / Self Care | Payer: Commercial Managed Care - PPO | Attending: Emergency Medicine | Admitting: Emergency Medicine

## 2019-01-21 ENCOUNTER — Encounter (HOSPITAL_COMMUNITY): Payer: Self-pay | Admitting: *Deleted

## 2019-01-21 DIAGNOSIS — W2203XA Walked into furniture, initial encounter: Secondary | ICD-10-CM | POA: Diagnosis not present

## 2019-01-21 DIAGNOSIS — I10 Essential (primary) hypertension: Secondary | ICD-10-CM | POA: Insufficient documentation

## 2019-01-21 DIAGNOSIS — M25561 Pain in right knee: Secondary | ICD-10-CM | POA: Insufficient documentation

## 2019-01-21 DIAGNOSIS — Z79899 Other long term (current) drug therapy: Secondary | ICD-10-CM | POA: Diagnosis not present

## 2019-01-21 DIAGNOSIS — Z91010 Allergy to peanuts: Secondary | ICD-10-CM | POA: Diagnosis not present

## 2019-01-21 MED ORDER — NAPROXEN 500 MG PO TABS: 500.0000 mg | ORAL_TABLET | Freq: Two times a day (BID) | ORAL | 0 refills | Status: DC

## 2019-01-21 NOTE — ED Provider Notes (Signed)
[facility] EMERGENCY DEPARTMENT Provider Note   CSN: [id number] Arrival date & time: 01/21/19  1720     History   Chief Complaint Chief Complaint  Patient presents with  . Knee Pain    right    HPI [name] is a 48 y.o. female with a hx of HTN, thyroid disorder, & HSV2 who presents to the ED with complaints of R knee pain x 1 week. Patient states that she hit the front of her knee on a dresser & since then has had pain & swelling to the knee. Sxs are constant, worse with movement, no alleviating factors. Has been wearing OTC knee brace w/o relief. Denies fever, color change, numbness, tingling, or weakness. Tried to call orthopedics- cannot get appointment until 07/29.     HPI  Past Medical History:  Diagnosis Date  . Back pain   . BV (bacterial vaginosis) 09/16/2013  . Chest pain 2000   right   . Contraceptive management 09/16/2013  . Encephalocele ([place]) 1994  . Enlarged thyroid   . HSV-2 (herpes simplex virus 2) infection   . Hypertension   . Irregular intermenstrual bleeding 04/27/2015  . Obesity   . Preterm labor   . Sleep disorder   . Thyroid nodule   . Urinary frequency 12/14/2014  . Vaginal discharge 09/16/2013    Patient Active Problem List   Diagnosis Date Noted  . Allergic reaction 12/07/2018  . Family planning 01/28/2018  . Spotting 04/03/2017  . Multiple allergies 11/28/2015  . Prediabetes 08/01/2012  . Overweight (BMI 25.0-29.9) 08/01/2012  . Vitamin D deficiency 07/28/2012  . Allergic rhinitis 10/16/2009  . HYPOKALEMIA 03/14/2008  . THYROID NODULE 10/26/2007  . Essential hypertension 10/26/2007  . Back pain with radiation 10/26/2007    Past Surgical History:  Procedure Laterality Date  . BREAST BIOPSY  2007  . [place]  . DILITATION & CURRETTAGE/HYSTROSCOPY WITH NOVASURE ABLATION N/A 05/06/2016   Procedure: DILATATION & CURETTAGE/HYSTEROSCOPY WITH NOVASURE ABLATION;  Surgeon: Jonnie Kind, MD;  Location: [facility];  Service: Gynecology;   Laterality: N/A;  length-5; width-2.6; power-72 ; time- 1 minute 42 seconds  . LAPAROSCOPIC BILATERAL SALPINGECTOMY Bilateral 05/06/2016   Procedure: LAPAROSCOPIC BILATERAL SALPINGECTOMY;  Surgeon: Jonnie Kind, MD;  Location: [facility];  Service: Gynecology;  Laterality: Bilateral;  . RETAINED PLACENTA REMOVAL    . TUBAL LIGATION     04/2016  . tubal ligation reversal  2001  . WISDOM TOOTH EXTRACTION       OB History    Gravida  6   Para  5   Term  2   Preterm  3   AB  1   Living  5     SAB  0   TAB  1   Ectopic  0   Multiple  0   Live Births               Home Medications    Prior to Admission medications   Medication Sig Start Date End Date Taking? Authorizing Provider  cyclobenzaprine (FLEXERIL) 10 MG tablet Take one tablet at bedtime, as needed, for muscle spasm Patient not taking: Reported on 12/22/2018 08/30/18   Fayrene Helper, MD  diltiazem (CARDIZEM CD) 120 MG 24 hr capsule Take 1 capsule (120 mg total) by mouth daily. 10/25/18   Fayrene Helper, MD  hydrochlorothiazide (HYDRODIURIL) 25 MG tablet TAKE 1 TABLET BY MOUTH EVERY DAY 09/09/18   Fayrene Helper, MD  ibuprofen (ADVIL)  600 MG tablet One tablet three times daily by mouth for 5 days, then as needed Patient not taking: Reported on 12/22/2018 10/25/18   Fayrene Helper, MD  metoprolol tartrate (LOPRESSOR) 25 MG tablet Take half tablet two times daily by mouth for blood pressure 10/25/18   Fayrene Helper, MD  omeprazole (PRILOSEC) 20 MG capsule Take 1 capsule (20 mg total) by mouth daily. Patient not taking: Reported on 12/22/2018 10/25/18   Fayrene Helper, MD  potassium chloride (K-DUR) 10 MEQ tablet Take 4 tablets two times daily 07/29/18   Fayrene Helper, MD    Family History Family History  Problem Relation Age of Onset  . Anuerysm Mother   . Stroke Mother   . Cancer Father        thyroid   . Hypertension Father   . Cancer Paternal Grandmother        breast  .  Seizures Maternal Grandmother     Social History Social History   Tobacco Use  . Smoking status: Never Smoker  . Smokeless tobacco: Never Used  Substance Use Topics  . Alcohol use: No  . Drug use: No     Allergies   Biaxin [clarithromycin], Iodinated diagnostic agents, Shellfish allergy, Yellow jacket venom [bee venom], and Peanut-containing drug products   Review of Systems Review of Systems  Constitutional: Negative for chills and fever.  Musculoskeletal: Positive for arthralgias and joint swelling.  Skin: Negative for color change, rash and wound.  Neurological: Negative for weakness and numbness.     Physical Exam Updated Vital Signs BP 130/85 (BP Location: Right Arm)   Pulse 68   Temp 98.8 F (37.1 C) (Oral)   Resp 15   Ht 5\' 6"  (1.676 m)   Wt 79.4 kg   SpO2 100%   BMI 28.25 kg/m   Physical Exam Vitals signs and nursing note reviewed.  Constitutional:      General: She is not in acute distress.    Appearance: She is not ill-appearing or toxic-appearing.  HENT:     Head: Normocephalic and atraumatic.  Cardiovascular:     Pulses:          Dorsalis pedis pulses are 2+ on the right side and 2+ on the left side.       Posterior tibial pulses are 2+ on the right side and 2+ on the left side.  Pulmonary:     Effort: Pulmonary effort is normal.  Musculoskeletal:     Comments: Lower extremities: Mild R knee swelling w/ possible effusion noted. No obvious deformity,  erythema, ecchymosis, warmth, rashes, or open wounds. Patient has intact AROM to bilateral hips, knees, ankles, and all digits. Tender to palpation to the anterior knee- specifically the patella. Otherwise nontender. No calf tenderness.   Skin:    General: Skin is warm and dry.     Capillary Refill: Capillary refill takes less than 2 seconds.  Neurological:     Mental Status: She is alert.     Comments: Alert. Clear speech. Sensation grossly intact to bilateral lower extremities. 5/5 strength with  plantar/dorsiflexion bilaterally. Patient ambulatory with somewhat antalgic gait, no foot drop noted.   Psychiatric:        Mood and Affect: Mood normal.        Behavior: Behavior normal.    ED Treatments / Results  Labs (all labs ordered are listed, but only abnormal results are displayed) Labs Reviewed - No data to display  EKG None  Radiology  Dg Knee Complete 4 Views Right  Result Date: 01/21/2019 CLINICAL DATA:  Right knee pain and swelling for 7 days, knee strike on dresser EXAM: RIGHT KNEE - COMPLETE 4+ VIEW COMPARISON:  None. FINDINGS: Moderate right knee suprapatellar joint effusion. No acute fracture or traumatic malalignment. Corticated fabella posteriorly. Mild tricompartmental degenerative changes. Enthesopathic spurring at the patellar insertion of the quadriceps tendon. IMPRESSION: 1. Moderate right knee suprapatellar joint effusion. No acute osseous abnormality. 2. Mild tricompartmental degenerative changes. Electronically Signed   By: Lovena Le M.D.   On: 01/21/2019 18:43    Procedures Procedures (including critical care time)  SPLINT APPLICATION Date/Time: 8:58 PM Authorized by: Kennith Maes Consent: Verbal consent obtained. Risks and benefits: risks, benefits and alternatives were discussed Consent given by: patient Splint applied by: Nursing staff Location details: RLE Splint type: Knee immobilizer Supplies used: knee immobilizer  Post-procedure: The splinted body part was neurovascularly unchanged following the procedure. Patient tolerance: Patient tolerated the procedure well with no immediate complications.  Medications Ordered in ED Medications - No data to display   Initial Impression / Assessment and Plan / ED Course  I have reviewed the triage vital signs and the nursing notes.  Pertinent labs & imaging results that were available during my care of the patient were reviewed by me and considered in my medical decision making (see chart for  details).   Patient presents to the ED w/ complaints of R knee pain s/p injury about 1 week ago. Nontoxic appearing, no apparent distress, vitals WNL. No fever, erythema, warmth, or significant ROM limitation to suggest septic joint/bursitis, or other infectious etiology such as cellulitis, osteomyelitis, or osteomyelitis. Mild swelling w/ possible effusion noted. Anterior tenderness. Pain seems localized to the knee, doubt DVT. X-ray reveals moderate right knee suprapatellar joint effusion, no acute osseous abnormality, & mild tricompartmental degenerative changes.---> will place in knee immobilizer, provide Naproxen prescription, recommended PRICE. Will give local orthopedics information for closer follow up if needed. I discussed results, treatment plan, need for follow-up, and return precautions with the patient. Provided opportunity for questions, patient confirmed understanding and is in agreement with plan.    Final Clinical Impressions(s) / ED Diagnoses   Final diagnoses:  Acute pain of right knee    ED Discharge Orders         Ordered    naproxen (NAPROSYN) 500 MG tablet  2 times daily     01/21/19 [address], PA-C 01/21/19 [place], [place], DO 01/23/19 1525

## 2019-01-21 NOTE — Discharge Instructions (Signed)
Please read and follow all provided instructions.  You have been seen today for knee pain  Tests performed today include: An x-ray of the affected area - does NOT show any broken bones or dislocations- it does show some fluid around the knee & degenerative changes.  Vital signs. See below for your results today.   Home care instructions: -- *PRICE in the first 24-48 hours: Protect (with brace, splint, sling), if given by your provider Rest Ice- Do not apply ice pack directly to your skin, place towel or similar between your skin and ice/ice pack. Apply ice for 20 min, then remove for 40 min while awake Compression- Wear brace, elastic bandage, splint as directed by your provider Elevate affected extremity above the level of your heart when not walking around for the first 24-48 hours   Medications:  - Naproxen is a nonsteroidal anti-inflammatory medication that will help with pain and swelling. Be sure to take this medication as prescribed with food, 1 pill every 12 hours,  It should be taken with food, as it can cause stomach upset, and more seriously, stomach bleeding. Do not take other nonsteroidal anti-inflammatory medications with this such as Advil, Motrin, Aleve, Mobic, Goodie Powder, or Motrin.    You make take Tylenol per over the counter dosing with these medications.   We have prescribed you new medication(s) today. Discuss the medications prescribed today with your pharmacist as they can have adverse effects and interactions with your other medicines including over the counter and prescribed medications. Seek medical evaluation if you start to experience new or abnormal symptoms after taking one of these medicines, seek care immediately if you start to experience difficulty breathing, feeling of your throat closing, facial swelling, or rash as these could be indications of a more serious allergic reaction   Follow-up instructions: Please follow-up with your primary care provider  or the provided orthopedic physician (bone specialist) if you continue to have significant pain in 1 week. In this case you may have a more severe injury that requires further care.   Return instructions:  Please return if your digits or extremity are numb or tingling, appear gray or blue, or you have severe pain (also elevate the extremity and loosen splint or wrap if you were given one) Please return if you have redness or fevers.  Please return to the Emergency Department if you experience worsening symptoms.  Please return if you have any other emergent concerns. Additional Information:  Your vital signs today were: BP 130/85 (BP Location: Right Arm)    Pulse 68    Temp 98.8 F (37.1 C) (Oral)    Resp 15    Ht 5\' 6"  (1.676 m)    Wt 79.4 kg    SpO2 100%    BMI 28.25 kg/m  If your blood pressure (BP) was elevated above 135/85 this visit, please have this repeated by your doctor within one month. ---------------

## 2019-01-21 NOTE — ED Triage Notes (Signed)
Patient with right knee pain since last Saturday, was seen at urgent care on Tuesday was given a brace and referral to orthopedics. Orthopedics cannot see patient until the 29th, pcp out of the office.  Patient knee swelling, pain increased.  Patient feels she may have bumped her knee on a dresser on Saturday.

## 2019-01-25 ENCOUNTER — Encounter: Payer: Self-pay | Admitting: Family Medicine

## 2019-01-25 ENCOUNTER — Ambulatory Visit (INDEPENDENT_AMBULATORY_CARE_PROVIDER_SITE_OTHER): Payer: Commercial Managed Care - PPO | Admitting: Family Medicine

## 2019-01-25 ENCOUNTER — Other Ambulatory Visit: Payer: Self-pay

## 2019-01-25 VITALS — BP 118/80 | HR 61 | Temp 98.8°F | Resp 15 | Ht 66.0 in | Wt 175.0 lb

## 2019-01-25 DIAGNOSIS — M533 Sacrococcygeal disorders, not elsewhere classified: Secondary | ICD-10-CM | POA: Diagnosis not present

## 2019-01-25 DIAGNOSIS — E663 Overweight: Secondary | ICD-10-CM | POA: Diagnosis not present

## 2019-01-25 DIAGNOSIS — I10 Essential (primary) hypertension: Secondary | ICD-10-CM

## 2019-01-25 DIAGNOSIS — M25561 Pain in right knee: Secondary | ICD-10-CM | POA: Diagnosis not present

## 2019-01-25 MED ORDER — KETOROLAC TROMETHAMINE 60 MG/2ML IM SOLN
60.0000 mg | Freq: Once | INTRAMUSCULAR | Status: AC
  Administered 2019-01-25: 60 mg via INTRAMUSCULAR

## 2019-01-25 MED ORDER — PREDNISONE 5 MG PO TABS: 5.0000 mg | ORAL_TABLET | Freq: Two times a day (BID) | ORAL | 0 refills | Status: AC

## 2019-01-25 MED ORDER — METHYLPREDNISOLONE ACETATE 80 MG/ML IJ SUSP
80.0000 mg | Freq: Once | INTRAMUSCULAR | Status: AC
  Administered 2019-01-25: 80 mg via INTRAMUSCULAR

## 2019-01-25 NOTE — Assessment & Plan Note (Signed)
Uncontrolled.Toradol and depo medrol administered IM in the office , to be followed by a short course of oral prednisone and NSAIDS.  

## 2019-01-25 NOTE — Assessment & Plan Note (Signed)
Controlled, no change in medication DASH diet and commitment to daily physical activity for a minimum of 30 minutes discussed and encouraged, as a part of hypertension management. The importance of attaining a healthy weight is also discussed.  BP/Weight 01/25/2019 01/21/2019 12/22/2018 12/21/2018 12/07/2018 10/25/2018 6/94/5038  Systolic BP 882 800 - 349 179 150 569  Diastolic BP 80 85 - 84 90 70 103  Wt. (Lbs) 175 175 175 177 156 176 176  BMI 28.25 28.25 28.25 28.57 25.18 28.41 28.41

## 2019-01-25 NOTE — Patient Instructions (Signed)
Follow-up in November as before call if you need me sooner.  You are treated today for acute right knee pain and acute right sacroiliitis.  Toradol 60 mg IM and Depo-Medrol 80 mg IM administered in the office.  Please fill prescription for prednisone tablets 5 mg 1-2 times daily for 5 days and please take Aleve 1 tablet 2 times daily for 5 days  Continue to ice the knee to reduce swelling  and limit weightbearing as much as possible.  Work note provided for visit this morning arranged with nursing staff to return this afternoon for injection to be administered.

## 2019-01-31 DIAGNOSIS — M533 Sacrococcygeal disorders, not elsewhere classified: Secondary | ICD-10-CM | POA: Insufficient documentation

## 2019-01-31 NOTE — Progress Notes (Signed)
[name]     MRN: [id number]      DOB: [birth year]   HPI [name] is here for follow up and re-evaluation of chronic medical conditions, medication management and review of any available recent lab and radiology data.  Preventive health is updated, specifically  Cancer screening and Immunization.   Questions or concerns regarding consultations or procedures which the PT has had in the interim are  addressed. The PT denies any adverse reactions to current medications since the last visit.  C/o acute right knee pain and swelling which took her to the Ed recently, received oral anti inflammatories, little response, has been icing at home, ahs upcoming Ortho appt  ROS Denies recent fever or chills. Denies sinus pressure, nasal congestion, ear pain or sore throat. Denies chest congestion, productive cough or wheezing. Denies chest pains, palpitations and leg swelling Denies abdominal pain, nausea, vomiting,diarrhea or constipation.   Denies dysuria, frequency, hesitancy or incontinence. . Denies headaches, seizures, numbness, or tingling. Denies depression, anxiety or insomnia. Denies skin break down or rash.   PE  BP 118/80   Pulse 61   Temp 98.8 F (37.1 C) (Temporal)   Resp 15   Ht 5\' 6"  (1.676 m)   Wt 175 lb (79.4 kg)   SpO2 96%   BMI 28.25 kg/m   Patient alert and oriented and in no cardiopulmonary distress.  HEENT: No facial asymmetry, EOMI,   oropharynx pink and moist.  Neck supple no JVD, no mass.  Chest: Clear to auscultation bilaterally.  CVS: S1, S2 no murmurs, no S3.Regular rate.  ABD: Soft non tender.   Ext: No edema  MS: Adequate ROM spine, shoulders, hips and markedly reduced ROM right knee with localized tenderness Tender over right SI joint  Skin: Intact, no ulcerations or rash noted.  Psych: Good eye contact, normal affect. Memory intact not anxious or depressed appearing.  CNS: CN 2-12 intact, power,  normal throughout.no focal deficits noted.    Assessment & Plan  Knee pain, right Uncontrolled.Toradol and depo medrol administered IM in the office , to be followed by a short course of oral prednisone and NSAIDS.   Allergic reaction      Essential hypertension Controlled, no change in medication DASH diet and commitment to daily physical activity for a minimum of 30 minutes discussed and encouraged, as a part of hypertension management. The importance of attaining a healthy weight is also discussed.  BP/Weight 01/25/2019 01/21/2019 12/22/2018 12/21/2018 12/07/2018 10/25/2018 9/52/8413  Systolic BP 244 010 - 272 536 644 034  Diastolic BP 80 85 - 84 90 70 103  Wt. (Lbs) 175 175 175 177 156 176 176  BMI 28.25 28.25 28.25 28.57 25.18 28.41 28.41       Sacro ilial pain Acute flare right SI tenderness Uncontrolled.Toradol and depo medrol administered IM in the office , to be followed by a short course of oral prednisone and NSAIDS.   Overweight (BMI 25.0-29.9)  Patient re-educated about  the importance of commitment to a  minimum of 150 minutes of exercise per week as able.  The importance of healthy food choices with portion control discussed, as well as eating regularly and within a 12 hour window most days. The need to choose "clean , green" food 50 to 75% of the time is discussed, as well as to make water the primary drink and set a goal of 64 ounces water daily.    Weight /BMI 01/25/2019 01/21/2019 12/22/2018  WEIGHT 175 lb 175  lb 175 lb  HEIGHT 5\' 6"  5\' 6"  5\' 6"   BMI 28.25 kg/m2 28.25 kg/m2 28.25 kg/m2

## 2019-01-31 NOTE — Assessment & Plan Note (Signed)
Acute flare right SI tenderness Uncontrolled.Toradol and depo medrol administered IM in the office , to be followed by a short course of oral prednisone and NSAIDS.

## 2019-01-31 NOTE — Assessment & Plan Note (Signed)
  Patient re-educated about  the importance of commitment to a  minimum of 150 minutes of exercise per week as able.  The importance of healthy food choices with portion control discussed, as well as eating regularly and within a 12 hour window most days. The need to choose "clean , green" food 50 to 75% of the time is discussed, as well as to make water the primary drink and set a goal of 64 ounces water daily.    Weight /BMI 01/25/2019 01/21/2019 12/22/2018  WEIGHT 175 lb 175 lb 175 lb  HEIGHT 5\' 6"  5\' 6"  5\' 6"   BMI 28.25 kg/m2 28.25 kg/m2 28.25 kg/m2

## 2019-02-02 ENCOUNTER — Encounter: Payer: Self-pay | Admitting: Orthopaedic Surgery

## 2019-02-02 ENCOUNTER — Ambulatory Visit (INDEPENDENT_AMBULATORY_CARE_PROVIDER_SITE_OTHER): Payer: Commercial Managed Care - PPO | Admitting: Orthopaedic Surgery

## 2019-02-02 ENCOUNTER — Other Ambulatory Visit: Payer: Self-pay

## 2019-02-02 VITALS — BP 120/77 | HR 57 | Ht 67.0 in | Wt 175.0 lb

## 2019-02-02 DIAGNOSIS — M25561 Pain in right knee: Secondary | ICD-10-CM

## 2019-02-02 NOTE — Progress Notes (Signed)
Office Visit Note   Patient: [name]           Date of Birth: 1971-03-05           MRN: [id number] Visit Date: 02/02/2019              Requested by: Fayrene Helper, [address], [place],  [place] [address] PCP: Fayrene Helper, MD   Assessment & Plan: Visit Diagnoses:  1. Acute pain of right knee     Plan: Acute onset of right knee pain several weeks ago prompting emergency room visit.  Has seen her primary care physician with a cortisone injection "in my back" which helped my knee.  Also has an oral prednisone prescription.  She has a positive effusion with degenerative changes on her plain films.  I think it is worth aspirating her knee and injecting cortisone.  She would like to wait till Friday because of work.  We will asked Dr. French Ana to evaluate on that day  Follow-Up Instructions: Return To see Dr. French Ana on Friday.   Orders:  No orders of the defined types were placed in this encounter.  No orders of the defined types were placed in this encounter.     Procedures: No procedures performed   Clinical Data: No additional findings.   Subjective: Chief Complaint  Patient presents with  . Right Knee - Pain  Patient presents today for right knee pain X2 weeks. She does not recall an injury besides possibly bumping her knee on furniture. She went to the ED at [facility] on 01/21/2019 and had x-rays taken. She was given a brace to wear. She takes tylenol or Advil as needed.. Has chronic problems with her neck and her back but no other specific joints.  Films of her knee were obtained in the in the emergency room recently reviewed these on the PACS system demonstrating mild degenerative changes in the medial compartment.  They were nonweightbearing films  HPI  Review of Systems   Objective: Vital Signs: BP 120/77   Pulse (!) 57   Ht 5\' 7"  (1.702 m)   Wt 175 lb (79.4 kg)   BMI 27.41 kg/m   Physical Exam Constitutional:      Appearance:  She is well-developed.  Eyes:     Pupils: Pupils are equal, round, and reactive to light.  Pulmonary:     Effort: Pulmonary effort is normal.  Skin:    General: Skin is warm and dry.  Neurological:     Mental Status: She is alert and oriented to person, place, and time.  Psychiatric:        Behavior: Behavior normal.     Ortho Exam awake alert and oriented x3.  Comfortable sitting.  Positive moderate effusion right knee with some tenderness in multiple locations.  Probably a little more tenderness along the medial joint.  Lacks just a few degrees of full extension probably based on the effusion.  Some popliteal fullness but no pain.  No calf pain.  No distal edema.  No patella pain.  No evidence of instability.  Straight leg raise negative.  Painless range of motion both hips  Specialty Comments:  No specialty comments available.  Imaging: No results found.   PMFS History: Patient Active Problem List   Diagnosis Date Noted  . Sacro ilial pain 01/31/2019  . Pain in right knee 01/25/2019  . Allergic reaction 12/07/2018  . Family planning 01/28/2018  . Spotting 04/03/2017  .  Multiple allergies 11/28/2015  . Prediabetes 08/01/2012  . Overweight (BMI 25.0-29.9) 08/01/2012  . Vitamin D deficiency 07/28/2012  . Allergic rhinitis 10/16/2009  . HYPOKALEMIA 03/14/2008  . THYROID NODULE 10/26/2007  . Essential hypertension 10/26/2007  . Back pain with radiation 10/26/2007   Past Medical History:  Diagnosis Date  . Back pain   . BV (bacterial vaginosis) 09/16/2013  . Chest pain 2000   right   . Contraceptive management 09/16/2013  . Encephalocele ([place]) 1994  . Enlarged thyroid   . HSV-2 (herpes simplex virus 2) infection   . Hypertension   . Irregular intermenstrual bleeding 04/27/2015  . Obesity   . Preterm labor   . Sleep disorder   . Thyroid nodule   . Urinary frequency 12/14/2014  . Vaginal discharge 09/16/2013    Family History  Problem Relation Age of Onset  .  Anuerysm Mother   . Stroke Mother   . Cancer Father        thyroid   . Hypertension Father   . Cancer Paternal Grandmother        breast  . Seizures Maternal Grandmother     Past Surgical History:  Procedure Laterality Date  . BREAST BIOPSY  2007  . [place]  . DILITATION & CURRETTAGE/HYSTROSCOPY WITH NOVASURE ABLATION N/A 05/06/2016   Procedure: DILATATION & CURETTAGE/HYSTEROSCOPY WITH NOVASURE ABLATION;  Surgeon: Jonnie Kind, MD;  Location: [facility];  Service: Gynecology;  Laterality: N/A;  length-5; width-2.6; power-72 ; time- 1 minute 42 seconds  . LAPAROSCOPIC BILATERAL SALPINGECTOMY Bilateral 05/06/2016   Procedure: LAPAROSCOPIC BILATERAL SALPINGECTOMY;  Surgeon: Jonnie Kind, MD;  Location: [facility];  Service: Gynecology;  Laterality: Bilateral;  . RETAINED PLACENTA REMOVAL    . TUBAL LIGATION     04/2016  . tubal ligation reversal  2001  . WISDOM TOOTH EXTRACTION     Social History   Occupational History  . Not on file  Tobacco Use  . Smoking status: Never Smoker  . Smokeless tobacco: Never Used  Substance and Sexual Activity  . Alcohol use: No  . Drug use: No  . Sexual activity: Yes    Partners: Male    Birth control/protection: Surgical    Comment: tubal and ablation

## 2019-02-04 ENCOUNTER — Other Ambulatory Visit: Payer: Self-pay | Admitting: Orthopedic Surgery

## 2019-02-04 ENCOUNTER — Other Ambulatory Visit (HOSPITAL_COMMUNITY): Payer: Self-pay | Admitting: Orthopedic Surgery

## 2019-02-04 DIAGNOSIS — M25561 Pain in right knee: Secondary | ICD-10-CM

## 2019-02-11 ENCOUNTER — Ambulatory Visit (HOSPITAL_COMMUNITY): Admission: RE | Admit: 2019-02-11 | Payer: Commercial Managed Care - PPO | Source: Ambulatory Visit

## 2019-02-11 ENCOUNTER — Encounter (HOSPITAL_COMMUNITY): Payer: Self-pay

## 2019-03-02 ENCOUNTER — Other Ambulatory Visit: Payer: Self-pay

## 2019-03-02 ENCOUNTER — Telehealth: Payer: Self-pay | Admitting: Adult Health

## 2019-03-02 ENCOUNTER — Other Ambulatory Visit: Payer: Commercial Managed Care - PPO | Admitting: *Deleted

## 2019-03-02 DIAGNOSIS — R300 Dysuria: Secondary | ICD-10-CM

## 2019-03-02 NOTE — Progress Notes (Signed)
Pt reports that she is having frequency and pain with urination.

## 2019-03-02 NOTE — Telephone Encounter (Signed)
Pt states that she thinks she may have a bladder infection and is wanting to see if she should come in for an appt.

## 2019-03-04 ENCOUNTER — Telehealth: Payer: Self-pay | Admitting: Obstetrics & Gynecology

## 2019-03-04 ENCOUNTER — Telehealth: Payer: Self-pay | Admitting: Adult Health

## 2019-03-04 LAB — URINE CULTURE

## 2019-03-04 MED ORDER — METRONIDAZOLE 0.75 % VA GEL: VAGINAL | 0 refills | Status: DC

## 2019-03-04 NOTE — Telephone Encounter (Signed)
Pt requesting a call with her lab results. 

## 2019-03-04 NOTE — Telephone Encounter (Signed)
Patient called, stated she left a urine sample this week.  Would like medication.  [facility]  [phone]

## 2019-03-12 ENCOUNTER — Other Ambulatory Visit: Payer: Self-pay | Admitting: Adult Health

## 2019-03-31 ENCOUNTER — Encounter: Payer: Self-pay | Admitting: Family Medicine

## 2019-03-31 ENCOUNTER — Other Ambulatory Visit: Payer: Self-pay

## 2019-03-31 ENCOUNTER — Ambulatory Visit (INDEPENDENT_AMBULATORY_CARE_PROVIDER_SITE_OTHER): Payer: Commercial Managed Care - PPO | Admitting: Family Medicine

## 2019-03-31 VITALS — BP 110/70 | HR 64 | Temp 98.3°F | Resp 12 | Ht 66.0 in | Wt 171.1 lb

## 2019-03-31 DIAGNOSIS — I781 Nevus, non-neoplastic: Secondary | ICD-10-CM

## 2019-03-31 DIAGNOSIS — I10 Essential (primary) hypertension: Secondary | ICD-10-CM

## 2019-03-31 NOTE — Progress Notes (Signed)
Subjective:     Patient ID: [name], female   DOB: 1971/05/17, 48 y.o.   MRN: [id number]  [name] presents for dark spot on leg (x 1 month now starting to ache)  [name] is a 48 year old female patient who presents today with a darkened spot on the lateral aspect of her left leg and it has been achy over the last month.  She reports that she has talked to some people about this and they reported that it was spider veins.  She has no swelling of the joint.  Denies having any injury or trauma to the site.  Reports that on her right side she is waiting to get enough money to get an MRI for possible meniscus tear.  But outside of that there is been nothing else.  Left leg is what bothers her most and it bothers her when she is laying down.  She reports that she has been rubbing the area and napping might be making it worse.  Does not currently wear compression socks at this time.  Reports that she has in the past and ectatic her legs if she pushes them down.  Otherwise she has no other complaints today.  Reports taking all her medications as ordered.  Today patient denies signs and symptoms of COVID 19 infection including fever, chills, cough, shortness of breath, and headache.  Past Medical, Surgical, Social History, Allergies, and Medications have been Reviewed.   Past Medical History:  Diagnosis Date  . Back pain   . BV (bacterial vaginosis) 09/16/2013  . Chest pain 2000   right   . Contraceptive management 09/16/2013  . Encephalocele ([place]) 1994  . Enlarged thyroid   . HSV-2 (herpes simplex virus 2) infection   . Hypertension   . Irregular intermenstrual bleeding 04/27/2015  . Obesity   . Preterm labor   . Sleep disorder   . Thyroid nodule   . Urinary frequency 12/14/2014  . Vaginal discharge 09/16/2013   Past Surgical History:  Procedure Laterality Date  . BREAST BIOPSY  2007  . [place]  . DILITATION & CURRETTAGE/HYSTROSCOPY WITH NOVASURE ABLATION N/A 05/06/2016   Procedure: DILATATION & CURETTAGE/HYSTEROSCOPY WITH NOVASURE ABLATION;  Surgeon: Jonnie Kind, MD;  Location: [facility];  Service: Gynecology;  Laterality: N/A;  length-5; width-2.6; power-72 ; time- 1 minute 42 seconds  . LAPAROSCOPIC BILATERAL SALPINGECTOMY Bilateral 05/06/2016   Procedure: LAPAROSCOPIC BILATERAL SALPINGECTOMY;  Surgeon: Jonnie Kind, MD;  Location: [facility];  Service: Gynecology;  Laterality: Bilateral;  . RETAINED PLACENTA REMOVAL    . TUBAL LIGATION     04/2016  . tubal ligation reversal  2001  . WISDOM TOOTH EXTRACTION     Social History   Socioeconomic History  . Marital status: Married    Spouse name: Not on file  . Number of children: 4  . Years of education: Not on file  . Highest education level: Not on file  Occupational History  . Not on file  Social Needs  . Financial resource strain: Not on file  . Food insecurity    Worry: Not on file    Inability: Not on file  . Transportation needs    Medical: Not on file    Non-medical: Not on file  Tobacco Use  . Smoking status: Never Smoker  . Smokeless tobacco: Never Used  Substance and Sexual Activity  . Alcohol use: No  . Drug use: No  . Sexual activity: Yes  Partners: Male    Birth control/protection: Surgical    Comment: tubal and ablation  Lifestyle  . Physical activity    Days per week: Not on file    Minutes per session: Not on file  . Stress: Not on file  Relationships  . Social Herbalist on phone: Not on file    Gets together: Not on file    Attends religious service: Not on file    Active member of club or organization: Not on file    Attends meetings of clubs or organizations: Not on file    Relationship status: Not on file  . Intimate partner violence    Fear of current or ex partner: Not on file    Emotionally abused: Not on file    Physically abused: Not on file    Forced sexual activity: Not on file  Other Topics Concern  . Not on file  Social History  Narrative  . Not on file    Outpatient Encounter Medications as of 03/31/2019  Medication Sig  . cyclobenzaprine (FLEXERIL) 10 MG tablet Take one tablet at bedtime, as needed, for muscle spasm  . diltiazem (CARDIZEM CD) 120 MG 24 hr capsule Take 1 capsule (120 mg total) by mouth daily.  . fluconazole (DIFLUCAN) 150 MG tablet TAKE 1 TABLET BY MOUTH MAY REPEAT IF NEEDED  . hydrochlorothiazide (HYDRODIURIL) 25 MG tablet TAKE 1 TABLET BY MOUTH EVERY DAY  . metoprolol tartrate (LOPRESSOR) 25 MG tablet Take half tablet two times daily by mouth for blood pressure  . naproxen (NAPROSYN) 500 MG tablet Take 1 tablet (500 mg total) by mouth 2 (two) times daily.  . potassium chloride (K-DUR) 10 MEQ tablet Take 4 tablets two times daily  . [DISCONTINUED] metroNIDAZOLE (METROGEL VAGINAL) 0.75 % vaginal gel Nightly x 5 nights (Patient not taking: Reported on 03/31/2019)   No facility-administered encounter medications on file as of 03/31/2019.    Allergies  Allergen Reactions  . Biaxin [Clarithromycin] Diarrhea, Nausea And Vomiting and Other (See Comments)    Blood in stool  . Iodinated Diagnostic Agents Shortness Of Breath    Pt states she had difficulty breathing after receiving iv contrast in 2013.   Marland Kitchen Shellfish Allergy   . Yellow Jacket Venom [Bee Venom] Swelling  . Peanut-Containing Drug Products Rash    Review of Systems  Constitutional: Negative for chills and fever.  HENT: Negative.   Eyes: Negative.   Respiratory: Negative.   Cardiovascular: Negative.   Gastrointestinal: Negative.   Endocrine: Negative.   Genitourinary: Negative.   Musculoskeletal: Negative.        Aches in legs and knees Veins/dark spots  Skin: Negative.   Allergic/Immunologic: Negative.   Neurological: Negative.   Hematological: Negative.   Psychiatric/Behavioral: Negative.   All other systems reviewed and are negative.      Objective:     BP 110/70   Pulse 64   Temp 98.3 F (36.8 C) (Oral)   Resp 12    Ht 5\' 6"  (1.676 m)   Wt 171 lb 1.9 oz (77.6 kg)   SpO2 99%   BMI 27.62 kg/m   Physical Exam Vitals signs and nursing note reviewed.  Constitutional:      Appearance: Normal appearance. She is well-developed and well-groomed. She is obese.  HENT:     Head: Normocephalic and atraumatic.     Right Ear: External ear normal.     Left Ear: External ear normal.     Nose:  Nose normal.     Mouth/Throat:     Mouth: Mucous membranes are moist.     Pharynx: Oropharynx is clear.  Eyes:     General:        Right eye: No discharge.        Left eye: No discharge.     Conjunctiva/sclera: Conjunctivae normal.  Neck:     Musculoskeletal: Normal range of motion and neck supple.  Cardiovascular:     Rate and Rhythm: Normal rate and regular rhythm.     Pulses:          Dorsalis pedis pulses are 2+ on the right side and 2+ on the left side.       Posterior tibial pulses are 1+ on the right side and 1+ on the left side.     Heart sounds: Normal heart sounds.  Pulmonary:     Effort: Pulmonary effort is normal.     Breath sounds: Normal breath sounds.  Musculoskeletal: Normal range of motion.  Skin:    General: Skin is warm.     Comments: spider viens on shin lateral aspect Behind the knees as well  Neurological:     General: No focal deficit present.     Mental Status: She is alert and oriented to person, place, and time.  Psychiatric:        Attention and Perception: Attention normal.        Mood and Affect: Mood normal.        Speech: Speech normal.        Behavior: Behavior normal. Behavior is cooperative.        Thought Content: Thought content normal.        Cognition and Memory: Cognition normal.        Judgment: Judgment normal.        Assessment and Plan       1. Spider veins Spider veins noted on lateral aspect of left lower leg.  Also noted around the back of the knee on both sides bilaterally.  Educated on the use of compression, elevation.  Advised that if she continues  to have difficulties and they do not get any better could look at a vascular vein specialist.  No noted vascular/swollen edema in either extremity at this time. Advised that she can take Tylenol for aches and pains at night.   2. Essential hypertension [name] is encouraged to maintain a well balanced diet that is low in salt. Controlled, continue current medication regimen. No refills needed.  Additionally, she is also reminded that exercise is beneficial for heart health and control of  Blood pressure. 30-60 minutes daily is recommended-walking was suggested.  Follow-up: 05/30/2019  Perlie Mayo, DNP, AGNP-BC [place], [place], [place] [address] Office Hours: Mon-Thurs 8 am-5 pm; Fri 8 am-12 pm Office Phone:  [phone]  Office Fax: [phone]

## 2019-03-31 NOTE — Patient Instructions (Addendum)
    I appreciate the opportunity to provide you with the care for your health and wellness. Today we discussed: achy legs and varicose/spider viens  Follow up: 05/30/2019  No labs or referrals today  Compression socks/ thigh highs  Tylenol as needed for aches.  If not better in the next 2-3 weeks call the office back.  Please continue to practice social distancing to keep you, your family, and our community safe.  If you must go out, please wear a mask and practice good handwashing.  GET FRESH AIR DAILY. STAY HYDRATED WITH WATER.   It was a pleasure to see you and I look forward to continuing to work together on your health and well-being. Please do not hesitate to call the office if you need care or have questions about your care.  Have a wonderful day and week. With Gratitude, Cherly Beach, DNP, AGNP-BC

## 2019-04-09 ENCOUNTER — Other Ambulatory Visit: Payer: Self-pay | Admitting: Family Medicine

## 2019-04-21 ENCOUNTER — Encounter: Payer: Self-pay | Admitting: Adult Health

## 2019-04-21 ENCOUNTER — Other Ambulatory Visit: Payer: Self-pay

## 2019-04-21 ENCOUNTER — Ambulatory Visit (INDEPENDENT_AMBULATORY_CARE_PROVIDER_SITE_OTHER): Payer: Commercial Managed Care - PPO | Admitting: Adult Health

## 2019-04-21 VITALS — BP 115/77 | HR 72 | Ht 66.0 in | Wt 174.5 lb

## 2019-04-21 DIAGNOSIS — R319 Hematuria, unspecified: Secondary | ICD-10-CM

## 2019-04-21 DIAGNOSIS — R309 Painful micturition, unspecified: Secondary | ICD-10-CM | POA: Diagnosis not present

## 2019-04-21 LAB — POCT URINALYSIS DIPSTICK
Glucose, UA: NEGATIVE
Ketones, UA: NEGATIVE
Leukocytes, UA: NEGATIVE
Nitrite, UA: NEGATIVE
Protein, UA: POSITIVE — AB

## 2019-04-21 MED ORDER — SULFAMETHOXAZOLE-TRIMETHOPRIM 800-160 MG PO TABS: 1.0000 | ORAL_TABLET | Freq: Two times a day (BID) | ORAL | 0 refills | Status: DC

## 2019-04-21 MED ORDER — PHENAZOPYRIDINE HCL 200 MG PO TABS: 200.0000 mg | ORAL_TABLET | Freq: Three times a day (TID) | ORAL | 0 refills | Status: DC | PRN

## 2019-04-21 NOTE — Progress Notes (Signed)
Patient ID: [name], female   DOB: 1971/01/24, 48 y.o.   MRN: [id number] History of Present Illness: [name] is a 48 year old black female, married, [id number], in complaining of feeling like she needs to pee, and pulling sensation in pelvic area, started yesterday with some urgency. PCP is Dr Moshe Cipro.   Current Medications, Allergies, Past Medical History, Past Surgical History, Family History and Social History were reviewed in [employer] record.     Review of Systems: Urinary urgency and frequency and  pullin sesation in pelvic area     Physical Exam:BP 115/77 (BP Location: Left Arm, Patient Position: Sitting, Cuff Size: Normal)   Pulse 72   Ht 5\' 6"  (1.676 m)   Wt 174 lb 8 oz (79.2 kg)   LMP 03/30/2019 (Approximate)   BMI 28.17 kg/m   Urine dipstick 1+protein and trace blood. General:  Well developed, well nourished, no acute distress Skin:  Warm and dry Lungs; Clear to auscultation bilaterally Cardiovascular: Regular rate and rhythm No CVAT Pelvic:  External genitalia is normal in appearance, no lesions.  The vagina is normal in appearance. Urethra has no lesions or masses. The cervix is bulbous.  Uterus is felt to be normal size, shape, and contour.  No adnexal masses or tenderness noted.Bladder is tender, no masses felt. Psych:  No mood changes, alert and cooperative,seems happy Fall risk is low. Examination chaperoned by Weyman Croon FNP  Student.  Impression and Plan: 1. Pain with urination UA C&S sent -will rx Pyridium and septra ds Meds ordered this encounter  Medications  . phenazopyridine (PYRIDIUM) 200 MG tablet    Sig: Take 1 tablet (200 mg total) by mouth 3 (three) times daily as needed for pain.    Dispense:  10 tablet    Refill:  0    Order Specific Question:   Supervising Provider    Answer:   Elonda Husky, [name] [[id number]]  . sulfamethoxazole-trimethoprim (BACTRIM DS) 800-160 MG tablet    Sig: Take 1 tablet by mouth 2 (two) times daily. Take 1  bid    Dispense:  14 tablet    Refill:  0    Order Specific Question:   Supervising Provider    Answer:   Elonda Husky, [name] [[id number]]    2. Hematuria, unspecified type Follow up prn Has pap and physical in November

## 2019-04-22 ENCOUNTER — Ambulatory Visit (HOSPITAL_COMMUNITY)
Admission: RE | Admit: 2019-04-22 | Discharge: 2019-04-22 | Disposition: A | Discharge status: Home / Self Care | Payer: Commercial Managed Care - PPO | Source: Ambulatory Visit | Attending: Family Medicine | Admitting: Family Medicine

## 2019-04-22 DIAGNOSIS — Z1231 Encounter for screening mammogram for malignant neoplasm of breast: Secondary | ICD-10-CM | POA: Diagnosis present

## 2019-04-22 LAB — MICROSCOPIC EXAMINATION: WBC, UA: >30 /hpf — AB (ref 0–5)

## 2019-04-22 LAB — URINALYSIS, ROUTINE W REFLEX MICROSCOPIC
Bilirubin, UA: NEGATIVE
Glucose, UA: NEGATIVE
Ketones, UA: NEGATIVE
Nitrite, UA: NEGATIVE
Specific Gravity, UA: 1.023 (ref 1.005–1.030)
Urobilinogen, Ur: 0.2 mg/dL (ref 0.2–1.0)
pH, UA: 6 (ref 5.0–7.5)

## 2019-04-23 LAB — URINE CULTURE

## 2019-05-05 ENCOUNTER — Other Ambulatory Visit: Payer: Self-pay | Admitting: Family Medicine

## 2019-05-26 ENCOUNTER — Other Ambulatory Visit: Payer: Commercial Managed Care - PPO | Admitting: Adult Health

## 2019-05-30 ENCOUNTER — Encounter: Payer: Self-pay | Admitting: Family Medicine

## 2019-05-30 ENCOUNTER — Other Ambulatory Visit: Payer: Self-pay

## 2019-05-30 ENCOUNTER — Ambulatory Visit (INDEPENDENT_AMBULATORY_CARE_PROVIDER_SITE_OTHER): Payer: Commercial Managed Care - PPO | Admitting: Family Medicine

## 2019-05-30 VITALS — BP 122/70 | Ht 66.0 in | Wt 171.0 lb

## 2019-05-30 DIAGNOSIS — I82819 Embolism and thrombosis of superficial veins of unspecified lower extremities: Secondary | ICD-10-CM

## 2019-05-30 DIAGNOSIS — Z13220 Encounter for screening for lipoid disorders: Secondary | ICD-10-CM

## 2019-05-30 DIAGNOSIS — I10 Essential (primary) hypertension: Secondary | ICD-10-CM | POA: Diagnosis not present

## 2019-05-30 DIAGNOSIS — R600 Localized edema: Secondary | ICD-10-CM | POA: Diagnosis not present

## 2019-05-30 DIAGNOSIS — E663 Overweight: Secondary | ICD-10-CM

## 2019-05-30 DIAGNOSIS — G8929 Other chronic pain: Secondary | ICD-10-CM

## 2019-05-30 DIAGNOSIS — J309 Allergic rhinitis, unspecified: Secondary | ICD-10-CM

## 2019-05-30 DIAGNOSIS — E559 Vitamin D deficiency, unspecified: Secondary | ICD-10-CM

## 2019-05-30 DIAGNOSIS — M25561 Pain in right knee: Secondary | ICD-10-CM

## 2019-05-30 MED ORDER — CETIRIZINE HCL 5 MG/5ML PO SOLN: ORAL | 1 refills | Status: DC

## 2019-05-30 MED ORDER — UNABLE TO FIND: 0 refills | Status: DC

## 2019-05-30 NOTE — Progress Notes (Signed)
Virtual Visit via Telephone Note  I connected with [name] on 05/30/19 at 10:20 AM EST by telephone and verified that I am speaking with the correct person using two identifiers.  Location: Patient: work Provider: office   I discussed the limitations, risks, security and privacy concerns of performing an evaluation and management service by telephone and the availability of in person appointments. I also discussed with the patient that there may be a patient responsible charge related to this service. The patient expressed understanding and agreed to proceed.   History of Present Illness: F/u chronic problems, still has knee pain and is working on imaging, but cost currently prohibitive C/o swollen superficial leg veins, needs to get compression hose Denies recent fever or chills. Denies sinus pressure, nasal congestion, ear pain or sore throat. Denies chest congestion, productive cough or wheezing. Denies chest pains, palpitations and leg swelling Denies abdominal pain, nausea, vomiting,diarrhea or constipation.   Denies dysuria, frequency, hesitancy or incontinence.  Denies headaches, seizures, numbness, or tingling. Denies depression, anxiety or insomnia. Denies skin break down or rash.       Observations/Objective: BP 122/70   Ht 5\' 6"  (1.676 m)   Wt 171 lb (77.6 kg)   BMI 27.60 kg/m  Good communication with no confusion and intact memory. Alert and oriented x 3 No signs of respiratory distress during speech    Assessment and Plan: Chronic superficial venous thrombosis of lower extremity Advised wearing compression hose  And elevating legs  Essential hypertension Controlled, no change in medication DASH diet and commitment to daily physical activity for a minimum of 30 minutes discussed and encouraged, as a part of hypertension management. The importance of attaining a healthy weight is also discussed.  BP/Weight 05/30/2019 04/21/2019 03/31/2019 02/02/2019  01/25/2019 01/21/2019 0000000  Systolic BP [id number] -  Diastolic BP 70 77 70 77 80 85 -  Wt. (Lbs) 171 174.5 171.12 175 175 175 175  BMI 27.6 28.17 27.62 27.41 28.25 28.25 28.25       Overweight (BMI 25.0-29.9)  Patient re-educated about  the importance of commitment to a  minimum of 150 minutes of exercise per week as able.  The importance of healthy food choices with portion control discussed, as well as eating regularly and within a 12 hour window most days. The need to choose "clean , green" food 50 to 75% of the time is discussed, as well as to make water the primary drink and set a goal of 64 ounces water daily.    Weight /BMI 05/30/2019 04/21/2019 03/31/2019  WEIGHT 171 lb 174 lb 8 oz 171 lb 1.9 oz  HEIGHT 5\' 6"  5\' 6"  5\' 6"   BMI 27.6 kg/m2 28.17 kg/m2 27.62 kg/m2      Pain in right knee Ongoing assessment by Ortho, MRI recommended holding off dur to cost  Allergic rhinitis Controlled, no change in medication     Follow Up Instructions:    I discussed the assessment and treatment plan with the patient. The patient was provided an opportunity to ask questions and all were answered. The patient agreed with the plan and demonstrated an understanding of the instructions.   The patient was advised to call back or seek an in-person evaluation if the symptoms worsen or if the condition fails to improve as anticipated.  I provided 22 minutes of non-face-to-face time during this encounter.   Tula Nakayama, MD

## 2019-05-30 NOTE — Patient Instructions (Addendum)
F/u WITH MD IN OFFICE IN 6 MONTHS, CALL IF YOU NED ME SOONER   Please get fasting cBC, lipid, cmp and eGFr , tSh and vit D 2nd week in January  Compression hose , 1 pair is prescribed and sent to [facility] for your superficial vein swelling and leg pain. It is important that you wear this regularly as well as elevate your legs  It is important that you exercise regularly at least 30 minutes 5 times a week. If you develop chest pain, have severe difficulty breathing, or feel very tired, stop exercising immediately and seek medical attention  Think about what you will eat, plan ahead. Choose " clean, green, fresh or frozen" over canned, processed or packaged foods which are more sugary, salty and fatty. 70 to 75% of food eaten should be vegetables and fruit. Three meals at set times with snacks allowed between meals, but they must be fruit or vegetables. Aim to eat over a 12 hour period , example 7 am to 7 pm, and STOP after  your last meal of the day. Drink water,generally about 64 ounces per day, no other drink is as healthy. Fruit juice is best enjoyed in a healthy way, by EATING the fruit. Thanks for choosing [facility], we consider it a privelige to serve you.

## 2019-06-05 ENCOUNTER — Encounter: Payer: Self-pay | Admitting: Family Medicine

## 2019-06-05 DIAGNOSIS — I82819 Embolism and thrombosis of superficial veins of unspecified lower extremities: Secondary | ICD-10-CM | POA: Insufficient documentation

## 2019-06-05 NOTE — Assessment & Plan Note (Signed)
  Patient re-educated about  the importance of commitment to a  minimum of 150 minutes of exercise per week as able.  The importance of healthy food choices with portion control discussed, as well as eating regularly and within a 12 hour window most days. The need to choose "clean , green" food 50 to 75% of the time is discussed, as well as to make water the primary drink and set a goal of 64 ounces water daily.    Weight /BMI 05/30/2019 04/21/2019 03/31/2019  WEIGHT 171 lb 174 lb 8 oz 171 lb 1.9 oz  HEIGHT 5\' 6"  5\' 6"  5\' 6"   BMI 27.6 kg/m2 28.17 kg/m2 27.62 kg/m2

## 2019-06-05 NOTE — Assessment & Plan Note (Signed)
Ongoing assessment by Ortho, MRI recommended holding off dur to cost

## 2019-06-05 NOTE — Assessment & Plan Note (Signed)
Controlled, no change in medication  

## 2019-06-05 NOTE — Assessment & Plan Note (Signed)
Advised wearing compression hose  And elevating legs

## 2019-06-05 NOTE — Assessment & Plan Note (Signed)
Controlled, no change in medication DASH diet and commitment to daily physical activity for a minimum of 30 minutes discussed and encouraged, as a part of hypertension management. The importance of attaining a healthy weight is also discussed.  BP/Weight 05/30/2019 04/21/2019 03/31/2019 02/02/2019 01/25/2019 01/21/2019 0000000  Systolic BP [id number] 123456 123456 [id number] -  Diastolic BP 70 77 70 77 80 85 -  Wt. (Lbs) 171 174.5 171.12 175 175 175 175  BMI 27.6 28.17 27.62 27.41 28.25 28.25 28.25

## 2019-06-27 ENCOUNTER — Other Ambulatory Visit: Payer: Self-pay | Admitting: Family Medicine

## 2019-08-01 ENCOUNTER — Encounter: Payer: Self-pay | Admitting: Family Medicine

## 2019-08-03 ENCOUNTER — Encounter: Payer: Self-pay | Admitting: Family Medicine

## 2019-08-04 ENCOUNTER — Other Ambulatory Visit: Payer: Self-pay | Admitting: Family Medicine

## 2019-08-04 ENCOUNTER — Other Ambulatory Visit: Payer: Self-pay

## 2019-08-04 ENCOUNTER — Encounter: Payer: Self-pay | Admitting: Adult Health

## 2019-08-04 ENCOUNTER — Ambulatory Visit (INDEPENDENT_AMBULATORY_CARE_PROVIDER_SITE_OTHER): Payer: Commercial Managed Care - PPO | Admitting: Adult Health

## 2019-08-04 VITALS — BP 118/78 | HR 61 | Ht 66.5 in | Wt 176.4 lb

## 2019-08-04 DIAGNOSIS — Z01419 Encounter for gynecological examination (general) (routine) without abnormal findings: Secondary | ICD-10-CM | POA: Diagnosis not present

## 2019-08-04 DIAGNOSIS — Z1211 Encounter for screening for malignant neoplasm of colon: Secondary | ICD-10-CM

## 2019-08-04 DIAGNOSIS — Z1212 Encounter for screening for malignant neoplasm of rectum: Secondary | ICD-10-CM

## 2019-08-04 DIAGNOSIS — R519 Headache, unspecified: Secondary | ICD-10-CM

## 2019-08-04 DIAGNOSIS — Z8249 Family history of ischemic heart disease and other diseases of the circulatory system: Secondary | ICD-10-CM

## 2019-08-04 LAB — HEMOCCULT GUIAC POC 1CARD (OFFICE): Fecal Occult Blood, POC: NEGATIVE

## 2019-08-04 NOTE — Progress Notes (Signed)
Patient ID: [name], female   DOB: May 28, 1971, 49 y.o.   MRN: [id number] History of Present Illness: Tanaya is a 49 year old black female, married, [id number], in for a well woman gyn exam, she had a normal pap with negative HPV 01/28/18. PCP is Dr Moshe Cipro.   Current Medications, Allergies, Past Medical History, Past Surgical History, Family History and Social History were reviewed in [employer] record.     Review of Systems: Patient denies any headaches, hearing loss, fatigue, blurred vision, shortness of breath, chest pain, abdominal pain, problems with bowel movements, urination, or intercourse. No joint pain or mood swings.    Physical Exam:BP 118/78 (BP Location: Left Arm, Patient Position: Sitting, Cuff Size: Normal)   Pulse 61   Ht 5' 6.5" (1.689 m)   Wt 176 lb 6.4 oz (80 kg)   LMP 07/21/2019 (Exact Date)   BMI 28.05 kg/m  General:  Well developed, well nourished, no acute distress Skin:  Warm and dry Neck:  Midline trachea, normal thyroid, good ROM, no lymphadenopathy Lungs; Clear to auscultation bilaterally Breast:  No dominant palpable mass, retraction, or nipple discharge Cardiovascular: Regular rate and rhythm Abdomen:  Soft, non tender, no hepatosplenomegaly Pelvic:  External genitalia is normal in appearance, no lesions.  The vagina is normal in appearance. Urethra has no lesions or masses. The cervix is bulbous.  Uterus is felt to be normal size, shape, and contour.  No adnexal masses or tenderness noted.Bladder is non tender, no masses felt. Rectal: Good sphincter tone, no polyps, or hemorrhoids felt.  Hemoccult negative. Extremities/musculoskeletal:  No swelling or varicosities noted, no clubbing or cyanosis Psych:  No mood changes, alert and cooperative,seems happy Fall risk is low PHQ 2 score is 0. Examination chaperoned by Levy Pupa LPN.  Impression and Plan: 1. Encounter for well woman exam with routine gynecological exam Pap and  physical in 1 year Mammogram yearly Labs with PCP   2. Screening for colorectal cancer Colonoscopy at 46

## 2019-08-08 ENCOUNTER — Telehealth: Payer: Commercial Managed Care - PPO | Admitting: Family Medicine

## 2019-08-08 ENCOUNTER — Other Ambulatory Visit: Payer: Self-pay

## 2019-09-29 ENCOUNTER — Other Ambulatory Visit: Payer: Self-pay | Admitting: Family Medicine

## 2019-10-20 ENCOUNTER — Other Ambulatory Visit: Payer: Self-pay | Admitting: Family Medicine

## 2019-10-27 ENCOUNTER — Telehealth: Payer: Self-pay

## 2019-10-27 NOTE — Telephone Encounter (Signed)
Pt is calling in wants to know what covid injection she should take moderma or pfizer, she does have a epipen I advised her to call the allergist, however she said Dr Moshe Cipro gave her the Epi pen, and she hasnt seen the allergist in 6 years

## 2019-10-27 NOTE — Telephone Encounter (Signed)
Please advise 

## 2019-10-27 NOTE — Telephone Encounter (Signed)
Either one as in both are approved for her to take, it does not matter which one. She SHOULD take the  vaccine, needs to wait in observation area , for 15 to 30 minutes , and as always have her epi pen with her

## 2019-10-27 NOTE — Telephone Encounter (Signed)
Message left making her aware

## 2019-11-01 ENCOUNTER — Other Ambulatory Visit: Payer: Self-pay | Admitting: *Deleted

## 2019-11-01 MED ORDER — HYDROCHLOROTHIAZIDE 25 MG PO TABS: 25.0000 mg | ORAL_TABLET | Freq: Every day | ORAL | 1 refills | Status: DC

## 2019-11-16 ENCOUNTER — Telehealth: Payer: Self-pay | Admitting: Adult Health

## 2019-11-16 MED ORDER — FLUCONAZOLE 150 MG PO TABS: ORAL_TABLET | ORAL | 1 refills | Status: DC

## 2019-11-16 MED ORDER — METRONIDAZOLE 500 MG PO TABS: 500.0000 mg | ORAL_TABLET | Freq: Two times a day (BID) | ORAL | 0 refills | Status: DC

## 2019-11-16 NOTE — Telephone Encounter (Signed)
Pt has BV, requests flagyl and a diflucan,sent to [facility] ,

## 2019-11-17 ENCOUNTER — Ambulatory Visit: Payer: Commercial Managed Care - PPO | Admitting: Adult Health

## 2019-11-25 ENCOUNTER — Ambulatory Visit: Payer: Commercial Managed Care - PPO | Admitting: Allergy & Immunology

## 2019-11-26 ENCOUNTER — Other Ambulatory Visit: Payer: Self-pay | Admitting: Family Medicine

## 2019-11-28 ENCOUNTER — Other Ambulatory Visit: Payer: Self-pay | Admitting: *Deleted

## 2019-11-28 ENCOUNTER — Ambulatory Visit: Payer: Commercial Managed Care - PPO | Admitting: Allergy & Immunology

## 2019-11-28 ENCOUNTER — Ambulatory Visit: Payer: Commercial Managed Care - PPO | Admitting: Family Medicine

## 2019-11-28 MED ORDER — HYDROCHLOROTHIAZIDE 25 MG PO TABS: 25.0000 mg | ORAL_TABLET | Freq: Every day | ORAL | 1 refills | Status: DC

## 2019-11-28 MED ORDER — METOPROLOL TARTRATE 25 MG PO TABS: ORAL_TABLET | ORAL | 0 refills | Status: DC

## 2019-12-07 ENCOUNTER — Emergency Department (HOSPITAL_COMMUNITY): Payer: Commercial Managed Care - PPO

## 2019-12-07 ENCOUNTER — Encounter (HOSPITAL_COMMUNITY): Payer: Self-pay

## 2019-12-07 ENCOUNTER — Other Ambulatory Visit: Payer: Self-pay

## 2019-12-07 ENCOUNTER — Emergency Department (HOSPITAL_COMMUNITY)
Admission: EM | Admit: 2019-12-07 | Discharge: 2019-12-07 | Disposition: A | Discharge status: Home / Self Care | Payer: Commercial Managed Care - PPO | Attending: Emergency Medicine | Admitting: Emergency Medicine

## 2019-12-07 DIAGNOSIS — W19XXXA Unspecified fall, initial encounter: Secondary | ICD-10-CM | POA: Insufficient documentation

## 2019-12-07 DIAGNOSIS — Y939 Activity, unspecified: Secondary | ICD-10-CM | POA: Diagnosis not present

## 2019-12-07 DIAGNOSIS — M545 Low back pain: Secondary | ICD-10-CM | POA: Insufficient documentation

## 2019-12-07 DIAGNOSIS — Y929 Unspecified place or not applicable: Secondary | ICD-10-CM | POA: Insufficient documentation

## 2019-12-07 DIAGNOSIS — M25512 Pain in left shoulder: Secondary | ICD-10-CM | POA: Diagnosis not present

## 2019-12-07 DIAGNOSIS — Y999 Unspecified external cause status: Secondary | ICD-10-CM | POA: Diagnosis not present

## 2019-12-07 DIAGNOSIS — M25551 Pain in right hip: Secondary | ICD-10-CM | POA: Insufficient documentation

## 2019-12-07 DIAGNOSIS — M542 Cervicalgia: Secondary | ICD-10-CM | POA: Insufficient documentation

## 2019-12-07 MED ORDER — HYDROCODONE-ACETAMINOPHEN 5-325 MG PO TABS: 1.0000 | ORAL_TABLET | Freq: Four times a day (QID) | ORAL | 0 refills | Status: DC | PRN

## 2019-12-07 MED ORDER — HYDROCODONE-ACETAMINOPHEN 5-325 MG PO TABS: 1.0000 | ORAL_TABLET | Freq: Once | ORAL | Status: DC
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
## 2019-12-07 NOTE — ED Notes (Signed)
Pt. With x-ray. 

## 2019-12-07 NOTE — Discharge Instructions (Addendum)
Follow-up with your family doctor if any problem 
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
## 2019-12-14 ENCOUNTER — Ambulatory Visit: Payer: Commercial Managed Care - PPO | Admitting: Family Medicine

## 2019-12-26 ENCOUNTER — Other Ambulatory Visit: Payer: Self-pay | Admitting: *Deleted

## 2019-12-26 ENCOUNTER — Telehealth: Payer: Self-pay

## 2019-12-26 DIAGNOSIS — I10 Essential (primary) hypertension: Secondary | ICD-10-CM

## 2019-12-26 DIAGNOSIS — R7303 Prediabetes: Secondary | ICD-10-CM

## 2019-12-26 DIAGNOSIS — E559 Vitamin D deficiency, unspecified: Secondary | ICD-10-CM

## 2019-12-26 NOTE — Telephone Encounter (Signed)
Lab work ordered and [facility]

## 2019-12-26 NOTE — Telephone Encounter (Signed)
Please release lab work for the pat ---to [employer]

## 2019-12-27 ENCOUNTER — Other Ambulatory Visit: Payer: Self-pay

## 2019-12-27 ENCOUNTER — Encounter: Payer: Self-pay | Admitting: Family Medicine

## 2019-12-27 ENCOUNTER — Ambulatory Visit (INDEPENDENT_AMBULATORY_CARE_PROVIDER_SITE_OTHER): Payer: Commercial Managed Care - PPO | Admitting: Family Medicine

## 2019-12-27 VITALS — BP 136/86 | HR 75 | Temp 97.6°F | Resp 16 | Ht 67.0 in | Wt 174.1 lb

## 2019-12-27 DIAGNOSIS — M545 Low back pain: Secondary | ICD-10-CM | POA: Diagnosis not present

## 2019-12-27 DIAGNOSIS — G8929 Other chronic pain: Secondary | ICD-10-CM

## 2019-12-27 DIAGNOSIS — M25512 Pain in left shoulder: Secondary | ICD-10-CM

## 2019-12-27 DIAGNOSIS — E663 Overweight: Secondary | ICD-10-CM

## 2019-12-27 DIAGNOSIS — E876 Hypokalemia: Secondary | ICD-10-CM

## 2019-12-27 DIAGNOSIS — M25511 Pain in right shoulder: Secondary | ICD-10-CM | POA: Insufficient documentation

## 2019-12-27 DIAGNOSIS — I10 Essential (primary) hypertension: Secondary | ICD-10-CM

## 2019-12-27 DIAGNOSIS — M549 Dorsalgia, unspecified: Secondary | ICD-10-CM | POA: Insufficient documentation

## 2019-12-27 MED ORDER — PANTOPRAZOLE SODIUM 40 MG PO TBEC
40.0000 mg | DELAYED_RELEASE_TABLET | Freq: Every day | ORAL | 0 refills | Status: DC
Start: 2019-12-27 — End: 2020-11-21

## 2019-12-27 MED ORDER — KETOROLAC TROMETHAMINE 60 MG/2ML IM SOLN
60.0000 mg | Freq: Once | INTRAMUSCULAR | Status: AC
  Administered 2019-12-27: 60 mg via INTRAMUSCULAR

## 2019-12-27 MED ORDER — PREDNISONE 5 MG (21) PO TBPK: 5.0000 mg | ORAL_TABLET | ORAL | 0 refills | Status: DC

## 2019-12-27 MED ORDER — CYCLOBENZAPRINE HCL 10 MG PO TABS: 10.0000 mg | ORAL_TABLET | Freq: Every day | ORAL | 0 refills | Status: DC

## 2019-12-27 MED ORDER — METHYLPREDNISOLONE ACETATE 80 MG/ML IJ SUSP
80.0000 mg | Freq: Once | INTRAMUSCULAR | Status: AC
  Administered 2019-12-27: 80 mg via INTRAMUSCULAR

## 2019-12-27 MED ORDER — IBUPROFEN 600 MG PO TABS: ORAL_TABLET | ORAL | 0 refills | Status: DC
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
## 2019-12-28 ENCOUNTER — Other Ambulatory Visit: Payer: Self-pay

## 2019-12-28 ENCOUNTER — Encounter: Payer: Self-pay | Admitting: Family Medicine

## 2019-12-28 ENCOUNTER — Other Ambulatory Visit: Payer: Self-pay | Admitting: Family Medicine

## 2019-12-28 DIAGNOSIS — E876 Hypokalemia: Secondary | ICD-10-CM

## 2019-12-28 LAB — LIPID PANEL
Cholesterol: 153 mg/dL (ref <200)
HDL: 37 mg/dL — ABNORMAL LOW (ref >=50)
LDL Cholesterol (Calc): 95 mg/dL (calc)
Non-HDL Cholesterol (Calc): 116 mg/dL (calc) (ref <130)
Total CHOL/HDL Ratio: 4.1 (calc) (ref <5.0)
Triglycerides: 110 mg/dL (ref <150)

## 2019-12-28 LAB — HEPATITIS C ANTIBODY
Hepatitis C Ab: NONREACTIVE
SIGNAL TO CUT-OFF: 0.02 (ref <1.00)

## 2019-12-28 LAB — CBC
HCT: 39.6 % (ref 35.0–45.0)
Hemoglobin: 12.4 g/dL (ref 11.7–15.5)
MCH: 21.9 pg — ABNORMAL LOW (ref 27.0–33.0)
MCHC: 31.3 g/dL — ABNORMAL LOW (ref 32.0–36.0)
MCV: 69.8 fL — ABNORMAL LOW (ref 80.0–100.0)
MPV: 12.1 fL (ref 7.5–12.5)
Platelets: 366 10*3/uL (ref 140–400)
RBC: 5.67 10*6/uL — ABNORMAL HIGH (ref 3.80–5.10)
RDW: 15.4 % — ABNORMAL HIGH (ref 11.0–15.0)
WBC: 8.4 10*3/uL (ref 3.8–10.8)

## 2019-12-28 LAB — COMPLETE METABOLIC PANEL WITH GFR
AG Ratio: 1.4 (calc) (ref 1.0–2.5)
ALT: 14 U/L (ref 6–29)
AST: 15 U/L (ref 10–35)
Albumin: 4.2 g/dL (ref 3.6–5.1)
Alkaline phosphatase (APISO): 90 U/L (ref 31–125)
BUN: 11 mg/dL (ref 7–25)
CO2: 32 mmol/L (ref 20–32)
Calcium: 9.4 mg/dL (ref 8.6–10.2)
Chloride: 99 mmol/L (ref 98–110)
Creat: 0.91 mg/dL (ref 0.50–1.10)
GFR, Est African American: 86 mL/min/{1.73_m2} (ref >=60)
GFR, Est Non African American: 75 mL/min/{1.73_m2} (ref >=60)
Globulin: 3.1 g/dL (calc) (ref 1.9–3.7)
Glucose, Bld: 82 mg/dL (ref 65–99)
Potassium: 2.8 mmol/L — ABNORMAL LOW (ref 3.5–5.3)
Sodium: 139 mmol/L (ref 135–146)
Total Bilirubin: 0.5 mg/dL (ref 0.2–1.2)
Total Protein: 7.3 g/dL (ref 6.1–8.1)

## 2019-12-28 LAB — TEST AUTHORIZATION

## 2019-12-28 LAB — TSH: TSH: 1.97 mIU/L

## 2019-12-28 LAB — VITAMIN D 25 HYDROXY (VIT D DEFICIENCY, FRACTURES): Vit D, 25-Hydroxy: 14 ng/mL — ABNORMAL LOW (ref 30–100)

## 2019-12-28 MED ORDER — ERGOCALCIFEROL 1.25 MG (50000 UT) PO CAPS: 50000.0000 [IU] | ORAL_CAPSULE | ORAL | 2 refills | Status: DC

## 2019-12-28 MED ORDER — POTASSIUM CHLORIDE CRYS ER 20 MEQ PO TBCR: EXTENDED_RELEASE_TABLET | ORAL | 5 refills | Status: DC

## 2019-12-28 NOTE — Assessment & Plan Note (Signed)
  Patient re-educated about  the importance of commitment to a  minimum of 150 minutes of exercise per week as able.  The importance of healthy food choices with portion control discussed, as well as eating regularly and within a 12 hour window most days. The need to choose "clean , green" food 50 to 75% of the time is discussed, as well as to make water the primary drink and set a goal of 64 ounces water daily.    Weight /BMI 12/27/2019 12/07/2019 08/04/2019  WEIGHT 174 lb 1.3 oz 170 lb 176 lb 6.4 oz  HEIGHT 5\' 7"  5\' 7"  5' 6.5"  BMI 27.26 kg/m2 26.63 kg/m2 28.05 kg/m2

## 2019-12-28 NOTE — Assessment & Plan Note (Signed)
Chronic problem , needs aggressive supplementation, and review lab in  2 to  4 weeks

## 2019-12-28 NOTE — Assessment & Plan Note (Signed)
Controlled, no change in medication DASH diet and commitment to daily physical activity for a minimum of 30 minutes discussed and encouraged, as a part of hypertension management. The importance of attaining a healthy weight is also discussed.  BP/Weight 12/27/2019 12/07/2019 08/04/2019 05/30/2019 04/21/2019 03/31/2019 12/22/4857  Systolic BP 276 394 320 037 944 461 901  Diastolic BP 86 75 78 70 77 70 77  Wt. (Lbs) 174.08 170 176.4 171 174.5 171.12 175  BMI 27.26 26.63 28.05 27.6 28.17 27.62 27.41

## 2019-12-28 NOTE — Assessment & Plan Note (Signed)
Uncontrolled.Toradol and depo medrol administered IM in the office , to be followed by a short course of oral prednisone and NSAIDS. Refer to pT and work excuse x 3 days, aggravated by trauma when fell out of truck and rolled over x 3

## 2019-12-28 NOTE — Assessment & Plan Note (Signed)
Localized, refer to pT and anti inflammatory course

## 2019-12-30 ENCOUNTER — Encounter: Payer: Self-pay | Admitting: Family Medicine

## 2019-12-30 NOTE — Telephone Encounter (Signed)
Work note extended

## 2020-02-06 ENCOUNTER — Encounter: Payer: Self-pay | Admitting: Family Medicine

## 2020-02-07 ENCOUNTER — Other Ambulatory Visit: Payer: Self-pay

## 2020-02-07 DIAGNOSIS — E876 Hypokalemia: Secondary | ICD-10-CM

## 2020-02-13 ENCOUNTER — Ambulatory Visit: Payer: Commercial Managed Care - PPO | Admitting: Family Medicine

## 2020-03-07 ENCOUNTER — Telehealth (INDEPENDENT_AMBULATORY_CARE_PROVIDER_SITE_OTHER): Payer: Commercial Managed Care - PPO | Admitting: Family Medicine

## 2020-03-07 ENCOUNTER — Other Ambulatory Visit: Payer: Self-pay

## 2020-03-07 ENCOUNTER — Encounter: Payer: Self-pay | Admitting: Family Medicine

## 2020-03-07 VITALS — BP 136/86 | Ht 66.0 in | Wt 174.0 lb

## 2020-03-07 DIAGNOSIS — M25512 Pain in left shoulder: Secondary | ICD-10-CM

## 2020-03-07 DIAGNOSIS — I10 Essential (primary) hypertension: Secondary | ICD-10-CM

## 2020-03-07 DIAGNOSIS — M25511 Pain in right shoulder: Secondary | ICD-10-CM | POA: Diagnosis not present

## 2020-03-07 DIAGNOSIS — E559 Vitamin D deficiency, unspecified: Secondary | ICD-10-CM

## 2020-03-07 DIAGNOSIS — L84 Corns and callosities: Secondary | ICD-10-CM | POA: Insufficient documentation

## 2020-03-07 DIAGNOSIS — M545 Low back pain, unspecified: Secondary | ICD-10-CM

## 2020-03-07 DIAGNOSIS — E663 Overweight: Secondary | ICD-10-CM

## 2020-03-07 DIAGNOSIS — G8929 Other chronic pain: Secondary | ICD-10-CM

## 2020-03-07 DIAGNOSIS — D229 Melanocytic nevi, unspecified: Secondary | ICD-10-CM | POA: Diagnosis not present

## 2020-03-07 DIAGNOSIS — E876 Hypokalemia: Secondary | ICD-10-CM

## 2020-03-07 NOTE — Assessment & Plan Note (Addendum)
Bilateral foot callus, painful, refer podiatry

## 2020-03-07 NOTE — Progress Notes (Signed)
Virtual Visit via Telephone Note  I connected with Letetia Romanello on 03/07/20 at  2:40 PM EDT by telephone and verified that I am speaking with the correct person using two identifiers.  Location: Patient: work Provider: office   I discussed the limitations, risks, security and privacy concerns of performing an evaluation and management service by telephone and the availability of in person appointments. I also discussed with the patient that there may be a patient responsible charge related to this service. The patient expressed understanding and agreed to proceed.   History of Present Illness: Mole in right armpit x 1 year Corn on both soles of feet, painful, wants removal Low back pain and bilateral shoulder pain   Observations/Objective: BP 136/86   Ht 5\' 6"  (1.676 m)   Wt 174 lb (78.9 kg)   BMI 28.08 kg/m  Good communication with no confusion and intact memory. Alert and oriented x 3 No signs of respiratory distress during speech    Assessment and Plan:  Back pain Refer PT, chronic back pain, limits activity, worse since accident  Shoulder pain, bilateral Refer PT reports limitation in mobility and chronic pain  Callus of foot Bilateral foot callus, painful, refer podiatry  Benign skin mole One year h/o flesh colored mole/ skin tag in right armpit which is a nuisance, refer for removal  HYPOKALEMIA Updated lab is overdue, pt advised again of importance of this lab work  Essential hypertension Controlled, no change in medication DASH diet and commitment to daily physical activity for a minimum of 30 minutes discussed and encouraged, as a part of hypertension management. The importance of attaining a healthy weight is also discussed.  BP/Weight 03/07/2020 12/27/2019 12/07/2019 08/04/2019 05/30/2019 04/21/2019 0/17/4944  Systolic BP 967 591 638 466 599 357 017  Diastolic BP 86 86 75 78 70 77 70  Wt. (Lbs) 174 174.08 170 176.4 171 174.5 171.12  BMI 28.08 27.26 26.63  28.05 27.6 28.17 27.62       Overweight (BMI 25.0-29.9)  Patient re-educated about  the importance of commitment to a  minimum of 150 minutes of exercise per week as able.  The importance of healthy food choices with portion control discussed, as well as eating regularly and within a 12 hour window most days. The need to choose "clean , green" food 50 to 75% of the time is discussed, as well as to make water the primary drink and set a goal of 64 ounces water daily.    Weight /BMI 03/07/2020 12/27/2019 12/07/2019  WEIGHT 174 lb 174 lb 1.3 oz 170 lb  HEIGHT 5\' 6"  5\' 7"  5\' 7"   BMI 28.08 kg/m2 27.26 kg/m2 26.63 kg/m2       Follow Up Instructions:    I discussed the assessment and treatment plan with the patient. The patient was provided an opportunity to ask questions and all were answered. The patient agreed with the plan and demonstrated an understanding of the instructions.   The patient was advised to call back or seek an in-person evaluation if the symptoms worsen or if the condition fails to improve as anticipated.  I provided 15 minutes of non-face-to-face time during this encounter.   Tula Nakayama, MD

## 2020-03-07 NOTE — Patient Instructions (Signed)
F/U in office with MD in 6 months, call if you need me sooner  You are referred to Dermatology, Podiatry and physical therapy  Please get non fasting chem7 and EGFR and vitamin D level this week , lab order sent to [facility] as you need to get lab draw before 8, very important as your potassium is low  After your 2nd covid vaccine, please get flu shot  It is important that you exercise regularly at least 30 minutes 5 times a week. If you develop chest pain, have severe difficulty breathing, or feel very tired, stop exercising immediately and seek medical attention  Think about what you will eat, plan ahead. Choose " clean, green, fresh or frozen" over canned, processed or packaged foods which are more sugary, salty and fatty. 70 to 75% of food eaten should be vegetables and fruit. Three meals at set times with snacks allowed between meals, but they must be fruit or vegetables. Aim to eat over a 12 hour period , example 7 am to 7 pm, and STOP after  your last meal of the day. Drink water,generally about 64 ounces per day, no other drink is as healthy. Fruit juice is best enjoyed in a healthy way, by EATING the fruit. Thanks for choosing  [facility], we consider it a privelige to serve you.  

## 2020-03-07 NOTE — Assessment & Plan Note (Addendum)
Refer PT, chronic back pain, limits activity, worse since accident

## 2020-03-07 NOTE — Assessment & Plan Note (Addendum)
Refer PT reports limitation in mobility and chronic pain

## 2020-03-12 ENCOUNTER — Encounter: Payer: Self-pay | Admitting: Family Medicine

## 2020-03-12 NOTE — Assessment & Plan Note (Signed)
Controlled, no change in medication DASH diet and commitment to daily physical activity for a minimum of 30 minutes discussed and encouraged, as a part of hypertension management. The importance of attaining a healthy weight is also discussed.  BP/Weight 03/07/2020 12/27/2019 12/07/2019 08/04/2019 05/30/2019 04/21/2019 4/32/0037  Systolic BP 944 461 901 222 411 464 314  Diastolic BP 86 86 75 78 70 77 70  Wt. (Lbs) 174 174.08 170 176.4 171 174.5 171.12  BMI 28.08 27.26 26.63 28.05 27.6 28.17 27.62

## 2020-03-12 NOTE — Assessment & Plan Note (Signed)
  Patient re-educated about  the importance of commitment to a  minimum of 150 minutes of exercise per week as able.  The importance of healthy food choices with portion control discussed, as well as eating regularly and within a 12 hour window most days. The need to choose "clean , green" food 50 to 75% of the time is discussed, as well as to make water the primary drink and set a goal of 64 ounces water daily.    Weight /BMI 03/07/2020 12/27/2019 12/07/2019  WEIGHT 174 lb 174 lb 1.3 oz 170 lb  HEIGHT 5\' 6"  5\' 7"  5\' 7"   BMI 28.08 kg/m2 27.26 kg/m2 26.63 kg/m2

## 2020-03-12 NOTE — Assessment & Plan Note (Signed)
Updated lab is overdue, pt advised again of importance of this lab work

## 2020-03-12 NOTE — Assessment & Plan Note (Addendum)
One year h/o flesh colored mole/ skin tag in right armpit which is a nuisance, refer for removal

## 2020-03-26 ENCOUNTER — Encounter: Payer: Self-pay | Admitting: Orthopedic Surgery

## 2020-03-26 ENCOUNTER — Ambulatory Visit: Payer: Commercial Managed Care - PPO | Admitting: Podiatry

## 2020-03-26 ENCOUNTER — Ambulatory Visit: Payer: Commercial Managed Care - PPO | Admitting: Orthopedic Surgery

## 2020-04-19 ENCOUNTER — Other Ambulatory Visit: Payer: Self-pay | Admitting: Family Medicine

## 2020-05-11 ENCOUNTER — Telehealth: Payer: Self-pay | Admitting: Adult Health

## 2020-05-11 ENCOUNTER — Telehealth: Payer: Self-pay | Admitting: Obstetrics & Gynecology

## 2020-05-11 MED ORDER — METRONIDAZOLE 0.75 % VA GEL: VAGINAL | 0 refills | Status: DC

## 2020-05-11 MED ORDER — FLUCONAZOLE 150 MG PO TABS: ORAL_TABLET | ORAL | 1 refills | Status: DC

## 2020-05-11 NOTE — Telephone Encounter (Signed)
Pt requesting meds for BV & yeast infection States we've ordered for her before   Please advise & notify pt   [facility]

## 2020-05-11 NOTE — Telephone Encounter (Signed)
Pt aware med was sent to pharmacy. JSY °

## 2020-06-11 ENCOUNTER — Telehealth: Payer: Self-pay | Admitting: Family Medicine

## 2020-06-11 NOTE — Telephone Encounter (Signed)
Patient called in for left leg pain numbness. I offered next available with Dr Moshe Cipro 12/15.  She said she could not wait and did not want to see no other physician.  She did not want to schedule this appointment. Can you please call the patient [phone].

## 2020-06-11 NOTE — Telephone Encounter (Signed)
Pt made an appt with Dr Moshe Cipro tomorrow at 2:20

## 2020-06-12 ENCOUNTER — Ambulatory Visit: Payer: Commercial Managed Care - PPO | Admitting: Family Medicine

## 2020-06-14 ENCOUNTER — Other Ambulatory Visit: Payer: Self-pay | Admitting: Family Medicine

## 2020-06-15 ENCOUNTER — Other Ambulatory Visit (HOSPITAL_COMMUNITY): Payer: Self-pay | Admitting: Family Medicine

## 2020-06-15 DIAGNOSIS — Z1231 Encounter for screening mammogram for malignant neoplasm of breast: Secondary | ICD-10-CM

## 2020-06-28 ENCOUNTER — Other Ambulatory Visit: Payer: Self-pay

## 2020-06-28 ENCOUNTER — Ambulatory Visit (HOSPITAL_COMMUNITY)
Admission: RE | Admit: 2020-06-28 | Discharge: 2020-06-28 | Disposition: A | Discharge status: Home / Self Care | Payer: Commercial Managed Care - PPO | Source: Ambulatory Visit | Attending: Family Medicine | Admitting: Family Medicine

## 2020-06-28 DIAGNOSIS — Z1231 Encounter for screening mammogram for malignant neoplasm of breast: Secondary | ICD-10-CM | POA: Diagnosis not present

## 2020-07-26 ENCOUNTER — Other Ambulatory Visit: Payer: Self-pay | Admitting: Family Medicine

## 2020-08-13 ENCOUNTER — Telehealth (INDEPENDENT_AMBULATORY_CARE_PROVIDER_SITE_OTHER): Payer: Commercial Managed Care - PPO | Admitting: Family Medicine

## 2020-08-13 ENCOUNTER — Encounter: Payer: Self-pay | Admitting: Family Medicine

## 2020-08-13 ENCOUNTER — Other Ambulatory Visit: Payer: Self-pay

## 2020-08-13 VITALS — Ht 66.0 in | Wt 174.0 lb

## 2020-08-13 DIAGNOSIS — I10 Essential (primary) hypertension: Secondary | ICD-10-CM | POA: Diagnosis not present

## 2020-08-13 DIAGNOSIS — J209 Acute bronchitis, unspecified: Secondary | ICD-10-CM | POA: Diagnosis not present

## 2020-08-13 DIAGNOSIS — J0110 Acute frontal sinusitis, unspecified: Secondary | ICD-10-CM | POA: Diagnosis not present

## 2020-08-13 DIAGNOSIS — E876 Hypokalemia: Secondary | ICD-10-CM | POA: Diagnosis not present

## 2020-08-13 MED ORDER — FLUCONAZOLE 150 MG PO TABS: 150.0000 mg | ORAL_TABLET | Freq: Once | ORAL | 0 refills | Status: AC

## 2020-08-13 MED ORDER — PREDNISONE 5 MG PO TABS: 5.0000 mg | ORAL_TABLET | Freq: Two times a day (BID) | ORAL | 0 refills | Status: AC

## 2020-08-13 MED ORDER — BENZONATATE 100 MG PO CAPS: 100.0000 mg | ORAL_CAPSULE | Freq: Two times a day (BID) | ORAL | 0 refills | Status: DC | PRN

## 2020-08-13 MED ORDER — PENICILLIN V POTASSIUM 500 MG PO TABS: 500.0000 mg | ORAL_TABLET | Freq: Three times a day (TID) | ORAL | 0 refills | Status: DC

## 2020-08-13 NOTE — Patient Instructions (Addendum)
F/U in 6 months, call if you ned me sooner  You are treated for acute  Frontal sinusitis and acute bronchitis today Four prescriptions including an antibiotic , penicillin are prescribed  Work excuse for today to return tomorrow  Non fasting chem 7 and EGFR this week due to low potassium when last checked, very important you have thisdone  It is important that you exercise regularly at least 30 minutes 5 times a week. If you develop chest pain, have severe difficulty breathing, or feel very tired, stop exercising immediately and seek medical attention  Think about what you will eat, plan ahead. Choose " clean, green, fresh or frozen" over canned, processed or packaged foods which are more sugary, salty and fatty. 70 to 75% of food eaten should be vegetables and fruit. Three meals at set times with snacks allowed between meals, but they must be fruit or vegetables. Aim to eat over a 12 hour period , example 7 am to 7 pm, and STOP after  your last meal of the day. Drink water,generally about 64 ounces per day, no other drink is as healthy. Fruit juice is best enjoyed in a healthy way, by EATING the fruit.  Thanks for choosing [facility], we consider it a privelige to serve you.  

## 2020-08-18 ENCOUNTER — Encounter: Payer: Self-pay | Admitting: Family Medicine

## 2020-08-18 NOTE — Assessment & Plan Note (Signed)
Penicillin prescribed and work excuse x 1 day

## 2020-08-18 NOTE — Assessment & Plan Note (Signed)
DASH diet and commitment to daily physical activity for a minimum of 30 minutes discussed and encouraged, as a part of hypertension management. The importance of attaining a healthy weight is also discussed.  BP/Weight 08/13/2020 03/07/2020 12/27/2019 12/07/2019 08/04/2019 05/30/2019 48/27/0786  Systolic BP - 754 492 010 071 219 758  Diastolic BP - 86 86 75 78 70 77  Wt. (Lbs) 174 174 174.08 170 176.4 171 174.5  BMI 28.08 28.08 27.26 26.63 28.05 27.6 28.17

## 2020-08-18 NOTE — Assessment & Plan Note (Signed)
Recurrent/  Chronic problem , updated lab past due, risk of death with un corrected low potassium is again discussed

## 2020-08-18 NOTE — Progress Notes (Addendum)
Virtual Visit via video  I connected with [name] on 08/18/20 at  9:00 AM EST by video I discussed the limitations of evaluation and management  of telemedicine and the patient agreed o proceed  Location: Patient: home Provider: office    History of Present Illness: 3 day h/o frontal pressure with yellow green nasal drainage , cough productive of yellow sputum and chills No documented fever, no ear pain or sore throat Feels weak   Observations/Objective: Ht 5\' 6"  (1.676 m)   Wt 174 lb (78.9 kg)   BMI 28.08 kg/m  Good communication with no confusion and intact memory. Alert and oriented x 3, Ill appearing, nasal congestion evident in speech intermittent "wet" cough during visit    Assessment and Plan: Acute frontal sinusitis Penicillin prescribed and work excuse x 1 day  Acute bronchitis PCN and decongestant prescribed  HYPOKALEMIA Recurrent/  Chronic problem , updated lab past due, risk of death with un corrected low potassium is again discussed  Essential hypertension DASH diet and commitment to daily physical activity for a minimum of 30 minutes discussed and encouraged, as a part of hypertension management. The importance of attaining a healthy weight is also discussed.  BP/Weight 08/13/2020 03/07/2020 12/27/2019 12/07/2019 08/04/2019 05/30/2019 62/83/6629  Systolic BP - 476 546 503 546 568 127  Diastolic BP - 86 86 75 78 70 77  Wt. (Lbs) 174 174 174.08 170 176.4 171 174.5  BMI 28.08 28.08 27.26 26.63 28.05 27.6 28.17         Follow Up Instructions:    I discussed the assessment and treatment plan with the patient. The patient was provided an opportunity to ask questions and all were answered. The patient agreed with the plan and demonstrated an understanding of the instructions.   The patient was advised to call back or seek an in-person evaluation if the symptoms worsen or if the condition fails to improve as anticipated.  I provided 11 minutes of  face-to-face time during this encounter.   Tula Nakayama, MD

## 2020-08-18 NOTE — Assessment & Plan Note (Signed)
PCN and decongestant prescribed

## 2020-08-21 ENCOUNTER — Other Ambulatory Visit: Payer: Self-pay | Admitting: Family Medicine

## 2020-09-21 ENCOUNTER — Other Ambulatory Visit: Payer: Self-pay | Admitting: Family Medicine

## 2020-10-22 ENCOUNTER — Telehealth: Payer: Self-pay

## 2020-10-22 ENCOUNTER — Telehealth: Payer: Self-pay | Admitting: Adult Health

## 2020-10-22 MED ORDER — SULFAMETHOXAZOLE-TRIMETHOPRIM 800-160 MG PO TABS: 1.0000 | ORAL_TABLET | Freq: Two times a day (BID) | ORAL | 0 refills | Status: DC

## 2020-10-22 MED ORDER — FLUCONAZOLE 150 MG PO TABS: ORAL_TABLET | ORAL | 1 refills | Status: DC

## 2020-10-22 NOTE — Telephone Encounter (Signed)
Patient wants [name] to call her. Patient thinks she may have a UTI or some type of infection that is causing her back to hurt. Our nurse schedule didn't work with her Lunch schedule for today or tomorrow. Patient wants a call from [place]. Clinical staff will follow up with the patient.

## 2020-10-22 NOTE — Telephone Encounter (Signed)
Pt called stating that she received Rx for kidney infection and and states that she will need a work note.

## 2020-10-22 NOTE — Telephone Encounter (Signed)
Has low back for 1 week and urinary frequency has started cranberry juice and may be better will rx septra ds and she requests diflucan too, if not better soon, make appt

## 2020-10-22 NOTE — Telephone Encounter (Signed)
Pt stated that she needed a work note to leave work early today, at [employer] and be out tomorrow, Tuesday 10/23/20, due to UTI symptoms. Discussed with Derrek Monaco, NP who agreed to write a note for Tuesday for patient to get antibiotic in system and push fluids in order to improve symptoms. Work note sent to pt via [place].

## 2020-10-26 ENCOUNTER — Other Ambulatory Visit: Payer: Self-pay | Admitting: Family Medicine

## 2020-11-21 ENCOUNTER — Encounter: Payer: Self-pay | Admitting: Adult Health

## 2020-11-21 ENCOUNTER — Other Ambulatory Visit: Payer: Self-pay

## 2020-11-21 ENCOUNTER — Ambulatory Visit (INDEPENDENT_AMBULATORY_CARE_PROVIDER_SITE_OTHER): Payer: Commercial Managed Care - PPO | Admitting: Adult Health

## 2020-11-21 ENCOUNTER — Other Ambulatory Visit (HOSPITAL_COMMUNITY)
Admission: RE | Admit: 2020-11-21 | Discharge: 2020-11-21 | Disposition: A | Discharge status: Home / Self Care | Payer: Commercial Managed Care - PPO | Source: Ambulatory Visit | Attending: Adult Health | Admitting: Adult Health

## 2020-11-21 VITALS — BP 126/80 | HR 61 | Ht 66.5 in | Wt 168.5 lb

## 2020-11-21 DIAGNOSIS — Z01419 Encounter for gynecological examination (general) (routine) without abnormal findings: Secondary | ICD-10-CM | POA: Insufficient documentation

## 2020-11-21 DIAGNOSIS — N946 Dysmenorrhea, unspecified: Secondary | ICD-10-CM

## 2020-11-21 DIAGNOSIS — Z1211 Encounter for screening for malignant neoplasm of colon: Secondary | ICD-10-CM | POA: Diagnosis not present

## 2020-11-21 DIAGNOSIS — M549 Dorsalgia, unspecified: Secondary | ICD-10-CM

## 2020-11-21 LAB — HEMOCCULT GUIAC POC 1CARD (OFFICE): Fecal Occult Blood, POC: NEGATIVE

## 2020-11-21 NOTE — Progress Notes (Signed)
Patient ID: [name], female   DOB: Oct 21, 1970, 50 y.o.   MRN: [id number] History of Present Illness:  [name] is a 50 year old black female, separated, [id number], in for a well woman gyn exam and pap. She is having pain in back radiates to right leg, when on her period. She is ap ablation but still bleeds. She is working at [employer] PCP is Dr Moshe Cipro.  Current Medications, Allergies, Past Medical History, Past Surgical History, Family History and Social History were reviewed in [employer] record.     Review of Systems: Patient denies any headaches, hearing loss, fatigue, blurred vision, shortness of breath, chest pain, abdominal pain, problems with bowel movements, urination, or intercourse(not having sex). No joint pain or mood swings. See HPI for positives.  Physical Exam:BP 126/80 (BP Location: Left Arm, Patient Position: Sitting, Cuff Size: Normal)   Pulse 61   Ht 5' 6.5" (1.689 m)   Wt 168 lb 8 oz (76.4 kg)   LMP 10/24/2020 (Approximate)   BMI 26.79 kg/m  General:  Well developed, well nourished, no acute distress Skin:  Warm and dry Neck:  Midline trachea, normal thyroid, good ROM, no lymphadenopathy Lungs; Clear to auscultation bilaterally Breast:  No dominant palpable mass, retraction, or nipple discharge Cardiovascular: Regular rate and rhythm Abdomen:  Soft, non tender, no hepatosplenomegaly Pelvic:  External genitalia is normal in appearance, no lesions.  The vagina is normal in appearance. Urethra has no lesions or masses. The cervix is bulbous,nabothian cyst at 5 0' clock and +period blood, pap with GC/CHL and HR HPV genotyping performed.  Uterus is felt to be normal size, shape, and contour.  No adnexal masses or tenderness noted.Bladder is non tender, no masses felt. Rectal: Good sphincter tone, no polyps, or hemorrhoids felt.  Hemoccult negative. Extremities/musculoskeletal:  No swelling or varicosities noted, no clubbing or cyanosis Psych:  No mood  changes, alert and cooperative,seems happy AA is 0 Fall risk is moderate  PHQ 9 score is 0 GAD 7 score is 0  [facility] - 11/21/20 1203      Pregnancy Intention Screening   Does the patient want to become pregnant in the next year? N/A    Does the patient's partner want to become pregnant in the next year? N/A    Would the patient like to discuss contraceptive options today? N/A      Contraception Wrap Up   Current Method Female Sterilization    End Method Female Sterilization    Contraception Counseling Provided No         Examination chaperoned by Levy Pupa LPN   Impression and Plan: 1. Encounter for gynecological examination with Papanicolaou smear of cervix Pap sent Physical in 1 year Pap in 3 years if normal Labs with PCP Mammogram yearly Colonoscopy per GI  2. Encounter for screening fecal occult blood testing   3. Back pain with radiation   4. Dysmenorrhea Will follow for now

## 2020-11-23 LAB — CYTOLOGY - PAP
Chlamydia: NEGATIVE
Comment: NEGATIVE
Comment: NEGATIVE
Comment: NORMAL
Diagnosis: NEGATIVE
High risk HPV: NEGATIVE
Neisseria Gonorrhea: NEGATIVE

## 2020-12-02 ENCOUNTER — Other Ambulatory Visit: Payer: Self-pay | Admitting: Family Medicine

## 2021-01-30 ENCOUNTER — Other Ambulatory Visit: Payer: Self-pay | Admitting: Family Medicine

## 2021-02-11 ENCOUNTER — Ambulatory Visit: Payer: Commercial Managed Care - PPO | Admitting: Family Medicine

## 2021-02-20 ENCOUNTER — Ambulatory Visit: Payer: Commercial Managed Care - PPO | Admitting: Family Medicine

## 2021-03-11 ENCOUNTER — Other Ambulatory Visit: Payer: Self-pay | Admitting: Family Medicine

## 2021-03-12 ENCOUNTER — Telehealth: Payer: Self-pay

## 2021-03-12 ENCOUNTER — Other Ambulatory Visit: Payer: Self-pay

## 2021-03-12 MED ORDER — POTASSIUM CHLORIDE ER 10 MEQ PO TBCR: EXTENDED_RELEASE_TABLET | ORAL | 0 refills | Status: DC

## 2021-03-12 NOTE — Telephone Encounter (Signed)
error 

## 2021-03-12 NOTE — Telephone Encounter (Signed)
1 refill only sent

## 2021-03-12 NOTE — Telephone Encounter (Signed)
Patient coming in on 03/12/21 for blood work she is needing a refill on potassium her next apt with Dr. Moshe Cipro is 04/12/21 I offered sooner appointment but patent declined due to work schedule ph# [phone]

## 2021-03-26 ENCOUNTER — Other Ambulatory Visit: Payer: Self-pay | Admitting: Family Medicine

## 2021-04-12 ENCOUNTER — Other Ambulatory Visit: Payer: Self-pay

## 2021-04-12 ENCOUNTER — Encounter: Payer: Self-pay | Admitting: Family Medicine

## 2021-04-12 ENCOUNTER — Ambulatory Visit: Payer: BC Managed Care – PPO | Admitting: Family Medicine

## 2021-04-12 VITALS — BP 130/82 | HR 63 | Ht 66.0 in | Wt 174.1 lb

## 2021-04-12 DIAGNOSIS — I10 Essential (primary) hypertension: Secondary | ICD-10-CM

## 2021-04-12 DIAGNOSIS — N3000 Acute cystitis without hematuria: Secondary | ICD-10-CM | POA: Diagnosis not present

## 2021-04-12 DIAGNOSIS — M549 Dorsalgia, unspecified: Secondary | ICD-10-CM

## 2021-04-12 DIAGNOSIS — R7303 Prediabetes: Secondary | ICD-10-CM

## 2021-04-12 DIAGNOSIS — L84 Corns and callosities: Secondary | ICD-10-CM

## 2021-04-12 DIAGNOSIS — E876 Hypokalemia: Secondary | ICD-10-CM

## 2021-04-12 DIAGNOSIS — Z1231 Encounter for screening mammogram for malignant neoplasm of breast: Secondary | ICD-10-CM

## 2021-04-12 DIAGNOSIS — E663 Overweight: Secondary | ICD-10-CM

## 2021-04-12 DIAGNOSIS — Z1211 Encounter for screening for malignant neoplasm of colon: Secondary | ICD-10-CM

## 2021-04-12 DIAGNOSIS — E559 Vitamin D deficiency, unspecified: Secondary | ICD-10-CM

## 2021-04-12 DIAGNOSIS — R350 Frequency of micturition: Secondary | ICD-10-CM | POA: Insufficient documentation

## 2021-04-12 DIAGNOSIS — J309 Allergic rhinitis, unspecified: Secondary | ICD-10-CM

## 2021-04-12 LAB — POCT URINALYSIS DIP (CLINITEK)
Bilirubin, UA: NEGATIVE
Blood, UA: NEGATIVE
Glucose, UA: NEGATIVE mg/dL
Ketones, POC UA: NEGATIVE mg/dL
Nitrite, UA: NEGATIVE
POC PROTEIN,UA: NEGATIVE
Spec Grav, UA: 1.025 (ref 1.010–1.025)
Urobilinogen, UA: 0.2 E.U./dL
pH, UA: 7 (ref 5.0–8.0)

## 2021-04-12 MED ORDER — DILTIAZEM HCL ER COATED BEADS 120 MG PO CP24: 120.0000 mg | ORAL_CAPSULE | Freq: Every day | ORAL | 3 refills | Status: DC

## 2021-04-12 MED ORDER — HYDROCHLOROTHIAZIDE 25 MG PO TABS: 25.0000 mg | ORAL_TABLET | Freq: Every day | ORAL | 3 refills | Status: DC

## 2021-04-12 MED ORDER — METOPROLOL TARTRATE 25 MG PO TABS: ORAL_TABLET | ORAL | 3 refills | Status: DC

## 2021-04-12 MED ORDER — POTASSIUM CHLORIDE ER 10 MEQ PO TBCR: EXTENDED_RELEASE_TABLET | ORAL | 11 refills | Status: DC

## 2021-04-12 NOTE — Assessment & Plan Note (Signed)
2 week h/o increased frequency, denies dysuria, nausea, flank pain or chills. Check CCUA treat if positive c/s

## 2021-04-12 NOTE — Assessment & Plan Note (Addendum)
bilateral  Callus on soles , painful at times, refer podiatry

## 2021-04-12 NOTE — Patient Instructions (Addendum)
Annual exam in 5 to  6 months, call if you need me before  Please schedule mammogram at checkout  Fasting labs next week, lipid, cmp and HBA1C, CBC, TSH and vit D  CCUA today in office  You are referred for colonoscopy and to Podiatry  It is important that you exercise regularly at least 30 minutes 5 times a week. If you develop chest pain, have severe difficulty breathing, or feel very tired, stop exercising immediately and seek medical attention   Think about what you will eat, plan ahead. Choose " clean, green, fresh or frozen" over canned, processed or packaged foods which are more sugary, salty and fatty. 70 to 75% of food eaten should be vegetables and fruit. Three meals at set times with snacks allowed between meals, but they must be fruit or vegetables. Aim to eat over a 12 hour period , example 7 am to 7 pm, and STOP after  your last meal of the day. Drink water,generally about 64 ounces per day, no other drink is as healthy. Fruit juice is best enjoyed in a healthy way, by EATING the fruit.  Thanks for choosing [facility], we consider it a privelige to serve you.

## 2021-04-12 NOTE — Assessment & Plan Note (Signed)
Updated lab needed at/ before next visit.   

## 2021-04-12 NOTE — Progress Notes (Signed)
[name]     MRN: [id number]      DOB: January 11, 1971   HPI [name] is here for follow up and re-evaluation of chronic medical conditions, medication management and review of any available recent lab and radiology data.  Preventive health is updated, specifically  Cancer screening and Immunization.  Delaying immunization, agreeable to colonoscopy  The PT denies any adverse reactions to current medications since the last visit.  States not taking med as prescribed and repeatedly has low potassium  C/o callus on soles of feet , wants referral for thois  ROS Denies recent fever or chills. Denies sinus pressure, nasal congestion, ear pain or sore throat. Denies chest congestion, productive cough or wheezing. Denies chest pains, palpitations and leg swelling Denies abdominal pain, nausea, vomiting,diarrhea or constipation.   Denies dysuria, frequency, hesitancy or incontinence. Denies joint pain, swelling and limitation in mobility. Denies headaches, seizures, numbness, or tingling. Denies depression, anxiety or insomnia.    PE  BP 130/82   Pulse 63   Ht 5\' 6"  (1.676 m)   Wt 174 lb 1.9 oz (79 kg)   SpO2 97%   BMI 28.10 kg/m   Patient alert and oriented and in no cardiopulmonary distress.  HEENT: No facial asymmetry, EOMI,     Neck supple .  Chest: Clear to auscultation bilaterally.  CVS: S1, S2 no murmurs, no S3.Regular rate.  Abdomen: soft, non tender,no renal angle tenderness   Ext: No edema  MS: Adequate ROM spine, shoulders, hips and knees.  Skin: Intact, no ulcerations or rash noted.calluses on soles  Psych: Good eye contact, normal affect. Memory intact not anxious or depressed appearing.  CNS: CN 2-12 intact, power,  normal throughout.no focal deficits noted.   Assessment & Plan  Callus of foot bilateral  Callus on soles , painful at times, refer podiatry  Essential hypertension Controlled, no change in medication DASH diet and commitment to daily  physical activity for a minimum of 30 minutes discussed and encouraged, as a part of hypertension management. The importance of attaining a healthy weight is also discussed.  BP/Weight 04/12/2021 11/21/2020 08/13/2020 03/07/2020 12/27/2019 12/07/2019 9/79/8921  Systolic BP 194 174 - 081 448 185 631  Diastolic BP 82 80 - 86 86 75 78  Wt. (Lbs) 174.12 168.5 174 174 174.08 170 176.4  BMI 28.1 26.79 28.08 28.08 27.26 26.63 28.05       Overweight (BMI 25.0-29.9)  Patient re-educated about  the importance of commitment to a  minimum of 150 minutes of exercise per week as able.  The importance of healthy food choices with portion control discussed, as well as eating regularly and within a 12 hour window most days. The need to choose "clean , green" food 50 to 75% of the time is discussed, as well as to make water the primary drink and set a goal of 64 ounces water daily.    Weight /BMI 04/12/2021 11/21/2020 08/13/2020  WEIGHT 174 lb 1.9 oz 168 lb 8 oz 174 lb  HEIGHT 5\' 6"  5' 6.5" 5\' 6"   BMI 28.1 kg/m2 26.79 kg/m2 28.08 kg/m2      Prediabetes Patient educated about the importance of limiting  Carbohydrate intake , the need to commit to daily physical activity for a minimum of 30 minutes , and to commit weight loss. The fact that changes in all these areas will reduce or eliminate all together the development of diabetes is stressed.   Diabetic Labs Latest Ref Rng & Units 12/27/2019 09/02/2018  07/09/2018 06/25/2018 05/21/2016  HbA1c <5.7 % - - - - -  Chol <200 mg/dL 153 - - 134 -  HDL > OR = 50 mg/dL 37(L) - - 36(L) -  Calc LDL mg/dL (calc) 95 - - 82 -  Triglycerides <150 mg/dL 110 - - 78 -  Creatinine 0.50 - 1.10 mg/dL 0.91 0.89 0.90 0.96 1.06   BP/Weight 04/12/2021 11/21/2020 08/13/2020 03/07/2020 12/27/2019 12/07/2019 4/44/6190  Systolic BP 122 241 - 146 431 427 670  Diastolic BP 82 80 - 86 86 75 78  Wt. (Lbs) 174.12 168.5 174 174 174.08 170 176.4  BMI 28.1 26.79 28.08 28.08 27.26 26.63 28.05   No  flowsheet data found.  Updated lab needed at/ before next visit.   Vitamin D deficiency Updated lab needed at/ before next visit.   HYPOKALEMIA Updated lab needed at/ before next visit.   Allergic rhinitis No current flare  Back pain with radiation No current or recent flare  Frequent urination 2 week h/o increased frequency, denies dysuria, nausea, flank pain or chills. Check CCUA treat if positive c/s

## 2021-04-12 NOTE — Assessment & Plan Note (Signed)
Patient educated about the importance of limiting  Carbohydrate intake , the need to commit to daily physical activity for a minimum of 30 minutes , and to commit weight loss. The fact that changes in all these areas will reduce or eliminate all together the development of diabetes is stressed.   Diabetic Labs Latest Ref Rng & Units 12/27/2019 09/02/2018 07/09/2018 06/25/2018 05/21/2016  HbA1c <5.7 % - - - - -  Chol <200 mg/dL 153 - - 134 -  HDL > OR = 50 mg/dL 37(L) - - 36(L) -  Calc LDL mg/dL (calc) 95 - - 82 -  Triglycerides <150 mg/dL 110 - - 78 -  Creatinine 0.50 - 1.10 mg/dL 0.91 0.89 0.90 0.96 1.06   BP/Weight 04/12/2021 11/21/2020 08/13/2020 03/07/2020 12/27/2019 12/07/2019 6/43/1427  Systolic BP 670 110 - 034 961 164 353  Diastolic BP 82 80 - 86 86 75 78  Wt. (Lbs) 174.12 168.5 174 174 174.08 170 176.4  BMI 28.1 26.79 28.08 28.08 27.26 26.63 28.05   No flowsheet data found.  Updated lab needed at/ before next visit.

## 2021-04-12 NOTE — Assessment & Plan Note (Signed)
No current or recent flare

## 2021-04-12 NOTE — Assessment & Plan Note (Signed)
  Patient re-educated about  the importance of commitment to a  minimum of 150 minutes of exercise per week as able.  The importance of healthy food choices with portion control discussed, as well as eating regularly and within a 12 hour window most days. The need to choose "clean , green" food 50 to 75% of the time is discussed, as well as to make water the primary drink and set a goal of 64 ounces water daily.    Weight /BMI 04/12/2021 11/21/2020 08/13/2020  WEIGHT 174 lb 1.9 oz 168 lb 8 oz 174 lb  HEIGHT 5\' 6"  5' 6.5" 5\' 6"   BMI 28.1 kg/m2 26.79 kg/m2 28.08 kg/m2

## 2021-04-12 NOTE — Assessment & Plan Note (Signed)
Controlled, no change in medication DASH diet and commitment to daily physical activity for a minimum of 30 minutes discussed and encouraged, as a part of hypertension management. The importance of attaining a healthy weight is also discussed.  BP/Weight 04/12/2021 11/21/2020 08/13/2020 03/07/2020 12/27/2019 12/07/2019 3/89/3734  Systolic BP 287 681 - 157 262 035 597  Diastolic BP 82 80 - 86 86 75 78  Wt. (Lbs) 174.12 168.5 174 174 174.08 170 176.4  BMI 28.1 26.79 28.08 28.08 27.26 26.63 28.05

## 2021-04-12 NOTE — Assessment & Plan Note (Signed)
No current flare 

## 2021-04-13 ENCOUNTER — Other Ambulatory Visit: Payer: Self-pay | Admitting: Family Medicine

## 2021-04-16 ENCOUNTER — Encounter: Payer: Self-pay | Admitting: Internal Medicine

## 2021-04-16 LAB — URINE CULTURE

## 2021-04-19 ENCOUNTER — Encounter: Payer: Self-pay | Admitting: Family Medicine

## 2021-04-26 ENCOUNTER — Ambulatory Visit: Payer: BC Managed Care – PPO | Admitting: Podiatry

## 2021-05-03 ENCOUNTER — Other Ambulatory Visit: Payer: Self-pay | Admitting: Family Medicine

## 2021-05-07 ENCOUNTER — Ambulatory Visit (INDEPENDENT_AMBULATORY_CARE_PROVIDER_SITE_OTHER): Payer: BC Managed Care – PPO | Admitting: *Deleted

## 2021-05-07 ENCOUNTER — Encounter: Payer: Self-pay | Admitting: Internal Medicine

## 2021-05-07 ENCOUNTER — Ambulatory Visit (INDEPENDENT_AMBULATORY_CARE_PROVIDER_SITE_OTHER): Payer: BC Managed Care – PPO | Admitting: Internal Medicine

## 2021-05-07 ENCOUNTER — Other Ambulatory Visit: Payer: Self-pay

## 2021-05-07 DIAGNOSIS — J069 Acute upper respiratory infection, unspecified: Secondary | ICD-10-CM

## 2021-05-07 DIAGNOSIS — J309 Allergic rhinitis, unspecified: Secondary | ICD-10-CM | POA: Diagnosis not present

## 2021-05-07 LAB — POCT INFLUENZA A/B
Influenza A, POC: NEGATIVE
Influenza B, POC: NEGATIVE

## 2021-05-07 MED ORDER — FLUTICASONE PROPIONATE 50 MCG/ACT NA SUSP: 2.0000 | Freq: Every day | NASAL | 6 refills | Status: DC

## 2021-05-07 MED ORDER — CETIRIZINE HCL 1 MG/ML PO SOLN: ORAL | 3 refills | Status: DC

## 2021-05-07 NOTE — Patient Instructions (Signed)
Please use Flonase for nasal congestion.  Please take Zyrtec for allergies.  Please come to the office for rapid flu test.

## 2021-05-07 NOTE — Progress Notes (Signed)
Virtual Visit via Telephone Note   This visit type was conducted due to national recommendations for restrictions regarding the COVID-19 Pandemic (e.g. social distancing) in an effort to limit this patient's exposure and mitigate transmission in our community.  Due to her co-morbid illnesses, this patient is at least at moderate risk for complications without adequate follow up.  This format is felt to be most appropriate for this patient at this time.  The patient did not have access to video technology/had technical difficulties with video requiring transitioning to audio format only (telephone).  All issues noted in this document were discussed and addressed.  No physical exam could be performed with this format.  Evaluation Performed:  Follow-up visit  Date:  05/07/2021   ID:  [name], DOB 02/29/1971, MRN [id number]  Patient Location: Home Provider Location: Office/Clinic  Participants: Patient Location of Patient: Home Location of Provider: Telehealth Consent was obtain for visit to be over via telehealth. I verified that I am speaking with the correct person using two identifiers.  PCP:  Fayrene Helper, MD   Chief Complaint: Nasal congestion, ear stuffiness and headache  History of Present Illness:    [name] is a 50 y.o. female who has a televisit for complaint of nasal congestion, ear stuffiness and headache since yesterday.  She denies any cough, fever, chills.  Her daughter had similar symptoms yesterday after she went for trick or treat while it was raining.  She denies any dyspnea or wheezing.  The patient does not have symptoms concerning for COVID-19 infection (fever, chills, cough, or new shortness of breath).   Past Medical, Surgical, Social History, Allergies, and Medications have been Reviewed.  Past Medical History:  Diagnosis Date   Back pain    BV (bacterial vaginosis) 09/16/2013   Chest pain 2000   right    Contraceptive management 09/16/2013    Encephalocele ([employer]) 1994   Enlarged thyroid    HSV-2 (herpes simplex virus 2) infection    Hypertension    Irregular intermenstrual bleeding 04/27/2015   Obesity    Preterm labor    Sleep disorder    Thyroid nodule    Urinary frequency 12/14/2014   Vaginal discharge 09/16/2013   Past Surgical History:  Procedure Laterality Date   BREAST BIOPSY  2007   btl  [place] N/A 05/06/2016   Procedure: DILATATION & CURETTAGE/HYSTEROSCOPY WITH NOVASURE ABLATION;  Surgeon: Jonnie Kind, MD;  Location: [facility];  Service: Gynecology;  Laterality: N/A;  length-5; width-2.6; power-72 ; time- 1 minute 42 seconds   LAPAROSCOPIC BILATERAL SALPINGECTOMY Bilateral 05/06/2016   Procedure: LAPAROSCOPIC BILATERAL SALPINGECTOMY;  Surgeon: Jonnie Kind, MD;  Location: [facility];  Service: Gynecology;  Laterality: Bilateral;   RETAINED PLACENTA REMOVAL     TUBAL LIGATION     04/2016   tubal ligation reversal  2001   WISDOM TOOTH EXTRACTION       Current Meds  Medication Sig   cetirizine HCl (ZYRTEC) 1 MG/ML solution TAKE 1/2 TO 1 TEASPOON BY MOUTH DAILY AS , NEEDED, FOR ALLERGIES (OTC)   diltiazem (CARDIZEM CD) 120 MG 24 hr capsule Take 1 capsule (120 mg total) by mouth daily.   hydrochlorothiazide (HYDRODIURIL) 25 MG tablet Take 1 tablet (25 mg total) by mouth daily.   metoprolol tartrate (LOPRESSOR) 25 MG tablet TAKE 1/2 TABLET BY MOUTH TWICE DAILY.   potassium chloride (KLOR-CON 10) 10 MEQ tablet Take four tablets by  mouth two times daily     Allergies:   Biaxin [clarithromycin], Iodinated diagnostic agents, Shellfish allergy, Yellow jacket venom [bee venom], and Peanut-containing drug products   ROS:   Please see the history of present illness.     All other systems reviewed and are negative.   Labs/Other Tests and Data Reviewed:    Recent Labs: No results found for requested labs within last 8760 hours.   Recent Lipid Panel Lab  Results  Component Value Date/Time   CHOL 153 12/27/2019 07:37 AM   TRIG 110 12/27/2019 07:37 AM   HDL 37 (L) 12/27/2019 07:37 AM   CHOLHDL 4.1 12/27/2019 07:37 AM   LDLCALC 95 12/27/2019 07:37 AM    Wt Readings from Last 3 Encounters:  04/12/21 174 lb 1.9 oz (79 kg)  11/21/20 168 lb 8 oz (76.4 kg)  08/13/20 174 lb (78.9 kg)     ASSESSMENT & PLAN:    URTI Allergic rhinitis We will check for rapid flu test -she has not had flu vaccine Zyrtec as needed, prefers liquid form Flonase for nasal congestion Tylenol as needed for headache/myalgias Work note provided for today  Time:   Today, I have spent 9 minutes reviewing the chart, including problem list, medications, and with the patient with telehealth technology discussing the above problems.   Medication Adjustments/Labs and Tests Ordered: Current medicines are reviewed at length with the patient today.  Concerns regarding medicines are outlined above.   Tests Ordered: No orders of the defined types were placed in this encounter.   Medication Changes: No orders of the defined types were placed in this encounter.    Note: This dictation was prepared with Dragon dictation along with smaller phrase technology. Similar sounding words can be transcribed inadequately or may not be corrected upon review. Any transcriptional errors that result from this process are unintentional.      Disposition:  Follow up  Signed, Lindell Spar, MD  05/07/2021 2:06 PM     [facility]

## 2021-05-22 ENCOUNTER — Ambulatory Visit: Payer: BC Managed Care – PPO

## 2021-05-22 ENCOUNTER — Encounter: Payer: Self-pay | Admitting: Internal Medicine

## 2021-06-04 DIAGNOSIS — L110 Acquired keratosis follicularis: Secondary | ICD-10-CM | POA: Diagnosis not present

## 2021-06-04 DIAGNOSIS — M79671 Pain in right foot: Secondary | ICD-10-CM | POA: Diagnosis not present

## 2021-06-04 DIAGNOSIS — M79672 Pain in left foot: Secondary | ICD-10-CM | POA: Diagnosis not present

## 2021-06-04 DIAGNOSIS — Q828 Other specified congenital malformations of skin: Secondary | ICD-10-CM | POA: Diagnosis not present

## 2021-06-28 ENCOUNTER — Ambulatory Visit: Payer: BC Managed Care – PPO | Admitting: Family Medicine

## 2021-07-03 ENCOUNTER — Ambulatory Visit (HOSPITAL_COMMUNITY)
Admission: RE | Admit: 2021-07-03 | Discharge: 2021-07-03 | Disposition: A | Discharge status: Home / Self Care | Payer: BC Managed Care – PPO | Source: Ambulatory Visit | Attending: Family Medicine | Admitting: Family Medicine

## 2021-07-03 ENCOUNTER — Other Ambulatory Visit: Payer: Self-pay

## 2021-07-03 DIAGNOSIS — Z1231 Encounter for screening mammogram for malignant neoplasm of breast: Secondary | ICD-10-CM | POA: Insufficient documentation

## 2021-07-11 ENCOUNTER — Other Ambulatory Visit: Payer: Self-pay

## 2021-07-11 ENCOUNTER — Ambulatory Visit (INDEPENDENT_AMBULATORY_CARE_PROVIDER_SITE_OTHER): Payer: Self-pay | Admitting: *Deleted

## 2021-07-11 ENCOUNTER — Encounter: Payer: Self-pay | Admitting: *Deleted

## 2021-07-11 VITALS — Ht 66.0 in | Wt 174.8 lb

## 2021-07-11 DIAGNOSIS — Z1211 Encounter for screening for malignant neoplasm of colon: Secondary | ICD-10-CM

## 2021-07-11 MED ORDER — PEG 3350-KCL-NA BICARB-NACL 420 G PO SOLR: 4000.0000 mL | Freq: Once | ORAL | 0 refills | Status: AC

## 2021-07-11 NOTE — Progress Notes (Addendum)
Gastroenterology Pre-Procedure Review  Request Date: 07/11/2021 Requesting Physician: Dr. Moshe Cipro, no previous TCS  PATIENT REVIEW QUESTIONS: The patient responded to the following health history questions as indicated:    1. Diabetes Melitis: no 2. Joint replacements in the past 12 months: no 3. Major health problems in the past 3 months: no 4. Has an artificial valve or MVP: no 5. Has a defibrillator: no 6. Has been advised in past to take antibiotics in advance of a procedure like teeth cleaning: no 7. Family history of colon cancer: no  8. Alcohol Use: no 9. Illicit drug Use: no 10. History of sleep apnea: no  11. History of coronary artery or other vascular stents placed within the last 12 months: no 12. History of any prior anesthesia complications: no 13. Body mass index is 28.21 kg/m.    MEDICATIONS & ALLERGIES:    Patient reports the following regarding taking any blood thinners:   Plavix? no Aspirin? no Coumadin? no Brilinta? no Xarelto? no Eliquis? no Pradaxa? no Savaysa? no Effient? no  Patient confirms/reports the following medications:  Current Outpatient Medications  Medication Sig Dispense Refill   cetirizine HCl (ZYRTEC) 1 MG/ML solution TAKE 1/2 TO 1 TEASPOON BY MOUTH DAILY AS , NEEDED, FOR ALLERGIES (OTC) 120 mL 3   diltiazem (CARDIZEM CD) 120 MG 24 hr capsule Take 1 capsule (120 mg total) by mouth daily. 90 capsule 3   hydrochlorothiazide (HYDRODIURIL) 25 MG tablet Take 1 tablet (25 mg total) by mouth daily. 90 tablet 3   metoprolol tartrate (LOPRESSOR) 25 MG tablet TAKE 1/2 TABLET BY MOUTH TWICE DAILY. 90 tablet 0   potassium chloride (KLOR-CON 10) 10 MEQ tablet Take four tablets by mouth two times daily 240 tablet 11   No current facility-administered medications for this visit.    Patient confirms/reports the following allergies:  Allergies  Allergen Reactions   Biaxin [Clarithromycin] Diarrhea, Nausea And Vomiting and Other (See Comments)     Blood in stool   Iodinated Contrast Media Shortness Of Breath    Pt states she had difficulty breathing after receiving iv contrast in 2013.    Shellfish Allergy    Yellow Jacket Venom [Bee Venom] Swelling   Peanut-Containing Drug Products Rash    No orders of the defined types were placed in this encounter.   AUTHORIZATION INFORMATION Primary Insurance: [place],  [place] #: [id number],  Group #: [id number] Pre-Cert / Auth required: No, not required per automated system Pre-Cert / Auth #: REF#: [id number]  SCHEDULE INFORMATION: Procedure has been scheduled as follows:  Date: 07/26/2021, Time: 12:15  Location: [facility] with Dr. Abbey Chatters  This Gastroenterology Pre-Precedure Review Form is being routed to the following provider(s): Neil Crouch, PA-C

## 2021-07-11 NOTE — Progress Notes (Signed)
Pt made aware that she needs labs drawn at [facility] on 07/24/2021.

## 2021-07-22 NOTE — Progress Notes (Signed)
Ok to schedule. ASA 2.  

## 2021-07-23 ENCOUNTER — Other Ambulatory Visit: Payer: Self-pay | Admitting: *Deleted

## 2021-07-23 ENCOUNTER — Telehealth: Payer: Self-pay

## 2021-07-23 DIAGNOSIS — Z1211 Encounter for screening for malignant neoplasm of colon: Secondary | ICD-10-CM

## 2021-07-23 NOTE — Telephone Encounter (Signed)
Pt called office, she wants to cancel upcoming TCS because she is unable to get off work. She will call later to reschedule.

## 2021-07-23 NOTE — Telephone Encounter (Signed)
Noted.  Removed from schedule.  Lmom of Carolyn in Endo.  Routing to [employer], PA-C as Juluis Rainier.

## 2021-07-23 NOTE — Telephone Encounter (Signed)
Noted  

## 2021-07-26 ENCOUNTER — Ambulatory Visit (HOSPITAL_COMMUNITY): Admission: RE | Admit: 2021-07-26 | Payer: BC Managed Care – PPO | Source: Home / Self Care

## 2021-07-26 ENCOUNTER — Encounter (HOSPITAL_COMMUNITY): Admission: RE | Payer: Self-pay | Source: Home / Self Care

## 2021-07-26 SURGERY — COLONOSCOPY WITH PROPOFOL
Anesthesia: Monitor Anesthesia Care

## 2021-08-05 ENCOUNTER — Other Ambulatory Visit: Payer: Self-pay | Admitting: Family Medicine

## 2021-08-25 ENCOUNTER — Other Ambulatory Visit: Payer: Self-pay | Admitting: Family Medicine

## 2021-09-10 ENCOUNTER — Encounter: Payer: Self-pay | Admitting: Family Medicine

## 2021-09-10 ENCOUNTER — Ambulatory Visit (INDEPENDENT_AMBULATORY_CARE_PROVIDER_SITE_OTHER): Payer: BC Managed Care – PPO | Admitting: Family Medicine

## 2021-09-10 ENCOUNTER — Other Ambulatory Visit: Payer: Self-pay

## 2021-09-10 VITALS — BP 142/88 | HR 66 | Ht 66.0 in | Wt 174.0 lb

## 2021-09-10 DIAGNOSIS — M549 Dorsalgia, unspecified: Secondary | ICD-10-CM | POA: Diagnosis not present

## 2021-09-10 DIAGNOSIS — M542 Cervicalgia: Secondary | ICD-10-CM

## 2021-09-10 DIAGNOSIS — E559 Vitamin D deficiency, unspecified: Secondary | ICD-10-CM | POA: Diagnosis not present

## 2021-09-10 DIAGNOSIS — M545 Low back pain, unspecified: Secondary | ICD-10-CM

## 2021-09-10 DIAGNOSIS — R7303 Prediabetes: Secondary | ICD-10-CM | POA: Diagnosis not present

## 2021-09-10 DIAGNOSIS — M546 Pain in thoracic spine: Secondary | ICD-10-CM

## 2021-09-10 DIAGNOSIS — I10 Essential (primary) hypertension: Secondary | ICD-10-CM | POA: Diagnosis not present

## 2021-09-10 DIAGNOSIS — Z0001 Encounter for general adult medical examination with abnormal findings: Secondary | ICD-10-CM

## 2021-09-10 MED ORDER — PREDNISONE 10 MG PO TABS: 10.0000 mg | ORAL_TABLET | Freq: Two times a day (BID) | ORAL | 0 refills | Status: DC

## 2021-09-10 MED ORDER — NAPROXEN 500 MG PO TABS: 500.0000 mg | ORAL_TABLET | Freq: Two times a day (BID) | ORAL | 0 refills | Status: DC

## 2021-09-10 MED ORDER — CYCLOBENZAPRINE HCL 5 MG PO TABS: 5.0000 mg | ORAL_TABLET | Freq: Every day | ORAL | 0 refills | Status: DC

## 2021-09-10 NOTE — Patient Instructions (Addendum)
F/U in 10 weeks, call if you need me sooner, re eval bP, update immunization ? ?Need colonoscopy, please reschedule ? ?Labs today ordered 04/12/2021 ? ?X ray of entire spine today please, already ordered ?ient rest ?You are referred to PT for neck, upper and lower back pain ? ?Prednisone , naproxen and flexeril are prescribed for pain OT twice weekly for 4 weeks for back and neck pain you will be referred ? ?It is important that you exercise regularly at least 30 minutes 5 times a week. If you develop chest pain, have severe difficulty breathing, or feel very tired, stop exercising immediately and seek medical attention  ?Think about what you will eat, plan ahead. ?Choose " clean, green, fresh or frozen" over canned, processed or packaged foods which are more sugary, salty and fatty. ?70 to 75% of food eaten should be vegetables and fruit. ?Three meals at set times with snacks allowed between meals, but they must be fruit or vegetables. ?Aim to eat over a 12 hour period , example 7 am to 7 pm, and STOP after  your last meal of the day. ?Drink water,generally about 64 ounces per day, no other drink is as healthy. Fruit juice is best enjoyed in a healthy way, by EATING the fruit. ?Thanks for choosing [facility], we consider it a privelige to serve you. ? ? ?

## 2021-09-10 NOTE — Progress Notes (Signed)
? ? ?  [name]     MRN: [id number]      DOB: 02/16/1971 ? ?HPI: ?Patient is in for annual physical exam. ?Increased neck, right upper extremity and low back pain x 2 weeks, aggravated by painting rated at 5 to 7 ? ?Immunization is reviewed , refuses vaccines today ?Cancer screening needs to be addressed ? ?PE: ?BP (!) 142/88   Pulse 66   Ht '5\' 6"'$  (1.676 m)   Wt 174 lb 0.6 oz (78.9 kg)   SpO2 97%   BMI 28.09 kg/m?  ? ?Pleasant  female, alert and oriented x 3, in no cardio-pulmonary distress. ?Afebrile. ?HEENT ?No facial trauma or asymetry. Sinuses non tender.  ?Extra occullar muscles intact.Marland Kitchen ?External ears normal, . ?Neck: decreased ROM with spasm, no adenopathy,JVD or thyromegaly.No bruits. ? ?Chest: ?Clear to ascultation bilaterally.No crackles or wheezes. ?Non tender to palpation ?Cardiovascular system; ?Heart sounds normal,  S1 and  S2 ,no S3.  No murmur, or thrill. ?Apical beat not displaced ?Peripheral pulses normal. ? ?Abdomen: ?Soft, non tender, ? ?Musculoskeletal exam: ?Decreased ROM of lumbar spine,  adequate in hips , shoulders and knees. ?No deformity ,swelling or crepitus noted. ?No muscle wasting or atrophy.  ? ?Neurologic: ?Cranial nerves 2 to 12 intact. ?Power, tone ,sensation and reflexes normal throughout. ?No disturbance in gait. No tremor. ? ?Skin: ?Intact, no ulceration, erythema , scaling or rash noted. ?Pigmentation normal throughout ? ?Psych; ?Normal mood and affect. Judgement and concentration normal ? ? ?Assessment & Plan:  ?Encounter for annual general medical examination with abnormal findings in adult ?Annual exam as documented. ?Counseling done  re healthy lifestyle involving commitment to 150 minutes exercise per week, heart healthy diet, and attaining healthy weight.The importance of adequate sleep also discussed. ?Regular seat belt use and home safety, is also discussed. ?Changes in health habits are decided on by the patient with goals and time frames  set for achieving  them. ?Immunization and cancer screening needs are specifically addressed at this visit. ? ? ?Essential hypertension ?Elevated at visit , forgot to take medication, states rushing/ stressed, poor sleep the night before ?DASH diet and commitment to daily physical activity for a minimum of 30 minutes discussed and encouraged, as a part of hypertension management. ?The importance of attaining a healthy weight is also discussed. ? ?BP/Weight 09/10/2021 07/11/2021 04/12/2021 11/21/2020 08/13/2020 03/07/2020 12/27/2019  ?Systolic BP 974 - 163 845 - 136 136  ?Diastolic BP 88 - 82 80 - 86 86  ?Wt. (Lbs) 174.04 174.8 174.12 168.5 174 174 174.08  ?BMI 28.09 28.21 28.1 26.79 28.08 28.08 27.26  ? ? ? ? ? ?Back pain with radiation ? Current flare , x ray lumbar and thoracic spine , PT x 4 weeks, naproxen, prednisone and flexeril short term ? ?Neck pain ?Current flare, trigger painting indoors ?Anti inflammatory, PT and x ray needed of C spine ? ?

## 2021-09-11 ENCOUNTER — Telehealth: Payer: Self-pay

## 2021-09-11 LAB — LIPID PANEL
Chol/HDL Ratio: 3.9 ratio (ref 0.0–4.4)
Cholesterol, Total: 154 mg/dL (ref 100–199)
HDL: 39 mg/dL — ABNORMAL LOW (ref >39)
LDL Chol Calc (NIH): 98 mg/dL (ref 0–99)
Triglycerides: 90 mg/dL (ref 0–149)
VLDL Cholesterol Cal: 17 mg/dL (ref 5–40)

## 2021-09-11 LAB — CMP14+EGFR
ALT: 11 IU/L (ref 0–32)
AST: 15 IU/L (ref 0–40)
Albumin/Globulin Ratio: 1.3 (ref 1.2–2.2)
Albumin: 4.3 g/dL (ref 3.8–4.8)
Alkaline Phosphatase: 112 IU/L (ref 44–121)
BUN/Creatinine Ratio: 11 (ref 9–23)
BUN: 10 mg/dL (ref 6–24)
Bilirubin Total: 0.5 mg/dL (ref 0.0–1.2)
CO2: 25 mmol/L (ref 20–29)
Calcium: 9.4 mg/dL (ref 8.7–10.2)
Chloride: 102 mmol/L (ref 96–106)
Creatinine, Ser: 0.95 mg/dL (ref 0.57–1.00)
Globulin, Total: 3.2 g/dL (ref 1.5–4.5)
Glucose: 68 mg/dL — ABNORMAL LOW (ref 70–99)
Potassium: 3.4 mmol/L — ABNORMAL LOW (ref 3.5–5.2)
Sodium: 143 mmol/L (ref 134–144)
Total Protein: 7.5 g/dL (ref 6.0–8.5)
eGFR: 73 mL/min/{1.73_m2} (ref >59)

## 2021-09-11 LAB — CBC
Hematocrit: 39.8 % (ref 34.0–46.6)
Hemoglobin: 12.7 g/dL (ref 11.1–15.9)
MCH: 22.4 pg — ABNORMAL LOW (ref 26.6–33.0)
MCHC: 31.9 g/dL (ref 31.5–35.7)
MCV: 70 fL — ABNORMAL LOW (ref 79–97)
Platelets: 350 10*3/uL (ref 150–450)
RBC: 5.66 x10E6/uL — ABNORMAL HIGH (ref 3.77–5.28)
RDW: 15.6 % — ABNORMAL HIGH (ref 11.7–15.4)
WBC: 9.1 10*3/uL (ref 3.4–10.8)

## 2021-09-11 LAB — HEMOGLOBIN A1C
Est. average glucose Bld gHb Est-mCnc: 117 mg/dL
Hgb A1c MFr Bld: 5.7 % — ABNORMAL HIGH (ref 4.8–5.6)

## 2021-09-11 LAB — VITAMIN D 25 HYDROXY (VIT D DEFICIENCY, FRACTURES): Vit D, 25-Hydroxy: 14.9 ng/mL — ABNORMAL LOW (ref 30.0–100.0)

## 2021-09-11 LAB — TSH: TSH: 1.48 u[IU]/mL (ref 0.450–4.500)

## 2021-09-11 MED ORDER — ERGOCALCIFEROL 1.25 MG (50000 UT) PO CAPS: 50000.0000 [IU] | ORAL_CAPSULE | ORAL | 2 refills | Status: DC

## 2021-09-11 NOTE — Telephone Encounter (Signed)
Patient wants to talk with nurse or MD about her abnormal lab work.   ?

## 2021-09-12 NOTE — Telephone Encounter (Signed)
Pt states verbal understanding of results ?

## 2021-09-15 ENCOUNTER — Encounter: Payer: Self-pay | Admitting: Family Medicine

## 2021-09-15 DIAGNOSIS — Z0001 Encounter for general adult medical examination with abnormal findings: Secondary | ICD-10-CM | POA: Insufficient documentation

## 2021-09-15 NOTE — Assessment & Plan Note (Signed)

## 2021-09-15 NOTE — Assessment & Plan Note (Signed)
Current flare, trigger painting indoors ?Anti inflammatory, PT and x ray needed of C spine ?

## 2021-09-15 NOTE — Assessment & Plan Note (Signed)
Current flare , x ray lumbar and thoracic spine , PT x 4 weeks, naproxen, prednisone and flexeril short term ?

## 2021-09-15 NOTE — Assessment & Plan Note (Signed)
Elevated at visit , forgot to take medication, states rushing/ stressed, poor sleep the night before ?DASH diet and commitment to daily physical activity for a minimum of 30 minutes discussed and encouraged, as a part of hypertension management. ?The importance of attaining a healthy weight is also discussed. ? ?BP/Weight 09/10/2021 07/11/2021 04/12/2021 11/21/2020 08/13/2020 03/07/2020 12/27/2019  ?Systolic BP 520 - 802 233 - 136 136  ?Diastolic BP 88 - 82 80 - 86 86  ?Wt. (Lbs) 174.04 174.8 174.12 168.5 174 174 174.08  ?BMI 28.09 28.21 28.1 26.79 28.08 28.08 27.26  ? ? ? ? ?

## 2021-09-18 ENCOUNTER — Encounter: Payer: Self-pay | Admitting: Family Medicine

## 2021-09-23 ENCOUNTER — Telehealth: Payer: Self-pay | Admitting: Adult Health

## 2021-09-23 ENCOUNTER — Other Ambulatory Visit: Payer: Self-pay | Admitting: Family Medicine

## 2021-09-23 DIAGNOSIS — E042 Nontoxic multinodular goiter: Secondary | ICD-10-CM

## 2021-09-23 NOTE — Telephone Encounter (Signed)
Patient wants you to call her 

## 2021-09-23 NOTE — Telephone Encounter (Signed)
Called patient back she states that she is okay and doesn't need to talk anymore.  ?

## 2021-09-30 DIAGNOSIS — J019 Acute sinusitis, unspecified: Secondary | ICD-10-CM | POA: Diagnosis not present

## 2021-10-01 ENCOUNTER — Other Ambulatory Visit: Payer: Self-pay

## 2021-10-01 ENCOUNTER — Ambulatory Visit (HOSPITAL_COMMUNITY): Payer: BC Managed Care – PPO

## 2021-10-01 ENCOUNTER — Ambulatory Visit (HOSPITAL_COMMUNITY)
Admission: RE | Admit: 2021-10-01 | Discharge: 2021-10-01 | Disposition: A | Discharge status: Home / Self Care | Payer: BC Managed Care – PPO | Source: Ambulatory Visit | Attending: Family Medicine | Admitting: Family Medicine

## 2021-10-01 DIAGNOSIS — E042 Nontoxic multinodular goiter: Secondary | ICD-10-CM | POA: Diagnosis not present

## 2021-10-04 ENCOUNTER — Encounter: Payer: Self-pay | Admitting: Family Medicine

## 2021-10-08 ENCOUNTER — Other Ambulatory Visit: Payer: Self-pay | Admitting: Family Medicine

## 2021-10-08 DIAGNOSIS — M542 Cervicalgia: Secondary | ICD-10-CM

## 2021-10-08 DIAGNOSIS — M545 Low back pain, unspecified: Secondary | ICD-10-CM

## 2021-10-29 ENCOUNTER — Encounter: Payer: Self-pay | Admitting: Family Medicine

## 2021-10-29 ENCOUNTER — Ambulatory Visit: Payer: BC Managed Care – PPO | Admitting: Family Medicine

## 2021-10-29 DIAGNOSIS — J019 Acute sinusitis, unspecified: Secondary | ICD-10-CM | POA: Insufficient documentation

## 2021-10-29 MED ORDER — FLUCONAZOLE 150 MG PO TABS: ORAL_TABLET | ORAL | 0 refills | Status: DC

## 2021-10-29 NOTE — Patient Instructions (Addendum)
F/U as before, call if you need me sooner  ? ?Work excuse for yesterday since you report that you were feeling too ill to work yesterday ? ? ?Take antibiotic course as prescribed by Dr at urgent  care, I have prescribed once weekly fluconazole in case you develop vaginal yeast irtch ? ?Thanks for choosing [facility], we consider it a privelige to serve you. ?  ?

## 2021-10-29 NOTE — Progress Notes (Signed)
Virtual Visit via Telephone Note ? ?I connected with Reynold Bowen on 10/29/21 at  4:40 PM EDT by telephone and verified that I am speaking with the correct person using two identifiers. ? ?Location: ?Patient: home ?Provider: office ?  ?I discussed the limitations, risks, security and privacy concerns of performing an evaluation and management service by telephone and the availability of in person appointments. I also discussed with the patient that there may be a patient responsible charge related to this service. The patient expressed understanding and agreed to proceed. ? ? ?History of Present Illness: ?1 week ago started having sinus pressure treated in UC , antibiotic prescribed but just started taking yesterday, feels better already, yesterdday was awful, on amox 500 mg  tid # 60 , also was coughing , states that yesterdayb she felt too ill to work and needs a work note. Currently on the job ?  ?Observations/Objective: ?There were no vitals taken for this visit. ?Good communication with no confusion and intact memory. ?Alert and oriented x 3 ?Nasal speech and intermittent coughing ? ? ?Assessment and Plan: ?Acute sinus infection ?Advised to take antibiotic course for  a minimum of 10 days, work excuse for yesterday to be provided based on report  ? ? ?Follow Up Instructions: ? ?  ?I discussed the assessment and treatment plan with the patient. The patient was provided an opportunity to ask questions and all were answered. The patient agreed with the plan and demonstrated an understanding of the instructions. ?  ?The patient was advised to call back or seek an in-person evaluation if the symptoms worsen or if the condition fails to improve as anticipated. ? ?I provided 9 minutes of non-face-to-face time during this encounter. ? ? ?Tula Nakayama, MD ? ?

## 2021-10-29 NOTE — Assessment & Plan Note (Signed)
Advised to take antibiotic course for  a minimum of 10 days, work excuse for yesterday to be provided based on report  ?

## 2021-10-30 ENCOUNTER — Telehealth: Payer: Self-pay

## 2021-10-30 ENCOUNTER — Encounter: Payer: Self-pay | Admitting: Family Medicine

## 2021-10-30 NOTE — Telephone Encounter (Signed)
Patient called needs a work note saying she was out of work on Monday, 04.24.2023 and return 04.25.2023. Send to [employer] and she can print out the letter. ?

## 2021-10-30 NOTE — Telephone Encounter (Signed)
done

## 2021-11-02 ENCOUNTER — Other Ambulatory Visit: Payer: Self-pay | Admitting: Family Medicine

## 2021-11-05 ENCOUNTER — Ambulatory Visit (HOSPITAL_COMMUNITY): Payer: BC Managed Care – PPO | Attending: Family Medicine | Admitting: Physical Therapy

## 2021-11-18 ENCOUNTER — Telehealth: Payer: Self-pay | Admitting: Adult Health

## 2021-11-18 MED ORDER — METRONIDAZOLE 0.75 % VA GEL: 1.0000 | Freq: Every day | VAGINAL | 0 refills | Status: DC

## 2021-11-18 NOTE — Addendum Note (Signed)
Addended by: Derrek Monaco A on: 11/18/2021 01:04 PM ? ? Modules accepted: Orders ? ?

## 2021-11-18 NOTE — Telephone Encounter (Signed)
Returned pt's call, two identifiers used.  ?Pt c/o of fishy odor and creamy thin discharge for 4 days and is confident it is BV. Pt took Diflucan when she began having a discharge, but it didn't help her symptoms. Pt told her concerns would be sent to [facility] for review. ?

## 2021-11-18 NOTE — Telephone Encounter (Signed)
Pt aware I sent in [facility] to [facility] ?

## 2021-11-18 NOTE — Telephone Encounter (Signed)
Patient would like you to call her. She wants a rx called in. Please advise.  ?

## 2021-11-20 ENCOUNTER — Ambulatory Visit: Payer: BC Managed Care – PPO | Admitting: Family Medicine

## 2021-11-25 ENCOUNTER — Other Ambulatory Visit: Payer: BC Managed Care – PPO | Admitting: Adult Health

## 2021-11-26 ENCOUNTER — Ambulatory Visit (HOSPITAL_COMMUNITY): Payer: BC Managed Care – PPO | Admitting: Physical Therapy

## 2021-11-26 ENCOUNTER — Emergency Department (HOSPITAL_COMMUNITY)
Admission: EM | Admit: 2021-11-26 | Discharge: 2021-11-26 | Disposition: A | Discharge status: Home / Self Care | Payer: BC Managed Care – PPO | Attending: Emergency Medicine | Admitting: Emergency Medicine

## 2021-11-26 ENCOUNTER — Emergency Department (HOSPITAL_COMMUNITY): Payer: BC Managed Care – PPO

## 2021-11-26 ENCOUNTER — Other Ambulatory Visit: Payer: Self-pay

## 2021-11-26 ENCOUNTER — Encounter (HOSPITAL_COMMUNITY): Payer: Self-pay | Admitting: Emergency Medicine

## 2021-11-26 DIAGNOSIS — M79605 Pain in left leg: Secondary | ICD-10-CM | POA: Insufficient documentation

## 2021-11-26 DIAGNOSIS — Z91010 Allergy to peanuts: Secondary | ICD-10-CM | POA: Diagnosis not present

## 2021-11-26 DIAGNOSIS — M25562 Pain in left knee: Secondary | ICD-10-CM | POA: Diagnosis not present

## 2021-11-26 NOTE — ED Provider Notes (Signed)
[facility] EMERGENCY DEPARTMENT Provider Note   CSN: [id number] Arrival date & time: 11/26/21  1840     History  Chief Complaint  Patient presents with   Leg Pain    [name] is a 51 y.o. female.   Leg Pain     [name] is a 51 y.o. female who presents to the Emergency Department complaining of sudden onset of pain over her left knee and lower leg.  Pain began around 1 PM this evening.  She describes a aching throbbing pain around her knee and upper calf area.  Pain also radiates to her lower thigh.  Pain is worse with movement.  She denies known injury.  Does state that she sits most of the day at her job, she works from home.  She denies any swelling or redness of her knee.  No fevers, chills, numbness or weakness of her leg.  She denies any chest pain or shortness of breath.  No history of PE or DVT.  Is a non-smoker.  Home Medications Prior to Admission medications   Medication Sig Start Date End Date Taking? Authorizing Provider  amoxicillin (AMOXIL) 500 MG capsule Take 1,500 mg by mouth 2 (two) times daily. 10/25/21   [provider]  cetirizine HCl (ZYRTEC) 1 MG/ML solution TAKE 1/2 TO 1 TEASPOON BY MOUTH DAILY AS , NEEDED, FOR ALLERGIES (OTC) 05/07/21   Lindell Spar, MD  cyclobenzaprine (FLEXERIL) 5 MG tablet Take 1 tablet (5 mg total) by mouth at bedtime. Take one tablet at bedtime for muscle spasm for 1 week, then as needed 09/10/21   Fayrene Helper, MD  diltiazem (CARDIZEM CD) 120 MG 24 hr capsule TAKE ONE CAPSULE BY MOUTH DAILY 08/26/21   Fayrene Helper, MD  ergocalciferol (VITAMIN D2) 1.25 MG (50000 UT) capsule Take 1 capsule (50,000 Units total) by mouth once a week. One capsule once weekly 09/11/21   Fayrene Helper, MD  fluconazole (DIFLUCAN) 150 MG tablet Take one tablet by mouth once weekly as needed, for vaginal itch , associated  with antibiotic use 10/29/21   Fayrene Helper, MD  hydrochlorothiazide (HYDRODIURIL) 25 MG tablet Take 1  tablet (25 mg total) by mouth daily. 04/12/21   Fayrene Helper, MD  metoprolol tartrate (LOPRESSOR) 25 MG tablet TAKE 1/2 TABLET BY MOUTH TWICE DAILY 11/02/21   [place], Dewaine Conger, FNP  metroNIDAZOLE (METROGEL VAGINAL) 0.75 % vaginal gel Place 1 Applicatorful vaginally at bedtime. 11/18/21   Estill Dooms, NP  naproxen (NAPROSYN) 500 MG tablet Take 1 tablet (500 mg total) by mouth 2 (two) times daily with a meal. 09/10/21   Fayrene Helper, MD  potassium chloride (KLOR-CON 10) 10 MEQ tablet Take four tablets by mouth two times daily 04/12/21   Fayrene Helper, MD  predniSONE (DELTASONE) 10 MG tablet Take 1 tablet (10 mg total) by mouth 2 (two) times daily with a meal. 09/10/21   Fayrene Helper, MD      Allergies    Biaxin [clarithromycin], Iodinated contrast media, Shellfish allergy, Yellow jacket venom [bee venom], and Peanut-containing drug products    Review of Systems   Review of Systems  Respiratory:  Negative for shortness of breath.   Cardiovascular:  Negative for chest pain.  Gastrointestinal:  Negative for nausea and vomiting.  Musculoskeletal:  Positive for arthralgias (Left knee pain, left lower leg pain).  Skin:  Negative for color change, rash and wound.  Neurological:  Negative for weakness and numbness.  Physical Exam Updated Vital Signs BP 138/82 (BP Location: Right Arm)   Pulse 62   Temp 98 F (36.7 C) (Oral)   Resp 17   Ht '5\' 6"'$  (1.676 m)   Wt 78.9 kg   SpO2 100%   BMI 28.08 kg/m  Physical Exam Vitals and nursing note reviewed.  Constitutional:      General: She is not in acute distress.    Appearance: Normal appearance.  Cardiovascular:     Rate and Rhythm: Normal rate and regular rhythm.     Pulses: Normal pulses.  Pulmonary:     Effort: Pulmonary effort is normal.     Breath sounds: Normal breath sounds.  Chest:     Chest wall: No tenderness.  Musculoskeletal:        General: Tenderness present. No swelling or signs of injury.      Right lower leg: No edema.     Left lower leg: No edema.     Comments: Diffuse tenderness to palpation of the anterior and posterior left knee.  Some tenderness to the popliteal fossa and proximal left lower leg.  No excessive warmth, erythema or edema noted.  No palpable effusion of the knee joint.  There is some pain on range of motion of the knee.  Skin:    General: Skin is warm.     Findings: No erythema.  Neurological:     General: No focal deficit present.     Mental Status: She is alert.     Sensory: No sensory deficit.     Motor: No weakness.    ED Results / Procedures / Treatments   Labs (all labs ordered are listed, but only abnormal results are displayed) Labs Reviewed - No data to display  EKG None  Radiology DG Knee Complete 4 Views Left  Result Date: 11/26/2021 CLINICAL DATA:  Atraumatic left knee pain. EXAM: LEFT KNEE - COMPLETE 4+ VIEW COMPARISON:  None Available. FINDINGS: No evidence of fracture, dislocation, or joint effusion. No evidence of arthropathy or other focal bone abnormality. Soft tissues are unremarkable. IMPRESSION: Negative. Electronically Signed   By: Virgina Norfolk M.D.   On: 11/26/2021 20:23    Procedures Procedures    Medications Ordered in ED Medications - No data to display  ED Course/ Medical Decision Making/ A&P                           Medical Decision Making Amount and/or Complexity of Data Reviewed Radiology: ordered.   Patient here for evaluation of left knee pain that began this afternoon.  No known injury.  She denies any swelling redness of the leg or knee.  She has pain to the posterior knee that radiates to the proximal lower leg and distal thigh.  Pain reproducible with palpation and movement.  Differential would include Baker's cyst, knee sprain, DVT.  Risk factor for DVT would include sedentary job.  She is a non-smoker, not currently on hormone replacement and no prior PEs or DVTs.  No chest pain or shortness of  breath.  I suspect patient's symptoms are musculoskeletal.  No concerning symptoms for septic joint or cellulitis  Low clinical suspicion for DVT, vital signs are reassuring and she is well-appearing.  Ultrasound imaging unavailable after hours.  We will have patient return tomorrow for venous imaging of the lower extremity.  She is agreeable to this plan.       Final Clinical Impression(s) / ED Diagnoses  Final diagnoses:  Acute pain of left knee    Rx / DC Orders ED Discharge Orders     None         Bufford Lope 11/26/21 2111    Daleen Bo, MD 11/27/21 [phone]

## 2021-11-26 NOTE — ED Triage Notes (Signed)
Pt c/o left knee and leg pain since about lunch time today. Pt denies injury.

## 2021-11-26 NOTE — Discharge Instructions (Signed)
The x-ray of your knee this evening was reassuring.  You have been scheduled to return here tomorrow for an ultrasound of your leg.  Call the scheduling department at [phone] to arrange appointment time.  Elevate your leg tonight and minimal weightbearing.  Please follow-up with your primary care provider.  If your ultrasound is negative for a blood clot of your leg, you may call Dr. Ruthe Mannan office to arrange a follow-up appointment if your knee pain is not improving.

## 2021-11-27 ENCOUNTER — Encounter: Payer: Self-pay | Admitting: Internal Medicine

## 2021-11-27 ENCOUNTER — Ambulatory Visit (INDEPENDENT_AMBULATORY_CARE_PROVIDER_SITE_OTHER): Payer: BC Managed Care – PPO | Admitting: Internal Medicine

## 2021-11-27 ENCOUNTER — Ambulatory Visit (HOSPITAL_COMMUNITY)
Admission: RE | Admit: 2021-11-27 | Discharge: 2021-11-27 | Disposition: A | Discharge status: Home / Self Care | Payer: BC Managed Care – PPO | Source: Ambulatory Visit | Attending: Emergency Medicine | Admitting: Emergency Medicine

## 2021-11-27 ENCOUNTER — Ambulatory Visit (HOSPITAL_COMMUNITY)
Admission: RE | Admit: 2021-11-27 | Discharge: 2021-11-27 | Disposition: A | Discharge status: Home / Self Care | Payer: BC Managed Care – PPO | Source: Ambulatory Visit | Attending: Family Medicine | Admitting: Family Medicine

## 2021-11-27 ENCOUNTER — Other Ambulatory Visit: Payer: Self-pay | Admitting: Family Medicine

## 2021-11-27 ENCOUNTER — Ambulatory Visit: Payer: BC Managed Care – PPO | Admitting: Internal Medicine

## 2021-11-27 VITALS — BP 118/82 | HR 52 | Resp 18 | Ht 66.0 in | Wt 172.4 lb

## 2021-11-27 DIAGNOSIS — M79662 Pain in left lower leg: Secondary | ICD-10-CM | POA: Diagnosis not present

## 2021-11-27 DIAGNOSIS — M549 Dorsalgia, unspecified: Secondary | ICD-10-CM | POA: Diagnosis not present

## 2021-11-27 DIAGNOSIS — M542 Cervicalgia: Secondary | ICD-10-CM

## 2021-11-27 DIAGNOSIS — M5412 Radiculopathy, cervical region: Secondary | ICD-10-CM

## 2021-11-27 DIAGNOSIS — M546 Pain in thoracic spine: Secondary | ICD-10-CM | POA: Diagnosis not present

## 2021-11-27 DIAGNOSIS — M545 Low back pain, unspecified: Secondary | ICD-10-CM

## 2021-11-27 DIAGNOSIS — M25562 Pain in left knee: Secondary | ICD-10-CM | POA: Diagnosis not present

## 2021-11-27 MED ORDER — CYCLOBENZAPRINE HCL 5 MG PO TABS: 5.0000 mg | ORAL_TABLET | Freq: Two times a day (BID) | ORAL | 0 refills | Status: DC | PRN

## 2021-11-27 MED ORDER — NAPROXEN 500 MG PO TABS: 500.0000 mg | ORAL_TABLET | Freq: Two times a day (BID) | ORAL | 1 refills | Status: DC

## 2021-11-27 NOTE — Patient Instructions (Signed)
Please take Naproxen as needed for back and knee pain.  Please take Flexeril for muscle spasms/stiffness.

## 2021-11-27 NOTE — Progress Notes (Unsigned)
Acute Office Visit  Subjective:    Patient ID: [name], female    DOB: 1971/01/25, 51 y.o.   MRN: [id number]  Chief Complaint  Patient presents with   Knee Pain    Pt has been having back and neck pain for awhile had xray done also went to er 5-24 had US done shoot pain in left leg started 11-26-21    HPI Patient is in today for complaint of acute left knee pain, for the last 2 days.  She complains of constant, sharp knee pain, which radiates to her left leg area.  She also complains of acute on chronic low back pain and neck pain.  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  She went to ER last yesterday for knee pain, and had x-ray of the knee, which was unremarkable.  She had x-rays of cervical, thoracic and lumbar spine this morning.  Neck pain: She has chronic neck pain, which radiates to her upper arm area on the left side.  She has also noticed numbness and tingling of the hands, worse at nighttime.  She denies any recent injury or fall.  Past Medical History:  Diagnosis Date   Back pain    BV (bacterial vaginosis) 09/16/2013   Chest pain 2000   right    Contraceptive management 09/16/2013   Encephalocele ([place]) 1994   Enlarged thyroid    HSV-2 (herpes simplex virus 2) infection    Hypertension    Irregular intermenstrual bleeding 04/27/2015   Obesity    Preterm labor    Sleep disorder    Thyroid nodule    Urinary frequency 12/14/2014   Vaginal discharge 09/16/2013    Past Surgical History:  Procedure Laterality Date   BREAST BIOPSY  2007   btl  [place] N/A 05/06/2016   Procedure: DILATATION & CURETTAGE/HYSTEROSCOPY WITH NOVASURE ABLATION;  Surgeon: Jonnie Kind, MD;  Location: [facility];  Service: Gynecology;  Laterality: N/A;  length-5; width-2.6; power-72 ; time- 1 minute 42 seconds   LAPAROSCOPIC BILATERAL SALPINGECTOMY Bilateral  05/06/2016   Procedure: LAPAROSCOPIC BILATERAL SALPINGECTOMY;  Surgeon: Jonnie Kind, MD;  Location: [facility];  Service: Gynecology;  Laterality: Bilateral;   RETAINED PLACENTA REMOVAL     TUBAL LIGATION     04/2016   tubal ligation reversal  2001   WISDOM TOOTH EXTRACTION      Family History  Problem Relation Age of Onset   Anuerysm Mother    Stroke Mother    Cancer Father        thyroid    Hypertension Father    Cancer Paternal Grandmother        breast   Seizures Maternal Grandmother     Social History   Socioeconomic History   Marital status: Legally Separated    Spouse name: Not on file   Number of children: 4   Years of education: Not on file   Highest education level: Not on file  Occupational History   Not on file  Tobacco Use   Smoking status: Never   Smokeless tobacco: Never  Vaping Use   Vaping Use: Never used  Substance and Sexual Activity   Alcohol use: No   Drug use: No   Sexual activity: Not Currently    Partners: Male    Birth control/protection: Surgical  Comment: tubal and ablation  Other Topics Concern   Not on file  Social History Narrative   Not on file   Social Determinants of Health   Financial Resource Strain: Not on file  Food Insecurity: Not on file  Transportation Needs: Not on file  Physical Activity: Not on file  Stress: Not on file  Social Connections: Not on file  Intimate Partner Violence: Not on file    Outpatient Medications Prior to Visit  Medication Sig Dispense Refill   cetirizine HCl (ZYRTEC) 1 MG/ML solution TAKE 1/2 TO 1 TEASPOON BY MOUTH DAILY AS , NEEDED, FOR ALLERGIES (OTC) 120 mL 3   diltiazem (CARDIZEM CD) 120 MG 24 hr capsule TAKE ONE CAPSULE BY MOUTH DAILY 90 capsule 1   ergocalciferol (VITAMIN D2) 1.25 MG (50000 UT) capsule Take 1 capsule (50,000 Units total) by mouth once a week. One capsule once weekly 12 capsule 2   fluconazole (DIFLUCAN) 150 MG tablet Take one tablet by mouth once weekly as needed,  for vaginal itch , associated  with antibiotic use 3 tablet 0   hydrochlorothiazide (HYDRODIURIL) 25 MG tablet Take 1 tablet (25 mg total) by mouth daily. 90 tablet 3   metoprolol tartrate (LOPRESSOR) 25 MG tablet TAKE 1/2 TABLET BY MOUTH TWICE DAILY 90 tablet 0   metroNIDAZOLE (METROGEL VAGINAL) 0.75 % vaginal gel Place 1 Applicatorful vaginally at bedtime. 70 g 0   potassium chloride (KLOR-CON 10) 10 MEQ tablet Take four tablets by mouth two times daily 240 tablet 11   amoxicillin (AMOXIL) 500 MG capsule Take 1,500 mg by mouth 2 (two) times daily. (Patient not taking: Reported on 11/27/2021)     cyclobenzaprine (FLEXERIL) 5 MG tablet Take 1 tablet (5 mg total) by mouth at bedtime. Take one tablet at bedtime for muscle spasm for 1 week, then as needed 30 tablet 0   naproxen (NAPROSYN) 500 MG tablet Take 1 tablet (500 mg total) by mouth 2 (two) times daily with a meal. 14 tablet 0   predniSONE (DELTASONE) 10 MG tablet Take 1 tablet (10 mg total) by mouth 2 (two) times daily with a meal. (Patient not taking: Reported on 11/27/2021) 10 tablet 0   No facility-administered medications prior to visit.    Allergies  Allergen Reactions   Biaxin [Clarithromycin] Diarrhea, Nausea And Vomiting and Other (See Comments)    Blood in stool   Iodinated Contrast Media Shortness Of Breath    Pt states she had difficulty breathing after receiving iv contrast in 2013.    Shellfish Allergy    Yellow Jacket Venom [Bee Venom] Swelling   Peanut-Containing Drug Products Rash    Review of Systems  Constitutional:  Negative for chills and fever.  HENT:  Negative for congestion, sinus pressure, sinus pain and sore throat.   Eyes:  Negative for pain and discharge.  Respiratory:  Negative for cough and shortness of breath.   Cardiovascular:  Negative for chest pain and palpitations.  Gastrointestinal:  Negative for abdominal pain, constipation, diarrhea, nausea and vomiting.  Endocrine: Negative for polydipsia and  polyuria.  Genitourinary:  Negative for dysuria and hematuria.  Musculoskeletal:  Positive for arthralgias (Left knee), back pain and neck pain. Negative for neck stiffness.  Skin:  Negative for rash.  Neurological:  Negative for dizziness and weakness.  Psychiatric/Behavioral:  Negative for agitation and behavioral problems.       Objective:    Physical Exam Vitals reviewed.  Constitutional:      General: She is not  in acute distress.    Appearance: She is not diaphoretic.  HENT:     Head: Normocephalic and atraumatic.     Nose: Nose normal.     Mouth/Throat:     Mouth: Mucous membranes are moist.  Eyes:     General: No scleral icterus.    Extraocular Movements: Extraocular movements intact.  Cardiovascular:     Rate and Rhythm: Normal rate and regular rhythm.     Pulses: Normal pulses.     Heart sounds: Normal heart sounds. No murmur heard. Pulmonary:     Breath sounds: Normal breath sounds. No wheezing or rales.  Musculoskeletal:     Cervical back: Neck supple. No tenderness. Decreased range of motion (Due to pain).     Left knee: Effusion present. Decreased range of motion (Due to pain).     Right lower leg: No edema.     Left lower leg: No edema.  Skin:    General: Skin is warm.     Findings: No rash.  Neurological:     General: No focal deficit present.     Mental Status: She is alert and oriented to person, place, and time.  Psychiatric:        Mood and Affect: Mood normal.        Behavior: Behavior normal.    BP 118/82 (BP Location: Right Arm, Patient Position: Sitting, Cuff Size: Normal)   Pulse (!) 52   Resp 18   Ht '5\' 6"'$  (1.676 m)   Wt 172 lb 6.4 oz (78.2 kg)   SpO2 99%   BMI 27.83 kg/m  Wt Readings from Last 3 Encounters:  11/27/21 172 lb 6.4 oz (78.2 kg)  11/26/21 174 lb (78.9 kg)  09/10/21 174 lb 0.6 oz (78.9 kg)        Assessment & Plan:   Problem List Items Addressed This Visit       Nervous and Auditory   Cervical radiculopathy     Chronic neck pain with numbness and tingling of the hand could be cervical radiculopathy Check x-ray of cervical spine May need MRI of cervical spine Flexeril as needed for muscle stiffness       Relevant Medications   cyclobenzaprine (FLEXERIL) 5 MG tablet     Other   Acute low back pain without sciatica    Acute on chronic low back pain Could be due to recent exertional activity, and at her shop Avoid heavy lifting and frequent bending Heating pad as needed Advised to use back brace for support Naproxen as needed for pain Flexeril as needed for muscle stiffness       Relevant Medications   naproxen (NAPROSYN) 500 MG tablet   cyclobenzaprine (FLEXERIL) 5 MG tablet   Other Relevant Orders   Ambulatory referral to Orthopedic Surgery   Other Visit Diagnoses     Acute pain of left knee    -  Primary Likely due to recent exertion X-ray of the knee reviewed Naproxen PRN for pain Referred to Orthopedic surgery - [facility] as per patient preference   Relevant Medications   naproxen (NAPROSYN) 500 MG tablet   Other Relevant Orders   Ambulatory referral to Orthopedic Surgery        Meds ordered this encounter  Medications   naproxen (NAPROSYN) 500 MG tablet    Sig: Take 1 tablet (500 mg total) by mouth 2 (two) times daily with a meal.    Dispense:  30 tablet    Refill:  1   cyclobenzaprine (FLEXERIL) 5 MG tablet    Sig: Take 1 tablet (5 mg total) by mouth every 12 (twelve) hours as needed for muscle spasms.    Dispense:  30 tablet    Refill:  0     Chealsea Paske Keith Rake, MD

## 2021-11-28 ENCOUNTER — Other Ambulatory Visit: Payer: BC Managed Care – PPO | Admitting: Adult Health

## 2021-11-28 ENCOUNTER — Ambulatory Visit: Payer: BC Managed Care – PPO | Admitting: Family Medicine

## 2021-11-28 DIAGNOSIS — M5412 Radiculopathy, cervical region: Secondary | ICD-10-CM | POA: Insufficient documentation

## 2021-11-28 DIAGNOSIS — M545 Low back pain, unspecified: Secondary | ICD-10-CM | POA: Insufficient documentation

## 2021-11-28 NOTE — Assessment & Plan Note (Signed)
Chronic neck pain with numbness and tingling of the hand could be cervical radiculopathy Check x-ray of cervical spine May need MRI of cervical spine Flexeril as needed for muscle stiffness

## 2021-11-28 NOTE — Assessment & Plan Note (Addendum)
Acute on chronic low back pain Could be due to recent exertional activity, and at her shop Avoid heavy lifting and frequent bending Heating pad as needed Advised to use back brace for support Naproxen as needed for pain Flexeril as needed for muscle stiffness

## 2021-12-09 DIAGNOSIS — M79671 Pain in right foot: Secondary | ICD-10-CM | POA: Diagnosis not present

## 2021-12-09 DIAGNOSIS — L110 Acquired keratosis follicularis: Secondary | ICD-10-CM | POA: Diagnosis not present

## 2021-12-09 DIAGNOSIS — M79672 Pain in left foot: Secondary | ICD-10-CM | POA: Diagnosis not present

## 2021-12-09 DIAGNOSIS — Q828 Other specified congenital malformations of skin: Secondary | ICD-10-CM | POA: Diagnosis not present

## 2021-12-10 ENCOUNTER — Ambulatory Visit: Payer: BC Managed Care – PPO | Admitting: Family Medicine

## 2021-12-26 ENCOUNTER — Ambulatory Visit: Payer: BC Managed Care – PPO

## 2021-12-26 ENCOUNTER — Encounter: Payer: Self-pay | Admitting: Family Medicine

## 2021-12-26 ENCOUNTER — Ambulatory Visit (INDEPENDENT_AMBULATORY_CARE_PROVIDER_SITE_OTHER): Payer: BC Managed Care – PPO | Admitting: Family Medicine

## 2021-12-26 DIAGNOSIS — U071 COVID-19: Secondary | ICD-10-CM | POA: Diagnosis not present

## 2021-12-26 DIAGNOSIS — R059 Cough, unspecified: Secondary | ICD-10-CM | POA: Diagnosis not present

## 2021-12-26 DIAGNOSIS — R0981 Nasal congestion: Secondary | ICD-10-CM | POA: Diagnosis not present

## 2021-12-27 ENCOUNTER — Encounter: Payer: Self-pay | Admitting: Family Medicine

## 2021-12-27 ENCOUNTER — Telehealth: Payer: Self-pay | Admitting: Family Medicine

## 2021-12-29 LAB — NOVEL CORONAVIRUS, NAA: SARS-CoV-2, NAA: NOT DETECTED

## 2022-02-05 ENCOUNTER — Encounter: Payer: Self-pay | Admitting: Adult Health

## 2022-02-05 ENCOUNTER — Ambulatory Visit (INDEPENDENT_AMBULATORY_CARE_PROVIDER_SITE_OTHER): Payer: BC Managed Care – PPO | Admitting: Adult Health

## 2022-02-05 ENCOUNTER — Other Ambulatory Visit (HOSPITAL_COMMUNITY)
Admission: RE | Admit: 2022-02-05 | Discharge: 2022-02-05 | Disposition: A | Discharge status: Home / Self Care | Payer: BC Managed Care – PPO | Source: Ambulatory Visit | Attending: Adult Health | Admitting: Adult Health

## 2022-02-05 VITALS — BP 133/82 | HR 61 | Ht 66.25 in | Wt 171.5 lb

## 2022-02-05 DIAGNOSIS — Z113 Encounter for screening for infections with a predominantly sexual mode of transmission: Secondary | ICD-10-CM | POA: Diagnosis not present

## 2022-02-05 DIAGNOSIS — Z1211 Encounter for screening for malignant neoplasm of colon: Secondary | ICD-10-CM

## 2022-02-05 DIAGNOSIS — Z1212 Encounter for screening for malignant neoplasm of rectum: Secondary | ICD-10-CM

## 2022-02-05 DIAGNOSIS — Z01419 Encounter for gynecological examination (general) (routine) without abnormal findings: Secondary | ICD-10-CM | POA: Diagnosis not present

## 2022-02-05 LAB — HEMOCCULT GUIAC POC 1CARD (OFFICE): Fecal Occult Blood, POC: NEGATIVE

## 2022-02-05 NOTE — Progress Notes (Signed)
Patient ID: [name], female   DOB: May 23, 1971, 51 y.o.   MRN: [id number] History of Present Illness: [name] is a 51 year old black female, divorced, [id number], in for a well woman gyn exam. She wants STD testing.  She has opened a new clothing shop.  Lab Results  Component Value Date   DIAGPAP  11/21/2020    - Negative for intraepithelial lesion or malignancy (NILM)   HPV NOT DETECTED 01/28/2018   [place] Negative 11/21/2020    Current Medications, Allergies, Past Medical History, Past Surgical History, Family History and Social History were reviewed in [employer] record.     Review of Systems: Patient denies any headaches, hearing loss, fatigue, blurred vision, shortness of breath, chest pain, abdominal pain, problems with bowel movements, urination, or intercourse. No joint pain or mood swings.     Physical Exam:BP 133/82 (BP Location: Left Arm, Patient Position: Sitting, Cuff Size: Normal)   Pulse 61   Ht 5' 6.25" (1.683 m)   Wt 171 lb 8 oz (77.8 kg)   LMP 01/15/2022   BMI 27.47 kg/m   General:  Well developed, well nourished, no acute distress Skin:  Warm and dry Neck:  Midline trachea,enlarged  thyroid, good ROM, no lymphadenopathy Lungs; Clear to auscultation bilaterally Breast:  No dominant palpable mass, retraction, or nipple discharge, she regular irregularities  Cardiovascular: Regular rate and rhythm Abdomen:  Soft, non tender, no hepatosplenomegaly Pelvic:  External genitalia is normal in appearance, no lesions.  The vagina is normal in appearance. Urethra has no lesions or masses. The cervix is bulbous. CV swab obtained.  Uterus is felt to be normal size, shape, and contour.  No adnexal masses or tenderness noted.Bladder is non tender, no masses felt. Rectal: Good sphincter tone, no polyps, or hemorrhoids felt.  Hemoccult negative. Extremities/musculoskeletal:  No swelling or varicosities noted, no clubbing or cyanosis Psych:  No mood changes,  alert and cooperative,seems happy AA is 0 Fall risk is low    02/05/2022    1:54 PM 12/26/2021    8:34 AM 11/27/2021   11:05 AM  Depression screen PHQ 2/9  Decreased Interest 0 0 0  Down, Depressed, Hopeless 0 0 0  PHQ - 2 Score 0 0 0  Altered sleeping 0    Tired, decreased energy 0    Change in appetite 0    Feeling bad or failure about yourself  0    Trouble concentrating 0    Moving slowly or fidgety/restless 0    Suicidal thoughts 0    PHQ-9 Score 0         02/05/2022    1:54 PM 11/21/2020   12:04 PM  GAD 7 : Generalized Anxiety Score  Nervous, Anxious, on Edge 0 0  Control/stop worrying 0 0  Worry too much - different things 0 0  Trouble relaxing 0 0  Restless 0 0  Easily annoyed or irritable 0 0  Afraid - awful might happen 0 0  Total GAD 7 Score 0 0    Upstream - 02/05/22 1353       Pregnancy Intention Screening   Does the patient want to become pregnant in the next year? No    Does the patient's partner want to become pregnant in the next year? No    Would the patient like to discuss contraceptive options today? No      Contraception Wrap Up   Current Method Female Sterilization    End Method Female Sterilization  Examination chaperoned by Levy Pupa LPN  Impression and Plan: 1. Encounter for well woman exam with routine gynecological exam Physical in 1 year Pap in 2025 Labs with PCP Mammogram was negative, 07/03/21  2. Encounter for screening fecal occult blood testing Hemoccult was negative  - POCT occult blood stool  3. Screen for STD (sexually transmitted disease) CV swab sent for GC/CHL,trich, BV and yeast  - Cervicovaginal ancillary only( [place])  4. Screening for colorectal cancer Will order cologuard for her  - Cologuard

## 2022-02-06 ENCOUNTER — Other Ambulatory Visit: Payer: Self-pay | Admitting: Nurse Practitioner

## 2022-02-07 LAB — CERVICOVAGINAL ANCILLARY ONLY
Bacterial Vaginitis (gardnerella): NEGATIVE
Candida Glabrata: NEGATIVE
Candida Vaginitis: NEGATIVE
Chlamydia: NEGATIVE
Comment: NEGATIVE
Comment: NEGATIVE
Comment: NEGATIVE
Comment: NEGATIVE
Comment: NEGATIVE
Comment: NORMAL
Neisseria Gonorrhea: NEGATIVE
Trichomonas: NEGATIVE

## 2022-02-13 ENCOUNTER — Telehealth: Payer: Self-pay | Admitting: Family Medicine

## 2022-02-13 ENCOUNTER — Other Ambulatory Visit: Payer: Self-pay | Admitting: Nurse Practitioner

## 2022-02-13 NOTE — Telephone Encounter (Signed)
This has been refilled.

## 2022-02-13 NOTE — Telephone Encounter (Signed)
Patient needs refill on metoprolol tartrate (LOPRESSOR) 25 MG tablet   Patient is currently at pharm and med was denied pick up .

## 2022-02-21 ENCOUNTER — Encounter: Payer: Self-pay | Admitting: Family Medicine

## 2022-02-21 ENCOUNTER — Ambulatory Visit: Payer: BC Managed Care – PPO | Admitting: Family Medicine

## 2022-02-21 ENCOUNTER — Ambulatory Visit
Admission: EM | Admit: 2022-02-21 | Discharge: 2022-02-21 | Disposition: A | Discharge status: Home / Self Care | Payer: BC Managed Care – PPO | Attending: Nurse Practitioner | Admitting: Nurse Practitioner

## 2022-02-21 ENCOUNTER — Encounter: Payer: Self-pay | Admitting: *Deleted

## 2022-02-21 ENCOUNTER — Telehealth: Payer: Self-pay | Admitting: Family Medicine

## 2022-02-21 DIAGNOSIS — M546 Pain in thoracic spine: Secondary | ICD-10-CM | POA: Diagnosis not present

## 2022-02-21 MED ORDER — KETOROLAC TROMETHAMINE 30 MG/ML IJ SOLN
30.0000 mg | Freq: Once | INTRAMUSCULAR | Status: AC
  Administered 2022-02-21: 30 mg via INTRAMUSCULAR

## 2022-02-21 NOTE — ED Provider Notes (Signed)
[facility]    CSN: [id number] Arrival date & time: 02/21/22  1030      History   Chief Complaint Chief Complaint  Patient presents with   Back Pain    HPI [name] is a 51 y.o. female.   Patient presents for 1 day of right upper back and arm pain.  Reports the pain started this morning when she woke up.  She denies recent injury, accident, or trauma to her right upper back or right arm.  She does endorse heavy lifting yesterday and reports she was a little sore when she went to bed last night.  Currently, the pain is severe and sharp.  She denies radiation of pain down to her fingertips on the right arm.  Also endorses a little bit of chronic right-sided low back pain, however denies radiation down her right leg or left leg.  Has not taken anything for the pain so far.  She denies numbness or tingling, saddle esthesia, neck pain, weakness, decreased range of motion, fever, nausea/vomiting, headache, and vision changes.  Patient reports a history of right low back pain, right-sided neck pain.  Reports she has prescriptions for cyclobenzaprine and naproxen at home as well as over-the-counter Tylenol.  She has not tried any of these medications yet.     Past Medical History:  Diagnosis Date   Back pain    BV (bacterial vaginosis) 09/16/2013   Chest pain 2000   right    Contraceptive management 09/16/2013   Encephalocele ([place]) 1994   Enlarged thyroid    HSV-2 (herpes simplex virus 2) infection    Hypertension    Irregular intermenstrual bleeding 04/27/2015   Obesity    Preterm labor    Sleep disorder    Thyroid nodule    Urinary frequency 12/14/2014   Vaginal discharge 09/16/2013    Patient Active Problem List   Diagnosis Date Noted   Screen for STD (sexually transmitted disease) 02/05/2022   Encounter for well woman exam with routine gynecological exam 02/05/2022   Encounter for screening fecal occult blood testing 02/05/2022   Acute low back pain without  sciatica 11/28/2021   Cervical radiculopathy 11/28/2021   Acute sinus infection 10/29/2021   Benign skin mole 03/07/2020   Callus of foot 03/07/2020   Chronic superficial venous thrombosis of lower extremity 06/05/2019   Allergic reaction 12/07/2018   Screening for colorectal cancer 01/28/2018   Multiple allergies 11/28/2015   Prediabetes 08/01/2012   Overweight (BMI 25.0-29.9) 08/01/2012   Vitamin D deficiency 07/28/2012   Allergic rhinitis 10/16/2009   Neck pain 03/17/2009   HYPOKALEMIA 03/14/2008   THYROID NODULE 10/26/2007   Essential hypertension 10/26/2007   Back pain with radiation 10/26/2007    Past Surgical History:  Procedure Laterality Date   BREAST BIOPSY  2007   btl  [place] N/A 05/06/2016   Procedure: DILATATION & CURETTAGE/HYSTEROSCOPY WITH NOVASURE ABLATION;  Surgeon: Jonnie Kind, MD;  Location: [facility];  Service: Gynecology;  Laterality: N/A;  length-5; width-2.6; power-72 ; time- 1 minute 42 seconds   LAPAROSCOPIC BILATERAL SALPINGECTOMY Bilateral 05/06/2016   Procedure: LAPAROSCOPIC BILATERAL SALPINGECTOMY;  Surgeon: Jonnie Kind, MD;  Location: [facility];  Service: Gynecology;  Laterality: Bilateral;   RETAINED PLACENTA REMOVAL     TUBAL LIGATION     04/2016   tubal ligation reversal  2001   WISDOM TOOTH EXTRACTION      OB History     [place]  6   Para  5   Term  2   Preterm  3   AB  1   Living  5      SAB  0   IAB  1   Ectopic  0   Multiple  0   Live Births               Home Medications    Prior to Admission medications   Medication Sig Start Date End Date Taking? Authorizing Provider  cetirizine HCl (ZYRTEC) 1 MG/ML solution TAKE 1/2 TO 1 TEASPOON BY MOUTH DAILY AS , NEEDED, FOR ALLERGIES (OTC) 05/07/21  Yes Lindell Spar, MD  diltiazem (CARDIZEM CD) 120 MG 24 hr capsule TAKE ONE CAPSULE BY MOUTH DAILY 08/26/21  Yes Fayrene Helper, MD  ergocalciferol  (VITAMIN D2) 1.25 MG (50000 UT) capsule Take 1 capsule (50,000 Units total) by mouth once a week. One capsule once weekly 09/11/21  Yes Fayrene Helper, MD  hydrochlorothiazide (HYDRODIURIL) 25 MG tablet Take 1 tablet (25 mg total) by mouth daily. 04/12/21  Yes Fayrene Helper, MD  metoprolol tartrate (LOPRESSOR) 25 MG tablet TAKE 1/2 TABLET BY MOUTH TWICE DAILY 02/13/22  Yes Fayrene Helper, MD  potassium chloride (KLOR-CON 10) 10 MEQ tablet Take four tablets by mouth two times daily 04/12/21  Yes Fayrene Helper, MD    Family History Family History  Problem Relation Age of Onset   Anuerysm Mother    Stroke Mother    Cancer Father        thyroid    Hypertension Father    Cancer Paternal Grandmother        breast   Seizures Maternal Grandmother     Social History Social History   Tobacco Use   Smoking status: Never   Smokeless tobacco: Never  Vaping Use   Vaping Use: Never used  Substance Use Topics   Alcohol use: No   Drug use: No     Allergies   Biaxin [clarithromycin], Iodinated contrast media, Shellfish allergy, Yellow jacket venom [bee venom], and Peanut-containing drug products   Review of Systems Review of Systems Per HPI  Physical Exam Triage Vital Signs ED Triage Vitals  Enc Vitals Group     BP 02/21/22 1039 121/81     Pulse Rate 02/21/22 1039 60     Resp 02/21/22 1039 18     Temp 02/21/22 1039 98.3 F (36.8 C)     Temp Source 02/21/22 1039 Oral     SpO2 02/21/22 1039 96 %     Weight --      Height --      Head Circumference --      Peak Flow --      Pain Score 02/21/22 1037 8     Pain Loc --      Pain Edu? --      Excl. in [place]? --    No data found.  Updated Vital Signs BP 121/81 (BP Location: Left Arm)   Pulse 60   Temp 98.3 F (36.8 C) (Oral)   Resp 18   LMP 02/15/2022 (Approximate)   SpO2 96%   Visual Acuity Right Eye Distance:   Left Eye Distance:   Bilateral Distance:    Right Eye Near:   Left Eye Near:     Bilateral Near:     Physical Exam Vitals and nursing note reviewed.  Constitutional:      General: She is not in acute  distress.    Appearance: Normal appearance. She is not toxic-appearing.  HENT:     Mouth/Throat:     Mouth: Mucous membranes are moist.     Pharynx: Oropharynx is clear.  Pulmonary:     Effort: Pulmonary effort is normal. No respiratory distress.  Musculoskeletal:       Arms:     Cervical back: Normal range of motion. No rigidity or tenderness.     Comments: Inspection: No swelling, obvious deformity, or redness to thoracic or lumbar spine.  No bruising to posterior trunk noted.   Palpation: Tender to palpation in the areas marked; no obvious deformities palpated ROM: Full ROM to thoracic and lumbar spine as well as bilateral upper extremities, neck Strength: 5/5 neck, bilateral upper extremities Neurovascular: neurovascularly intact in left and right upper extremity   Lymphadenopathy:     Cervical: No cervical adenopathy.  Neurological:     Mental Status: She is alert.      UC Treatments / Results  Labs (all labs ordered are listed, but only abnormal results are displayed) Labs Reviewed - No data to display  EKG   Radiology No results found.  Procedures Procedures (including critical care time)  Medications Ordered in UC Medications  ketorolac (TORADOL) 30 MG/ML injection 30 mg (30 mg Intramuscular Given 02/21/22 1100)    Initial Impression / Assessment and Plan / UC Course  I have reviewed the triage vital signs and the nursing notes.  Pertinent labs & imaging results that were available during my care of the patient were reviewed by me and considered in my medical decision making (see chart for details).    Patient is a very pleasant, well-appearing 51 year old female presenting for acute upper right-sided back pain and right arm pain.  No red flags in history or on exam today.  Patient is well-appearing, normotensive, not tachycardic,  oxygenating well on room air and afebrile.  Suspect musculoskeletal cause.  Toradol 30 mg IM given today to help with pain.  Discouraged use of NSAIDs today.  Can take Tylenol 500 to 1000 mg every 6 hours as needed along with muscle relaxant she has been prescribed previously.  Starting tomorrow, encouraged alternating NSAIDs with Tylenol almost relaxant as needed, light stretching.  Encouraged hydration with water.  Follow-up with primary care provider if symptoms persist or worsen despite treatment.  Note given for work.  The patient was given the opportunity to ask questions.  All questions answered to their satisfaction.  The patient is in agreement to this plan.   Final Clinical Impressions(s) / UC Diagnoses   Final diagnoses:  Acute right-sided thoracic back pain     Discharge Instructions      - The pain you are having today is likely muscular.  We have given you a shot of Toradol 30 mg to help with the pain.  Please do not take any Aleve, ibuprofen, naproxen, Motrin, or NSAIDs today. -You can use Tylenol 500 to 1000 mg every 6 hours as needed for pain -You can also take the muscle relaxer as prescribed to help with pain; this might make you sleepy so do not take with alcohol or while driving or operating heavy machinery -Pain should improve over the next 1 to 2 weeks.  If it does not, follow-up with your primary care provider    ED Prescriptions   None    PDMP not reviewed this encounter.   Eulogio Bear, NP 02/21/22 1131

## 2022-02-21 NOTE — Discharge Instructions (Signed)
-   The pain you are having today is likely muscular.  We have given you a shot of Toradol 30 mg to help with the pain.  Please do not take any Aleve, ibuprofen, naproxen, Motrin, or NSAIDs today. -You can use Tylenol 500 to 1000 mg every 6 hours as needed for pain -You can also take the muscle relaxer as prescribed to help with pain; this might make you sleepy so do not take with alcohol or while driving or operating heavy machinery -Pain should improve over the next 1 to 2 weeks.  If it does not, follow-up with your primary care provider

## 2022-02-21 NOTE — ED Triage Notes (Signed)
Patient complains of back pain and right arm pain from moving furniture yesterday and woke up this morning and couldn't hardly move. Has not taken any OTC meds.

## 2022-02-21 NOTE — Telephone Encounter (Signed)
Scheduled pt for today at 2:40pm. She states her was moving stuff last night and her back went out. She is not wanting to wait till this afternoon and asked if you could please call her?

## 2022-02-21 NOTE — Telephone Encounter (Signed)
I responded to her mychart message and told her if she couldn't wait then she could go to urgent care but let us know if she wanted to cancel

## 2022-03-14 ENCOUNTER — Encounter: Payer: Self-pay | Admitting: Family Medicine

## 2022-03-14 ENCOUNTER — Telehealth: Payer: Self-pay | Admitting: Family Medicine

## 2022-03-14 NOTE — Telephone Encounter (Signed)
Patient called in regard to med. Pharm is completely out of patient med, and  patient has not found a pharm that has medication in stock.  Patient has been out of med for 3 days and wants to know what she needs to do.

## 2022-03-14 NOTE — Telephone Encounter (Signed)
What medication is she asking about?

## 2022-03-14 NOTE — Telephone Encounter (Signed)
It was cardizem. She found it at [facility]

## 2022-03-14 NOTE — Telephone Encounter (Signed)
Patient did not state medication name, wants a call back.

## 2022-03-21 DIAGNOSIS — H2513 Age-related nuclear cataract, bilateral: Secondary | ICD-10-CM | POA: Diagnosis not present

## 2022-03-31 ENCOUNTER — Encounter: Payer: Self-pay | Admitting: Family Medicine

## 2022-04-01 ENCOUNTER — Ambulatory Visit: Payer: BC Managed Care – PPO | Admitting: Family Medicine

## 2022-04-01 ENCOUNTER — Other Ambulatory Visit: Payer: Self-pay | Admitting: Family Medicine

## 2022-04-02 ENCOUNTER — Encounter: Payer: Self-pay | Admitting: Family Medicine

## 2022-04-02 ENCOUNTER — Ambulatory Visit (INDEPENDENT_AMBULATORY_CARE_PROVIDER_SITE_OTHER): Payer: BC Managed Care – PPO | Admitting: Family Medicine

## 2022-04-02 ENCOUNTER — Other Ambulatory Visit: Payer: Self-pay

## 2022-04-02 VITALS — BP 130/80 | HR 64 | Ht 72.0 in | Wt 175.0 lb

## 2022-04-02 DIAGNOSIS — M545 Low back pain, unspecified: Secondary | ICD-10-CM | POA: Diagnosis not present

## 2022-04-02 DIAGNOSIS — F411 Generalized anxiety disorder: Secondary | ICD-10-CM

## 2022-04-02 DIAGNOSIS — M79605 Pain in left leg: Secondary | ICD-10-CM

## 2022-04-02 DIAGNOSIS — M542 Cervicalgia: Secondary | ICD-10-CM

## 2022-04-02 DIAGNOSIS — R7303 Prediabetes: Secondary | ICD-10-CM

## 2022-04-02 DIAGNOSIS — I10 Essential (primary) hypertension: Secondary | ICD-10-CM

## 2022-04-02 DIAGNOSIS — M79604 Pain in right leg: Secondary | ICD-10-CM

## 2022-04-02 DIAGNOSIS — F4323 Adjustment disorder with mixed anxiety and depressed mood: Secondary | ICD-10-CM | POA: Diagnosis not present

## 2022-04-02 MED ORDER — PREDNISONE 10 MG PO TABS: 10.0000 mg | ORAL_TABLET | Freq: Two times a day (BID) | ORAL | 0 refills | Status: DC

## 2022-04-02 MED ORDER — METHYLPREDNISOLONE ACETATE 80 MG/ML IJ SUSP
80.0000 mg | Freq: Once | INTRAMUSCULAR | Status: AC
  Administered 2022-04-02: 80 mg via INTRAMUSCULAR

## 2022-04-02 MED ORDER — GABAPENTIN 100 MG PO CAPS: ORAL_CAPSULE | ORAL | 2 refills | Status: DC

## 2022-04-02 NOTE — Patient Instructions (Addendum)
F/U in January, call if you need me sooner  Work excuse for this pm to return tomorrow  Depo medrol 80 mg iM in office followed by 5 day prednisone course and bedtime gabapentin  MRI of neck and low back are ordered  HBA1C , chem 7 and eGFR today  You are referred for therapy   Please reconsider vaccines which you need  Thanks for choosing [facility], we consider it a privelige to serve you.

## 2022-04-03 DIAGNOSIS — F4323 Adjustment disorder with mixed anxiety and depressed mood: Secondary | ICD-10-CM | POA: Insufficient documentation

## 2022-04-03 LAB — BMP8+EGFR
BUN/Creatinine Ratio: 10 (ref 9–23)
BUN: 10 mg/dL (ref 6–24)
CO2: 26 mmol/L (ref 20–29)
Calcium: 9.1 mg/dL (ref 8.7–10.2)
Chloride: 103 mmol/L (ref 96–106)
Creatinine, Ser: 0.98 mg/dL (ref 0.57–1.00)
Glucose: 74 mg/dL (ref 70–99)
Potassium: 3.3 mmol/L — ABNORMAL LOW (ref 3.5–5.2)
Sodium: 144 mmol/L (ref 134–144)
eGFR: 70 mL/min/{1.73_m2} (ref >59)

## 2022-04-03 LAB — HEMOGLOBIN A1C
Est. average glucose Bld gHb Est-mCnc: 117 mg/dL
Hgb A1c MFr Bld: 5.7 % — ABNORMAL HIGH (ref 4.8–5.6)

## 2022-04-03 NOTE — Assessment & Plan Note (Signed)
muyltiple stressors in personal lifes involving 3 of 4 adult children. Refer therapy

## 2022-04-03 NOTE — Progress Notes (Signed)
   [name]     MRN: [id number]      DOB: February 02, 1971   HPI [name] is here for follow up and re-evaluation of chronic medical conditions, medication management and review of any available recent lab and radiology data.  Preventive health is updated, specifically  Cancer screening and Immunization.   Ongoing uncontrolled neck and back pain x 2 months Back pain radiates  down leg and c/o episode of urinary incontinence , pain is rated at a 7 to 8  C/o anxiety and stress due to concerns re 3 of her 4 adult children , health and legal problems , desires therapy, will refer  ROS Denies recent fever or chills. Denies sinus pressure, nasal congestion, ear pain or sore throat. Denies chest congestion, productive cough or wheezing. Denies chest pains, palpitations and leg swelling Denies abdominal pain, nausea, vomiting,diarrhea or constipation.   Denies dysuria, frequency, hesitancy or incontinence. Denies skin break down or rash.   PE  BP 130/80 (BP Location: Right Arm, Patient Position: Sitting)   Pulse 64   Ht 6' (1.829 m)   Wt 175 lb (79.4 kg)   SpO2 96%   BMI 23.73 kg/m   Patient alert and oriented and in no cardiopulmonary distress.  HEENT: No facial asymmetry, EOMI,     Neck decreased RoM neck.  Chest: Clear to auscultation bilaterally.  CVS: S1, S2 no murmurs, no S3.Regular rate.  ABD: Soft non tender.   Ext: No edema  MS: decreased  ROM lumbar spine, dadequate in shoulders, hips and knees.  Skin: Intact, no ulcerations or rash noted.  CNS: CN 2-12 intact, power,  normal throughout.no focal deficits noted.   Assessment & Plan  Neck pain Ongoing debilitating neck pain since 02/2022, keeps pt awake and limits function, b no improvement despite treatment, needs mRI c spine  Adjustment reaction with anxiety and depression muyltiple stressors in personal lifes involving 3 of 4 adult children. Refer therapy  Essential hypertension Controlled, no change in  medication DASH diet and commitment to daily physical activity for a minimum of 30 minutes discussed and encouraged, as a part of hypertension management. The importance of attaining a healthy weight is also discussed.     04/02/2022    1:21 PM 02/21/2022   10:39 AM 02/05/2022    1:48 PM 11/27/2021   11:04 AM 11/26/2021    9:23 PM 11/26/2021    8:01 PM 11/26/2021    7:58 PM  BP/Weight  Systolic BP 867 619 509 [phone]   Diastolic BP 80 81 82 82 80 82   Wt. (Lbs) 175  171.5 172.4   174  BMI 23.73 kg/m2  27.47 kg/m2 27.83 kg/m2   28.08 kg/m2

## 2022-04-03 NOTE — Assessment & Plan Note (Signed)
Ongoing debilitating neck pain since 02/2022, keeps pt awake and limits function, b no improvement despite treatment, needs mRI c spine

## 2022-04-03 NOTE — Assessment & Plan Note (Signed)
Controlled, no change in medication DASH diet and commitment to daily physical activity for a minimum of 30 minutes discussed and encouraged, as a part of hypertension management. The importance of attaining a healthy weight is also discussed.     04/02/2022    1:21 PM 02/21/2022   10:39 AM 02/05/2022    1:48 PM 11/27/2021   11:04 AM 11/26/2021    9:23 PM 11/26/2021    8:01 PM 11/26/2021    7:58 PM  BP/Weight  Systolic BP 003 704 888 [phone]   Diastolic BP 80 81 82 82 80 82   Wt. (Lbs) 175  171.5 172.4   174  BMI 23.73 kg/m2  27.47 kg/m2 27.83 kg/m2   28.08 kg/m2

## 2022-04-04 ENCOUNTER — Telehealth: Payer: Self-pay

## 2022-04-04 NOTE — Telephone Encounter (Signed)
[employer]   Copied Noted sleeved

## 2022-04-07 DIAGNOSIS — Q828 Other specified congenital malformations of skin: Secondary | ICD-10-CM | POA: Diagnosis not present

## 2022-04-07 DIAGNOSIS — L110 Acquired keratosis follicularis: Secondary | ICD-10-CM | POA: Diagnosis not present

## 2022-04-07 DIAGNOSIS — M79672 Pain in left foot: Secondary | ICD-10-CM | POA: Diagnosis not present

## 2022-04-07 DIAGNOSIS — M79671 Pain in right foot: Secondary | ICD-10-CM | POA: Diagnosis not present

## 2022-04-09 ENCOUNTER — Encounter: Payer: Self-pay | Admitting: Family Medicine

## 2022-04-09 NOTE — Telephone Encounter (Signed)
Forms received awaiting completion

## 2022-04-11 ENCOUNTER — Ambulatory Visit (INDEPENDENT_AMBULATORY_CARE_PROVIDER_SITE_OTHER): Payer: BC Managed Care – PPO | Admitting: Family Medicine

## 2022-04-11 ENCOUNTER — Encounter: Payer: Self-pay | Admitting: Family Medicine

## 2022-04-11 VITALS — BP 143/87 | HR 64 | Ht 67.0 in | Wt 175.0 lb

## 2022-04-11 DIAGNOSIS — T7840XA Allergy, unspecified, initial encounter: Secondary | ICD-10-CM

## 2022-04-11 DIAGNOSIS — M549 Dorsalgia, unspecified: Secondary | ICD-10-CM | POA: Diagnosis not present

## 2022-04-11 DIAGNOSIS — Z0279 Encounter for issue of other medical certificate: Secondary | ICD-10-CM

## 2022-04-11 NOTE — Patient Instructions (Addendum)
F/U In Jan as before, call if you need me sooner  Allergic reaction  noted in record  Fill gabapentin, not prednisone  FMLA will be completed and you  will be contacted  Work excuse for today will be provided  Thanks for choosing [facility], we consider it a privelige to serve you.

## 2022-04-11 NOTE — Progress Notes (Signed)
   [name]     MRN: [id number]      DOB: [birth year]   HPI [name] is here with c/o itching localized at injection site last week Thursday as well as generalized itching States 2 days ago, developed redness and rash at site which actually spread  down thigh, also states fingers got swollen, neosporin topically has helped  with this Tired unable to sit still, states does not want to work today, as tired and exhausted from no sleep and also because of discomfort when seated Has seen Orthopedics , awaiting MRI prior to any definitive plan  ROS Denies recent fever or chills. Denies sinus pressure, nasal congestion, ear pain or sore throat. Denies chest congestion, productive cough or wheezing. Denies chest pains, palpitations and leg swelling Denies abdominal pain, nausea, vomiting,diarrhea or constipation.   Denies dysuria, frequency, hesitancy or incontinence. . Denies headaches, seizures, numbness, or tingling.   PE  BP (!) 143/87 (BP Location: Right Arm, Patient Position: Sitting, Cuff Size: Large)   Pulse 64   Ht '5\' 7"'$  (1.702 m)   Wt 175 lb (79.4 kg)   SpO2 97%   BMI 27.41 kg/m   Patient alert and oriented and in no cardiopulmonary distress.In pain and looks tired and naxious  HEENT: No facial asymmetry, EOMI,     Neck supple .  Chest: Clear to auscultation bilaterally.  CVS: S1, S2 no murmurs, no S3.Regular rate.  ABD: Soft non tender.   Ext: No edema  MS: decreased  ROM spine,  normal in shoulders, hips and knees.  Skin: Intact, erythematous macular rash on buttock and upper thigh   Psych: Good eye contact, normal affect. Memory intact not anxious or depressed appearing.  CNS: CN 2-12 intact, power,  normal throughout.no focal deficits noted.   Assessment & Plan  Allergic reaction Localized allergic reaction to depo medrol injection IM, with erythematous rash  Back pain with radiation Chronic and unchanged, fill gabapentin and not prednisone

## 2022-04-14 ENCOUNTER — Telehealth: Payer: Self-pay | Admitting: Family Medicine

## 2022-04-14 ENCOUNTER — Encounter: Payer: Self-pay | Admitting: Family Medicine

## 2022-04-14 ENCOUNTER — Other Ambulatory Visit: Payer: Self-pay

## 2022-04-14 MED ORDER — DILTIAZEM HCL ER COATED BEADS 120 MG PO CP24: 120.0000 mg | ORAL_CAPSULE | Freq: Every day | ORAL | 2 refills | Status: DC

## 2022-04-14 NOTE — Assessment & Plan Note (Signed)
Localized allergic reaction to depo medrol injection IM, with erythematous rash

## 2022-04-14 NOTE — Telephone Encounter (Signed)
Patient needs refill on   diltiazem (CARDIZEM CD) 120 MG 24 hr capsule    [place]

## 2022-04-14 NOTE — Telephone Encounter (Signed)
Refills sent to [facility] per patient request.

## 2022-04-14 NOTE — Assessment & Plan Note (Signed)
Chronic and unchanged, fill gabapentin and not prednisone

## 2022-04-21 ENCOUNTER — Encounter: Payer: Self-pay | Admitting: Family Medicine

## 2022-04-21 ENCOUNTER — Ambulatory Visit (INDEPENDENT_AMBULATORY_CARE_PROVIDER_SITE_OTHER): Payer: BC Managed Care – PPO | Admitting: Family Medicine

## 2022-04-21 VITALS — BP 118/72 | HR 86 | Ht 66.0 in | Wt 175.1 lb

## 2022-04-21 DIAGNOSIS — T7840XD Allergy, unspecified, subsequent encounter: Secondary | ICD-10-CM

## 2022-04-21 MED ORDER — HYDROXYZINE PAMOATE 25 MG PO CAPS: 25.0000 mg | ORAL_CAPSULE | Freq: Three times a day (TID) | ORAL | 0 refills | Status: DC | PRN

## 2022-04-21 NOTE — Progress Notes (Signed)
   Acute Office Visit  Subjective:     Patient ID: [name], female    DOB: September 10, 1970, 51 y.o.   MRN: [id number]  Chief Complaint  Patient presents with   Allergic Reaction    Pt reports allergic reaction from Toradol shot on 04/02/22, has itching and pain going down her left leg.    HPI Patient is in today for complaints of pruritus and swelling of the left hip.  She reports having an allergic reaction to Toradol on 04/02/2022 with localized rash and generalized itching.  She saw Dr. Moshe Cipro on 04/12/2019 with similar complaints and she was started on Zyrtec to help relieve pruritus.  She notes that the medication works briefly, and her symptoms continue to reemerge.  She reported allergies to prednisone.  She reported using Vaseline to help with pruritus.  Review of Systems  Respiratory:  Negative for shortness of breath.   Cardiovascular:  Negative for chest pain and leg swelling.  Genitourinary:  Negative for dysuria and urgency.  Skin:  Positive for rash.  Psychiatric/Behavioral:  Negative for memory loss. The patient is not nervous/anxious.         Objective:    BP 118/72   Pulse 86   Ht '5\' 6"'$  (1.676 m)   Wt 175 lb 1.3 oz (79.4 kg)   SpO2 96%   BMI 28.26 kg/m    Physical Exam HENT:     Head: Normocephalic.     Right Ear: Tympanic membrane normal.  Cardiovascular:     Rate and Rhythm: Normal rate and regular rhythm.     Pulses: Normal pulses.     Heart sounds: Normal heart sounds.  Musculoskeletal:     Left hip: Tenderness present. Normal range of motion.  Skin:    Findings: Rash present.  Neurological:     Mental Status: She is alert.     No results found for any visits on 04/21/22.      Assessment & Plan:   Problem List Items Addressed This Visit       Other   Allergic reaction - Primary    Encouraged the patient to apply ice to the affected sites to decrease swelling Encouraged to start taking hydroxyzine 25 mg for pruritus Encouraged to  return to the clinic if her symptoms worsen or do not improve      Relevant Medications   hydrOXYzine (VISTARIL) 25 MG capsule    Meds ordered this encounter  Medications   hydrOXYzine (VISTARIL) 25 MG capsule    Sig: Take 1 capsule (25 mg total) by mouth every 8 (eight) hours as needed.    Dispense:  30 capsule    Refill:  0    No follow-ups on file.  Alvira Monday, FNP

## 2022-04-21 NOTE — Patient Instructions (Addendum)
I appreciate the opportunity to provide care to you today!    Follow up: Dr. Claybon Jabs  Please start taking hydroxyzine 25 mg by mouth every 8 hours as needed for itching.  Please apply ice to the affected site decrease swelling    Please continue to a heart-healthy diet and increase your physical activities. Try to exercise for 65mns at least three times a week.      It was a pleasure to see you and I look forward to continuing to work together on your health and well-being. Please do not hesitate to call the office if you need care or have questions about your care.   Have a wonderful day and week. With Gratitude, GAlvira MondayMSN, FNP-BC

## 2022-04-21 NOTE — Assessment & Plan Note (Signed)
Encouraged the patient to apply ice to the affected sites to decrease swelling Encouraged to start taking hydroxyzine 25 mg for pruritus Encouraged to return to the clinic if her symptoms worsen or do not improve

## 2022-04-24 ENCOUNTER — Ambulatory Visit (HOSPITAL_COMMUNITY)
Admission: RE | Admit: 2022-04-24 | Discharge: 2022-04-24 | Disposition: A | Discharge status: Home / Self Care | Payer: BC Managed Care – PPO | Source: Ambulatory Visit | Attending: Family Medicine | Admitting: Family Medicine

## 2022-04-24 DIAGNOSIS — M79604 Pain in right leg: Secondary | ICD-10-CM | POA: Insufficient documentation

## 2022-04-24 DIAGNOSIS — M545 Low back pain, unspecified: Secondary | ICD-10-CM | POA: Diagnosis not present

## 2022-04-24 DIAGNOSIS — M5117 Intervertebral disc disorders with radiculopathy, lumbosacral region: Secondary | ICD-10-CM | POA: Diagnosis not present

## 2022-04-24 DIAGNOSIS — M79605 Pain in left leg: Secondary | ICD-10-CM | POA: Insufficient documentation

## 2022-04-24 DIAGNOSIS — M4727 Other spondylosis with radiculopathy, lumbosacral region: Secondary | ICD-10-CM | POA: Diagnosis not present

## 2022-04-24 DIAGNOSIS — M47812 Spondylosis without myelopathy or radiculopathy, cervical region: Secondary | ICD-10-CM | POA: Diagnosis not present

## 2022-04-24 DIAGNOSIS — M542 Cervicalgia: Secondary | ICD-10-CM | POA: Insufficient documentation

## 2022-04-28 ENCOUNTER — Telehealth: Payer: Self-pay | Admitting: Family Medicine

## 2022-04-28 NOTE — Telephone Encounter (Signed)
Pt called stating she is wanting to speak to nurse in regards to Dr. Moshe Cipro sending results to her yesterday & about how to proceed?    Pt states she was given a neurosurgery dr name she needs to be referred to.   [facility]

## 2022-04-29 ENCOUNTER — Telehealth: Payer: Self-pay | Admitting: Family Medicine

## 2022-04-29 ENCOUNTER — Other Ambulatory Visit: Payer: Self-pay

## 2022-04-29 ENCOUNTER — Encounter: Payer: Self-pay | Admitting: Family Medicine

## 2022-04-29 DIAGNOSIS — M542 Cervicalgia: Secondary | ICD-10-CM

## 2022-04-29 NOTE — Telephone Encounter (Signed)
Patient aware.

## 2022-04-29 NOTE — Telephone Encounter (Signed)
Referral sent 

## 2022-04-29 NOTE — Telephone Encounter (Signed)
Pt called stating [facility] is needing to know what specific dr she is needing to see. They do not want to just schedule her with first available because once she sees one provider she is not able to switch. Can you please contact patient?

## 2022-04-30 ENCOUNTER — Telehealth: Payer: Self-pay | Admitting: Family Medicine

## 2022-04-30 DIAGNOSIS — G5601 Carpal tunnel syndrome, right upper limb: Secondary | ICD-10-CM | POA: Diagnosis not present

## 2022-04-30 DIAGNOSIS — M47812 Spondylosis without myelopathy or radiculopathy, cervical region: Secondary | ICD-10-CM | POA: Diagnosis not present

## 2022-04-30 DIAGNOSIS — M549 Dorsalgia, unspecified: Secondary | ICD-10-CM | POA: Diagnosis not present

## 2022-04-30 NOTE — Telephone Encounter (Signed)
See mychart msg patient sent to provider.

## 2022-04-30 NOTE — Telephone Encounter (Signed)
Pt called stating she just left Neurosurgery & has a ist of things that she would like to let Dr. Moshe Cipro know about & also possible needs another referral. Can you please contact patient when available?

## 2022-05-01 ENCOUNTER — Encounter: Payer: Self-pay | Admitting: Family Medicine

## 2022-05-01 ENCOUNTER — Ambulatory Visit (INDEPENDENT_AMBULATORY_CARE_PROVIDER_SITE_OTHER): Payer: BC Managed Care – PPO | Admitting: Family Medicine

## 2022-05-01 DIAGNOSIS — R768 Other specified abnormal immunological findings in serum: Secondary | ICD-10-CM | POA: Diagnosis not present

## 2022-05-01 DIAGNOSIS — K529 Noninfective gastroenteritis and colitis, unspecified: Secondary | ICD-10-CM | POA: Diagnosis not present

## 2022-05-01 DIAGNOSIS — E876 Hypokalemia: Secondary | ICD-10-CM | POA: Diagnosis not present

## 2022-05-01 DIAGNOSIS — R52 Pain, unspecified: Secondary | ICD-10-CM

## 2022-05-01 DIAGNOSIS — R7689 Other specified abnormal immunological findings in serum: Secondary | ICD-10-CM

## 2022-05-01 MED ORDER — DIPHENOXYLATE-ATROPINE 2.5-0.025 MG PO TABS: 1.0000 | ORAL_TABLET | Freq: Four times a day (QID) | ORAL | 0 refills | Status: DC | PRN

## 2022-05-01 MED ORDER — ONDANSETRON HCL 4 MG PO TABS: 4.0000 mg | ORAL_TABLET | Freq: Three times a day (TID) | ORAL | 0 refills | Status: DC | PRN

## 2022-05-01 NOTE — Progress Notes (Signed)
Virtual Visit via Telephone Note  I connected with [name] on 05/01/22 at  3:40 PM EDT by telephone and verified that I am speaking with the correct person using two identifiers.  Location: Patient: home Provider: office   I discussed the limitations, risks, security and privacy concerns of performing an evaluation and management service by telephone and the availability of in person appointments. I also discussed with the patient that there may be a patient responsible charge related to this service. The patient expressed understanding and agreed to proceed.   History of Present Illness: Vomit twice since 6:30 pm yesterday , had nausea earlier today , no more at this time, stomach griping which started last pm is beginning to settle down, unable to eat much , just saltine cracker, 4 bM , watery, not formed , brown, no blood or mucus , no fver or chills, one other person in house is sick, no work today C/o generalized disabling pain,neurosurgeon recommends rheumatology eval and she has also been referred to PT by neurosurgery with f/u Needs letter for work explaining her management plan an her need for multiple absences during this period to  take care of her health, will have this prepapred for her Observations/Objective: There were no vitals taken for this visit. Good communication with no confusion and intact memory. Alert and oriented x 3 No signs of respiratory distress during speech   Assessment and Plan: Acute gastroenteritis starte 1 day ago, symptomatic to the extent that she is unable y to work today. Lomotil and zofran prescribed , push water and BRAT diet. Work excuse x 1 day to return in am Check potassium, has chronic hypokalemia  HYPOKALEMIA Check level and replete as needed  Generalized pain Disabling generalized joint pains, RF is positive, rheumatology to E/M   Follow Up Instructions:    I discussed the assessment and treatment plan with the patient. The  patient was provided an opportunity to ask questions and all were answered. The patient agreed with the plan and demonstrated an understanding of the instructions.   The patient was advised to call back or seek an in-person evaluation if the symptoms worsen or if the condition fails to improve as anticipated.  I provided 13 minutes of non-face-to-face time during this encounter.   Tula Nakayama, MD

## 2022-05-01 NOTE — Patient Instructions (Signed)
F/U as before, call if you need me sooner  Keep hydrated  Chem 7 and eGR, rheumatoid factor and eSR today  Work excuse for today return tomorrow  You are referred urgently  to Rheumatology  Zofran for nausea and lomotil for diarrheah are prescribed  Thanks for choosing [facility], we consider it a privelige to serve you.

## 2022-05-02 DIAGNOSIS — R52 Pain, unspecified: Secondary | ICD-10-CM | POA: Diagnosis not present

## 2022-05-04 LAB — RHEUMATOID FACTOR: Rheumatoid fact SerPl-aCnc: 175.8 IU/mL — ABNORMAL HIGH (ref <14.0)

## 2022-05-04 LAB — BMP8+EGFR
BUN/Creatinine Ratio: 12 (ref 9–23)
BUN: 13 mg/dL (ref 6–24)
CO2: 26 mmol/L (ref 20–29)
Calcium: 9.3 mg/dL (ref 8.7–10.2)
Chloride: 99 mmol/L (ref 96–106)
Creatinine, Ser: 1.09 mg/dL — ABNORMAL HIGH (ref 0.57–1.00)
Glucose: 80 mg/dL (ref 70–99)
Potassium: 2.8 mmol/L — ABNORMAL LOW (ref 3.5–5.2)
Sodium: 141 mmol/L (ref 134–144)
eGFR: 62 mL/min/{1.73_m2} (ref >59)

## 2022-05-04 LAB — SEDIMENTATION RATE: Sed Rate: 37 mm/hr (ref 0–40)

## 2022-05-05 ENCOUNTER — Encounter: Payer: Self-pay | Admitting: Family Medicine

## 2022-05-05 ENCOUNTER — Other Ambulatory Visit: Payer: Self-pay

## 2022-05-05 DIAGNOSIS — K529 Noninfective gastroenteritis and colitis, unspecified: Secondary | ICD-10-CM | POA: Insufficient documentation

## 2022-05-05 DIAGNOSIS — R768 Other specified abnormal immunological findings in serum: Secondary | ICD-10-CM | POA: Insufficient documentation

## 2022-05-05 DIAGNOSIS — R52 Pain, unspecified: Secondary | ICD-10-CM | POA: Insufficient documentation

## 2022-05-05 DIAGNOSIS — E876 Hypokalemia: Secondary | ICD-10-CM

## 2022-05-05 NOTE — Assessment & Plan Note (Signed)
Check level and replete as needed

## 2022-05-05 NOTE — Assessment & Plan Note (Signed)
Disabling generalized joint pains, RF is positive, rheumatology to E/M

## 2022-05-05 NOTE — Assessment & Plan Note (Signed)
starte 1 day ago, symptomatic to the extent that she is unable y to work today. Lomotil and zofran prescribed , push water and BRAT diet. Work excuse x 1 day to return in am Check potassium, has chronic hypokalemia

## 2022-05-06 ENCOUNTER — Encounter: Payer: Self-pay | Admitting: Family Medicine

## 2022-05-06 DIAGNOSIS — G5603 Carpal tunnel syndrome, bilateral upper limbs: Secondary | ICD-10-CM | POA: Insufficient documentation

## 2022-05-06 NOTE — Telephone Encounter (Signed)
Spoke with patient she is bringing form to be corrected to our office.

## 2022-05-07 ENCOUNTER — Encounter: Payer: Self-pay | Admitting: Family Medicine

## 2022-05-07 DIAGNOSIS — D485 Neoplasm of uncertain behavior of skin: Secondary | ICD-10-CM | POA: Diagnosis not present

## 2022-05-07 DIAGNOSIS — L308 Other specified dermatitis: Secondary | ICD-10-CM | POA: Diagnosis not present

## 2022-05-07 DIAGNOSIS — L820 Inflamed seborrheic keratosis: Secondary | ICD-10-CM | POA: Diagnosis not present

## 2022-05-07 DIAGNOSIS — Z1283 Encounter for screening for malignant neoplasm of skin: Secondary | ICD-10-CM | POA: Diagnosis not present

## 2022-05-07 DIAGNOSIS — L814 Other melanin hyperpigmentation: Secondary | ICD-10-CM | POA: Diagnosis not present

## 2022-05-07 DIAGNOSIS — D225 Melanocytic nevi of trunk: Secondary | ICD-10-CM | POA: Diagnosis not present

## 2022-05-08 ENCOUNTER — Telehealth: Payer: Self-pay | Admitting: Family Medicine

## 2022-05-08 ENCOUNTER — Encounter: Payer: Self-pay | Admitting: Family Medicine

## 2022-05-08 DIAGNOSIS — E876 Hypokalemia: Secondary | ICD-10-CM

## 2022-05-08 NOTE — Telephone Encounter (Signed)
Pt called stating she is calling back in regards to the [facility] paperwork. Wants to know if you can please call her when available?

## 2022-05-12 ENCOUNTER — Other Ambulatory Visit: Payer: Self-pay | Admitting: Family Medicine

## 2022-05-19 DIAGNOSIS — R202 Paresthesia of skin: Secondary | ICD-10-CM | POA: Diagnosis not present

## 2022-05-20 ENCOUNTER — Telehealth: Payer: Self-pay | Admitting: Family Medicine

## 2022-05-20 NOTE — Telephone Encounter (Signed)
[employer] Per patient spoke to Dr Moshe Cipro get forms filled out call patient when ready  Copied Noted Sleeved

## 2022-05-21 ENCOUNTER — Ambulatory Visit (HOSPITAL_COMMUNITY): Payer: BC Managed Care – PPO | Attending: Neurosurgery | Admitting: Physical Therapy

## 2022-05-21 ENCOUNTER — Encounter: Payer: Self-pay | Admitting: Family Medicine

## 2022-05-21 DIAGNOSIS — M6281 Muscle weakness (generalized): Secondary | ICD-10-CM | POA: Insufficient documentation

## 2022-05-21 DIAGNOSIS — M5412 Radiculopathy, cervical region: Secondary | ICD-10-CM | POA: Insufficient documentation

## 2022-05-21 NOTE — Therapy (Signed)
OUTPATIENT PHYSICAL THERAPY CERVICAL EVALUATION   Patient Name: [name] MRN: [id number] DOB:19-Nov-1970, 51 y.o., female Today's Date: 05/21/2022   PT End of Session - 05/21/22 900     Visit Number 1    Number of Visits 12    Date for PT Re-Evaluation 07/02/22    Authorization Type BCBS    PT Start Time 0820    PT Stop Time 0900    PT Time Calculation (min) 40 min    Activity Tolerance Patient tolerated treatment well    Behavior During Therapy [facility] for tasks assessed/performed             Past Medical History:  Diagnosis Date   Back pain    BV (bacterial vaginosis) 09/16/2013   Chest pain 2000   right    Contraceptive management 09/16/2013   Encephalocele ([place]) 1994   Enlarged thyroid    HSV-2 (herpes simplex virus 2) infection    Hypertension    Irregular intermenstrual bleeding 04/27/2015   Obesity    Preterm labor    Sleep disorder    Thyroid nodule    Urinary frequency 12/14/2014   Vaginal discharge 09/16/2013   Past Surgical History:  Procedure Laterality Date   BREAST BIOPSY  2007   btl  [place] N/A 05/06/2016   Procedure: DILATATION & CURETTAGE/HYSTEROSCOPY WITH NOVASURE ABLATION;  Surgeon: Jonnie Kind, MD;  Location: [facility];  Service: Gynecology;  Laterality: N/A;  length-5; width-2.6; power-72 ; time- 1 minute 42 seconds   LAPAROSCOPIC BILATERAL SALPINGECTOMY Bilateral 05/06/2016   Procedure: LAPAROSCOPIC BILATERAL SALPINGECTOMY;  Surgeon: Jonnie Kind, MD;  Location: [facility];  Service: Gynecology;  Laterality: Bilateral;   RETAINED PLACENTA REMOVAL     TUBAL LIGATION     04/2016   tubal ligation reversal  2001   WISDOM TOOTH EXTRACTION     Patient Active Problem List   Diagnosis Date Noted   Acute gastroenteritis 05/05/2022   Generalized pain 05/05/2022   Elevated rheumatoid factor 05/05/2022   Adjustment reaction with anxiety and depression 04/03/2022   Screen for STD (sexually  transmitted disease) 02/05/2022   Acute low back pain without sciatica 11/28/2021   Cervical radiculopathy 11/28/2021   Benign skin mole 03/07/2020   Callus of foot 03/07/2020   Chronic superficial venous thrombosis of lower extremity 06/05/2019   Allergic reaction 12/07/2018   Multiple allergies 11/28/2015   Prediabetes 08/01/2012   Overweight (BMI 25.0-29.9) 08/01/2012   Vitamin D deficiency 07/28/2012   Allergic rhinitis 10/16/2009   Neck pain 03/17/2009   HYPOKALEMIA 03/14/2008   THYROID NODULE 10/26/2007   Essential hypertension 10/26/2007   Back pain with radiation 10/26/2007    PCP: Tula Nakayama  REFERRING PROVIDER: Vallarie Mare, MD  REFERRING DIAG:  Diagnosis Description  PT eval/tx for [phone] cervical spondylosis w/o myelopathy    THERAPY DIAG:  cervicalalgia  Rationale for Evaluation and Treatment: Rehabilitation  ONSET DATE: chronic since 2010 with acute exacerbation   SUBJECTIVE:  SUBJECTIVE STATEMENT: [name] states that she has been having cervical pain for years.  Eight months ago she pushed a dresser which caused this flair up.  The pt states that the pain goes down both arms to her fingers.  She has noted that she has less strength in her hands and at night it aches so she toss and turns.  She is getting maybe 5 hours of sleep a night. She has noted increased weakness in her arms and grip strength.   She has been taken out of work since December 31st.    PERTINENT HISTORY:  Chronic back and neck pain, HTN  PAIN:  Are you having pain? Yes: Pain location: 4-5 more on the right goes as high as an 8 Pain description: aches  Aggravating factors: walking  Relieving factors: tylenol and heat   PRECAUTIONS: None  WEIGHT BEARING RESTRICTIONS:  No  FALLS:  Has patient fallen in last 6 months? 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 Findings are most notably as follows.   At C5-C6, there is progressive mild-to-moderate disc degeneration with minimal degenerative endplate edema. Progressive disc bulge and bilateral uncovertebral hypertrophy. Superimposed small central disc protrusion. The disc bulge and disc protrusion efface the ventral thecal sac, contacting and minimally flattening the ventral aspect of the spinal cord. However, the dorsal CSF space is maintained within the spinal canal. Progressive bilateral neural foraminal narrowing (mild right, mild-to-moderate left).   No significant spinal canal stenosis at the remaining levels. Foraminal stenosis on the left at C6-C7 (moderate/severe) and on the left at C3-C4 (moderate).     Electronically Signed   By: Kellie Simmering D.O.   On: 04/26/2022 12:08  PATIENT SURVEYS:  FOTO 46  COGNITION: Overall cognitive status: Within functional limits for tasks assessed  SENSATION: Not tested  POSTURE: No Significant postural limitations  PALPATION: Noted tightness and mm spasm in B trapezius    CERVICAL ROM:   Active ROM A/PROM (deg) eval  Flexion 30 reps   Extension 55 reps increase pain   Right lateral flexion 30  Left lateral flexion 20  Right rotation 45  Left rotation 40   (Blank rows = not tested) Cervical strength: Extension: 2- Rt side bend: 2- Lt side bend:2- Minimal exertion noted  UPPER EXTREMITY ROM: Functional with significant encouragement noted facial and verbal expression of pain.  Raising of arms is very slow which, in fact, would make  this activity more difficult.   UPPER EXTREMITY MMT:  MMT Right eval Left eval  Shoulder flexion 3 3  Shoulder extension    Shoulder abduction 3 3  Shoulder adduction    Shoulder extension    Shoulder internal rotation 3 3  Shoulder external rotation 3 3  Middle trapezius    Lower trapezius    Elbow flexion 4 4  Elbow extension 4 4  Wrist flexion    Wrist extension    Wrist ulnar deviation    Wrist radial deviation    Wrist pronation    Wrist supination    Grip strength 13 10   (Blank rows = not tested) All manual mm testing had a give way  Fast exchange grip strength is 35#, fast exchange should  be less than grip strength. TODAY'S TREATMENT:                                                                                                                              DATE: evaluation:    Seated Cervical Retraction  -3 - 3-5" hold - Seated Scapular Retraction  3 - 3-5" hold - Standing Cervical Retraction with Sidebending  3 - 10 reps - 3-5" hold - Seated Cervical Retraction and Rotation  3 - 3-5" hold - Seated Transversus Abdominis Bracing  3 x 5" PATIENT EDUCATION:  Education details: HEP and the importance of good posture Person educated: Patient Education method: Explanation Education comprehension: returned demonstration  HOME EXERCISE PROGRAM: Access Code: [email or web address] URL: [email or web address] Date: 05/21/2022 Prepared by: Rayetta Humphrey  Exercises - Seated Cervical Retraction  - 2 x daily - 7 x weekly - 1 sets - 10 reps - 3-5" hold - Seated Scapular Retraction  - 2 x daily - 7 x weekly - 1 sets - 10 reps - 3-5" hold - Standing Cervical Retraction with Sidebending  - 2 x daily - 7 x weekly - 1 sets - 10 reps - 3-5" hold - Seated Cervical Retraction and Rotation  - 2 x daily - 7 x weekly - 1 sets - 10 reps - 3-5" hold - Seated Transversus Abdominis Bracing  - 4 x daily - 7 x weekly - 1 sets - 10 reps - 3" hold  ASSESSMENT:  CLINICAL  IMPRESSION: Patient is a 51 y.o. female who was seen today for physical therapy evaluation and treatment for cervical spondylosis. Evaluation demonstrates decreased strength, decreased activity tolerance, decreased ROM, increased pain and increased mm spasm.   Ms. Montoro will benfit from skilled PT to address these issues and improve her functional ability.   OBJECTIVE IMPAIRMENTS: decreased activity tolerance, decreased ROM, decreased strength, increased fascial restrictions, increased muscle spasms, impaired UE functional use, and pain.   ACTIVITY LIMITATIONS: carrying, lifting, sitting, sleeping, and reach over head  PARTICIPATION LIMITATIONS: meal prep, cleaning, laundry, shopping, community activity, and occupation  PERSONAL FACTORS: Behavior pattern and Time since onset of injury/illness/exacerbation are also affecting patient's functional outcome.   REHAB POTENTIAL: Fair    CLINICAL DECISION MAKING: Evolving/moderate complexity  EVALUATION COMPLEXITY: Moderate   GOALS: Goals reviewed with patient? No  SHORT TERM GOALS: Target date: 06/11/2022   PT to be I in HEP to increase cervcial ROM to at least 60 for safer driving.  Baseline:  Goal status: INITIAL  2.  PT pain to be no greater than a 5/10  Baseline:  Goal status: INITIAL  3.  PT strength of UE to increase 1/2 grade so that pt is able to raise arms above her head in a normal velocity without pain.  Baseline:  Goal status: INITIAL    LONG TERM GOALS: Target date: 07/02/2022  PT to be I in an advanced stabilization exercise program to decrease pain to no greater than  a 3/10 Baseline:  Goal status: INITIAL  2.  PT UE strength to be increased 1 grade to allow improved functional use.  Baseline:  Goal status: INITIAL  3.  Hand grip to increase to fast exchange rate of  35# for functional use  Baseline:  Goal status: INITIAL  4.  ROM of cervical spine to be to 70 to allow full scanning of enviornment.  Goal  status: INITIAL     PLAN:  PT FREQUENCY: 2x/week  PT DURATION: 6 weeks  PLANNED INTERVENTIONS: Therapeutic exercises, Therapeutic activity, Patient/Family education, Self Care, and Manual therapy  PLAN FOR NEXT SESSION: begin cervical stabilization supine with UE motion, progress to sitting and standing.  Manual to decrease spasm, decrease pain and improve ROM.  Rayetta Humphrey, [place] CLT [phone]  9:12

## 2022-05-23 ENCOUNTER — Ambulatory Visit (HOSPITAL_COMMUNITY): Payer: BC Managed Care – PPO | Admitting: Physical Therapy

## 2022-05-23 ENCOUNTER — Encounter (HOSPITAL_COMMUNITY): Payer: Self-pay | Admitting: Physical Therapy

## 2022-05-23 ENCOUNTER — Telehealth: Payer: Self-pay | Admitting: Family Medicine

## 2022-05-23 DIAGNOSIS — M5412 Radiculopathy, cervical region: Secondary | ICD-10-CM | POA: Diagnosis not present

## 2022-05-23 DIAGNOSIS — M6281 Muscle weakness (generalized): Secondary | ICD-10-CM | POA: Diagnosis not present

## 2022-05-23 NOTE — Telephone Encounter (Signed)
FYI

## 2022-05-23 NOTE — Telephone Encounter (Signed)
Patient called said question # 8 needs to say yes.     And fax the forms out and call patient so patient can pick up a copy also.

## 2022-05-23 NOTE — Therapy (Signed)
OUTPATIENT PHYSICAL THERAPY CERVICAL Treatment   Patient Name: [name] MRN: [id number] DOB:December 15, 1970, 51 y.o., female Today's Date: 05/23/2022   PT End of Session - 05/21/22 900     Visit Number 1    Number of Visits 12    Date for PT Re-Evaluation 07/02/22    Authorization Type [employer]    PT Start Time 0820    PT Stop Time 0900    PT Time Calculation (min) 40 min    Activity Tolerance Patient tolerated treatment well    Behavior During Therapy [facility] for tasks assessed/performed             Past Medical History:  Diagnosis Date   Back pain    BV (bacterial vaginosis) 09/16/2013   Chest pain 2000   right    Contraceptive management 09/16/2013   Encephalocele ([place]) 1994   Enlarged thyroid    HSV-2 (herpes simplex virus 2) infection    Hypertension    Irregular intermenstrual bleeding 04/27/2015   Obesity    Preterm labor    Sleep disorder    Thyroid nodule    Urinary frequency 12/14/2014   Vaginal discharge 09/16/2013   Past Surgical History:  Procedure Laterality Date   BREAST BIOPSY  2007   btl  [place] N/A 05/06/2016   Procedure: DILATATION & CURETTAGE/HYSTEROSCOPY WITH NOVASURE ABLATION;  Surgeon: Jonnie Kind, MD;  Location: [facility];  Service: Gynecology;  Laterality: N/A;  length-5; width-2.6; power-72 ; time- 1 minute 42 seconds   LAPAROSCOPIC BILATERAL SALPINGECTOMY Bilateral 05/06/2016   Procedure: LAPAROSCOPIC BILATERAL SALPINGECTOMY;  Surgeon: Jonnie Kind, MD;  Location: [facility];  Service: Gynecology;  Laterality: Bilateral;   RETAINED PLACENTA REMOVAL     TUBAL LIGATION     04/2016   tubal ligation reversal  2001   WISDOM TOOTH EXTRACTION     Patient Active Problem List   Diagnosis Date Noted   Acute gastroenteritis 05/05/2022   Generalized pain 05/05/2022   Elevated rheumatoid factor 05/05/2022   Adjustment reaction with anxiety and depression 04/03/2022   Screen for STD (sexually  transmitted disease) 02/05/2022   Acute low back pain without sciatica 11/28/2021   Cervical radiculopathy 11/28/2021   Benign skin mole 03/07/2020   Callus of foot 03/07/2020   Chronic superficial venous thrombosis of lower extremity 06/05/2019   Allergic reaction 12/07/2018   Multiple allergies 11/28/2015   Prediabetes 08/01/2012   Overweight (BMI 25.0-29.9) 08/01/2012   Vitamin D deficiency 07/28/2012   Allergic rhinitis 10/16/2009   Neck pain 03/17/2009   HYPOKALEMIA 03/14/2008   THYROID NODULE 10/26/2007   Essential hypertension 10/26/2007   Back pain with radiation 10/26/2007    PCP: Tula Nakayama  REFERRING PROVIDER: Vallarie Mare, MD  REFERRING DIAG:  Diagnosis Description  PT eval/tx for [phone] cervical spondylosis w/o myelopathy    THERAPY DIAG:  cervicalalgia  Rationale for Evaluation and Treatment: Rehabilitation  ONSET DATE: chronic since 2010 with acute exacerbation   SUBJECTIVE:  SUBJECTIVE STATEMENT: Reports a fair amount of soreness the first day after performing HEP however noticed improved symptoms in the evening when she is often more bothered by symptoms. States she received an informal phone call about her NVC test and that it appears she has fibromyalgia, but has not spoken with her ordering physician yet.  PERTINENT HISTORY:  Chronic back and neck pain, HTN  PAIN:  Are you having pain? Yes: Pain location: 5 left side of neck, into Rt upper trap Pain description: aches  Aggravating factors: walking  Relieving factors: tylenol and heat   PRECAUTIONS: None  WEIGHT BEARING RESTRICTIONS: No  FALLS:  Has patient fallen in last 6 months? No  LIVING ENVIRONMENT: Lives with: lives with their family OCCUPATION:  sedentary on the computer all  day long  PLOF: Independent  PATIENT GOALS: less pain  NEXT MD VISIT: January  OBJECTIVE:   DIAGNOSTIC FINDINGS:  IMPRESSION: Cervical spondylosis, as outlined and having progressed at multiple levels since the prior MRI of 10/18/2004. Findings are most notably as follows.   At C5-C6, there is progressive mild-to-moderate disc degeneration with minimal degenerative endplate edema. Progressive disc bulge and bilateral uncovertebral hypertrophy. Superimposed small central disc protrusion. The disc bulge and disc protrusion efface the ventral thecal sac, contacting and minimally flattening the ventral aspect of the spinal cord. However, the dorsal CSF space is maintained within the spinal canal. Progressive bilateral neural foraminal narrowing (mild right, mild-to-moderate left).   No significant spinal canal stenosis at the remaining levels. Foraminal stenosis on the left at C6-C7 (moderate/severe) and on the left at C3-C4 (moderate).     Electronically Signed   By: Kellie Simmering D.O.   On: 04/26/2022 12:08  PATIENT SURVEYS:  FOTO 46  COGNITION: Overall cognitive status: Within functional limits for tasks assessed  SENSATION: Not tested  POSTURE: No Significant postural limitations  PALPATION: Noted tightness and mm spasm in B trapezius    CERVICAL ROM:   Active ROM A/PROM (deg) eval  Flexion 30 reps   Extension 55 reps increase pain   Right lateral flexion 30  Left lateral flexion 20  Right rotation 45  Left rotation 40   (Blank rows = not tested) Cervical strength: Extension: 2- Rt side bend: 2- Lt side bend:2- Minimal exertion noted  UPPER EXTREMITY ROM: Functional with significant encouragement noted facial and verbal expression of pain.  Raising of arms is very slow which, in fact, would make this activity more difficult.   UPPER EXTREMITY MMT:  MMT Right eval Left eval  Shoulder flexion 3 3  Shoulder extension    Shoulder abduction 3 3   Shoulder adduction    Shoulder extension    Shoulder internal rotation 3 3  Shoulder external rotation 3 3  Middle trapezius    Lower trapezius    Elbow flexion 4 4  Elbow extension 4 4  Wrist flexion    Wrist extension    Wrist ulnar deviation    Wrist radial deviation    Wrist pronation    Wrist supination    Grip strength 13 10   (Blank rows = not tested) All manual mm testing had a give way  Fast exchange grip strength is 35#, fast exchange should be less than grip strength. TODAY'S TREATMENT:  DATE:  05/23/22 Reviewed HEP and goals Seated thoracic extension x10 Seated bil RTB ER x10   evaluation:    Seated Cervical Retraction  -3 - 3-5" hold - Seated Scapular Retraction  3 - 3-5" hold - Standing Cervical Retraction with Sidebending  3 - 10 reps - 3-5" hold - Seated Cervical Retraction and Rotation  3 - 3-5" hold - Seated Transversus Abdominis Bracing  3 x 5" PATIENT EDUCATION:  Education details: HEP and the importance of good posture Person educated: Patient Education method: Explanation Education comprehension: returned demonstration  HOME EXERCISE PROGRAM: Access Code: [email or web address] URL: [email or web address] Date: 05/21/2022 Prepared by: Rayetta Humphrey  Exercises - Seated Cervical Retraction  - 2 x daily - 7 x weekly - 1 sets - 10 reps - 3-5" hold - Seated Scapular Retraction  - 2 x daily - 7 x weekly - 1 sets - 10 reps - 3-5" hold - Standing Cervical Retraction with Sidebending  - 2 x daily - 7 x weekly - 1 sets - 10 reps - 3-5" hold - Seated Cervical Retraction and Rotation  - 2 x daily - 7 x weekly - 1 sets - 10 reps - 3-5" hold - Seated Transversus Abdominis Bracing  - 4 x daily - 7 x weekly - 1 sets - 10 reps - 3" hold  ASSESSMENT:  CLINICAL IMPRESSION: Required a lot of cues and education for symptom awareness and  lower intensity with exercises to minimize painful symptoms. Limited duration due to late check-in today. Added thoracic mobility, and gentle RTB for postural and UQ training. Tolerated these well. Verbalizes understanding of HEP and modification for comfort. All questions answered. OBJECTIVE IMPAIRMENTS: decreased activity tolerance, decreased ROM, decreased strength, increased fascial restrictions, increased muscle spasms, impaired UE functional use, and pain.   ACTIVITY LIMITATIONS: carrying, lifting, sitting, sleeping, and reach over head  PARTICIPATION LIMITATIONS: meal prep, cleaning, laundry, shopping, community activity, and occupation  PERSONAL FACTORS: Behavior pattern and Time since onset of injury/illness/exacerbation are also affecting patient's functional outcome.   REHAB POTENTIAL: Fair    CLINICAL DECISION MAKING: Evolving/moderate complexity  EVALUATION COMPLEXITY: Moderate   GOALS: Goals reviewed with patient? No  SHORT TERM GOALS: Target date: 06/13/2022   PT to be I in HEP to increase cervcial ROM to at least 60 for safer driving.  Baseline:  Goal status: INITIAL  2.  PT pain to be no greater than a 5/10  Baseline:  Goal status: INITIAL  3.  PT strength of UE to increase 1/2 grade so that pt is able to raise arms above her head in a normal velocity without pain.  Baseline:  Goal status: INITIAL    LONG TERM GOALS: Target date: 07/04/2022  PT to be I in an advanced stabilization exercise program to decrease pain to no greater than a 3/10 Baseline:  Goal status: INITIAL  2.  PT UE strength to be increased 1 grade to allow improved functional use.  Baseline:  Goal status: INITIAL  3.  Hand grip to increase to fast exchange rate of  35# for functional use  Baseline:  Goal status: INITIAL  4.  ROM of cervical spine to be to 70 to allow full scanning of enviornment.  Goal status: INITIAL     PLAN:  PT FREQUENCY: 2x/week  PT DURATION: 6  weeks  PLANNED INTERVENTIONS: Therapeutic exercises, Therapeutic activity, Patient/Family education, Self Care, and Manual therapy  PLAN FOR NEXT SESSION: begin cervical stabilization supine with UE motion, progress to sitting and  standing.  Manual to decrease spasm, decrease pain and improve ROM.   Candie Mile, PT, DPT Physical Therapist [facility] [place] [facility]

## 2022-05-26 ENCOUNTER — Encounter: Payer: Self-pay | Admitting: Family Medicine

## 2022-05-26 DIAGNOSIS — Z0279 Encounter for issue of other medical certificate: Secondary | ICD-10-CM

## 2022-05-27 ENCOUNTER — Ambulatory Visit (HOSPITAL_COMMUNITY): Payer: BC Managed Care – PPO | Admitting: Physical Therapy

## 2022-05-27 DIAGNOSIS — M6281 Muscle weakness (generalized): Secondary | ICD-10-CM | POA: Diagnosis not present

## 2022-05-27 DIAGNOSIS — M5412 Radiculopathy, cervical region: Secondary | ICD-10-CM

## 2022-05-27 NOTE — Therapy (Signed)
OUTPATIENT PHYSICAL THERAPY CERVICAL Treatment   Patient Name: [name] MRN: [id number] DOB:[birth year], 51 y.o., female Today's Date: 05/27/2022   END OF SESSION:   PT End of Session - 05/27/22 1525     Visit Number 3    Number of Visits 12    Date for PT Re-Evaluation 07/02/22    Authorization Type [employer]    Progress Note Due on Visit 10    PT Start Time 1450    PT Stop Time 1519    PT Time Calculation (min) 29 min    Activity Tolerance Patient tolerated treatment well    Behavior During Therapy [facility] for tasks assessed/performed                     Past Medical History:  Diagnosis Date   Back pain    BV (bacterial vaginosis) 09/16/2013   Chest pain 2000   right    Contraceptive management 09/16/2013   Encephalocele ([place]) 1994   Enlarged thyroid    HSV-2 (herpes simplex virus 2) infection    Hypertension    Irregular intermenstrual bleeding 04/27/2015   Obesity    Preterm labor    Sleep disorder    Thyroid nodule    Urinary frequency 12/14/2014   Vaginal discharge 09/16/2013   Past Surgical History:  Procedure Laterality Date   BREAST BIOPSY  2007   btl  [place] N/A 05/06/2016   Procedure: DILATATION & CURETTAGE/HYSTEROSCOPY WITH NOVASURE ABLATION;  Surgeon: Jonnie Kind, MD;  Location: [facility];  Service: Gynecology;  Laterality: N/A;  length-5; width-2.6; power-72 ; time- 1 minute 42 seconds   LAPAROSCOPIC BILATERAL SALPINGECTOMY Bilateral 05/06/2016   Procedure: LAPAROSCOPIC BILATERAL SALPINGECTOMY;  Surgeon: Jonnie Kind, MD;  Location: [facility];  Service: Gynecology;  Laterality: Bilateral;   RETAINED PLACENTA REMOVAL     TUBAL LIGATION     04/2016   tubal ligation reversal  2001   WISDOM TOOTH EXTRACTION     Patient Active Problem List   Diagnosis Date Noted   Acute gastroenteritis 05/05/2022   Generalized pain 05/05/2022   Elevated rheumatoid factor 05/05/2022   Adjustment reaction  with anxiety and depression 04/03/2022   Screen for STD (sexually transmitted disease) 02/05/2022   Acute low back pain without sciatica 11/28/2021   Cervical radiculopathy 11/28/2021   Benign skin mole 03/07/2020   Callus of foot 03/07/2020   Chronic superficial venous thrombosis of lower extremity 06/05/2019   Allergic reaction 12/07/2018   Multiple allergies 11/28/2015   Prediabetes 08/01/2012   Overweight (BMI 25.0-29.9) 08/01/2012   Vitamin D deficiency 07/28/2012   Allergic rhinitis 10/16/2009   Neck pain 03/17/2009   HYPOKALEMIA 03/14/2008   THYROID NODULE 10/26/2007   Essential hypertension 10/26/2007   Back pain with radiation 10/26/2007    PCP: Tula Nakayama  REFERRING PROVIDER: Vallarie Mare, MD  REFERRING DIAG:  Diagnosis Description  PT eval/tx for [phone] cervical spondylosis w/o myelopathy    THERAPY DIAG:  cervicalalgia  Rationale for Evaluation and Treatment: Rehabilitation  ONSET DATE: chronic since 2010 with acute exacerbation   SUBJECTIVE:  SUBJECTIVE STATEMENT: Pt checked in late; Pt reports compliance with HEP but having pain in her back.  Reports after she had the pain in her back she hasn't done any exercises since.  Reports she has not heard anything about the nerve conduction study yet but goes on December 4th and 7th.   PERTINENT HISTORY:  Chronic back and neck pain, HTN  PAIN:  Are you having pain? Yes: Pain location: 5 left side of neck, into Rt upper trap Pain description: aches  Aggravating factors: walking  Relieving factors: tylenol and heat   PRECAUTIONS: None  WEIGHT BEARING RESTRICTIONS: No  FALLS:  Has patient fallen in last 6 months? 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 Findings are most notably as follows.   At C5-C6, there is progressive mild-to-moderate disc degeneration with minimal degenerative endplate edema. Progressive disc bulge and bilateral uncovertebral hypertrophy. Superimposed small central disc protrusion. The disc bulge and disc protrusion efface the ventral thecal sac, contacting and minimally flattening the ventral aspect of the spinal cord. However, the dorsal CSF space is maintained within the spinal canal. Progressive bilateral neural foraminal narrowing (mild right, mild-to-moderate left).   No significant spinal canal stenosis at the remaining levels. Foraminal stenosis on the left at C6-C7 (moderate/severe) and on the left at C3-C4 (moderate).     Electronically Signed   By: Kellie Simmering D.O.   On: 04/26/2022 12:08  PATIENT SURVEYS:  FOTO 46  COGNITION: Overall cognitive status: Within functional limits for tasks assessed  SENSATION: Not tested  POSTURE: No Significant postural limitations  PALPATION: Noted tightness and mm spasm in B trapezius    CERVICAL ROM:   Active ROM A/PROM (deg) eval  Flexion 30 reps   Extension 55 reps increase pain   Right lateral flexion 30  Left lateral flexion 20  Right rotation 45  Left rotation 40   (Blank rows = not tested) Cervical strength: Extension: 2- Rt side bend: 2- Lt side bend:2- Minimal exertion noted  UPPER EXTREMITY ROM: Functional with significant encouragement noted facial and verbal expression of pain.  Raising of arms is very slow which, in fact, would make this activity more difficult.   UPPER EXTREMITY MMT:  MMT Right eval Left eval  Shoulder flexion 3 3  Shoulder extension    Shoulder  abduction 3 3  Shoulder adduction    Shoulder extension    Shoulder internal rotation 3 3  Shoulder external rotation 3 3  Middle trapezius    Lower trapezius    Elbow flexion 4 4  Elbow extension 4 4  Wrist flexion    Wrist extension    Wrist ulnar deviation    Wrist radial deviation    Wrist pronation    Wrist supination    Grip strength 13 10   (Blank rows = not tested) All manual mm testing had a give way  Fast exchange grip strength is 35#, fast exchange should be less than grip strength. TODAY'S TREATMENT:  DATE:  05/27/22 Seated:  scapular retractions 10X  3D Thoracic excursions with UE movements 5X each  3D cervical excursions 5X each  05/23/22 Reviewed HEP and goals Seated thoracic extension x10 Seated bil RTB ER x10   evaluation:    Seated Cervical Retraction  -3 - 3-5" hold - Seated Scapular Retraction  3 - 3-5" hold - Standing Cervical Retraction with Sidebending  3 - 10 reps - 3-5" hold - Seated Cervical Retraction and Rotation  3 - 3-5" hold - Seated Transversus Abdominis Bracing  3 x 5" PATIENT EDUCATION:  Education details: HEP and the importance of good posture Person educated: Patient Education method: Explanation Education comprehension: returned demonstration  HOME EXERCISE PROGRAM: Access Code: [email or web address] URL: [email or web address] Date: 05/21/2022 Prepared by: Rayetta Humphrey  Exercises - Seated Cervical Retraction  - 2 x daily - 7 x weekly - 1 sets - 10 reps - 3-5" hold - Seated Scapular Retraction  - 2 x daily - 7 x weekly - 1 sets - 10 reps - 3-5" hold - Standing Cervical Retraction with Sidebending  - 2 x daily - 7 x weekly - 1 sets - 10 reps - 3-5" hold - Seated Cervical Retraction and Rotation  - 2 x daily - 7 x weekly - 1 sets - 10 reps - 3-5" hold - Seated Transversus Abdominis Bracing  - 4 x daily  - 7 x weekly - 1 sets - 10 reps - 3" hold  ASSESSMENT:  CLINICAL IMPRESSION: Pt checked in late so unable to get full treatment today.  Focused on cervical and thoracic mobility this session.  Pt with limited cervical extension. Educated on importance of completing therex and only going into before the point of pain.  Continued  cues and education for symptom awareness and lower intensity with exercises to minimize painful symptoms. Cues to stabilize and isolate mm rather than letting bodily substitutions occur.  Progressed to standing postural strengthening using  RTB.  Noted weakness and generalized cues for correct form.  PT will continue to benefit from skilled therapy to reduce pain and improve overall functional tolerance.  OBJECTIVE IMPAIRMENTS: decreased activity tolerance, decreased ROM, decreased strength, increased fascial restrictions, increased muscle spasms, impaired UE functional use, and pain.   ACTIVITY LIMITATIONS: carrying, lifting, sitting, sleeping, and reach over head  PARTICIPATION LIMITATIONS: meal prep, cleaning, laundry, shopping, community activity, and occupation  PERSONAL FACTORS: Behavior pattern and Time since onset of injury/illness/exacerbation are also affecting patient's functional outcome.   REHAB POTENTIAL: Fair    CLINICAL DECISION MAKING: Evolving/moderate complexity  EVALUATION COMPLEXITY: Moderate   GOALS: Goals reviewed with patient? No  SHORT TERM GOALS: Target date: 06/17/2022   PT to be I in HEP to increase cervcial ROM to at least 60 for safer driving.  Baseline:  Goal status: INITIAL  2.  PT pain to be no greater than a 5/10  Baseline:  Goal status: INITIAL  3.  PT strength of UE to increase 1/2 grade so that pt is able to raise arms above her head in a normal velocity without pain.  Baseline:  Goal status: INITIAL    LONG TERM GOALS: Target date: 07/08/2022  PT to be I in an advanced stabilization exercise program to decrease  pain to no greater than a 3/10 Baseline:  Goal status: INITIAL  2.  PT UE strength to be increased 1 grade to allow improved functional use.  Baseline:  Goal status: INITIAL  3.  Hand grip to increase to fast exchange  rate of  35# for functional use  Baseline:  Goal status: INITIAL  4.  ROM of cervical spine to be to 70 to allow full scanning of enviornment.  Goal status: INITIAL     PLAN:  PT FREQUENCY: 2x/week  PT DURATION: 6 weeks  PLANNED INTERVENTIONS: Therapeutic exercises, Therapeutic activity, Patient/Family education, Self Care, and Manual therapy  PLAN FOR NEXT SESSION: begin cervical stabilization supine with UE motion, progress to sitting and standing.  Manual to decrease spasm, decrease pain and improve ROM.   Teena Irani, PTA/CLT [place] Ph: [phone]  Teena Irani, PTA 05/27/2022, 3:25 PM

## 2022-06-02 ENCOUNTER — Encounter: Payer: Self-pay | Admitting: Internal Medicine

## 2022-06-02 ENCOUNTER — Ambulatory Visit (INDEPENDENT_AMBULATORY_CARE_PROVIDER_SITE_OTHER): Payer: BC Managed Care – PPO | Admitting: Internal Medicine

## 2022-06-02 DIAGNOSIS — J069 Acute upper respiratory infection, unspecified: Secondary | ICD-10-CM | POA: Diagnosis not present

## 2022-06-02 LAB — POCT INFLUENZA A/B
Influenza A, POC: NEGATIVE
Influenza B, POC: NEGATIVE

## 2022-06-02 MED ORDER — NOREL AD 4-10-325 MG PO TABS: 1.0000 | ORAL_TABLET | Freq: Three times a day (TID) | ORAL | 0 refills | Status: DC | PRN

## 2022-06-02 NOTE — Progress Notes (Signed)
Virtual Visit via Telephone Note   This visit type was conducted via telephone. This format is felt to be most appropriate for this patient at this time.  The patient did not have access to video technology/had technical difficulties with video requiring transitioning to audio format only (telephone).  All issues noted in this document were discussed and addressed.  No physical exam could be performed with this format.  Evaluation Performed:  Follow-up visit  Date:  06/02/2022   ID:  [name], DOB 1971-03-24, MRN [id number]  Patient Location: Home Provider Location: Office/Clinic  Participants: Patient Location of Patient: Home Location of Provider: Telehealth Consent was obtain for visit to be over via telehealth. I verified that I am speaking with the correct person using two identifiers.  PCP:  Fayrene Helper, MD   Chief Complaint: Nasal congestion, body aches and chills  History of Present Illness:    [name] is a 51 y.o. female who has a televisit for complaint of nasal congestion, body aches, chills and vomiting since last night.  She denies any recent sick contacts.  She has not had flu vaccine yet.  Denies any dyspnea or wheezing currently.  She tried OTC cough syrup without much relief.  The patient does have symptoms concerning for COVID-19 infection (fever, chills, cough, or new shortness of breath).   Past Medical, Surgical, Social History, Allergies, and Medications have been Reviewed.  Past Medical History:  Diagnosis Date   Back pain    BV (bacterial vaginosis) 09/16/2013   Chest pain 2000   right    Contraceptive management 09/16/2013   Encephalocele ([place]) 1994   Enlarged thyroid    HSV-2 (herpes simplex virus 2) infection    Hypertension    Irregular intermenstrual bleeding 04/27/2015   Obesity    Preterm labor    Sleep disorder    Thyroid nodule    Urinary frequency 12/14/2014   Vaginal discharge 09/16/2013   Past Surgical History:   Procedure Laterality Date   BREAST BIOPSY  2007   btl  [place] N/A 05/06/2016   Procedure: DILATATION & CURETTAGE/HYSTEROSCOPY WITH NOVASURE ABLATION;  Surgeon: Jonnie Kind, MD;  Location: [facility];  Service: Gynecology;  Laterality: N/A;  length-5; width-2.6; power-72 ; time- 1 minute 42 seconds   LAPAROSCOPIC BILATERAL SALPINGECTOMY Bilateral 05/06/2016   Procedure: LAPAROSCOPIC BILATERAL SALPINGECTOMY;  Surgeon: Jonnie Kind, MD;  Location: [facility];  Service: Gynecology;  Laterality: Bilateral;   RETAINED PLACENTA REMOVAL     TUBAL LIGATION     04/2016   tubal ligation reversal  2001   WISDOM TOOTH EXTRACTION       Current Meds  Medication Sig   cetirizine HCl (ZYRTEC) 1 MG/ML solution TAKE 1/2 TO 1 TEASPOON BY MOUTH DAILY AS , NEEDED, FOR ALLERGIES (OTC)   diltiazem (CARDIZEM CD) 120 MG 24 hr capsule Take 1 capsule (120 mg total) by mouth daily.   diphenoxylate-atropine (LOMOTIL) 2.5-0.025 MG tablet Take 1 tablet by mouth 4 (four) times daily as needed for diarrhea or loose stools.   ergocalciferol (VITAMIN D2) 1.25 MG (50000 UT) capsule Take 1 capsule (50,000 Units total) by mouth once a week. One capsule once weekly   gabapentin (NEURONTIN) 100 MG capsule Take one to two capsules by mouth at bedtime   hydrochlorothiazide (HYDRODIURIL) 25 MG tablet Take 1 tablet (25 mg total) by mouth daily.   hydrOXYzine (VISTARIL) 25 MG capsule Take 1 capsule (  25 mg total) by mouth every 8 (eight) hours as needed.   metoprolol tartrate (LOPRESSOR) 25 MG tablet TAKE 1/2 TABLET BY MOUTH TWICE DAILY   ondansetron (ZOFRAN) 4 MG tablet Take 1 tablet (4 mg total) by mouth every 8 (eight) hours as needed for nausea or vomiting.   potassium chloride (KLOR-CON 10) 10 MEQ tablet Take four tablets by mouth two times daily     Allergies:   Biaxin [clarithromycin], Iodinated contrast media, Depo-medrol [methylprednisolone], Shellfish allergy,  Yellow jacket venom [bee venom], and Peanut-containing drug products   ROS:   Please see the history of present illness.     All other systems reviewed and are negative.   Labs/Other Tests and Data Reviewed:    Recent Labs: 09/10/2021: ALT 11; Hemoglobin 12.7; Platelets 350; TSH 1.480 05/02/2022: BUN 13; Creatinine, Ser 1.09; Potassium 2.8; Sodium 141   Recent Lipid Panel Lab Results  Component Value Date/Time   CHOL 154 09/10/2021 01:35 PM   TRIG 90 09/10/2021 01:35 PM   HDL 39 (L) 09/10/2021 01:35 PM   CHOLHDL 3.9 09/10/2021 01:35 PM   CHOLHDL 4.1 12/27/2019 07:37 AM   LDLCALC 98 09/10/2021 01:35 PM   LDLCALC 95 12/27/2019 07:37 AM    Wt Readings from Last 3 Encounters:  04/21/22 175 lb 1.3 oz (79.4 kg)  04/11/22 175 lb (79.4 kg)  04/02/22 175 lb (79.4 kg)     ASSESSMENT & PLAN:    URTI Check rapid flu and COVID RT-PCR Norel as needed for nasal congestion Nasal saline spray as needed for nasal congestion Advised to maintain adequate hydration  Time:   Today, I have spent 9 minutes reviewing the chart, including problem list, medications, and with the patient with telehealth technology discussing the above problems.   Medication Adjustments/Labs and Tests Ordered: Current medicines are reviewed at length with the patient today.  Concerns regarding medicines are outlined above.   Tests Ordered: No orders of the defined types were placed in this encounter.   Medication Changes: No orders of the defined types were placed in this encounter.    Note: This dictation was prepared with Dragon dictation along with smaller phrase technology. Similar sounding words can be transcribed inadequately or may not be corrected upon review. Any transcriptional errors that result from this process are unintentional.      Disposition:  Follow up  Signed, Lindell Spar, MD  06/02/2022 1:16 PM     [facility]

## 2022-06-02 NOTE — Addendum Note (Signed)
Addended by: Quentin Angst on: 06/02/2022 03:52 PM   Modules accepted: Orders

## 2022-06-02 NOTE — Patient Instructions (Signed)
Please come to office for flu and COVID testing.  Please take Norel as needed for nasal congestion.

## 2022-06-03 ENCOUNTER — Encounter (HOSPITAL_COMMUNITY): Payer: BC Managed Care – PPO | Admitting: Physical Therapy

## 2022-06-03 NOTE — Progress Notes (Unsigned)
Office Visit Note  Patient: [name]             Date of Birth: [birth year]           MRN: [id number]             PCP: Fayrene Helper, MD Referring: Fayrene Helper, MD Visit Date: 06/12/2022 Occupation: _0 @  Subjective:  Generalized pain   History of Present Illness: [name] is a 51 y.o. female who presents today for a new patient consultation per request of her PCP.  About 2 years ago she woke up with acute total body pain to the point she had difficulty getting out of bed.  She had a recurrence of total body pain about 5 months.  Since then she has continued to have generalized aching on daily basis.  Her pain has been most severe in her neck, lower back, both hands, both knees, and both feet.  She has noticed intermittent swelling in her hands, feet, and left knee.  According to the patient she was initially evaluated by her PCP who ordered x-rays of the C-spine, thoracic spine, and lumbar spine.  She was then referred to orthopedics who recommended evaluation by neurosurgery.  She had MRI of the C-spine and lumbar spine for further evaluation.  She also underwent an NCV with EMG.  According to the patient she was told that her discomfort is due to underlying arthritis and fibromyalgia.  She has started physical therapy for management of her neck and lower back pain.  Patient reports her pain is a 5-6 out of 10 on a daily basis.  She experiences muscle fatigue and muscle tenderness on a daily basis.  She has been "flaring" twice a week at which time she has difficulty getting out of bed due to the fatigue and generalized pain.  She attributes most of her fatigue secondary to insomnia from nocturnal pain at night.  She uses ice as well as a heating pad for pain relief.   Patient takes Tylenol very sparingly due to experiencing drowsiness.  She cannot tolerate taking NSAIDs due to diarrhea.  She has generalized itching if she takes prednisone, uses topical steroids, or has a   Depo-Medrol injection. She denies any known family history of autoimmune disease.  She denies any oral or nasal ulcerations.  She denies any sicca symptoms.  She has not noticed any Raynaud's phenomenon.  She denies any recent rashes or alopecia. She works from home and is right-handed.     Activities of Daily Living:  Patient reports morning stiffness for 3 hours.   Patient Reports nocturnal pain.  Difficulty dressing/grooming: Reports Difficulty climbing stairs: Reports Difficulty getting out of chair: Reports Difficulty using hands for taps, buttons, cutlery, and/or writing: Reports  Review of Systems  Constitutional:  Positive for fatigue.  HENT:  Negative for mouth sores and mouth dryness.   Eyes:  Negative for dryness.  Respiratory:  Negative for shortness of breath.   Cardiovascular:  Negative for chest pain and palpitations.  Gastrointestinal:  Negative for blood in stool, constipation and diarrhea.  Endocrine: Negative for increased urination.  Genitourinary:  Negative for involuntary urination.  Musculoskeletal:  Positive for joint pain, gait problem, joint pain, joint swelling, myalgias, muscle weakness, morning stiffness, muscle tenderness and myalgias.  Skin:  Negative for color change, rash, hair loss and sensitivity to sunlight.  Allergic/Immunologic: Negative for susceptible to infections.  Neurological:  Negative for dizziness and headaches.  Hematological:  Negative for swollen  glands.  Psychiatric/Behavioral:  Positive for sleep disturbance. Negative for depressed mood. The patient is not nervous/anxious.     PMFS History:  Patient Active Problem List   Diagnosis Date Noted   Acute gastroenteritis 05/05/2022   Generalized pain 05/05/2022   Elevated rheumatoid factor 05/05/2022   Adjustment reaction with anxiety and depression 04/03/2022   Screen for STD (sexually transmitted disease) 02/05/2022   Acute low back pain without sciatica 11/28/2021   Cervical  radiculopathy 11/28/2021   Benign skin mole 03/07/2020   Callus of foot 03/07/2020   Chronic superficial venous thrombosis of lower extremity 06/05/2019   Allergic reaction 12/07/2018   Multiple allergies 11/28/2015   Prediabetes 08/01/2012   Overweight (BMI 25.0-29.9) 08/01/2012   Vitamin D deficiency 07/28/2012   Allergic rhinitis 10/16/2009   Neck pain 03/17/2009   HYPOKALEMIA 03/14/2008   THYROID NODULE 10/26/2007   Essential hypertension 10/26/2007   Back pain with radiation 10/26/2007    Past Medical History:  Diagnosis Date   Back pain    BV (bacterial vaginosis) 09/16/2013   Chest pain 2000   right    Contraceptive management 09/16/2013   Encephalocele ([place]) 1994   Enlarged thyroid    HSV-2 (herpes simplex virus 2) infection    Hypertension    Irregular intermenstrual bleeding 04/27/2015   Obesity    Preterm labor    Sleep disorder    Thyroid nodule    Urinary frequency 12/14/2014   Vaginal discharge 09/16/2013    Family History  Problem Relation Age of Onset   Anuerysm Mother    Stroke Mother    Cancer Father        thyroid    Hypertension Father    Cancer Paternal Grandmother        breast   Seizures Maternal Grandmother    Past Surgical History:  Procedure Laterality Date   BREAST BIOPSY  2007   btl  [place] N/A 05/06/2016   Procedure: DILATATION & CURETTAGE/HYSTEROSCOPY WITH NOVASURE ABLATION;  Surgeon: Jonnie Kind, MD;  Location: [facility];  Service: Gynecology;  Laterality: N/A;  length-5; width-2.6; power-72 ; time- 1 minute 42 seconds   LAPAROSCOPIC BILATERAL SALPINGECTOMY Bilateral 05/06/2016   Procedure: LAPAROSCOPIC BILATERAL SALPINGECTOMY;  Surgeon: Jonnie Kind, MD;  Location: [facility];  Service: Gynecology;  Laterality: Bilateral;   RETAINED PLACENTA REMOVAL     TUBAL LIGATION     04/2016   tubal ligation reversal  2001   WISDOM TOOTH EXTRACTION     Social History   Social  History Narrative   Not on file   Immunization History  Administered Date(s) Administered   Influenza,inj,Quad PF,6+ Mos 08/31/2014   PFIZER(Purple Top)SARS-COV-2 Vaccination 02/18/2020   Td 12/18/2010   Tdap 07/07/2002     Objective: Vital Signs: BP 136/86 (BP Location: Right Arm, Patient Position: Sitting, Cuff Size: Normal)   Pulse 66   Resp 15   Ht _0  (1.676 m)   Wt 173 lb (78.5 kg)   BMI 27.92 kg/m    Physical Exam Vitals and nursing note reviewed.  Constitutional:      Appearance: She is well-developed.  HENT:     Head: Normocephalic and atraumatic.  Eyes:     Conjunctiva/sclera: Conjunctivae normal.  Cardiovascular:     Rate and Rhythm: Normal rate and regular rhythm.     Heart sounds: Normal heart sounds.  Pulmonary:     Effort: Pulmonary effort is normal.  Breath sounds: Normal breath sounds.  Abdominal:     General: Bowel sounds are normal.     Palpations: Abdomen is soft.  Musculoskeletal:     Cervical back: Normal range of motion.  Skin:    General: Skin is warm and dry.     Capillary Refill: Capillary refill takes less than 2 seconds.  Neurological:     Mental Status: She is alert and oriented to person, place, and time.  Psychiatric:        Behavior: Behavior normal.      Musculoskeletal Exam: Generalized hyperalgesia and positive tender points on exam.  C-spine has limited range of motion with lateral rotation.  Trapezius muscle tension and tenderness bilaterally.  Shoulder joints have good range of motion with tenderness over the deltoid insertion site bilaterally.  Both elbow joints have full flexion and extension but tenderness over the medial lateral epicondyles were noted.  Wrist joints, MCPs, PIPs, DIPs have good range of motion with no synovitis.  Complete fist formation bilaterally.  Hip joints have good range of motion.  Tenderness over bilateral trochanteric bursa.  Knee joints have good range of motion with discomfort bilaterally.  No  warmth or effusion of knee joints noted.  Ankle joints have good range of motion with no tenderness or synovitis.  No synovitis of MTP joints.  No evidence of achilles tendonitis or plantar fasciitis.   CDAI Exam: CDAI Score: -- Patient Global: --; Provider Global: -- Swollen: --; Tender: -- Joint Exam 06/12/2022   No joint exam has been documented for this visit   There is currently no information documented on the homunculus. Go to the Rheumatology activity and complete the homunculus joint exam.  Investigation: No additional findings.  Imaging: No results found.  Recent Labs: Lab Results  Component Value Date   WBC 9.1 09/10/2021   HGB 12.7 09/10/2021   PLT 350 09/10/2021   NA 141 05/02/2022   K 2.8 (L) 05/02/2022   CL 99 05/02/2022   CO2 26 05/02/2022   GLUCOSE 80 05/02/2022   BUN 13 05/02/2022   CREATININE 1.09 (H) 05/02/2022   BILITOT 0.5 09/10/2021   ALKPHOS 112 09/10/2021   AST 15 09/10/2021   ALT 11 09/10/2021   PROT 7.5 09/10/2021   ALBUMIN 4.3 09/10/2021   CALCIUM 9.3 05/02/2022   GFRAA 86 12/27/2019    Speciality Comments: No specialty comments available.  Procedures:  No procedures performed Allergies: Biaxin [clarithromycin], Iodinated contrast media, Depo-medrol [methylprednisolone], Shellfish allergy, Yellow jacket venom [bee venom], and Peanut-containing drug products   Assessment / Plan:     Visit Diagnoses: Pain in both hands -RF 175.8, ESR WNL on 05/02/22, generalized myalgias:  Patient presents today with pain and stiffness in both hands.  She experiences intermittent inflammation in her hands to the point that she cannot wear her rings.  No personal or family history of rheumatoid arthritis.  On examination today no obvious synovitis was noted.  X-rays of both hands were obtained today for further evaluation along with the following lab work.  Results will be discussed at her new patient follow-up visit.  Plan: XR Hand 2 View Left, XR Hand 2 View  Right, Rheumatoid factor, Cyclic citrul peptide antibody, IgG  Chronic pain of both knees - She presents today with pain in both knee joints.  She has difficulty climbing steps and rising from a seated position due to the discomfort.  On examination she has good range of motion of both knee joints with no  warmth or effusion.  X-rays of both knees were obtained today.  Plan: XR KNEE 3 VIEW LEFT, XR KNEE 3 VIEW RIGHT, Rheumatoid factor, Cyclic citrul peptide antibody, IgG  Pain in both feet - She experiences intermittent pain and stiffness in both feet.  She has PIP and DIP thickening consistent with osteoarthritis of both feet.  Some prominence of bilateral first MTP joints.  No synovitis noted.  No dactylitis noted.  No evidence of Achilles tendinitis or plantar fasciitis.  X-rays of both feet were obtained today.  Plan: XR Foot 2 Views Left, XR Foot 2 Views Right, Rheumatoid factor, Cyclic citrul peptide antibody, IgG  Myofascial pain -Patient has generalized hyperalgesia and positive tender points on examination.  Her symptoms are concerning for possible fibromyalgia.  According to the patient she was told by previous neurosurgeon that her symptoms are consistent with fibromyalgia.  Discussed the diagnosis of fibromyalgia today.  Will further discuss at her follow up visit.  plan: CK, TSH  Myalgia -She is experiencing generalized myalgias and muscle tenderness concerning for fibromyalgia.  She experiences muscle fatigue but has no obvious muscle weakness on examination today.  The following lab work will be obtained today for further evaluation.  Plan: Sedimentation rate, C-reactive protein, CK, TSH  Other fatigue - She experiences increased fatigue secondary to insomnia.  She has been experiencing nocturnal pain at night which has been contributing to her insomnia.  Discussed the importance of regular exercise and good sleep hygiene.  The following lab work will be obtained today.  Plan: CK,  TSH  Generalized pain - She has generalized hyperalgesia and positive tender points on examination consistent with myofascial pain.  The following lab work will be obtained today for further evaluation.  Rheumatoid factor positive - RF 175.8 on 05/02/22.  RF and anti-CCP will be checked today along with sed rate and CRP.  No synovitis was noted on examination today.  Other medical conditions are listed as follows:  Essential hypertension - BP was 136/86 today in the office.  Cervical radiculopathy: MRI of cervical spine 04/24/2022: Cervical spondylosis progressed at multiple levels since prior MRI 10/18/2004.  Vitamin D deficiency: She is taking vitamin D 50,000 units once weekly.  Prediabetes  Adjustment reaction with anxiety and depression    Orders: Orders Placed This Encounter  Procedures   XR Hand 2 View Left   XR Hand 2 View Right   XR Foot 2 Views Left   XR Foot 2 Views Right   XR KNEE 3 VIEW LEFT   XR KNEE 3 VIEW RIGHT   Rheumatoid factor   Cyclic citrul peptide antibody, IgG   Sedimentation rate   C-reactive protein   CK   TSH   No orders of the defined types were placed in this encounter.     Follow-Up Instructions: Return for NPFU.   Ofilia Neas, PA-C  Note - This record has been created using Dragon software.  Chart creation errors have been sought, but may not always  have been located. Such creation errors do not reflect on  the standard of medical care.

## 2022-06-04 ENCOUNTER — Other Ambulatory Visit: Payer: Self-pay | Admitting: Internal Medicine

## 2022-06-04 DIAGNOSIS — U071 COVID-19: Secondary | ICD-10-CM

## 2022-06-04 LAB — NOVEL CORONAVIRUS, NAA: SARS-CoV-2, NAA: DETECTED — AB

## 2022-06-04 MED ORDER — NIRMATRELVIR/RITONAVIR (PAXLOVID)TABLET: 3.0000 | ORAL_TABLET | Freq: Two times a day (BID) | ORAL | 0 refills | Status: AC

## 2022-06-05 ENCOUNTER — Telehealth: Payer: Self-pay | Admitting: Family Medicine

## 2022-06-05 NOTE — Telephone Encounter (Signed)
Spoke with pt she called in regards to [facility] paperwork having 05/19/22 on it when it needed to be 05/12/22 pt will have disability send anew blank copy

## 2022-06-05 NOTE — Telephone Encounter (Signed)
Called pt no answer left vm 

## 2022-06-05 NOTE — Telephone Encounter (Signed)
Patient retuning a call to nurse she said about questions nurse needed to ask her.

## 2022-06-05 NOTE — Telephone Encounter (Signed)
Patient returning call.

## 2022-06-06 ENCOUNTER — Encounter (HOSPITAL_COMMUNITY): Payer: BC Managed Care – PPO | Admitting: Physical Therapy

## 2022-06-09 ENCOUNTER — Telehealth (HOSPITAL_COMMUNITY): Payer: Self-pay | Admitting: Physical Therapy

## 2022-06-09 ENCOUNTER — Encounter (HOSPITAL_COMMUNITY): Payer: BC Managed Care – PPO | Admitting: Physical Therapy

## 2022-06-09 DIAGNOSIS — M797 Fibromyalgia: Secondary | ICD-10-CM | POA: Diagnosis not present

## 2022-06-09 DIAGNOSIS — Z6828 Body mass index (BMI) 28.0-28.9, adult: Secondary | ICD-10-CM | POA: Diagnosis not present

## 2022-06-09 NOTE — Telephone Encounter (Signed)
Pt no showed today.  Therapist called pt.  PT has Covid and is still not feeling well.  Cancelled all appointments this week and will start next week.   Rayetta Humphrey, [place] CLT [phone]

## 2022-06-11 ENCOUNTER — Encounter (HOSPITAL_COMMUNITY): Payer: BC Managed Care – PPO | Admitting: Physical Therapy

## 2022-06-12 ENCOUNTER — Ambulatory Visit (INDEPENDENT_AMBULATORY_CARE_PROVIDER_SITE_OTHER): Payer: BC Managed Care – PPO

## 2022-06-12 ENCOUNTER — Ambulatory Visit: Payer: BC Managed Care – PPO

## 2022-06-12 ENCOUNTER — Encounter: Payer: Self-pay | Admitting: Physician Assistant

## 2022-06-12 ENCOUNTER — Ambulatory Visit: Payer: BC Managed Care – PPO | Attending: Physician Assistant | Admitting: Physician Assistant

## 2022-06-12 ENCOUNTER — Telehealth: Payer: Self-pay | Admitting: Family Medicine

## 2022-06-12 VITALS — BP 136/86 | HR 66 | Resp 15 | Ht 66.0 in | Wt 173.0 lb

## 2022-06-12 DIAGNOSIS — M25561 Pain in right knee: Secondary | ICD-10-CM | POA: Diagnosis not present

## 2022-06-12 DIAGNOSIS — M79642 Pain in left hand: Secondary | ICD-10-CM | POA: Diagnosis not present

## 2022-06-12 DIAGNOSIS — R52 Pain, unspecified: Secondary | ICD-10-CM

## 2022-06-12 DIAGNOSIS — Z86718 Personal history of other venous thrombosis and embolism: Secondary | ICD-10-CM

## 2022-06-12 DIAGNOSIS — R768 Other specified abnormal immunological findings in serum: Secondary | ICD-10-CM

## 2022-06-12 DIAGNOSIS — M5412 Radiculopathy, cervical region: Secondary | ICD-10-CM

## 2022-06-12 DIAGNOSIS — M79671 Pain in right foot: Secondary | ICD-10-CM

## 2022-06-12 DIAGNOSIS — G8929 Other chronic pain: Secondary | ICD-10-CM | POA: Diagnosis not present

## 2022-06-12 DIAGNOSIS — M79672 Pain in left foot: Secondary | ICD-10-CM

## 2022-06-12 DIAGNOSIS — R5383 Other fatigue: Secondary | ICD-10-CM

## 2022-06-12 DIAGNOSIS — M791 Myalgia, unspecified site: Secondary | ICD-10-CM

## 2022-06-12 DIAGNOSIS — M7918 Myalgia, other site: Secondary | ICD-10-CM

## 2022-06-12 DIAGNOSIS — M79641 Pain in right hand: Secondary | ICD-10-CM

## 2022-06-12 DIAGNOSIS — E559 Vitamin D deficiency, unspecified: Secondary | ICD-10-CM

## 2022-06-12 DIAGNOSIS — F4323 Adjustment disorder with mixed anxiety and depressed mood: Secondary | ICD-10-CM

## 2022-06-12 DIAGNOSIS — M25562 Pain in left knee: Secondary | ICD-10-CM | POA: Diagnosis not present

## 2022-06-12 DIAGNOSIS — I10 Essential (primary) hypertension: Secondary | ICD-10-CM | POA: Diagnosis not present

## 2022-06-12 DIAGNOSIS — R7303 Prediabetes: Secondary | ICD-10-CM

## 2022-06-12 NOTE — Telephone Encounter (Signed)
Pt called back in regards to the pwk

## 2022-06-13 NOTE — Telephone Encounter (Signed)
Paperwork awaiting signature

## 2022-06-15 LAB — SEDIMENTATION RATE: Sed Rate: 14 mm/h (ref 0–20)

## 2022-06-15 LAB — CK: Total CK: 105 U/L (ref 29–143)

## 2022-06-15 LAB — CYCLIC CITRUL PEPTIDE ANTIBODY, IGG: Cyclic Citrullin Peptide Ab: <16 UNITS

## 2022-06-15 LAB — RHEUMATOID FACTOR: Rheumatoid fact SerPl-aCnc: 165 IU/mL — ABNORMAL HIGH (ref <14)

## 2022-06-15 LAB — C-REACTIVE PROTEIN: CRP: 4.5 mg/L (ref <8.0)

## 2022-06-15 LAB — TSH: TSH: 1.54 mIU/L

## 2022-06-16 ENCOUNTER — Telehealth: Payer: Self-pay | Admitting: Rheumatology

## 2022-06-16 NOTE — Telephone Encounter (Signed)
Reached out to patient to schedule ultrasound of her hands. Was advised to office patient first available which is in April. Patient states she needs to be evaluated before then. Explained to patient that this was just an ultrasound and that she has an appointment to follow up with Lovena Le on January 10th. Told the patient that Lovena Le does not do the ultrasounds that Dr. Estanislado Pandy does and that is the first thing she has available for an ultrasound. Patient states she needs to have the ultrasound before January 2nd because she is out of short term disability until then and still cannot work because of her hands. Patient states she needs to speak with Lovena Le today. Patient requests a call back.

## 2022-06-16 NOTE — Progress Notes (Signed)
RF remains positive.  Anti-CCP negative. ESR WNL.   CK WNL. TSH WNL.  CRP WNL.  Please clarify if the patient would like to schedule an ultrasound of her hands to assess for synovitis.

## 2022-06-17 ENCOUNTER — Ambulatory Visit (HOSPITAL_COMMUNITY): Payer: BC Managed Care – PPO | Attending: Neurosurgery | Admitting: Physical Therapy

## 2022-06-17 DIAGNOSIS — M5412 Radiculopathy, cervical region: Secondary | ICD-10-CM | POA: Diagnosis not present

## 2022-06-17 DIAGNOSIS — M6281 Muscle weakness (generalized): Secondary | ICD-10-CM | POA: Insufficient documentation

## 2022-06-17 NOTE — Progress Notes (Unsigned)
 Office Visit Note  Patient: [name]             Date of Birth: 11/01/1970           MRN: [id number]             PCP: Simpson, Margaret E, MD Referring: Simpson, Margaret E, MD Visit Date: 06/18/2022 Occupation: @GUAROCC@  Subjective:  Discuss lab work    History of Present Illness: [name] is a 51 y.o. female with history of positive RF, osteoarthritis, and myofascial pain.   Activities of Daily Living:  Patient reports morning stiffness for *** {minute/hour:19697}.   Patient {ACTIONS;DENIES/REPORTS:21021675::"Denies"} nocturnal pain.  Difficulty dressing/grooming: {ACTIONS;DENIES/REPORTS:21021675::"Denies"} Difficulty climbing stairs: {ACTIONS;DENIES/REPORTS:21021675::"Denies"} Difficulty getting out of chair: {ACTIONS;DENIES/REPORTS:21021675::"Denies"} Difficulty using hands for taps, buttons, cutlery, and/or writing: {ACTIONS;DENIES/REPORTS:21021675::"Denies"}  No Rheumatology ROS completed.   PMFS History:  Patient Active Problem List   Diagnosis Date Noted  . Acute gastroenteritis 05/05/2022  . Generalized pain 05/05/2022  . Elevated rheumatoid factor 05/05/2022  . Adjustment reaction with anxiety and depression 04/03/2022  . Screen for STD (sexually transmitted disease) 02/05/2022  . Acute low back pain without sciatica 11/28/2021  . Cervical radiculopathy 11/28/2021  . Benign skin mole 03/07/2020  . Callus of foot 03/07/2020  . Chronic superficial venous thrombosis of lower extremity 06/05/2019  . Allergic reaction 12/07/2018  . Multiple allergies 11/28/2015  . Prediabetes 08/01/2012  . Overweight (BMI 25.0-29.9) 08/01/2012  . Vitamin D deficiency 07/28/2012  . Allergic rhinitis 10/16/2009  . Neck pain 03/17/2009  . HYPOKALEMIA 03/14/2008  . THYROID NODULE 10/26/2007  . Essential hypertension 10/26/2007  . Back pain with radiation 10/26/2007    Past Medical History:  Diagnosis Date  . Back pain   . BV (bacterial vaginosis) 09/16/2013  . Chest pain  2000   right   . Contraceptive management 09/16/2013  . Encephalocele ([facility]) 1994  . Enlarged thyroid   . HSV-2 (herpes simplex virus 2) infection   . Hypertension   . Irregular intermenstrual bleeding 04/27/2015  . Obesity   . Preterm labor   . Sleep disorder   . Thyroid nodule   . Urinary frequency 12/14/2014  . Vaginal discharge 09/16/2013    Family History  Problem Relation Age of Onset  . Anuerysm Mother   . Stroke Mother   . Cancer Father        thyroid   . Hypertension Father   . Cancer Paternal Grandmother        breast  . Seizures Maternal Grandmother    Past Surgical History:  Procedure Laterality Date  . BREAST BIOPSY  2007  . btl  1994  . DILITATION & CURRETTAGE/HYSTROSCOPY WITH NOVASURE ABLATION N/A 05/06/2016   Procedure: DILATATION & CURETTAGE/HYSTEROSCOPY WITH NOVASURE ABLATION;  Surgeon: John V Ferguson, MD;  Location: [facility];  Service: Gynecology;  Laterality: N/A;  length-5; width-2.6; power-72 ; time- 1 minute 42 seconds  . LAPAROSCOPIC BILATERAL SALPINGECTOMY Bilateral 05/06/2016   Procedure: LAPAROSCOPIC BILATERAL SALPINGECTOMY;  Surgeon: John V Ferguson, MD;  Location: [facility];  Service: Gynecology;  Laterality: Bilateral;  . RETAINED PLACENTA REMOVAL    . TUBAL LIGATION     04/2016  . tubal ligation reversal  2001  . WISDOM TOOTH EXTRACTION     Social History   Social History Narrative  . Not on file   Immunization History  Administered Date(s) Administered  . Influenza,inj,Quad PF,6+ Mos 08/31/2014  . PFIZER(Purple Top)SARS-COV-2 Vaccination 02/18/2020  . Td 12/18/2010  . Tdap 07/07/2002       Objective: Vital Signs: There were no vitals taken for this visit.   Physical Exam Vitals and nursing note reviewed.  Constitutional:      Appearance: She is well-developed.  HENT:     Head: Normocephalic and atraumatic.  Eyes:     Conjunctiva/sclera: Conjunctivae normal.  Cardiovascular:     Rate and Rhythm: Normal rate and regular rhythm.      Heart sounds: Normal heart sounds.  Pulmonary:     Effort: Pulmonary effort is normal.     Breath sounds: Normal breath sounds.  Abdominal:     General: Bowel sounds are normal.     Palpations: Abdomen is soft.  Musculoskeletal:     Cervical back: Normal range of motion.  Skin:    General: Skin is warm and dry.     Capillary Refill: Capillary refill takes less than 2 seconds.  Neurological:     Mental Status: She is alert and oriented to person, place, and time.  Psychiatric:        Behavior: Behavior normal.     Musculoskeletal Exam: ***  CDAI Exam: CDAI Score: -- Patient Global: --; Provider Global: -- Swollen: --; Tender: -- Joint Exam 06/18/2022   No joint exam has been documented for this visit   There is currently no information documented on the homunculus. Go to the Rheumatology activity and complete the homunculus joint exam.  Investigation: No additional findings.  Imaging: XR KNEE 3 VIEW LEFT  Result Date: 06/12/2022 No medial lateral compartment narrowing was noted.  Mild patellofemoral narrowing was noted.  No chondrocalcinosis was noted. Impression: These findings are consistent with mild chondromalacia patella of the knee.  XR KNEE 3 VIEW RIGHT  Result Date: 06/12/2022 No medial or lateral compartment narrowing was noted.  Moderate patellofemoral narrowing was noted. Impression: These findings are consistent with moderate chondromalacia patella.  XR Foot 2 Views Left  Result Date: 06/12/2022 First MTP narrowing and hallux valgus deformity was noted.  Extra-articular calcification was noted around the first metatarsal head.  PIP and DIP narrowing was noted.  No intertarsal, tibiotalar or subtalar joint space narrowing was noted. No erosive changes were noted. Impression: These findings are consistent with osteoarthritis of the foot.  XR Foot 2 Views Right  Result Date: 06/12/2022 First MTP narrowing and hallux valgus deformity was noted.  PIP and DIP  narrowing was noted.  No intertarsal, tibiotalar or subtalar joint space narrowing was noted. No erosive changes were noted. Impression: These findings are consistent with osteoarthritis of the foot.  XR Hand 2 View Left  Result Date: 06/12/2022 Mild CMC and PIP narrowing was noted.  No MCP, intercarpal or radiocarpal joint space narrowing was noted.  No erosive changes were noted. Impression: These findings are consistent with early osteoarthritis of the hand.  XR Hand 2 View Right  Result Date: 06/12/2022 Mild CMC and PIP narrowing was noted.  No MCP, intercarpal or radiocarpal joint space narrowing was noted.  No erosive changes were noted. Impression: These findings are consistent with early osteoarthritis of the hand.   Recent Labs: Lab Results  Component Value Date   WBC 9.1 09/10/2021   HGB 12.7 09/10/2021   PLT 350 09/10/2021   NA 141 05/02/2022   K 2.8 (L) 05/02/2022   CL 99 05/02/2022   CO2 26 05/02/2022   GLUCOSE 80 05/02/2022   BUN 13 05/02/2022   CREATININE 1.09 (H) 05/02/2022   BILITOT 0.5 09/10/2021   ALKPHOS 112 09/10/2021   AST 15 09/10/2021  ALT 11 09/10/2021   PROT 7.5 09/10/2021   ALBUMIN 4.3 09/10/2021   CALCIUM 9.3 05/02/2022   GFRAA 86 12/27/2019    Speciality Comments: No specialty comments available.  Procedures:  No procedures performed Allergies: Biaxin [clarithromycin], Iodinated contrast media, Depo-medrol [methylprednisolone], Shellfish allergy, Yellow jacket venom [bee venom], and Peanut-containing drug products   Assessment / Plan:     Visit Diagnoses: Rheumatoid factor positive - 06/12/22: RF 165, anti-CCP-, ESR WNL, CRP WNL.  Pain in both hands - -RF 175.8, ESR WNL on 05/02/22, generalized myalgias, no family hx of RA, cannot wear rings.  Primary osteoarthritis of both hands - XR on 06/12/22 were consistent with early OA  Primary osteoarthritis of both feet  Chondromalacia patellae, left knee - Mild  Chondromalacia patellae, right  knee - Moderate  Myofascial pain  Cervical radiculopathy  Vitamin D deficiency  Prediabetes  Essential hypertension  Adjustment reaction with anxiety and depression  Orders: No orders of the defined types were placed in this encounter.  No orders of the defined types were placed in this encounter.    Follow-Up Instructions: No follow-ups on file.   Ofilia Neas, PA-C  Note - This record has been created using Dragon software.  Chart creation errors have been sought, but may not always  have been located. Such creation errors do not reflect on  the standard of medical care.

## 2022-06-17 NOTE — Therapy (Signed)
OUTPATIENT PHYSICAL THERAPY CERVICAL Treatment   Patient Name: [name] MRN: [id number] DOB:September 02, 1970, 51 y.o., female Today's Date: 06/17/2022   END OF SESSION:   PT End of Session - 06/17/22 1034    Visit Number 4    Number of Visits 12    Date for PT Re-Evaluation 07/29/22   Authorization Type BCBS    Progress Note Due on Visit 10    PT Start Time 0950    PT Stop Time 1033    PT Time Calculation (min) 40 min    Activity Tolerance Patient tolerated treatment well    Behavior During Therapy [facility] for tasks assessed/performed                     Past Medical History:  Diagnosis Date   Back pain    BV (bacterial vaginosis) 09/16/2013   Chest pain 2000   right    Contraceptive management 09/16/2013   Encephalocele ([place]) 1994   Enlarged thyroid    HSV-2 (herpes simplex virus 2) infection    Hypertension    Irregular intermenstrual bleeding 04/27/2015   Obesity    Preterm labor    Sleep disorder    Thyroid nodule    Urinary frequency 12/14/2014   Vaginal discharge 09/16/2013   Past Surgical History:  Procedure Laterality Date   BREAST BIOPSY  2007   btl  [place] N/A 05/06/2016   Procedure: DILATATION & CURETTAGE/HYSTEROSCOPY WITH NOVASURE ABLATION;  Surgeon: Jonnie Kind, MD;  Location: [facility];  Service: Gynecology;  Laterality: N/A;  length-5; width-2.6; power-72 ; time- 1 minute 42 seconds   LAPAROSCOPIC BILATERAL SALPINGECTOMY Bilateral 05/06/2016   Procedure: LAPAROSCOPIC BILATERAL SALPINGECTOMY;  Surgeon: Jonnie Kind, MD;  Location: [facility];  Service: Gynecology;  Laterality: Bilateral;   RETAINED PLACENTA REMOVAL     TUBAL LIGATION     04/2016   tubal ligation reversal  2001   WISDOM TOOTH EXTRACTION     Patient Active Problem List   Diagnosis Date Noted   Acute gastroenteritis 05/05/2022   Generalized pain 05/05/2022   Elevated rheumatoid factor 05/05/2022   Adjustment reaction with  anxiety and depression 04/03/2022   Screen for STD (sexually transmitted disease) 02/05/2022   Acute low back pain without sciatica 11/28/2021   Cervical radiculopathy 11/28/2021   Benign skin mole 03/07/2020   Callus of foot 03/07/2020   Chronic superficial venous thrombosis of lower extremity 06/05/2019   Allergic reaction 12/07/2018   Multiple allergies 11/28/2015   Prediabetes 08/01/2012   Overweight (BMI 25.0-29.9) 08/01/2012   Vitamin D deficiency 07/28/2012   Allergic rhinitis 10/16/2009   Neck pain 03/17/2009   HYPOKALEMIA 03/14/2008   THYROID NODULE 10/26/2007   Essential hypertension 10/26/2007   Back pain with radiation 10/26/2007    PCP: Tula Nakayama  REFERRING PROVIDER: Vallarie Mare, MD  REFERRING DIAG:  Diagnosis Description  PT eval/tx for [phone] cervical spondylosis w/o myelopathy    THERAPY DIAG:  cervicalalgia  Rationale for Evaluation and Treatment: Rehabilitation  ONSET DATE: chronic since 2010 with acute exacerbation   SUBJECTIVE:  SUBJECTIVE STATEMENT: Pt states that she just got over Covid.  She has not been able to get her exercises done.  She saw her RA MD and stated that she has more osteo than RA but her RA enzymes still are high.  She states that her neck is stiff an her pain is more on the right side.  PERTINENT HISTORY:  Chronic back and neck pain, HTN  PAIN:  Are you having pain? Yes: Pain location: 5 left side of neck, into Rt upper trap Pain description: aches  Aggravating factors: walking  Relieving factors: tylenol and heat   PRECAUTIONS: None  WEIGHT BEARING RESTRICTIONS: No  FALLS:  Has patient fallen in last 6 months? 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 Findings are most notably as follows.   At C5-C6, there is progressive mild-to-moderate disc degeneration with minimal degenerative endplate edema. Progressive disc bulge and bilateral uncovertebral hypertrophy. Superimposed small central disc protrusion. The disc bulge and disc protrusion efface the ventral thecal sac, contacting and minimally flattening the ventral aspect of the spinal cord. However, the dorsal CSF space is maintained within the spinal canal. Progressive bilateral neural foraminal narrowing (mild right, mild-to-moderate left).   No significant spinal canal stenosis at the remaining levels. Foraminal stenosis on the left at C6-C7 (moderate/severe) and on the left at C3-C4 (moderate).     Electronically Signed   By: Kellie Simmering D.O.   On: 04/26/2022 12:08  PATIENT SURVEYS:  FOTO 46    PALPATION: Noted tightness and mm spasm in B trapezius    CERVICAL ROM:   Active ROM A/PROM (deg) eval ARom  12/12  Flexion 30 reps  35  Extension 55 reps increase pain  55  Right lateral flexion 30 40  Left lateral flexion 20 30  Right rotation 45 80  Left rotation 40 70   (Blank rows = not tested) Cervical strength: Extension: 2- Rt side bend: 2- Lt side bend:2- Minimal exertion noted  UPPER EXTREMITY ROM: Functional with significant encouragement noted facial and verbal expression of pain.  Raising of arms is very slow which, in fact, would make this activity more difficult.   UPPER EXTREMITY MMT:  MMT Right eval 12/12/ Left eval 12/12/  Shoulder flexion _0 Shoulder extension      Shoulder abduction 3 3+ 3 4  Shoulder adduction      Shoulder extension      Shoulder internal rotation 3 4+ 3 4+  Shoulder  external rotation _1 Middle trapezius      Lower trapezius      Elbow flexion 4  4   Elbow extension 4  4   Wrist flexion      Wrist extension      Wrist ulnar deviation      Wrist radial deviation      Wrist pronation      Wrist supination      Grip strength _2 (Blank rows = not tested) All manual mm testing had a give way  Fast exchange grip strength is 35#, fast exchange should be less than grip strength. Fast exchange on 12/12:  35# TODAY'S TREATMENT:  DATE:  06/17/22 Attempted manual to decrease pain but pt is intolerant. Supine: Scapular retraction x 10 Cervical retraction x 10 Sitting: Postural correction x 10 Cervical and thoracici excursions x 3  Isometric x 5 for SB and extension x5 Retro shoulder circles x 10  05/27/22 Seated:  scapular retractions 10X  3D Thoracic excursions with UE movements 5X each  3D cervical excursions 5X each  05/23/22 Reviewed HEP and goals Seated thoracic extension x10 Seated bil RTB ER x10   evaluation:    Seated Cervical Retraction  -3 - 3-5" hold - Seated Scapular Retraction  3 - 3-5" hold - Standing Cervical Retraction with Sidebending  3 - 10 reps - 3-5" hold - Seated Cervical Retraction and Rotation  3 - 3-5" hold - Seated Transversus Abdominis Bracing  3 x 5" PATIENT EDUCATION:  Education details: HEP and the importance of good posture Person educated: Patient Education method: Explanation Education comprehension: returned demonstration  HOME EXERCISE PROGRAM: Access Code: [email or web address] URL: [email or web address] Date: 05/21/2022 Prepared by: Rayetta Humphrey 12/12:  Cervical and thoracic excursions.   Exercises - Seated Cervical Retraction  - 2 x daily - 7 x weekly - 1 sets - 10 reps - 3-5" hold - Seated Scapular Retraction  - 2 x daily - 7 x weekly - 1 sets - 10 reps  - 3-5" hold - Standing Cervical Retraction with Sidebending  - 2 x daily - 7 x weekly - 1 sets - 10 reps - 3-5" hold - Seated Cervical Retraction and Rotation  - 2 x daily - 7 x weekly - 1 sets - 10 reps - 3-5" hold - Seated Transversus Abdominis Bracing  - 4 x daily - 7 x weekly - 1 sets - 10 reps - 3" hold  ASSESSMENT:  CLINICAL IMPRESSION: Pt has not been back to treatment for two weeks secondary to illness.  Pt reassessed with noted improvement in ROM and strength, however, pt pain still remains high.  PT HEP updated therapist also gave pt instruction to purchase McKenzie lumbar roll to improve sitting posture.  Attempted manual to decrease pain, with mild tightness noted, however pt was intolerant to manual.    PT will continue to benefit from skilled therapy to reduce pain and improve overall functional tolerance.  OBJECTIVE IMPAIRMENTS: decreased activity tolerance, decreased ROM, decreased strength, increased fascial restrictions, increased muscle spasms, impaired UE functional use, and pain.   ACTIVITY LIMITATIONS: carrying, lifting, sitting, sleeping, and reach over head  PARTICIPATION LIMITATIONS: meal prep, cleaning, laundry, shopping, community activity, and occupation  PERSONAL FACTORS: Behavior pattern and Time since onset of injury/illness/exacerbation are also affecting patient's functional outcome.   REHAB POTENTIAL: Fair    CLINICAL DECISION MAKING: Evolving/moderate complexity  EVALUATION COMPLEXITY: Moderate   GOALS: Goals reviewed with patient? No  SHORT TERM GOALS: Target date: 07/08/2022   PT to be I in HEP to increase cervcial ROM to at least 60 for safer driving.  Baseline:  Goal status: MET  2.  PT pain to be no greater than a 5/10  Baseline:  Goal status: IN PROGRESS  3.  PT strength of UE to increase 1/2 grade so that pt is able to raise arms above her head in a normal velocity without pain.  Baseline:  Goal status: MET    LONG TERM GOALS: Target  date: 07/29/2022  PT to be I in an advanced stabilization exercise program to decrease pain to no greater than a 3/10 Baseline:  Goal status: IN PROGRESS  2.  PT UE strength to be increased 1 grade to allow improved functional use.  Baseline:  Goal status: MET  3.  Hand grip to increase to fast exchange rate of  35# for functional use  Baseline:  Goal status: INITIAL  4.  ROM of cervical spine to be to 70 to allow full scanning of enviornment.  Goal status: MET     PLAN:  PT FREQUENCY: 2x/week  PT DURATION: 6 weeks  PLANNED INTERVENTIONS: Therapeutic exercises, Therapeutic activity, Patient/Family education, Self Care, and Manual therapy  PLAN FOR NEXT SESSION: Give cervical isometrics for HEP.  begin cervical stabilization supine with UE motion, progress to sitting and standing.  Rayetta Humphrey, PT CLT [phone]  10:33 AM

## 2022-06-17 NOTE — Telephone Encounter (Signed)
[place] scheduled 06/18/2022 at 9:00, appt w/Taylor 06/18/2022 9:40, patient is aware.

## 2022-06-17 NOTE — Telephone Encounter (Signed)
Please advise soonest available time for schedule ultrasound

## 2022-06-18 ENCOUNTER — Ambulatory Visit: Payer: BC Managed Care – PPO

## 2022-06-18 ENCOUNTER — Ambulatory Visit (INDEPENDENT_AMBULATORY_CARE_PROVIDER_SITE_OTHER): Payer: BC Managed Care – PPO | Admitting: Physician Assistant

## 2022-06-18 ENCOUNTER — Ambulatory Visit: Payer: BC Managed Care – PPO | Attending: Rheumatology | Admitting: Rheumatology

## 2022-06-18 ENCOUNTER — Encounter: Payer: Self-pay | Admitting: Physician Assistant

## 2022-06-18 VITALS — BP 124/82 | HR 64 | Resp 15 | Ht 66.0 in | Wt 173.6 lb

## 2022-06-18 DIAGNOSIS — M79641 Pain in right hand: Secondary | ICD-10-CM | POA: Diagnosis not present

## 2022-06-18 DIAGNOSIS — M5412 Radiculopathy, cervical region: Secondary | ICD-10-CM

## 2022-06-18 DIAGNOSIS — Z79899 Other long term (current) drug therapy: Secondary | ICD-10-CM

## 2022-06-18 DIAGNOSIS — M19071 Primary osteoarthritis, right ankle and foot: Secondary | ICD-10-CM

## 2022-06-18 DIAGNOSIS — M79642 Pain in left hand: Secondary | ICD-10-CM

## 2022-06-18 DIAGNOSIS — F4323 Adjustment disorder with mixed anxiety and depressed mood: Secondary | ICD-10-CM

## 2022-06-18 DIAGNOSIS — M2241 Chondromalacia patellae, right knee: Secondary | ICD-10-CM

## 2022-06-18 DIAGNOSIS — M19072 Primary osteoarthritis, left ankle and foot: Secondary | ICD-10-CM

## 2022-06-18 DIAGNOSIS — M19042 Primary osteoarthritis, left hand: Secondary | ICD-10-CM

## 2022-06-18 DIAGNOSIS — R768 Other specified abnormal immunological findings in serum: Secondary | ICD-10-CM

## 2022-06-18 DIAGNOSIS — M19041 Primary osteoarthritis, right hand: Secondary | ICD-10-CM

## 2022-06-18 DIAGNOSIS — I10 Essential (primary) hypertension: Secondary | ICD-10-CM

## 2022-06-18 DIAGNOSIS — M2242 Chondromalacia patellae, left knee: Secondary | ICD-10-CM

## 2022-06-18 DIAGNOSIS — M0579 Rheumatoid arthritis with rheumatoid factor of multiple sites without organ or systems involvement: Secondary | ICD-10-CM

## 2022-06-18 DIAGNOSIS — R7303 Prediabetes: Secondary | ICD-10-CM

## 2022-06-18 DIAGNOSIS — E559 Vitamin D deficiency, unspecified: Secondary | ICD-10-CM

## 2022-06-18 DIAGNOSIS — M7918 Myalgia, other site: Secondary | ICD-10-CM

## 2022-06-18 NOTE — Progress Notes (Signed)
Pharmacy Note  Subjective: Patient presents today to [facility] for follow up office visit.   Patient seen by the pharmacist for counseling on hydroxychloroquine for rheumatoid arthritis.    Objective: CMP     Component Value Date/Time   NA 141 05/02/2022 0828   K 2.8 (L) 05/02/2022 0828   CL 99 05/02/2022 0828   CO2 26 05/02/2022 0828   GLUCOSE 80 05/02/2022 0828   GLUCOSE 82 12/27/2019 0737   BUN 13 05/02/2022 0828   CREATININE 1.09 (H) 05/02/2022 0828   CREATININE 0.91 12/27/2019 0737   CALCIUM 9.3 05/02/2022 0828   PROT 7.5 09/10/2021 1335   ALBUMIN 4.3 09/10/2021 1335   AST 15 09/10/2021 1335   ALT 11 09/10/2021 1335   ALKPHOS 112 09/10/2021 1335   BILITOT 0.5 09/10/2021 1335   GFRNONAA 75 12/27/2019 0737   GFRAA 86 12/27/2019 0737    CBC    Component Value Date/Time   WBC 9.1 09/10/2021 1335   WBC 8.4 12/27/2019 0737   RBC 5.66 (H) 09/10/2021 1335   RBC 5.67 (H) 12/27/2019 0737   HGB 12.7 09/10/2021 1335   HCT 39.8 09/10/2021 1335   PLT 350 09/10/2021 1335   MCV 70 (L) 09/10/2021 1335   MCH 22.4 (L) 09/10/2021 1335   MCH 21.9 (L) 12/27/2019 0737   MCHC 31.9 09/10/2021 1335   MCHC 31.3 (L) 12/27/2019 0737   RDW 15.6 (H) 09/10/2021 1335   LYMPHSABS 1.7 04/07/2012 1105   MONOABS 0.5 04/07/2012 1105   EOSABS 0.1 04/07/2012 1105   BASOSABS 0.1 04/07/2012 1105    Assessment/Plan: Patient was counseled on the purpose, proper use, and adverse effects of hydroxychloroquine including nausea/diarrhea, skin rash, headaches, and sun sensitivity.  Advised patient to wear sunscreen once starting hydroxychloroquine to reduce risk of rash associated with sun sensitivity.  Discussed importance of annual eye exams while on hydroxychloroquine to monitor to ocular toxicity and discussed importance of frequent laboratory monitoring.  Provided patient with eye exam form for baseline ophthalmologic exam.  Reviewed risk for QTC prolongation when used in combination with  other QTc prolonging agents (including but not limited to antiarrhythmics, macrolide antibiotics, flouroquinolones, tricyclic antidepressants, citalopram, specific antipsychotics, ondansetron, migraine triptans, and methadone). Provided patient with educational materials on hydroxychloroquine and answered all questions.  Patient consented to hydroxychloroquine. Will upload consent in the media tab.   She is already planning to follow-up with ophthalmology (for possible cataracts). She was provided with eye exam form to complete scans needed for  She will titrate her dose up to the maintenance dose of Plaquenil 200 mg twice daily Monday through Friday.  Prescription pending lab results from today  Knox Saliva, PharmD, MPH, BCPS, CPP Clinical Pharmacist (Rheumatology and Pulmonology)

## 2022-06-18 NOTE — Progress Notes (Signed)
Visit diagnosis: Pain in both hands and positive rheumatoid factor.  Ultrasound examination was performed to evaluate for synovitis and tenosynovitis.  Ultrasound examination of bilateral hands was performed per EULAR recommendations. Using 15 MHz transducer, grayscale and power Doppler bilateral second,and third MCP joints  both dorsal and volar aspects were evaluated to look for synovitis or tenosynovitis. The findings were there was no synovitis or tenosynovitis on ultrasound examination.   Impression: Ultrasound examination did not show synovitis or tenosynovitis.  Ultrasound findings were discussed with the patient.  Bo Merino, MD

## 2022-06-18 NOTE — Patient Instructions (Addendum)
Standing Labs We placed an order today for your standing lab work.   Please have your standing labs drawn in 1 month, 3 months, and then every 5 months   Please have your labs drawn 2 weeks prior to your appointment so that the provider can discuss your lab results at your appointment.  Please note that you may see your imaging and lab results in [place] before we have reviewed them. We will contact you once all results are reviewed. Please allow our office up to 72 hours to thoroughly review all of the results before contacting the office for clarification of your results.  Lab hours are:   Monday through Thursday from 8:00 am -12:30 pm and 1:00 pm-5:00 pm and Friday from 8:00 am-12:00 pm.  Please be advised, all patients with office appointments requiring lab work will take precedent over walk-in lab work.   Labs are drawn by [facility]. Please bring your co-pay at the time of your lab draw.  You may receive a bill from [facility] for your lab work.  Please note if you are on Hydroxychloroquine and and an order has been placed for a Hydroxychloroquine level, you will need to have it drawn 4 hours or more after your last dose.  If you wish to have your labs drawn at another location, please call the office 24 hours in advance so we can fax the orders.  The office is located at [address], [place], [place], [place] [address] No appointment is necessary.    If you have any questions regarding directions or hours of operation,  please call [phone].   As a reminder, please drink plenty of water prior to coming for your lab work. Thanks!   Hydroxychloroquine Tablets What is this medication? HYDROXYCHLOROQUINE (hye drox ee KLOR oh kwin) treats autoimmune conditions, such as rheumatoid arthritis and lupus. It works by slowing down an overactive immune system. It may also be used to prevent and treat malaria. It works by killing the parasite that causes malaria. It belongs to a group of  medications called DMARDs. This medicine may be used for other purposes; ask your health care provider or pharmacist if you have questions. COMMON BRAND NAME(S): Plaquenil, Quineprox What should I tell my care team before I take this medication? They need to know if you have any of these conditions: Diabetes Eye disease, vision problems Frequently drink alcohol G6PD deficiency Heart disease Irregular heartbeat or rhythm Kidney disease Liver disease Porphyria Psoriasis An unusual or allergic reaction to hydroxychloroquine, other medications, foods, dyes, or preservatives Pregnant or trying to get pregnant Breastfeeding How should I use this medication? Take this medication by mouth with water. Take it as directed on the prescription label. Do not cut, crush, or chew this medication. Swallow the tablets whole. Take it with food. Do not take it more than directed. Take all of this medication unless your care team tells you to stop it early. Keep taking it even if you think you are better. Take products with antacids in them at a different time of day than this medication. Take this medication 4 hours before or 4 hours after antacids. Talk to your care team if you have questions. Talk to your care team about the use of this medication in children. While this medication may be prescribed for selected conditions, precautions do apply. Overdosage: If you think you have taken too much of this medicine contact a poison control center or emergency room at once. NOTE: This medicine is  only for you. Do not share this medicine with others. What if I miss a dose? If you miss a dose, take it as soon as you can. If it is almost time for your next dose, take only that dose. Do not take double or extra doses. What may interact with this medication? Do not take this medication with any of the following: Cisapride Dronedarone Pimozide Thioridazine This medication may also interact with the  following: Ampicillin Antacids Cimetidine Cyclosporine Digoxin Kaolin Medications for diabetes, such as insulin, glipizide, glyburide Medications for seizures, such as carbamazepine, phenobarbital, phenytoin Mefloquine Methotrexate Other medications that cause heart rhythm changes Praziquantel This list may not describe all possible interactions. Give your health care provider a list of all the medicines, herbs, non-prescription drugs, or dietary supplements you use. Also tell them if you smoke, drink alcohol, or use illegal drugs. Some items may interact with your medicine. What should I watch for while using this medication? Visit your care team for regular checks on your progress. Tell your care team if your symptoms do not start to get better or if they get worse. You may need blood work done while you are taking this medication. If you take other medications that can affect heart rhythm, you may need more testing. Talk to your care team if you have questions. Your vision may be tested before and during use of this medication. Tell your care team right away if you have any change in your eyesight. This medication may cause serious skin reactions. They can happen weeks to months after starting the medication. Contact your care team right away if you notice fevers or flu-like symptoms with a rash. The rash may be red or purple and then turn into blisters or peeling of the skin. Or, you might notice a red rash with swelling of the face, lips or lymph nodes in your neck or under your arms. If you or your family notice any changes in your behavior, such as new or worsening depression, thoughts of harming yourself, anxiety, or other unusual or disturbing thoughts, or memory loss, call your care team right away. What side effects may I notice from receiving this medication? Side effects that you should report to your care team as soon as possible: Allergic reactions--skin rash, itching, hives,  swelling of the face, lips, tongue, or throat Aplastic anemia--unusual weakness or fatigue, dizziness, headache, trouble breathing, increased bleeding or bruising Change in vision Heart rhythm changes--fast or irregular heartbeat, dizziness, feeling faint or lightheaded, chest pain, trouble breathing Infection--fever, chills, cough, or sore throat Low blood sugar (hypoglycemia)--tremors or shaking, anxiety, sweating, cold or clammy skin, confusion, dizziness, rapid heartbeat Muscle injury--unusual weakness or fatigue, muscle pain, dark yellow or brown urine, decrease in amount of urine Pain, tingling, or numbness in the hands or feet Rash, fever, and swollen lymph nodes Redness, blistering, peeling, or loosening of the skin, including inside the mouth Thoughts of suicide or self-harm, worsening mood, or feelings of depression Unusual bruising or bleeding Side effects that usually do not require medical attention (report to your care team if they continue or are bothersome): Diarrhea Headache Nausea Stomach pain Vomiting This list may not describe all possible side effects. Call your doctor for medical advice about side effects. You may report side effects to FDA at 1-800-FDA-1088. Where should I keep my medication? Keep out of the reach of children and pets. Store at room temperature up to 30 degrees C (86 degrees F). Protect from light. Get rid of  any unused medication after the expiration date. To get rid of medications that are no longer needed or have expired: Take the medication to a medication take-back program. Check with your pharmacy or law enforcement to find a location. If you cannot return the medication, check the label or package insert to see if the medication should be thrown out in the garbage or flushed down the toilet. If you are not sure, ask your care team. If it is safe to put it in the trash, empty the medication out of the container. Mix the medication with cat litter,  dirt, coffee grounds, or other unwanted substance. Seal the mixture in a bag or container. Put it in the trash. NOTE: This sheet is a summary. It may not cover all possible information. If you have questions about this medicine, talk to your doctor, pharmacist, or health care provider.  2023 Elsevier/Gold Standard (2004-09-03 00:00:00)

## 2022-06-19 ENCOUNTER — Encounter: Payer: Self-pay | Admitting: Family Medicine

## 2022-06-19 ENCOUNTER — Ambulatory Visit (HOSPITAL_COMMUNITY): Payer: BC Managed Care – PPO | Admitting: Physical Therapy

## 2022-06-19 DIAGNOSIS — M5412 Radiculopathy, cervical region: Secondary | ICD-10-CM

## 2022-06-19 DIAGNOSIS — M6281 Muscle weakness (generalized): Secondary | ICD-10-CM | POA: Diagnosis not present

## 2022-06-19 NOTE — Therapy (Signed)
OUTPATIENT PHYSICAL THERAPY CERVICAL Treatment   Patient Name: [name] MRN: [id number] DOB:March 05, 1971, 51 y.o., female Today's Date: 06/19/2022   END OF SESSION:   PT End of Session - 06/19/22 1036     Visit Number 5    Number of Visits 12    Date for PT Re-Evaluation 07/02/22    Authorization Type BCBS    Progress Note Due on Visit 10    PT Start Time 0958    PT Stop Time 1036    PT Time Calculation (min) 38 min    Activity Tolerance Patient tolerated treatment well    Behavior During Therapy [facility] for tasks assessed/performed            Past Medical History:  Diagnosis Date   Back pain    BV (bacterial vaginosis) 09/16/2013   Chest pain 2000   right    Contraceptive management 09/16/2013   Encephalocele ([place]) 1994   Enlarged thyroid    HSV-2 (herpes simplex virus 2) infection    Hypertension    Irregular intermenstrual bleeding 04/27/2015   Obesity    Preterm labor    Sleep disorder    Thyroid nodule    Urinary frequency 12/14/2014   Vaginal discharge 09/16/2013   Past Surgical History:  Procedure Laterality Date   BREAST BIOPSY  2007   btl  [place] N/A 05/06/2016   Procedure: DILATATION & CURETTAGE/HYSTEROSCOPY WITH NOVASURE ABLATION;  Surgeon: Jonnie Kind, MD;  Location: [facility];  Service: Gynecology;  Laterality: N/A;  length-5; width-2.6; power-72 ; time- 1 minute 42 seconds   LAPAROSCOPIC BILATERAL SALPINGECTOMY Bilateral 05/06/2016   Procedure: LAPAROSCOPIC BILATERAL SALPINGECTOMY;  Surgeon: Jonnie Kind, MD;  Location: [facility];  Service: Gynecology;  Laterality: Bilateral;   RETAINED PLACENTA REMOVAL     TUBAL LIGATION     04/2016   tubal ligation reversal  2001   WISDOM TOOTH EXTRACTION     Patient Active Problem List   Diagnosis Date Noted   Acute gastroenteritis 05/05/2022   Generalized pain 05/05/2022   Elevated rheumatoid factor 05/05/2022   Adjustment reaction with anxiety and  depression 04/03/2022   Screen for STD (sexually transmitted disease) 02/05/2022   Acute low back pain without sciatica 11/28/2021   Cervical radiculopathy 11/28/2021   Benign skin mole 03/07/2020   Callus of foot 03/07/2020   Chronic superficial venous thrombosis of lower extremity 06/05/2019   Allergic reaction 12/07/2018   Multiple allergies 11/28/2015   Prediabetes 08/01/2012   Overweight (BMI 25.0-29.9) 08/01/2012   Vitamin D deficiency 07/28/2012   Allergic rhinitis 10/16/2009   Neck pain 03/17/2009   HYPOKALEMIA 03/14/2008   THYROID NODULE 10/26/2007   Essential hypertension 10/26/2007   Back pain with radiation 10/26/2007    PCP: Tula Nakayama  REFERRING PROVIDER: Vallarie Mare, MD  REFERRING DIAG:  Diagnosis Description  PT eval/tx for [phone] cervical spondylosis w/o myelopathy    THERAPY DIAG:  cervicalalgia  Rationale for Evaluation and Treatment: Rehabilitation  ONSET DATE: chronic since 2010 with acute exacerbation   SUBJECTIVE:  SUBJECTIVE STATEMENT: Pt states that her neck is feeling better.  Her [place] was (-) for her hands.    PERTINENT HISTORY:  Chronic back and neck pain, HTN  PAIN:  Are you having pain? Yes: Pain location: 4 left side of neck, into Rt upper trap and mid back  Pain description: aches  Aggravating factors: walking  Relieving factors: tylenol and heat   PRECAUTIONS: None  WEIGHT BEARING RESTRICTIONS: No  FALLS:  Has patient fallen in last 6 months? 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 Findings are most notably as follows.   At C5-C6, there is progressive mild-to-moderate disc degeneration with minimal degenerative endplate edema. Progressive disc bulge and bilateral uncovertebral hypertrophy. Superimposed small central disc protrusion. The disc bulge and disc protrusion efface the ventral thecal sac, contacting and minimally flattening the ventral aspect of the spinal cord. However, the dorsal CSF space is maintained within the spinal canal. Progressive bilateral neural foraminal narrowing (mild right, mild-to-moderate left).   No significant spinal canal stenosis at the remaining levels. Foraminal stenosis on the left at C6-C7 (moderate/severe) and on the left at C3-C4 (moderate).     Electronically Signed   By: Kellie Simmering D.O.   On: 04/26/2022 12:08  PATIENT SURVEYS:  FOTO 46    PALPATION: Noted tightness and mm spasm in B trapezius    CERVICAL ROM:   Active ROM A/PROM (deg) eval ARom  12/12  Flexion 30 reps  35  Extension 55 reps increase pain  55  Right lateral flexion 30 40  Left lateral flexion 20 30  Right rotation 45 80  Left rotation 40 70   (Blank rows = not tested) Cervical strength: Extension: 2- Rt side bend: 2- Lt side bend:2- Minimal exertion noted  UPPER EXTREMITY ROM: Functional with significant encouragement noted facial and verbal expression of pain.  Raising of arms is very slow which, in fact, would make this activity more difficult.   UPPER EXTREMITY MMT:  MMT Right eval 12/12/ Left eval 12/12/  Shoulder flexion _0 Shoulder extension      Shoulder abduction 3 3+ 3 4  Shoulder adduction      Shoulder extension      Shoulder internal rotation 3 4+ 3 4+  Shoulder external rotation _1 Middle trapezius      Lower trapezius      Elbow flexion 4  4   Elbow extension 4  4   Wrist flexion      Wrist extension      Wrist ulnar  deviation      Wrist radial deviation      Wrist pronation      Wrist supination      Grip strength _2 (Blank rows = not tested) All manual mm testing had a give way  Fast exchange grip strength is 35#, fast exchange should be less than grip strength. Fast exchange on 12/12:  35# TODAY'S TREATMENT:  DATE:  06/19/22: Standing: Postural exercise with green theraband: Scapular retraction x 10 Rows x 10 Shoulder extension x 10  [place] flexion with back to the wall with scapular retraction  Sitting:  W back with 2# x 15 X to V x 15 Money x 15 Trap stretch B 3 x 20"  Cervical isometric with SB and extension x 10 each 5" hold  Shoulder ER with green thera-band x 10 with 5" hold.   Scapular retraction with cervical retraction x 10  PNF I with red t-band B x 10 (sash) 06/17/22 Attempted manual to decrease pain but pt is intolerant. Supine: Scapular retraction x 10 Cervical retraction x 10 Sitting: Postural correction x 10 Cervical and thoracici excursions x 3  Isometric x 5 for SB and extension x5 Retro shoulder circles x 10  05/27/22 Seated:  scapular retractions 10X  3D Thoracic excursions with UE movements 5X each  3D cervical excursions 5X each  05/23/22 Reviewed HEP and goals Seated thoracic extension x10 Seated bil RTB ER x10   evaluation:    Seated Cervical Retraction  -3 - 3-5" hold - Seated Scapular Retraction  3 - 3-5" hold - Standing Cervical Retraction with Sidebending  3 - 10 reps - 3-5" hold - Seated Cervical Retraction and Rotation  3 - 3-5" hold - Seated Transversus Abdominis Bracing  3 x 5" PATIENT EDUCATION:  Education details: HEP and the importance of good posture Person educated: Patient Education method: Explanation Education comprehension: returned demonstration  HOME EXERCISE PROGRAM:               Access Code:  [email or web address] URL: [email or web address] Date: 05/21/2022 Prepared by: Rayetta Humphrey 06/19/22:  postural theraband exercises with green theraband for rows, scapular retraction and shoulder extension  12/12:  Cervical and thoracic excursions.   Exercises - Seated Cervical Retraction  - 2 x daily - 7 x weekly - 1 sets - 10 reps - 3-5" hold - Seated Scapular Retraction  - 2 x daily - 7 x weekly - 1 sets - 10 reps - 3-5" hold - Standing Cervical Retraction with Sidebending  - 2 x daily - 7 x weekly - 1 sets - 10 reps - 3-5" hold - Seated Cervical Retraction and Rotation  - 2 x daily - 7 x weekly - 1 sets - 10 reps - 3-5" hold - Seated Transversus Abdominis Bracing  - 4 x daily - 7 x weekly - 1 sets - 10 reps - 3" hold  ASSESSMENT:  CLINICAL IMPRESSION: Therapist progressed pt to standing stabilization and postural exercise with updated HEP.  Pt also increased reps and wt with sitting exercises to improve activity tolerance and strength of upper back and cervical area.    PT will continue to benefit from skilled therapy to reduce pain and improve overall functional tolerance.  OBJECTIVE IMPAIRMENTS: decreased activity tolerance, decreased ROM, decreased strength, increased fascial restrictions, increased muscle spasms, impaired UE functional use, and pain.   ACTIVITY LIMITATIONS: carrying, lifting, sitting, sleeping, and reach over head  PARTICIPATION LIMITATIONS: meal prep, cleaning, laundry, shopping, community activity, and occupation  PERSONAL FACTORS: Behavior pattern and Time since onset of injury/illness/exacerbation are also affecting patient's functional outcome.   REHAB POTENTIAL: Fair    CLINICAL DECISION MAKING: Evolving/moderate complexity  EVALUATION COMPLEXITY: Moderate   GOALS: Goals reviewed with patient? No  SHORT TERM GOALS: Target date: 07/10/2022   PT to be I in HEP to increase cervcial ROM to at least 60 for safer driving.  Baseline:  Goal status:  MET  2.  PT pain to be no greater than a 5/10  Baseline:  Goal status: IN PROGRESS  3.  PT strength of UE to increase 1/2 grade so that pt is able to raise arms above her head in a normal velocity without pain.  Baseline:  Goal status: MET    LONG TERM GOALS: Target date: 07/31/2022  PT to be I in an advanced stabilization exercise program to decrease pain to no greater than a 3/10 Baseline:  Goal status: IN PROGRESS  2.  PT UE strength to be increased 1 grade to allow improved functional use.  Baseline:  Goal status: MET  3.  Hand grip to increase to fast exchange rate of  35# for functional use  Baseline:  Goal status: INITIAL  4.  ROM of cervical spine to be to 70 to allow full scanning of enviornment.  Goal status: MET     PLAN:  PT FREQUENCY: 2x/week  PT DURATION: 6 weeks  PLANNED INTERVENTIONS: Therapeutic exercises, Therapeutic activity, Patient/Family education, Self Care, and Manual therapy  PLAN FOR NEXT SESSION: Give cervical isometrics for HEP.  begin cervical stabilization supine with UE motion, progress to sitting and standing.  Rayetta Humphrey, PT CLT [phone]  10:36 AM

## 2022-06-19 NOTE — Progress Notes (Signed)
Potassium is low-3.1 but is trending up.  Rest of CMP WNL. Please notify the patient and forward results to PCP.   WBC count is slightly elevated.  Please clarify if she has had any recent infections or prednisone use? Rest of CBC stable.

## 2022-06-23 ENCOUNTER — Other Ambulatory Visit: Payer: Self-pay | Admitting: Family Medicine

## 2022-06-23 ENCOUNTER — Other Ambulatory Visit: Payer: Self-pay | Admitting: *Deleted

## 2022-06-23 DIAGNOSIS — D72829 Elevated white blood cell count, unspecified: Secondary | ICD-10-CM

## 2022-06-23 LAB — CBC WITH DIFFERENTIAL/PLATELET
Absolute Monocytes: 845 cells/uL (ref 200–950)
Basophils Absolute: 64 cells/uL (ref 0–200)
Basophils Relative: 0.5 %
Eosinophils Absolute: 166 cells/uL (ref 15–500)
Eosinophils Relative: 1.3 %
HCT: 39.9 % (ref 35.0–45.0)
Hemoglobin: 12.7 g/dL (ref 11.7–15.5)
Lymphs Abs: 1498 cells/uL (ref 850–3900)
MCH: 22.2 pg — ABNORMAL LOW (ref 27.0–33.0)
MCHC: 31.8 g/dL — ABNORMAL LOW (ref 32.0–36.0)
MCV: 69.8 fL — ABNORMAL LOW (ref 80.0–100.0)
MPV: 12.2 fL (ref 7.5–12.5)
Monocytes Relative: 6.6 %
Neutro Abs: 10227 cells/uL — ABNORMAL HIGH (ref 1500–7800)
Neutrophils Relative %: 79.9 %
Platelets: 360 10*3/uL (ref 140–400)
RBC: 5.72 10*6/uL — ABNORMAL HIGH (ref 3.80–5.10)
RDW: 15.7 % — ABNORMAL HIGH (ref 11.0–15.0)
Total Lymphocyte: 11.7 %
WBC: 12.8 10*3/uL — ABNORMAL HIGH (ref 3.8–10.8)

## 2022-06-23 LAB — COMPLETE METABOLIC PANEL WITH GFR
AG Ratio: 1.3 (calc) (ref 1.0–2.5)
ALT: 15 U/L (ref 6–29)
AST: 14 U/L (ref 10–35)
Albumin: 4.4 g/dL (ref 3.6–5.1)
Alkaline phosphatase (APISO): 91 U/L (ref 37–153)
BUN: 12 mg/dL (ref 7–25)
CO2: 28 mmol/L (ref 20–32)
Calcium: 9.7 mg/dL (ref 8.6–10.4)
Chloride: 99 mmol/L (ref 98–110)
Creat: 0.93 mg/dL (ref 0.50–1.03)
Globulin: 3.5 g/dL (calc) (ref 1.9–3.7)
Glucose, Bld: 73 mg/dL (ref 65–99)
Potassium: 3.1 mmol/L — ABNORMAL LOW (ref 3.5–5.3)
Sodium: 139 mmol/L (ref 135–146)
Total Bilirubin: 0.6 mg/dL (ref 0.2–1.2)
Total Protein: 7.9 g/dL (ref 6.1–8.1)
eGFR: 75 mL/min/{1.73_m2} (ref >=60)

## 2022-06-23 LAB — GLUCOSE 6 PHOSPHATE DEHYDROGENASE: G-6PDH: 17.1 U/g Hgb (ref 7.0–20.5)

## 2022-06-23 MED ORDER — HYDROXYCHLOROQUINE SULFATE 200 MG PO TABS: ORAL_TABLET | ORAL | 2 refills | Status: DC

## 2022-06-23 NOTE — Telephone Encounter (Signed)
She will titrate her dose up to the maintenance dose of Plaquenil 200 mg twice daily Monday through Friday

## 2022-06-23 NOTE — Progress Notes (Signed)
G6PDH WNL.  Ok to initiate plaquenil.

## 2022-06-23 NOTE — Telephone Encounter (Signed)
-----   Message from Ofilia Neas, PA-C sent at 06/23/2022 11:26 AM EST ----- G6PDH WNL.  Ok to initiate plaquenil.

## 2022-06-24 ENCOUNTER — Ambulatory Visit (HOSPITAL_COMMUNITY): Payer: BC Managed Care – PPO | Admitting: Physical Therapy

## 2022-06-24 DIAGNOSIS — M5412 Radiculopathy, cervical region: Secondary | ICD-10-CM | POA: Diagnosis not present

## 2022-06-24 DIAGNOSIS — M6281 Muscle weakness (generalized): Secondary | ICD-10-CM | POA: Diagnosis not present

## 2022-06-24 NOTE — Therapy (Signed)
OUTPATIENT PHYSICAL THERAPY CERVICAL Treatment   Patient Name: [name] MRN: [id number] DOB:11-19-1970, 51 y.o., female Today's Date: 06/24/2022  END OF SESSION:   PT End of Session - 06/24/22 0908     Visit Number 6    Number of Visits 12    Date for PT Re-Evaluation 07/02/22    Authorization Type BCBS    Progress Note Due on Visit 10    PT Start Time 0905    PT Stop Time 0945    PT Time Calculation (min) 40 min    Activity Tolerance Patient tolerated treatment well    Behavior During Therapy [facility] for tasks assessed/performed                 Past Medical History:  Diagnosis Date   Back pain    BV (bacterial vaginosis) 09/16/2013   Chest pain 2000   right    Contraceptive management 09/16/2013   Encephalocele ([place]) 1994   Enlarged thyroid    HSV-2 (herpes simplex virus 2) infection    Hypertension    Irregular intermenstrual bleeding 04/27/2015   Obesity    Preterm labor    Sleep disorder    Thyroid nodule    Urinary frequency 12/14/2014   Vaginal discharge 09/16/2013   Past Surgical History:  Procedure Laterality Date   BREAST BIOPSY  2007   btl  [place] N/A 05/06/2016   Procedure: DILATATION & CURETTAGE/HYSTEROSCOPY WITH NOVASURE ABLATION;  Surgeon: Jonnie Kind, MD;  Location: [facility];  Service: Gynecology;  Laterality: N/A;  length-5; width-2.6; power-72 ; time- 1 minute 42 seconds   LAPAROSCOPIC BILATERAL SALPINGECTOMY Bilateral 05/06/2016   Procedure: LAPAROSCOPIC BILATERAL SALPINGECTOMY;  Surgeon: Jonnie Kind, MD;  Location: [facility];  Service: Gynecology;  Laterality: Bilateral;   RETAINED PLACENTA REMOVAL     TUBAL LIGATION     04/2016   tubal ligation reversal  2001   WISDOM TOOTH EXTRACTION     Patient Active Problem List   Diagnosis Date Noted   Acute gastroenteritis 05/05/2022   Generalized pain 05/05/2022   Elevated rheumatoid factor 05/05/2022   Adjustment reaction with  anxiety and depression 04/03/2022   Screen for STD (sexually transmitted disease) 02/05/2022   Acute low back pain without sciatica 11/28/2021   Cervical radiculopathy 11/28/2021   Benign skin mole 03/07/2020   Callus of foot 03/07/2020   Chronic superficial venous thrombosis of lower extremity 06/05/2019   Allergic reaction 12/07/2018   Multiple allergies 11/28/2015   Prediabetes 08/01/2012   Overweight (BMI 25.0-29.9) 08/01/2012   Vitamin D deficiency 07/28/2012   Allergic rhinitis 10/16/2009   Neck pain 03/17/2009   HYPOKALEMIA 03/14/2008   THYROID NODULE 10/26/2007   Essential hypertension 10/26/2007   Back pain with radiation 10/26/2007    PCP: Tula Nakayama  REFERRING PROVIDER: Vallarie Mare, MD  REFERRING DIAG:  Diagnosis Description  PT eval/tx for [phone] cervical spondylosis w/o myelopathy    THERAPY DIAG:  cervicalalgia  Rationale for Evaluation and Treatment: Rehabilitation  ONSET DATE: chronic since 2010 with acute exacerbation   SUBJECTIVE:  SUBJECTIVE STATEMENT: Pt states she woke with neck pain this morning, all on the Lt side and centrally located.      PERTINENT HISTORY:  Chronic back and neck pain, HTN  PAIN:  Are you having pain? Yes: Pain location: 4 left side of neck, into Rt upper trap and mid back  Pain description: aches  Aggravating factors: walking  Relieving factors: tylenol and heat   PRECAUTIONS: None  WEIGHT BEARING RESTRICTIONS: No  FALLS:  Has patient fallen in last 6 months? 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 Findings are most notably as follows.   At C5-C6, there is progressive mild-to-moderate disc degeneration with minimal degenerative endplate edema. Progressive disc bulge and bilateral uncovertebral hypertrophy. Superimposed small central disc protrusion. The disc bulge and disc protrusion efface the ventral thecal sac, contacting and minimally flattening the ventral aspect of the spinal cord. However, the dorsal CSF space is maintained within the spinal canal. Progressive bilateral neural foraminal narrowing (mild right, mild-to-moderate left).   No significant spinal canal stenosis at the remaining levels. Foraminal stenosis on the left at C6-C7 (moderate/severe) and on the left at C3-C4 (moderate).     Electronically Signed   By: Kellie Simmering D.O.   On: 04/26/2022 12:08  PATIENT SURVEYS:  FOTO 46    PALPATION: Noted tightness and mm spasm in B trapezius    CERVICAL ROM:   Active ROM A/PROM (deg) eval ARom  12/12  Flexion 30 reps  35  Extension 55 reps increase pain  55  Right lateral flexion 30 40  Left lateral flexion 20 30  Right rotation 45 80  Left rotation 40 70   (Blank rows = not tested) Cervical strength: Extension: 2- Rt side bend: 2- Lt side bend:2- Minimal exertion noted  UPPER EXTREMITY ROM: Functional with significant encouragement noted facial and verbal expression of pain.  Raising of arms is very slow which, in fact, would make this activity more difficult.   UPPER EXTREMITY MMT:  MMT Right eval 12/12/ Left eval 12/12/  Shoulder flexion _0 Shoulder extension      Shoulder abduction 3 3+ 3 4  Shoulder adduction      Shoulder extension      Shoulder internal rotation 3 4+ 3 4+  Shoulder external rotation _1 Middle trapezius      Lower trapezius      Elbow flexion 4  4   Elbow extension 4  4   Wrist flexion      Wrist  extension      Wrist ulnar deviation      Wrist radial deviation      Wrist pronation      Wrist supination      Grip strength _2 (Blank rows = not tested) All manual mm testing had a give way  Fast exchange grip strength is 35#, fast exchange should be less than grip strength. Fast exchange on 12/12:  35#  TODAY'S TREATMENT:  DATE:  06/24/22 Seated:  UBE 2/2 level 1 Standing:  wall push ups 10X  UE flexion against wall 10X  GTB rows 10X  GTB retractions 10X  GTB Extensions 10X Supine:  decompression with RtB 10X each Manual: seated to bil Upper trap/scap/scalenes  06/19/22: Standing: Postural exercise with green theraband: Scapular retraction x 10 Rows x 10 Shoulder extension x 10  [place] flexion with back to the wall with scapular retraction  Sitting:  W back with 2# x 15 X to V x 15 Money x 15 Trap stretch B 3 x 20"  Cervical isometric with SB and extension x 10 each 5" hold  Shoulder ER with green thera-band x 10 with 5" hold.   Scapular retraction with cervical retraction x 10  PNF I with red t-band B x 10 (sash) 06/17/22 Attempted manual to decrease pain but pt is intolerant. Supine: Scapular retraction x 10 Cervical retraction x 10 Sitting: Postural correction x 10 Cervical and thoracici excursions x 3  Isometric x 5 for SB and extension x5 Retro shoulder circles x 10  05/27/22 Seated:  scapular retractions 10X  3D Thoracic excursions with UE movements 5X each  3D cervical excursions 5X each  05/23/22 Reviewed HEP and goals Seated thoracic extension x10 Seated bil RTB ER x10   evaluation:    Seated Cervical Retraction  -3 - 3-5" hold - Seated Scapular Retraction  3 - 3-5" hold - Standing Cervical Retraction with Sidebending  3 - 10 reps - 3-5" hold - Seated Cervical Retraction and Rotation  3 - 3-5" hold - Seated  Transversus Abdominis Bracing  3 x 5" PATIENT EDUCATION:  Education details: HEP and the importance of good posture Person educated: Patient Education method: Explanation Education comprehension: returned demonstration  HOME EXERCISE PROGRAM:               Access Code: [email or web address] URL: [email or web address] Date: 05/21/2022 Prepared by: Rayetta Humphrey 06/19/22:  postural theraband exercises with green theraband for rows, scapular retraction and shoulder extension  12/12:  Cervical and thoracic excursions.   Exercises - Seated Cervical Retraction  - 2 x daily - 7 x weekly - 1 sets - 10 reps - 3-5" hold - Seated Scapular Retraction  - 2 x daily - 7 x weekly - 1 sets - 10 reps - 3-5" hold - Standing Cervical Retraction with Sidebending  - 2 x daily - 7 x weekly - 1 sets - 10 reps - 3-5" hold - Seated Cervical Retraction and Rotation  - 2 x daily - 7 x weekly - 1 sets - 10 reps - 3-5" hold - Seated Transversus Abdominis Bracing  - 4 x daily - 7 x weekly - 1 sets - 10 reps - 3" hold  ASSESSMENT:  CLINICAL IMPRESSION: Completed UBE for warm up with noted decrease in velocity after 2 minutes due to fatigue.  Continued with postural stabilization exercises in standing and using theraband.  Added supine decompression using red theraband with minimal cues and challenge.  Manual completed this session in seated with noted tightness and spasms in bil upper traps.  Pt able to tolerate manual this session and reported overall reduction in symptoms following. Encouraged to work more on her HEP and have family member complete manual when able. PT will continue to benefit from skilled therapy to reduce pain and improve overall functional tolerance.  OBJECTIVE IMPAIRMENTS: decreased activity tolerance, decreased ROM, decreased strength, increased fascial restrictions, increased muscle spasms, impaired UE functional use, and pain.  ACTIVITY LIMITATIONS: carrying, lifting, sitting, sleeping,  and reach over head  PARTICIPATION LIMITATIONS: meal prep, cleaning, laundry, shopping, community activity, and occupation  PERSONAL FACTORS: Behavior pattern and Time since onset of injury/illness/exacerbation are also affecting patient's functional outcome.   REHAB POTENTIAL: Fair    CLINICAL DECISION MAKING: Evolving/moderate complexity  EVALUATION COMPLEXITY: Moderate   GOALS: Goals reviewed with patient? No  SHORT TERM GOALS: Target date: 07/15/2022   PT to be I in HEP to increase cervcial ROM to at least 60 for safer driving.  Baseline:  Goal status: MET  2.  PT pain to be no greater than a 5/10  Baseline:  Goal status: IN PROGRESS  3.  PT strength of UE to increase 1/2 grade so that pt is able to raise arms above her head in a normal velocity without pain.  Baseline:  Goal status: MET    LONG TERM GOALS: Target date: 08/05/2022  PT to be I in an advanced stabilization exercise program to decrease pain to no greater than a 3/10 Baseline:  Goal status: IN PROGRESS  2.  PT UE strength to be increased 1 grade to allow improved functional use.  Baseline:  Goal status: MET  3.  Hand grip to increase to fast exchange rate of  35# for functional use  Baseline:  Goal status: INITIAL  4.  ROM of cervical spine to be to 70 to allow full scanning of enviornment.  Goal status: MET     PLAN:  PT FREQUENCY: 2x/week  PT DURATION: 6 weeks  PLANNED INTERVENTIONS: Therapeutic exercises, Therapeutic activity, Patient/Family education, Self Care, and Manual therapy  PLAN FOR NEXT SESSION: progress cervical stabilization and reduce pain/symptoms.   Teena Irani, PTA/CLT [place] Ph: [phone]   Teena Irani, PTA 06/24/2022, 10:04 AM

## 2022-06-25 ENCOUNTER — Telehealth: Payer: Self-pay | Admitting: Family Medicine

## 2022-06-25 ENCOUNTER — Encounter: Payer: Self-pay | Admitting: Family Medicine

## 2022-06-25 NOTE — Telephone Encounter (Signed)
Message left for Pt that I am tryig to contact her  and requesting a call back when mutually convenient to speak, I will also send a pt message

## 2022-06-25 NOTE — Telephone Encounter (Signed)
Patient return call. ?

## 2022-06-26 ENCOUNTER — Ambulatory Visit (HOSPITAL_COMMUNITY): Payer: BC Managed Care – PPO | Admitting: Physical Therapy

## 2022-06-26 DIAGNOSIS — M6281 Muscle weakness (generalized): Secondary | ICD-10-CM

## 2022-06-26 DIAGNOSIS — M5412 Radiculopathy, cervical region: Secondary | ICD-10-CM | POA: Diagnosis not present

## 2022-06-26 NOTE — Therapy (Signed)
OUTPATIENT PHYSICAL THERAPY CERVICAL Treatment   Patient Name: [name] MRN: [id number] DOB:08-16-1970, 51 y.o., female Today's Date: 06/26/2022  END OF SESSION:   PT End of Session - 06/26/22 0956     Visit Number 7    Number of Visits 12    Date for PT Re-Evaluation 07/02/22    Authorization Type BCBS    Progress Note Due on Visit 10    PT Start Time 0950    PT Stop Time 1030    PT Time Calculation (min) 40 min    Activity Tolerance Patient tolerated treatment well    Behavior During Therapy [facility] for tasks assessed/performed                 Past Medical History:  Diagnosis Date   Back pain    BV (bacterial vaginosis) 09/16/2013   Chest pain 2000   right    Contraceptive management 09/16/2013   Encephalocele ([place]) 1994   Enlarged thyroid    HSV-2 (herpes simplex virus 2) infection    Hypertension    Irregular intermenstrual bleeding 04/27/2015   Obesity    Preterm labor    Sleep disorder    Thyroid nodule    Urinary frequency 12/14/2014   Vaginal discharge 09/16/2013   Past Surgical History:  Procedure Laterality Date   BREAST BIOPSY  2007   btl  [place] N/A 05/06/2016   Procedure: DILATATION & CURETTAGE/HYSTEROSCOPY WITH NOVASURE ABLATION;  Surgeon: Jonnie Kind, MD;  Location: [facility];  Service: Gynecology;  Laterality: N/A;  length-5; width-2.6; power-72 ; time- 1 minute 42 seconds   LAPAROSCOPIC BILATERAL SALPINGECTOMY Bilateral 05/06/2016   Procedure: LAPAROSCOPIC BILATERAL SALPINGECTOMY;  Surgeon: Jonnie Kind, MD;  Location: [facility];  Service: Gynecology;  Laterality: Bilateral;   RETAINED PLACENTA REMOVAL     TUBAL LIGATION     04/2016   tubal ligation reversal  2001   WISDOM TOOTH EXTRACTION     Patient Active Problem List   Diagnosis Date Noted   Acute gastroenteritis 05/05/2022   Generalized pain 05/05/2022   Elevated rheumatoid factor 05/05/2022   Adjustment reaction with  anxiety and depression 04/03/2022   Screen for STD (sexually transmitted disease) 02/05/2022   Acute low back pain without sciatica 11/28/2021   Cervical radiculopathy 11/28/2021   Benign skin mole 03/07/2020   Callus of foot 03/07/2020   Chronic superficial venous thrombosis of lower extremity 06/05/2019   Allergic reaction 12/07/2018   Multiple allergies 11/28/2015   Prediabetes 08/01/2012   Overweight (BMI 25.0-29.9) 08/01/2012   Vitamin D deficiency 07/28/2012   Allergic rhinitis 10/16/2009   Neck pain 03/17/2009   HYPOKALEMIA 03/14/2008   THYROID NODULE 10/26/2007   Essential hypertension 10/26/2007   Back pain with radiation 10/26/2007    PCP: Tula Nakayama  REFERRING PROVIDER: Vallarie Mare, MD  REFERRING DIAG:  Diagnosis Description  PT eval/tx for [phone] cervical spondylosis w/o myelopathy    THERAPY DIAG:  cervicalalgia  Rationale for Evaluation and Treatment: Rehabilitation  ONSET DATE: chronic since 2010 with acute exacerbation   SUBJECTIVE:  SUBJECTIVE STATEMENT: Pt reports it is about the same, hurting in that one spot (pointing to Rt upper trap).  States her Rt arm "is about done for the day".  Currently 7/ pain.      PERTINENT HISTORY:  Chronic back and neck pain, HTN  PAIN:  Are you having pain? Yes: Pain location: 7  Right side of neck  Pain description: aches  Aggravating factors: walking  Relieving factors: tylenol and heat   PRECAUTIONS: None  WEIGHT BEARING RESTRICTIONS: No  FALLS:  Has patient fallen in last 6 months? 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 Findings are most notably as follows.   At C5-C6, there is progressive mild-to-moderate disc degeneration with minimal degenerative endplate edema. Progressive disc bulge and bilateral uncovertebral hypertrophy. Superimposed small central disc protrusion. The disc bulge and disc protrusion efface the ventral thecal sac, contacting and minimally flattening the ventral aspect of the spinal cord. However, the dorsal CSF space is maintained within the spinal canal. Progressive bilateral neural foraminal narrowing (mild right, mild-to-moderate left).   No significant spinal canal stenosis at the remaining levels. Foraminal stenosis on the left at C6-C7 (moderate/severe) and on the left at C3-C4 (moderate).     Electronically Signed   By: Kyle  Golden D.O.   On: 04/26/2022 12:08  PATIENT SURVEYS:  FOTO 46    PALPATION: Noted tightness and mm spasm in B trapezius    CERVICAL ROM:   Active ROM A/PROM (deg) eval ARom  12/12  Flexion 30 reps  35  Extension 55 reps increase pain  55  Right lateral flexion 30 40  Left lateral flexion 20 30  Right rotation 45 80  Left rotation 40 70   (Blank rows = not tested) Cervical strength: Extension: 2- Rt side bend: 2- Lt side bend:2- Minimal exertion noted  UPPER EXTREMITY ROM: Functional with significant encouragement noted facial and verbal expression of pain.  Raising of arms is very slow which, in fact, would make this activity more difficult.   UPPER EXTREMITY MMT:  MMT Right eval 12/12/ Left eval 12/12/  Shoulder flexion 3 4 3 4  Shoulder extension      Shoulder abduction 3 3+ 3 4  Shoulder adduction      Shoulder extension      Shoulder internal rotation 3 4+ 3 4+  Shoulder external rotation 3 4 3 4  Middle trapezius      Lower trapezius      Elbow flexion 4  4   Elbow extension 4  4    Wrist flexion      Wrist extension      Wrist ulnar deviation      Wrist radial deviation      Wrist pronation      Wrist supination      Grip strength 13 25 10 17   (Blank rows = not tested) All manual mm testing had a give way  Fast exchange grip strength is 35#, fast exchange should be less than grip strength. Fast exchange on 12/12:  35#  TODAY'S TREATMENT:                                                                                                                                DATE:  06/26/22 Seated:  UBE 1/1 level 1 Standing:  wall push ups 10X  UE flexion against wall 10X  GTB rows 10X2  GTB retractions 10X2  GTB Extensions 10X2  Corner stretch 3X20" Manual: seated to bil Upper trap/scap/scalenes Rt>Lt  06/24/22 Seated:  UBE 2/2 level 1 Standing:  wall push ups 10X  UE flexion against wall 10X  GTB rows 10X  GTB retractions 10X  GTB Extensions 10X Supine:  decompression with RtB 10X each Manual: seated to bil Upper trap/scap/scalenes  06/19/22: Standing: Postural exercise with green theraband: Scapular retraction x 10 Rows x 10 Shoulder extension x 10  US flexion with back to the wall with scapular retraction  Sitting:  W back with 2# x 15 X to V x 15 Money x 15 Trap stretch B 3 x 20"  Cervical isometric with SB and extension x 10 each 5" hold  Shoulder ER with green thera-band x 10 with 5" hold.   Scapular retraction with cervical retraction x 10  PNF I with red t-band B x 10 (sash)  06/17/22 Attempted manual to decrease pain but pt is intolerant. Supine: Scapular retraction x 10 Cervical retraction x 10 Sitting: Postural correction x 10 Cervical and thoracici excursions x 3  Isometric x 5 for SB and extension x5 Retro shoulder circles x 10  05/27/22 Seated:  scapular retractions 10X  3D Thoracic excursions with UE movements 5X each  3D cervical excursions 5X each  05/23/22 Reviewed HEP and goals Seated thoracic extension x10 Seated  bil RTB ER x10  evaluation:    Seated Cervical Retraction  -3 - 3-5" hold - Seated Scapular Retraction  3 - 3-5" hold - Standing Cervical Retraction with Sidebending  3 - 10 reps - 3-5" hold - Seated Cervical Retraction and Rotation  3 - 3-5" hold - Seated Transversus Abdominis Bracing  3 x 5"  PATIENT EDUCATION:  Education details: HEP and the importance of good posture Person educated: Patient Education method: Explanation Education comprehension: returned demonstration  HOME EXERCISE PROGRAM:               Access Code: [email or web address] URL: [email or web address] Date: 05/21/2022 Prepared by: Cynthia Russell 06/19/22:  postural theraband exercises with green theraband for rows, scapular retraction and shoulder extension  12/12:  Cervical and thoracic excursions.   Exercises - Seated Cervical Retraction  - 2 x daily - 7 x weekly - 1 sets - 10 reps - 3-5" hold - Seated Scapular Retraction  - 2 x daily - 7 x weekly - 1 sets - 10 reps - 3-5" hold - Standing Cervical Retraction with Sidebending  - 2 x daily - 7 x weekly - 1 sets - 10 reps - 3-5" hold - Seated Cervical Retraction and Rotation  - 2 x daily - 7 x weekly - 1 sets - 10 reps - 3-5" hold - Seated Transversus Abdominis Bracing  - 4 x daily - 7 x weekly - 1 sets - 10 reps - 3" hold  ASSESSMENT:  CLINICAL IMPRESSION: Only able to complete 1 minute on UBE before Rt UE "gave out" and stated she could'nt complete anymore.  States she also has difficulty grasping her mouse at work.  Continued with postural stabilization exercises in standing and using theraband with additional set added this session.  Added corner stretch to address tight chest mm.  Pt with no difficulties completing all other established exercises other than UBE.   Manual completed with much less tightness noted this session   as compared to last.  Bilaterally proportionate with overall reduction in symptoms at end of session. PT will continue to benefit from  skilled therapy to reduce pain and improve overall functional tolerance.  OBJECTIVE IMPAIRMENTS: decreased activity tolerance, decreased ROM, decreased strength, increased fascial restrictions, increased muscle spasms, impaired UE functional use, and pain.   ACTIVITY LIMITATIONS: carrying, lifting, sitting, sleeping, and reach over head  PARTICIPATION LIMITATIONS: meal prep, cleaning, laundry, shopping, community activity, and occupation  PERSONAL FACTORS: Behavior pattern and Time since onset of injury/illness/exacerbation are also affecting patient's functional outcome.   REHAB POTENTIAL: Fair    CLINICAL DECISION MAKING: Evolving/moderate complexity  EVALUATION COMPLEXITY: Moderate   GOALS: Goals reviewed with patient? No  SHORT TERM GOALS: Target date: 07/17/2022   PT to be I in HEP to increase cervcial ROM to at least 60 for safer driving.  Baseline:  Goal status: MET  2.  PT pain to be no greater than a 5/10  Baseline:  Goal status: IN PROGRESS  3.  PT strength of UE to increase 1/2 grade so that pt is able to raise arms above her head in a normal velocity without pain.  Baseline:  Goal status: MET    LONG TERM GOALS: Target date: 08/07/2022  PT to be I in an advanced stabilization exercise program to decrease pain to no greater than a 3/10 Baseline:  Goal status: IN PROGRESS  2.  PT UE strength to be increased 1 grade to allow improved functional use.  Baseline:  Goal status: MET  3.  Hand grip to increase to fast exchange rate of  35# for functional use  Baseline:  Goal status: INITIAL  4.  ROM of cervical spine to be to 70 to allow full scanning of enviornment.  Goal status: MET     PLAN:  PT FREQUENCY: 2x/week  PT DURATION: 6 weeks  PLANNED INTERVENTIONS: Therapeutic exercises, Therapeutic activity, Patient/Family education, Self Care, and Manual therapy  PLAN FOR NEXT SESSION: progress cervical stabilization and reduce pain/symptoms.   Teena Irani, PTA/CLT [place] [facility] Ph: [phone]   Teena Irani, PTA 06/26/2022, 10:19 AM

## 2022-07-01 ENCOUNTER — Other Ambulatory Visit (HOSPITAL_COMMUNITY): Payer: Self-pay | Admitting: Family Medicine

## 2022-07-01 DIAGNOSIS — Z1231 Encounter for screening mammogram for malignant neoplasm of breast: Secondary | ICD-10-CM

## 2022-07-02 ENCOUNTER — Ambulatory Visit (HOSPITAL_COMMUNITY): Payer: BC Managed Care – PPO | Admitting: Physical Therapy

## 2022-07-02 DIAGNOSIS — M5412 Radiculopathy, cervical region: Secondary | ICD-10-CM

## 2022-07-02 DIAGNOSIS — M6281 Muscle weakness (generalized): Secondary | ICD-10-CM

## 2022-07-02 NOTE — Telephone Encounter (Signed)
I called patient, patient advised Dr. Estanislado Pandy will not return to the office until July 08, 2022. Patient advised to have doctor who is currently completing disability paper work to complete disability paper work for her.

## 2022-07-02 NOTE — Therapy (Signed)
OUTPATIENT PHYSICAL THERAPY CERVICAL Treatment Progress Note Reporting Period 05/21/22 to 06/2722  See note below for Objective Data and Assessment of Progress/Goals.      Patient Name: [name] MRN: [id number] DOB:1971-04-15, 51 y.o., female Today's Date: 06/26/2022  END OF SESSION:   PT End of Session - 06/26/22 0956     Visit Number 7    Number of Visits 12    Date for PT Re-Evaluation 07/21/21   Authorization Type BCBS    Progress Note Due on Visit 12    PT Start Time 0950    PT Stop Time 1030    PT Time Calculation (min) 40 min    Activity Tolerance Patient tolerated treatment well    Behavior During Therapy [facility] for tasks assessed/performed                 Past Medical History:  Diagnosis Date   Back pain    BV (bacterial vaginosis) 09/16/2013   Chest pain 2000   right    Contraceptive management 09/16/2013   Encephalocele ([place]) 1994   Enlarged thyroid    HSV-2 (herpes simplex virus 2) infection    Hypertension    Irregular intermenstrual bleeding 04/27/2015   Obesity    Preterm labor    Sleep disorder    Thyroid nodule    Urinary frequency 12/14/2014   Vaginal discharge 09/16/2013   Past Surgical History:  Procedure Laterality Date   BREAST BIOPSY  2007   btl  [place] N/A 05/06/2016   Procedure: DILATATION & CURETTAGE/HYSTEROSCOPY WITH NOVASURE ABLATION;  Surgeon: Jonnie Kind, MD;  Location: [facility];  Service: Gynecology;  Laterality: N/A;  length-5; width-2.6; power-72 ; time- 1 minute 42 seconds   LAPAROSCOPIC BILATERAL SALPINGECTOMY Bilateral 05/06/2016   Procedure: LAPAROSCOPIC BILATERAL SALPINGECTOMY;  Surgeon: Jonnie Kind, MD;  Location: [facility];  Service: Gynecology;  Laterality: Bilateral;   RETAINED PLACENTA REMOVAL     TUBAL LIGATION     04/2016   tubal ligation reversal  2001   WISDOM TOOTH EXTRACTION     Patient Active Problem List   Diagnosis Date Noted   Acute  gastroenteritis 05/05/2022   Generalized pain 05/05/2022   Elevated rheumatoid factor 05/05/2022   Adjustment reaction with anxiety and depression 04/03/2022   Screen for STD (sexually transmitted disease) 02/05/2022   Acute low back pain without sciatica 11/28/2021   Cervical radiculopathy 11/28/2021   Benign skin mole 03/07/2020   Callus of foot 03/07/2020   Chronic superficial venous thrombosis of lower extremity 06/05/2019   Allergic reaction 12/07/2018   Multiple allergies 11/28/2015   Prediabetes 08/01/2012   Overweight (BMI 25.0-29.9) 08/01/2012   Vitamin D deficiency 07/28/2012   Allergic rhinitis 10/16/2009   Neck pain 03/17/2009   HYPOKALEMIA 03/14/2008   THYROID NODULE 10/26/2007   Essential hypertension 10/26/2007   Back pain with radiation 10/26/2007    PCP: Tula Nakayama  REFERRING PROVIDER: Vallarie Mare, MD  REFERRING DIAG:  Diagnosis Description  PT eval/tx for [phone] cervical spondylosis w/o myelopathy    THERAPY DIAG:  cervicalalgia  Rationale for Evaluation and Treatment: Rehabilitation  ONSET DATE: chronic since 2010 with acute exacerbation   SUBJECTIVE:  SUBJECTIVE STATEMENT: Pt states that her hand is still weak.  She is doing her exercise one time a week.  States that her RT arm/hand has had increased pain in the past week.  States that she can not even move the mouse on her computer.   PERTINENT HISTORY:  Chronic back and neck pain, HTN  PAIN:  Are you having pain? Yes: Pain location: 4  Right side of neck  Pain description: aches  Aggravating factors: walking  Relieving factors: tylenol and heat   PRECAUTIONS: None  WEIGHT BEARING RESTRICTIONS: No  FALLS:  Has patient fallen in last 6 months? 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 Findings are most notably as follows.   At C5-C6, there is progressive mild-to-moderate disc degeneration with minimal degenerative endplate edema. Progressive disc bulge and bilateral uncovertebral hypertrophy. Superimposed small central disc protrusion. The disc bulge and disc protrusion efface the ventral thecal sac, contacting and minimally flattening the ventral aspect of the spinal cord. However, the dorsal CSF space is maintained within the spinal canal. Progressive bilateral neural foraminal narrowing (mild right, mild-to-moderate left).   No significant spinal canal stenosis at the remaining levels. Foraminal stenosis on the left at C6-C7 (moderate/severe) and on the left at C3-C4 (moderate).     Electronically Signed   By: Kellie Simmering D.O.   On: 04/26/2022 12:08  PATIENT SURVEYS:  FOTO 46    PALPATION: Noted tightness and mm spasm in B trapezius    CERVICAL ROM:   Active ROM A/PROM (deg) eval ARom  12/12 AROM 07/02/22  Flexion 30 reps  35 40  Extension 55 reps increase pain  55 60  Right lateral flexion 30 40 45  Left lateral flexion 20 30 35  Right rotation 45 80 80  Left rotation 40 70 75   (Blank rows = not tested) Cervical strength: Extension: 2- Rt side bend: 2- Lt side bend:2- 07/02/22: Rt side bend:  3 Lt side bend:  3 Extension:      3 Minimal exertion noted  UPPER EXTREMITY ROM: Functional with significant encouragement noted facial and verbal expression of pain.  Raising of arms is very slow which, in fact, would make this activity more difficult.  12/27 UE ROM WFL and less painful for pt   UPPER EXTREMITY MMT:  MMT Right eval  12/12/ 07/02/22 Left eval 12/12/ 07/02/22  Shoulder flexion 3 4 3+ _0 Shoulder extension        Shoulder abduction 3 3+ 3+ _1 Shoulder adduction        Shoulder extension        Shoulder internal rotation 3 4+ 3+ 3 4+ 4  Shoulder external rotation 3 4 3+ _2 Middle trapezius        Lower trapezius        Elbow flexion 4   4    Elbow extension 4   4    Wrist flexion        Wrist extension        Wrist ulnar deviation        Wrist radial deviation        Wrist pronation  Wrist supination        Grip strength _0 (Blank rows = not tested)  All manual mm testing had a give way  Fast exchange grip strength is 35#, fast exchange should be less than grip strength. Fast exchange on 12/12:  35#                        Fast exchange on 12/27:  RT: 30; Lt 30, fast exchange should be less than static.   TODAY'S TREATMENT:                                                                                                                               DATE:  07/02/22:  Sitting: UE : Flexion x 5 Abduction x 5 IR/ER x 5  Cervical isometric x 5 each  Theraputty x 10 given for HEP Standing: Wall push up   06/26/22 Seated:  UBE 1/1 level 1 Standing:  wall push ups 10X  UE flexion against wall 10X  GTB rows 10X2  GTB retractions 10X2  GTB Extensions 10X2  Corner stretch 3X20" Manual: seated to bil Upper trap/scap/scalenes Rt>Lt  06/24/22 Seated:  UBE 2/2 level 1 Standing:  wall push ups 10X  UE flexion against wall 10X  GTB rows 10X  GTB retractions 10X  GTB Extensions 10X Supine:  decompression with RtB 10X each Manual: seated to bil Upper trap/scap/scalenes  06/19/22: Standing: Postural exercise with green theraband: Scapular retraction x 10 Rows x 10 Shoulder extension x 10  [place] flexion with back to the wall with scapular retraction  Sitting:  W back with 2# x 15 X to V x 15 Money x 15 Trap stretch B 3 x 20"  Cervical isometric with SB  and extension x 10 each 5" hold  Shoulder ER with green thera-band x 10 with 5" hold.   Scapular retraction with cervical retraction x 10  PNF I with red t-band B x 10 (sash)  06/17/22 Attempted manual to decrease pain but pt is intolerant. Supine: Scapular retraction x 10 Cervical retraction x 10 Sitting: Postural correction x 10 Cervical and thoracici excursions x 3  Isometric x 5 for SB and extension x5 Retro shoulder circles x 10  05/27/22 Seated:  scapular retractions 10X  3D Thoracic excursions with UE movements 5X each  3D cervical excursions 5X each  05/23/22 Reviewed HEP and goals Seated thoracic extension x10 Seated bil RTB ER x10  evaluation:    Seated Cervical Retraction  -3 - 3-5" hold - Seated Scapular Retraction  3 - 3-5" hold - Standing Cervical Retraction with Sidebending  3 - 10 reps - 3-5" hold - Seated Cervical Retraction and Rotation  3 - 3-5" hold - Seated Transversus Abdominis Bracing  3 x 5"  PATIENT EDUCATION:  Education details: HEP and the importance of good posture Person educated: Patient Education method: Explanation Education comprehension:  returned demonstration  HOME EXERCISE PROGRAM:               Access Code: URL: [email or web address] Date: 05/21/2022 Prepared by: Caren Griffins RussellAccess Code: [email or web address] URL: [email or web address] Date: 07/02/2022 Prepared by: Rayetta Humphrey  Exercises - Seated Isometric Cervical Sidebending  - 2 x daily - 7 x weekly - 1 sets - 10 reps - 5-10" hold - Seated Isometric Cervical Extension  - 2 x daily - 7 x weekly - 1 sets - 10 reps - 5-10" hold 06/22/22  06/19/22:  postural theraband exercises with green theraband for rows, scapular retraction and shoulder extension  12/12:  Cervical and thoracic excursions.   Exercises - Seated Cervical Retraction  - 2 x daily - 7 x weekly - 1 sets - 10 reps - 3-5" hold - Seated Scapular Retraction  - 2 x daily - 7 x weekly - 1 sets -  10 reps - 3-5" hold - Standing Cervical Retraction with Sidebending  - 2 x daily - 7 x weekly - 1 sets - 10 reps - 3-5" hold - Seated Cervical Retraction and Rotation  - 2 x daily - 7 x weekly - 1 sets - 10 reps - 3-5" hold - Seated Transversus Abdominis Bracing  - 4 x daily - 7 x weekly - 1 sets - 10 reps - 3" hold  ASSESSMENT:  CLINICAL IMPRESSION: PT reassessed with ROM wfl for both cervical and UE at this time; Pt continues to have deficits in her strength.  Therapist encouraged pt to complete her exercises everyday. Manual completed with much only one mm spasm noted in RT Upper trap none in Lt.   PT will continue to benefit from skilled therapy to reduce pain and improve overall strength.  OBJECTIVE IMPAIRMENTS: decreased activity tolerance, decreased ROM, decreased strength, increased fascial restrictions, increased muscle spasms, impaired UE functional use, and pain.   ACTIVITY LIMITATIONS: carrying, lifting, sitting, sleeping, and reach over head  PARTICIPATION LIMITATIONS: meal prep, cleaning, laundry, shopping, community activity, and occupation  PERSONAL FACTORS: Behavior pattern and Time since onset of injury/illness/exacerbation are also affecting patient's functional outcome.   REHAB POTENTIAL: Fair    CLINICAL DECISION MAKING: Evolving/moderate complexity  EVALUATION COMPLEXITY: Moderate   GOALS: Goals reviewed with patient? No  SHORT TERM GOALS: Target date: 07/17/2022   PT to be I in HEP to increase cervcial ROM to at least 60 for safer driving.  Baseline:  Goal status: MET  2.  PT pain to be no greater than a 5/10  Baseline:  Goal status: IN PROGRESS  3.  PT strength of UE to increase 1/2 grade so that pt is able to raise arms above her head in a normal velocity without pain.  Baseline:  Goal status: MET    LONG TERM GOALS: Target date: 08/07/2022  PT to be I in an advanced stabilization exercise program to decrease pain to no greater than a 3/10 Baseline:   Goal status: IN PROGRESS  2.  PT UE strength to be increased 1 grade to allow improved functional use.  Baseline:  Goal status: MET  3.  Hand grip to increase to fast exchange rate of  35# for functional use  Baseline:  Goal status: INITIAL  4.  ROM of cervical spine to be to 70 to allow full scanning of enviornment.  Goal status: MET     PLAN:Due to [employer] pt cert ran out, continue to complete the full 12 visits of treatment then D/C to HEP  PT FREQUENCY: 2x/week  PT DURATION: 6 weeks  PLANNED INTERVENTIONS: Therapeutic exercises, Therapeutic activity, Patient/Family education, Self Care, and Manual therapy  PLAN FOR NEXT SESSION: progress cervical stabilization/UE strength and reduce pain/symptoms.   Rayetta Humphrey, [place] CLT [phone]

## 2022-07-04 ENCOUNTER — Ambulatory Visit (HOSPITAL_COMMUNITY): Payer: BC Managed Care – PPO | Admitting: Physical Therapy

## 2022-07-04 DIAGNOSIS — M6281 Muscle weakness (generalized): Secondary | ICD-10-CM | POA: Diagnosis not present

## 2022-07-04 DIAGNOSIS — M5412 Radiculopathy, cervical region: Secondary | ICD-10-CM | POA: Diagnosis not present

## 2022-07-04 NOTE — Therapy (Signed)
OUTPATIENT PHYSICAL THERAPY CERVICAL Treatment  Patient Name: [name] MRN: [id number] DOB:Apr 22, 1971, 51 y.o., female Today's Date: 07/04/2022  END OF SESSION:   PT End of Session - 07/04/22 0953     Visit Number 9    Number of Visits 12    Date for PT Re-Evaluation 07/02/22    Authorization Type [employer]    Progress Note Due on Visit 12   PT Start Time [phone]    PT Stop Time 1031   PT Time Calculation (min) 38 min    Activity Tolerance Patient tolerated treatment well    Behavior During Therapy [facility] for tasks assessed/performed                Past Medical History:  Diagnosis Date   Back pain    BV (bacterial vaginosis) 09/16/2013   Chest pain 2000   right    Contraceptive management 09/16/2013   Encephalocele ([place]) 1994   Enlarged thyroid    HSV-2 (herpes simplex virus 2) infection    Hypertension    Irregular intermenstrual bleeding 04/27/2015   Obesity    Preterm labor    Sleep disorder    Thyroid nodule    Urinary frequency 12/14/2014   Vaginal discharge 09/16/2013   Past Surgical History:  Procedure Laterality Date   BREAST BIOPSY  2007   btl  [place] N/A 05/06/2016   Procedure: DILATATION & CURETTAGE/HYSTEROSCOPY WITH NOVASURE ABLATION;  Surgeon: Jonnie Kind, MD;  Location: [facility];  Service: Gynecology;  Laterality: N/A;  length-5; width-2.6; power-72 ; time- 1 minute 42 seconds   LAPAROSCOPIC BILATERAL SALPINGECTOMY Bilateral 05/06/2016   Procedure: LAPAROSCOPIC BILATERAL SALPINGECTOMY;  Surgeon: Jonnie Kind, MD;  Location: [facility];  Service: Gynecology;  Laterality: Bilateral;   RETAINED PLACENTA REMOVAL     TUBAL LIGATION     04/2016   tubal ligation reversal  2001   WISDOM TOOTH EXTRACTION     Patient Active Problem List   Diagnosis Date Noted   Acute gastroenteritis 05/05/2022   Generalized pain 05/05/2022   Elevated rheumatoid factor 05/05/2022   Adjustment reaction with anxiety and  depression 04/03/2022   Screen for STD (sexually transmitted disease) 02/05/2022   Acute low back pain without sciatica 11/28/2021   Cervical radiculopathy 11/28/2021   Benign skin mole 03/07/2020   Callus of foot 03/07/2020   Chronic superficial venous thrombosis of lower extremity 06/05/2019   Allergic reaction 12/07/2018   Multiple allergies 11/28/2015   Prediabetes 08/01/2012   Overweight (BMI 25.0-29.9) 08/01/2012   Vitamin D deficiency 07/28/2012   Allergic rhinitis 10/16/2009   Neck pain 03/17/2009   HYPOKALEMIA 03/14/2008   THYROID NODULE 10/26/2007   Essential hypertension 10/26/2007   Back pain with radiation 10/26/2007    PCP: Tula Nakayama  REFERRING PROVIDER: Vallarie Mare, MD  REFERRING DIAG:  Diagnosis Description  PT eval/tx for [phone] cervical spondylosis w/o myelopathy    THERAPY DIAG:  cervicalalgia  Rationale for Evaluation and Treatment: Rehabilitation  ONSET DATE: chronic since 2010 with acute exacerbation   SUBJECTIVE:  SUBJECTIVE STATEMENT: Pt states that she has did her exercises yesterday.  She did her putty as well.   PERTINENT HISTORY:  Chronic back and neck pain, HTN  PAIN:  Are you having pain? Yes: Pain location: 2  Right side of neck  Pain description: aches  Aggravating factors: walking  Relieving factors: tylenol and heat   PRECAUTIONS: None  WEIGHT BEARING RESTRICTIONS: No  FALLS:  Has patient fallen in last 6 months? 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 Findings are most notably as follows.   At C5-C6, there is progressive mild-to-moderate disc degeneration with minimal degenerative endplate edema. Progressive disc bulge and bilateral uncovertebral hypertrophy. Superimposed small central disc protrusion. The disc bulge and disc protrusion efface the ventral thecal sac, contacting and minimally flattening the ventral aspect of the spinal cord. However, the dorsal CSF space is maintained within the spinal canal. Progressive bilateral neural foraminal narrowing (mild right, mild-to-moderate left).   No significant spinal canal stenosis at the remaining levels. Foraminal stenosis on the left at C6-C7 (moderate/severe) and on the left at C3-C4 (moderate).     Electronically Signed   By: Kellie Simmering D.O.   On: 04/26/2022 12:08  PATIENT SURVEYS:  FOTO 46    PALPATION: Noted tightness and mm spasm in B trapezius    CERVICAL ROM:   Active ROM A/PROM (deg) eval ARom  12/12 AROM 07/02/22  Flexion 30 reps  35 40  Extension 55 reps increase pain  55 60  Right lateral flexion 30 40 45  Left lateral flexion 20 30 35  Right rotation 45 80 80  Left rotation 40 70 75   (Blank rows = not tested) Cervical strength: Extension: 2- Rt side bend: 2- Lt side bend:2- 07/02/22: Rt side bend:  3 Lt side bend:  3 Extension:      3 Minimal exertion noted  UPPER EXTREMITY ROM: Functional with significant encouragement noted facial and verbal expression of pain.  Raising of arms is very slow which, in fact, would make this activity more difficult.  12/27 UE ROM WFL and less painful for pt   UPPER EXTREMITY MMT:  MMT Right eval 12/12/ 07/02/22 Left eval 12/12/ 07/02/22  Shoulder flexion 3 4 3+ _0 Shoulder extension        Shoulder abduction 3 3+ 3+ _1 Shoulder adduction        Shoulder extension        Shoulder internal rotation 3 4+ 3+ 3 4+ 4  Shoulder external rotation 3 4  3+ _2 Middle trapezius        Lower trapezius        Elbow flexion 4   4    Elbow extension 4   4    Wrist flexion        Wrist extension        Wrist ulnar deviation        Wrist radial deviation        Wrist pronation        Wrist supination        Grip strength _3 (Blank rows = not tested)  All manual mm testing had a give way  Fast exchange grip strength is 35#, fast exchange should be less than grip strength. Fast exchange on 12/12:  35#                        Fast exchange on 12/27:  RT: 30; Lt 30, fast exchange should be less than static.   TODAY'S TREATMENT:                                                                                                                               DATE:  07/04/22 Hand squeeze black picking up beads 12 B UE x 1  Hand grip white resistance in 4th  x 10 B  Wall push up x 10 Theraband green Scapular retraction x15 Rows x 15 Shoulder extension x 15  Manual to include pressure, efflurage and pettrisage to decrease spasm and pain.   07/02/22:  Sitting: UE : Flexion x 5 Abduction x 5 IR/ER x 5  Cervical isometric x 5 each  Theraputty x 10 given for HEP Standing: Wall push up   06/26/22 Seated:  UBE 1/1 level 1 Standing:  wall push ups 10X  UE flexion against wall 10X  GTB rows 10X2  GTB retractions 10X2  GTB Extensions 10X2  Corner stretch 3X20"           PNF D1/ D2 B            Anti-rotation x 10  Manual: seated to bil Upper trap/scap/scalenes Rt>Lt  06/24/22 Seated:  UBE 2/2 level 1 Standing:  wall push ups 10X  UE flexion against wall 10X  GTB rows 10X  GTB retractions 10X  GTB Extensions 10X Supine:  decompression with RtB 10X each Manual: seated to bil Upper trap/scap/scalenes  06/19/22: Standing: Postural exercise with green theraband: Scapular retraction x 10 Rows x 10 Shoulder extension x 10  [place] flexion with back to the wall with scapular retraction  Sitting:  W back with 2# x 15 X  to V x 15 Money x 15 Trap stretch B 3 x 20"  Cervical isometric with SB and extension x 10 each 5" hold  Shoulder ER with green thera-band x 10 with 5" hold.   Scapular retraction with cervical retraction x 10  PNF I with red t-band B x 10 (sash)  06/17/22 Attempted manual to decrease pain but pt is intolerant. Supine: Scapular retraction x 10 Cervical retraction x 10 Sitting: Postural correction x 10 Cervical and thoracici excursions x 3  Isometric x 5 for SB and extension x5 Retro shoulder circles x 10  05/27/22 Seated:  scapular retractions 10X  3D Thoracic excursions with UE movements 5X each  3D cervical excursions 5X each  05/23/22 Reviewed HEP and goals Seated thoracic extension x10 Seated bil RTB ER x10  evaluation:    Seated Cervical Retraction  -3 - 3-5" hold - Seated Scapular Retraction  3 - 3-5" hold - Standing Cervical Retraction with Sidebending  3 - 10 reps - 3-5" hold - Seated Cervical Retraction and Rotation  3 - 3-5" hold - Seated Transversus Abdominis Bracing  3 x 5"  PATIENT EDUCATION:  Education details: HEP and the importance of good posture Person educated: Patient Education method: Explanation Education comprehension: returned demonstration  HOME EXERCISE PROGRAM:               Access Code: URL: [email or web address] Date: 05/21/2022 Prepared by: Caren Griffins RussellAccess Code: [email or web address] URL: [email or web address] Date: 07/02/2022 Prepared by: Rayetta Humphrey  Exercises - Seated Isometric Cervical Sidebending  - 2 x daily - 7 x weekly - 1 sets - 10 reps - 5-10" hold - Seated Isometric Cervical Extension  - 2 x daily - 7 x weekly - 1 sets - 10 reps - 5-10" hold 06/22/22  06/19/22:  postural theraband exercises with green theraband for rows, scapular retraction and shoulder extension  12/12:  Cervical and thoracic excursions.   Exercises - Seated Cervical Retraction  - 2 x daily - 7 x weekly - 1 sets - 10 reps -  3-5" hold - Seated Scapular Retraction  - 2 x daily - 7 x weekly - 1 sets - 10 reps - 3-5" hold - Standing Cervical Retraction with Sidebending  - 2 x daily - 7 x weekly - 1 sets - 10 reps - 3-5" hold - Seated Cervical Retraction and Rotation  - 2 x daily - 7 x weekly - 1 sets - 10 reps - 3-5" hold - Seated Transversus Abdominis Bracing  - 4 x daily - 7 x weekly - 1 sets - 10 reps - 3" hold  ASSESSMENT:  CLINICAL IMPRESSION:  Added hand strengthening exercises to program with minimal difficulty.  Pt exhibits improved form with exercises.   PT will continue to benefit from skilled therapy to reduce pain and improve overall strength.    OBJECTIVE IMPAIRMENTS: decreased activity tolerance, decreased ROM, decreased strength, increased fascial restrictions, increased muscle spasms, impaired UE functional use, and pain.   ACTIVITY LIMITATIONS: carrying, lifting, sitting, sleeping, and reach over head  PARTICIPATION LIMITATIONS: meal prep, cleaning, laundry, shopping, community activity, and occupation  PERSONAL FACTORS: Behavior pattern and Time since onset of injury/illness/exacerbation are also affecting patient's functional outcome.   REHAB POTENTIAL: Fair    CLINICAL DECISION MAKING: Evolving/moderate complexity  EVALUATION COMPLEXITY: Moderate   GOALS: Goals reviewed with patient? No  SHORT TERM GOALS: Target date: 07/25/2022   PT to be I in HEP to increase cervcial ROM to at least 60 for safer driving.  Baseline:  Goal status: MET  2.  PT pain to be no greater than a 5/10  Baseline:  Goal status: IN PROGRESS  3.  PT strength of UE to increase 1/2 grade so that pt is able to raise arms above her head in a normal velocity without pain.  Baseline:  Goal status: MET    LONG TERM GOALS: Target date: 08/15/2022  PT to be I in an advanced stabilization exercise program to decrease pain to no greater than a 3/10 Baseline:  Goal status: IN PROGRESS  2.  PT UE strength to be  increased 1 grade to allow improved functional use.  Baseline:  Goal status: MET  3.  Hand grip to increase to fast exchange rate of  35# for functional use  Baseline:  Goal status: INITIAL  4.  ROM of cervical spine to be to 70 to allow full scanning  of enviornment.  Goal status: MET     PLAN:  Continue to work on hand and UE strength as well as stability.  PT FREQUENCY: 2x/week  PT DURATION: 6 weeks  PLANNED INTERVENTIONS: Therapeutic exercises, Therapeutic activity, Patient/Family education, Self Care, and Manual therapy  PLAN FOR NEXT SESSION: progress cervical stabilization/UE strength and reduce pain/symptoms.   Rayetta Humphrey, PT CLT [phone]  10:39

## 2022-07-07 ENCOUNTER — Other Ambulatory Visit: Payer: Self-pay | Admitting: Family Medicine

## 2022-07-09 ENCOUNTER — Ambulatory Visit (HOSPITAL_COMMUNITY)
Admission: RE | Admit: 2022-07-09 | Discharge: 2022-07-09 | Disposition: A | Discharge status: Home / Self Care | Payer: BC Managed Care – PPO | Source: Ambulatory Visit | Attending: Family Medicine | Admitting: Family Medicine

## 2022-07-09 ENCOUNTER — Ambulatory Visit (HOSPITAL_COMMUNITY): Payer: BC Managed Care – PPO | Attending: Neurosurgery | Admitting: Physical Therapy

## 2022-07-09 DIAGNOSIS — M5412 Radiculopathy, cervical region: Secondary | ICD-10-CM | POA: Diagnosis not present

## 2022-07-09 DIAGNOSIS — M6281 Muscle weakness (generalized): Secondary | ICD-10-CM | POA: Insufficient documentation

## 2022-07-09 DIAGNOSIS — Z1231 Encounter for screening mammogram for malignant neoplasm of breast: Secondary | ICD-10-CM | POA: Insufficient documentation

## 2022-07-09 NOTE — Therapy (Signed)
OUTPATIENT PHYSICAL THERAPY CERVICAL Treatment  Patient Name: [name] MRN: [id number] DOB:[birth year], 52 y.o., female Today's Date: 07/09/2022  END OF SESSION:   PT End of Session - 07/09/22 1517     Visit Number 10    Number of Visits 12    Date for PT Re-Evaluation 07/02/22    Authorization Type [employer]    Progress Note Due on Visit 10    PT Start Time 0092    PT Stop Time 1545    PT Time Calculation (min) 29 min    Activity Tolerance Patient tolerated treatment well    Behavior During Therapy [facility] for tasks assessed/performed                     Past Medical History:  Diagnosis Date   Back pain    BV (bacterial vaginosis) 09/16/2013   Chest pain 2000   right    Contraceptive management 09/16/2013   Encephalocele ([place]) 1994   Enlarged thyroid    HSV-2 (herpes simplex virus 2) infection    Hypertension    Irregular intermenstrual bleeding 04/27/2015   Obesity    Preterm labor    Sleep disorder    Thyroid nodule    Urinary frequency 12/14/2014   Vaginal discharge 09/16/2013   Past Surgical History:  Procedure Laterality Date   BREAST BIOPSY  2007   btl  [place] N/A 05/06/2016   Procedure: DILATATION & CURETTAGE/HYSTEROSCOPY WITH NOVASURE ABLATION;  Surgeon: Jonnie Kind, MD;  Location: [facility];  Service: Gynecology;  Laterality: N/A;  length-5; width-2.6; power-72 ; time- 1 minute 42 seconds   LAPAROSCOPIC BILATERAL SALPINGECTOMY Bilateral 05/06/2016   Procedure: LAPAROSCOPIC BILATERAL SALPINGECTOMY;  Surgeon: Jonnie Kind, MD;  Location: [facility];  Service: Gynecology;  Laterality: Bilateral;   RETAINED PLACENTA REMOVAL     TUBAL LIGATION     04/2016   tubal ligation reversal  2001   WISDOM TOOTH EXTRACTION     Patient Active Problem List   Diagnosis Date Noted   Acute gastroenteritis 05/05/2022   Generalized pain 05/05/2022   Elevated rheumatoid factor 05/05/2022   Adjustment reaction with  anxiety and depression 04/03/2022   Screen for STD (sexually transmitted disease) 02/05/2022   Acute low back pain without sciatica 11/28/2021   Cervical radiculopathy 11/28/2021   Benign skin mole 03/07/2020   Callus of foot 03/07/2020   Chronic superficial venous thrombosis of lower extremity 06/05/2019   Allergic reaction 12/07/2018   Multiple allergies 11/28/2015   Prediabetes 08/01/2012   Overweight (BMI 25.0-29.9) 08/01/2012   Vitamin D deficiency 07/28/2012   Allergic rhinitis 10/16/2009   Neck pain 03/17/2009   HYPOKALEMIA 03/14/2008   THYROID NODULE 10/26/2007   Essential hypertension 10/26/2007   Back pain with radiation 10/26/2007    PCP: Tula Nakayama  REFERRING PROVIDER: Vallarie Mare, MD  REFERRING DIAG:  Diagnosis Description  PT eval/tx for [phone] cervical spondylosis w/o myelopathy    THERAPY DIAG:  cervicalalgia  Rationale for Evaluation and Treatment: Rehabilitation  ONSET DATE: chronic since 2010 with acute exacerbation   SUBJECTIVE:  SUBJECTIVE STATEMENT: Pt reports she has not had pain in 3 days.   states she's been walking and doing her exercises.     PERTINENT HISTORY:  Chronic back and neck pain, HTN  PAIN:  Are you having pain? No  PRECAUTIONS: None  WEIGHT BEARING RESTRICTIONS: No  FALLS:  Has patient fallen in last 6 months? 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 Findings are most notably as follows.   At C5-C6, there is  progressive mild-to-moderate disc degeneration with minimal degenerative endplate edema. Progressive disc bulge and bilateral uncovertebral hypertrophy. Superimposed small central disc protrusion. The disc bulge and disc protrusion efface the ventral thecal sac, contacting and minimally flattening the ventral aspect of the spinal cord. However, the dorsal CSF space is maintained within the spinal canal. Progressive bilateral neural foraminal narrowing (mild right, mild-to-moderate left).   No significant spinal canal stenosis at the remaining levels. Foraminal stenosis on the left at C6-C7 (moderate/severe) and on the left at C3-C4 (moderate).     Electronically Signed   By: Kellie Simmering D.O.   On: 04/26/2022 12:08  PATIENT SURVEYS:  FOTO 46    PALPATION: Noted tightness and mm spasm in B trapezius    CERVICAL ROM:   Active ROM A/PROM (deg) eval ARom  12/12 AROM 07/02/22  Flexion 30 reps  35 40  Extension 55 reps increase pain  55 60  Right lateral flexion 30 40 45  Left lateral flexion 20 30 35  Right rotation 45 80 80  Left rotation 40 70 75   (Blank rows = not tested) Cervical strength: Extension: 2- Rt side bend: 2- Lt side bend:2- 07/02/22: Rt side bend:  3 Lt side bend:  3 Extension:      3 Minimal exertion noted  UPPER EXTREMITY ROM: Functional with significant encouragement noted facial and verbal expression of pain.  Raising of arms is very slow which, in fact, would make this activity more difficult.  12/27 UE ROM WFL and less painful for pt   UPPER EXTREMITY MMT:  MMT Right eval 12/12/ 07/02/22 Left eval 12/12/ 07/02/22  Shoulder flexion 3 4 3+ _0 Shoulder extension        Shoulder abduction 3 3+ 3+ _1 Shoulder adduction        Shoulder extension        Shoulder internal rotation 3 4+ 3+ 3 4+ 4  Shoulder external rotation 3 4 3+ _2 Middle trapezius        Lower trapezius        Elbow flexion 4   4    Elbow extension 4   4     Wrist flexion        Wrist extension        Wrist ulnar deviation        Wrist radial deviation        Wrist pronation        Wrist supination        Grip strength _3 (Blank rows = not tested)  All manual mm testing had a give way  Fast exchange grip strength is 35#, fast  exchange should be less than grip strength. Fast exchange on 12/12:  35#                        Fast exchange on 12/27:  RT: 30; Lt 30, fast exchange should be less than static.     TODAY'S TREATMENT:                                                                                                                               DATE:  07/09/22 UBE 2 minutes backward level 1  Standing RTB rows 2X15  RTB extensions 2X15   RTB retractions 2X15  Wall push ups Seated:  2# DB flexion x10  2# DB abduction X10  2# DB bicep curls X10 Manual to bil upper trap/scaps to reduce spasm   07/04/22 Hand squeeze black picking up beads 12 B UE x 1  Hand grip white resistance in 4th  x 10 B  Wall push up x 10 Theraband green Scapular retraction x15 Rows x 15 Shoulder extension x 15  Manual to include pressure, efflurage and pettrisage to decrease spasm and pain.   07/02/22:  Sitting: UE : Flexion x 5 Abduction x 5 IR/ER x 5  Cervical isometric x 5 each  Theraputty x 10 given for HEP Standing: Wall push up   06/26/22 Seated:  UBE 1/1 level 1 Standing:  wall push ups 10X  UE flexion against wall 10X  GTB rows 10X2  GTB retractions 10X2  GTB Extensions 10X2  Corner stretch 3X20"           PNF D1/ D2 B            Anti-rotation x 10  Manual: seated to bil Upper trap/scap/scalenes Rt>Lt  06/24/22 Seated:  UBE 2/2 level 1 Standing:  wall push ups 10X  UE flexion against wall 10X  GTB rows 10X  GTB retractions 10X  GTB Extensions 10X Supine:  decompression with RtB 10X each Manual: seated to bil Upper trap/scap/scalenes  06/19/22: Standing: Postural exercise with green theraband: Scapular  retraction x 10 Rows x 10 Shoulder extension x 10  [place] flexion with back to the wall with scapular retraction  Sitting:  W back with 2# x 15 X to V x 15 Money x 15 Trap stretch B 3 x 20"  Cervical isometric with SB and extension x 10 each 5" hold  Shoulder ER with green thera-band x 10 with 5" hold.   Scapular retraction with cervical retraction x 10  PNF I with red t-band B x 10 (sash)  06/17/22 Attempted manual to decrease pain but pt is intolerant. Supine: Scapular retraction x 10 Cervical retraction x 10 Sitting: Postural correction x 10 Cervical and thoracici excursions x 3  Isometric x 5 for SB and extension x5 Retro shoulder circles x 10  05/27/22 Seated:  scapular retractions 10X  3D Thoracic excursions with UE movements 5X each  3D cervical excursions 5X each  05/23/22 Reviewed HEP and goals Seated thoracic extension x10 Seated bil RTB ER x10  evaluation:    Seated Cervical Retraction  -3 - 3-5" hold - Seated Scapular Retraction  3 - 3-5" hold - Standing Cervical Retraction with Sidebending  3 - 10 reps - 3-5" hold - Seated Cervical Retraction and Rotation  3 - 3-5" hold - Seated Transversus Abdominis Bracing  3 x 5"  PATIENT EDUCATION:  Education details: HEP and the importance of good posture Person educated: Patient Education method: Explanation Education comprehension: returned demonstration  HOME EXERCISE PROGRAM:               Access Code: URL: [email or web address] Date: 05/21/2022 Prepared by: Caren Griffins RussellAccess Code: [email or web address] URL: [email or web address] Date: 07/02/2022 Prepared by: Rayetta Humphrey  Exercises - Seated Isometric Cervical Sidebending  - 2 x daily - 7 x weekly - 1 sets - 10 reps - 5-10" hold - Seated Isometric Cervical Extension  - 2 x daily - 7 x weekly - 1 sets - 10 reps - 5-10" hold 06/22/22  06/19/22:  postural theraband exercises with green theraband for rows, scapular retraction and  shoulder extension  12/12:  Cervical and thoracic excursions.   Exercises - Seated Cervical Retraction  - 2 x daily - 7 x weekly - 1 sets - 10 reps - 3-5" hold - Seated Scapular Retraction  - 2 x daily - 7 x weekly - 1 sets - 10 reps - 3-5" hold - Standing Cervical Retraction with Sidebending  - 2 x daily - 7 x weekly - 1 sets - 10 reps - 3-5" hold - Seated Cervical Retraction and Rotation  - 2 x daily - 7 x weekly - 1 sets - 10 reps - 3-5" hold - Seated Transversus Abdominis Bracing  - 4 x daily - 7 x weekly - 1 sets - 10 reps - 3" hold  ASSESSMENT:  CLINICAL IMPRESSION: Continued with postural strengthening with addition of shoulder flexion, abduction and bicep curl strengthening using dumbbells.    Pt still fatigued having to stop after 2 minutes using UBE, however no pain or issues with all other exercises today.  Manual continued with minimal tightness and no spasms palpated.  Overall improving.  Pt had to leave early today to pick up daughter from school.   PT will continue to benefit from skilled therapy to reduce pain and improve overall strength.    OBJECTIVE IMPAIRMENTS: decreased activity tolerance, decreased ROM, decreased strength, increased fascial restrictions, increased muscle spasms, impaired UE functional use, and pain.   ACTIVITY LIMITATIONS: carrying, lifting, sitting, sleeping, and reach over head  PARTICIPATION LIMITATIONS: meal prep, cleaning, laundry, shopping, community activity, and occupation  PERSONAL FACTORS: Behavior pattern and Time since onset of injury/illness/exacerbation are also affecting patient's functional outcome.   REHAB POTENTIAL: Fair    CLINICAL DECISION MAKING: Evolving/moderate complexity  EVALUATION COMPLEXITY: Moderate   GOALS: Goals reviewed with patient? No  SHORT TERM GOALS: Target date: 07/30/2022   PT to be I in HEP to increase cervcial ROM to at least 60 for safer driving.  Baseline:  Goal status: MET  2.  PT pain to be no  greater than a 5/10  Baseline:  Goal status: IN PROGRESS  3.  PT strength of UE to increase 1/2 grade so that pt is able to raise arms above her head in a normal velocity without pain.  Baseline:  Goal status: MET    LONG TERM GOALS: Target date:  08/20/2022  PT to be I in an advanced stabilization exercise program to decrease pain to no greater than a 3/10 Baseline:  Goal status: IN PROGRESS  2.  PT UE strength to be increased 1 grade to allow improved functional use.  Baseline:  Goal status: MET  3.  Hand grip to increase to fast exchange rate of  35# for functional use  Baseline:  Goal status: INITIAL  4.  ROM of cervical spine to be to 70 to allow full scanning of enviornment.  Goal status: MET     PLAN:  Continue to work on hand and UE strength as well as stability.   PT FREQUENCY: 2x/week  PT DURATION: 6 weeks  PLANNED INTERVENTIONS: Therapeutic exercises, Therapeutic activity, Patient/Family education, Self Care, and Manual therapy  PLAN FOR NEXT SESSION: progress cervical stabilization/UE strength and reduce pain/symptoms.   Teena Irani, PTA/CLT [place] Ph: [phone]  Teena Irani, PTA 07/09/2022, 3:22 PM

## 2022-07-10 ENCOUNTER — Telehealth: Payer: Self-pay | Admitting: Family Medicine

## 2022-07-10 NOTE — Telephone Encounter (Signed)
Pt called about pwk that was supposed to have been sent to [place] around Christmas. Wants to know if you have this pwk & status?

## 2022-07-10 NOTE — Telephone Encounter (Signed)
Paperwork received in Dr [employer] box to be completed.

## 2022-07-10 NOTE — Telephone Encounter (Signed)
Ok to complete forms.

## 2022-07-11 ENCOUNTER — Ambulatory Visit (HOSPITAL_COMMUNITY): Payer: BC Managed Care – PPO

## 2022-07-11 ENCOUNTER — Encounter (HOSPITAL_COMMUNITY): Payer: Self-pay

## 2022-07-11 DIAGNOSIS — M5412 Radiculopathy, cervical region: Secondary | ICD-10-CM | POA: Diagnosis not present

## 2022-07-11 DIAGNOSIS — M6281 Muscle weakness (generalized): Secondary | ICD-10-CM | POA: Diagnosis not present

## 2022-07-11 NOTE — Therapy (Signed)
OUTPATIENT PHYSICAL THERAPY CERVICAL Treatment PHYSICAL THERAPY DISCHARGE SUMMARY  Visits from Start of Care: 11  Current functional level related to goals / functional outcomes: See below    Remaining deficits: See below   Education / Equipment: HEP   Patient agrees to discharge. Patient goals were partially met. Patient is being discharged due to  all STG met; 2/4  LTG met with pt being I in HEP.   Patient Name: [name] MRN: [id number] DOB:1970-09-21, 52 y.o., female Today's Date: 07/11/2022   END OF SESSION:   PT End of Session - 07/11/22 1519     Visit Number 11    Number of Visits 12    Date for PT Re-Evaluation 07/11/22    Authorization Type BC[employer]    Progress Note Due on Visit 12    PT Start Time 1350    PT Stop Time 1430    PT Time Calculation (min) 40 min    Activity Tolerance Patient tolerated treatment well    Behavior During Therapy [facility] for tasks assessed/performed               Past Medical History:  Diagnosis Date   Back pain    BV (bacterial vaginosis) 09/16/2013   Chest pain 2000   right    Contraceptive management 09/16/2013   Encephalocele ([place]) 1994   Enlarged thyroid    HSV-2 (herpes simplex virus 2) infection    Hypertension    Irregular intermenstrual bleeding 04/27/2015   Obesity    Preterm labor    Sleep disorder    Thyroid nodule    Urinary frequency 12/14/2014   Vaginal discharge 09/16/2013   Past Surgical History:  Procedure Laterality Date   BREAST BIOPSY  2007   btl  [place] N/A 05/06/2016   Procedure: DILATATION & CURETTAGE/HYSTEROSCOPY WITH NOVASURE ABLATION;  Surgeon: Jonnie Kind, MD;  Location: [facility];  Service: Gynecology;  Laterality: N/A;  length-5; width-2.6; power-72 ; time- 1 minute 42 seconds   LAPAROSCOPIC BILATERAL SALPINGECTOMY Bilateral 05/06/2016   Procedure: LAPAROSCOPIC BILATERAL SALPINGECTOMY;  Surgeon: Jonnie Kind, MD;  Location: [facility];   Service: Gynecology;  Laterality: Bilateral;   RETAINED PLACENTA REMOVAL     TUBAL LIGATION     04/2016   tubal ligation reversal  2001   WISDOM TOOTH EXTRACTION     Patient Active Problem List   Diagnosis Date Noted   Acute gastroenteritis 05/05/2022   Generalized pain 05/05/2022   Elevated rheumatoid factor 05/05/2022   Adjustment reaction with anxiety and depression 04/03/2022   Screen for STD (sexually transmitted disease) 02/05/2022   Acute low back pain without sciatica 11/28/2021   Cervical radiculopathy 11/28/2021   Benign skin mole 03/07/2020   Callus of foot 03/07/2020   Chronic superficial venous thrombosis of lower extremity 06/05/2019   Allergic reaction 12/07/2018   Multiple allergies 11/28/2015   Prediabetes 08/01/2012   Overweight (BMI 25.0-29.9) 08/01/2012   Vitamin D deficiency 07/28/2012   Allergic rhinitis 10/16/2009   Neck pain 03/17/2009   HYPOKALEMIA 03/14/2008   THYROID NODULE 10/26/2007   Essential hypertension 10/26/2007   Back pain with radiation 10/26/2007    PCP: Tula Nakayama  REFERRING PROVIDER: Vallarie Mare, MD  REFERRING DIAG:  Diagnosis Description  PT eval/tx for [phone] cervical spondylosis w/o myelopathy    THERAPY DIAG:  cervicalalgia  Rationale for Evaluation and Treatment: Rehabilitation  ONSET DATE: chronic since 2010 with acute exacerbation   SUBJECTIVE:  SUBJECTIVE STATEMENT:  Pt stated she has increased her compliance with HEP and feels exercises are getting easier.  Feels she has improved 80% with therapy except for Rt shoulder feels weak today.  Reports she went grocery shopping prior session today, Rt arm feels weak.  Returns to MD simpson on 07/16/22.  PERTINENT HISTORY:  Chronic back and neck pain, HTN  PAIN:   Are you having pain? Yes Lt side of neck 4/10 achey, comes and goes.  Rt shoulder feels weak.     PRECAUTIONS: None  WEIGHT BEARING RESTRICTIONS: No  FALLS:  Has patient fallen in last 6 months? 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 Findings are most notably as follows.   At C5-C6, there is progressive mild-to-moderate disc degeneration with minimal degenerative endplate edema. Progressive disc bulge and bilateral uncovertebral hypertrophy. Superimposed small central disc protrusion. The disc bulge and disc protrusion efface the ventral thecal sac, contacting and minimally flattening the ventral aspect of the spinal cord. However, the dorsal CSF space is maintained within the spinal canal. Progressive bilateral neural foraminal narrowing (mild right, mild-to-moderate left).   No significant spinal canal stenosis at the remaining levels. Foraminal stenosis on the left at C6-C7 (moderate/severe) and on the left at C3-C4 (moderate).     Electronically Signed   By: Kellie Simmering D.O.   On: 04/26/2022 12:08  PATIENT SURVEYS:  FOTO 46    PALPATION: Noted tightness and mm spasm in B trapezius    CERVICAL ROM:   Active ROM A/PROM (deg) eval ARom  12/12 AROM 07/02/22  Flexion 30 reps  35 40  Extension 55 reps increase pain  55 60  Right lateral flexion 30 40 45  Left lateral flexion 20 30 35  Right rotation 45 80 80  Left rotation 40 70 75   (Blank rows = not tested) Cervical strength: Extension: 2- Rt side bend: 2- Lt side bend:2- 07/02/22: Rt side bend:  3 Lt side bend:  3 Extension:      3 Minimal exertion noted  UPPER EXTREMITY ROM: Functional with  significant encouragement noted facial and verbal expression of pain.  Raising of arms is very slow which, in fact, would make this activity more difficult.  12/27 UE ROM WFL and less painful for pt   UPPER EXTREMITY MMT:  MMT Right eval 12/12/ 07/02/22 07/11/22 Left eval 12/12/ 07/02/22 07/11/22  Shoulder flexion 3 4 3+ 3+ '3 4 4 '$ 4+  Shoulder extension          Shoulder abduction 3 3+ 3+ 3+ '3 4 4 4  '$ Shoulder adduction          Shoulder extension          Shoulder internal rotation 3 4+ 3+ 3+ 3 4+ 4 4  Shoulder external rotation 3 4 3+ 3+ '3 4 4 '$ 4+  Middle trapezius          Lower trapezius          Elbow flexion 4    4     Elbow extension 4    4     Wrist flexion          Wrist extension  Wrist ulnar deviation          Wrist radial deviation          Wrist pronation          Wrist supination          Grip strength '13 25 12 25 10 17 18 '$ 32   (Blank rows = not tested)  All manual mm testing had a give way  Fast exchange grip strength is 35#, fast exchange should be less than grip strength. Fast exchange on 12/12:  35#                        Fast exchange on 12/27:  RT: 30; Lt 30, fast exchange should be less than static.     TODAY'S TREATMENT:                                                                                                                               DATE:  07/11/22 Seated:  UBE 1/1 level 1 Seated:  2# DB flexion x10  2# DB abduction X10  2# DB bicep curls X10 Manual to bil upper trap/supraspinatus/levator scapula to reduce spasm and pain Grip strength testing MMT Reviewed goals  07/09/22 UBE 2 minutes backward level 1  Standing RTB rows 2X15  RTB extensions 2X15   RTB retractions 2X15  Wall push ups Seated:  2# DB flexion x10  2# DB abduction X10  2# DB bicep curls X10 Manual to bil upper trap/scaps to reduce spasm   07/04/22 Hand squeeze black picking up beads 12 B UE x 1  Hand grip white resistance in 4th  x 10 B  Wall push up x 10 Theraband  green Scapular retraction x15 Rows x 15 Shoulder extension x 15  Manual to include pressure, efflurage and pettrisage to decrease spasm and pain.   07/02/22:  Sitting: UE : Flexion x 5 Abduction x 5 IR/ER x 5  Cervical isometric x 5 each  Theraputty x 10 given for HEP Standing: Wall push up   06/26/22 Seated:  UBE 1/1 level 1 Standing:  wall push ups 10X  UE flexion against wall 10X  GTB rows 10X2  GTB retractions 10X2  GTB Extensions 10X2  Corner stretch 3X20"           PNF D1/ D2 B            Anti-rotation x 10  Manual: seated to bil Upper trap/scap/scalenes Rt>Lt  06/24/22 Seated:  UBE 2/2 level 1 Standing:  wall push ups 10X  UE flexion against wall 10X  GTB rows 10X  GTB retractions 10X  GTB Extensions 10X Supine:  decompression with RtB 10X each Manual: seated to bil Upper trap/scap/scalenes  06/19/22: Standing: Postural exercise with green theraband: Scapular retraction x 10 Rows x 10 Shoulder extension x 10  [place] flexion with back to the wall with scapular retraction  Sitting:  W  back with 2# x 15 X to V x 15 Money x 15 Trap stretch B 3 x 20"  Cervical isometric with SB and extension x 10 each 5" hold  Shoulder ER with green thera-band x 10 with 5" hold.   Scapular retraction with cervical retraction x 10  PNF I with red t-band B x 10 (sash)  06/17/22 Attempted manual to decrease pain but pt is intolerant. Supine: Scapular retraction x 10 Cervical retraction x 10 Sitting: Postural correction x 10 Cervical and thoracici excursions x 3  Isometric x 5 for SB and extension x5 Retro shoulder circles x 10  05/27/22 Seated:  scapular retractions 10X  3D Thoracic excursions with UE movements 5X each  3D cervical excursions 5X each  05/23/22 Reviewed HEP and goals Seated thoracic extension x10 Seated bil RTB ER x10  evaluation:    Seated Cervical Retraction  -3 - 3-5" hold - Seated Scapular Retraction  3 - 3-5" hold - Standing Cervical  Retraction with Sidebending  3 - 10 reps - 3-5" hold - Seated Cervical Retraction and Rotation  3 - 3-5" hold - Seated Transversus Abdominis Bracing  3 x 5"  PATIENT EDUCATION:  Education details: HEP and the importance of good posture Person educated: Patient Education method: Explanation Education comprehension: returned demonstration  HOME EXERCISE PROGRAM:               Access Code: URL: [email or web address] Date: 05/21/2022 Prepared by: Caren Griffins RussellAccess Code: [email or web address] URL: [email or web address] Date: 07/02/2022 Prepared by: Rayetta Humphrey  Exercises - Seated Isometric Cervical Sidebending  - 2 x daily - 7 x weekly - 1 sets - 10 reps - 5-10" hold - Seated Isometric Cervical Extension  - 2 x daily - 7 x weekly - 1 sets - 10 reps - 5-10" hold 06/22/22  06/19/22:  postural theraband exercises with green theraband for rows, scapular retraction and shoulder extension  12/12:  Cervical and thoracic excursions.   Exercises - Seated Cervical Retraction  - 2 x daily - 7 x weekly - 1 sets - 10 reps - 3-5" hold - Seated Scapular Retraction  - 2 x daily - 7 x weekly - 1 sets - 10 reps - 3-5" hold - Standing Cervical Retraction with Sidebending  - 2 x daily - 7 x weekly - 1 sets - 10 reps - 3-5" hold - Seated Cervical Retraction and Rotation  - 2 x daily - 7 x weekly - 1 sets - 10 reps - 3-5" hold - Seated Transversus Abdominis Bracing  - 4 x daily - 7 x weekly - 1 sets - 10 reps - 3" hold  ASSESSMENT:  CLINICAL IMPRESSION: Reviewed goals today with noted increased weakness and pain Rt shoulder and grip strength compared to reassessment on 06/17/22.  Reviewed importance of HEP compliance for maximal benefits and pt reports she completes exercises daily.  Pt returns to MD on 07/16/22, pt educated on purpose of therapy, showing progress and if noted reduction in strength may need to return to MD with verbalized understanding.  EOS with manual STM to address  spasm in supraspinatus and reduce overall tightness in UT/levator scapula region with reports of pain reduced.  Pt given printout of local massage therapists as may benefit for pain control.    OBJECTIVE IMPAIRMENTS: decreased activity tolerance, decreased ROM, decreased strength, increased fascial restrictions, increased muscle spasms, impaired UE functional use, and pain.   ACTIVITY LIMITATIONS: carrying, lifting, sitting, sleeping, and reach over head  PARTICIPATION LIMITATIONS: meal prep,  cleaning, laundry, shopping, community activity, and occupation  PERSONAL FACTORS: Behavior pattern and Time since onset of injury/illness/exacerbation are also affecting patient's functional outcome.   REHAB POTENTIAL: Fair    CLINICAL DECISION MAKING: Evolving/moderate complexity  EVALUATION COMPLEXITY: Moderate   GOALS: Goals reviewed with patient? No  SHORT TERM GOALS: Target date: 08/01/2022   PT to be I in HEP to increase cervcial ROM to at least 60 for safer driving.  Baseline:  Goal status: MET  2.  PT pain to be no greater than a 5/10  Baseline: 07/11/22:  Pain comes and goals, average pain scale 4/10.   Goal status: MET  3.  PT strength of UE to increase 1/2 grade so that pt is able to raise arms above her head in a normal velocity without pain.  Baseline: 07/11/22:  increased Rt UE weakness noted compared to reassessment on 12/12. Goal status: MET    LONG TERM GOALS: Target date: 08/22/2022  PT to be I in an advanced stabilization exercise program to decrease pain to no greater than a 3/10 Baseline:  07/11/21:  Reports compliance with advanced HEP, neck pain comes and goes with pain scale 4/10 today.  Feels grocery shopping and weather play a part. Goal status: IN PROGRESS  2.  PT UE strength to be increased 1 grade to allow improved functional use.  Baseline:  Goal status: MET  3.  Hand grip to increase to fast exchange rate of  35# for functional use  Baseline: 07/11/22: Rt 25#,  LT 32# Goal status: IN PROGRESS  4.  ROM of cervical spine to be to 70 to allow full scanning of enviornment.  Goal status: MET   PT FREQUENCY: 2x/week  PT DURATION: 6 weeks  PLANNED INTERVENTIONS: Therapeutic exercises, Therapeutic activity, Patient/Family education, Self Care, and Manual therapy  PLAN FOR NEXT SESSION: DC to HEP.  Ihor Austin, LPTA/CLT; [place] Rayetta Humphrey, [place] CLT [phone]  07/11/2022, 5:48 PM

## 2022-07-14 ENCOUNTER — Encounter (HOSPITAL_COMMUNITY): Payer: BC Managed Care – PPO | Admitting: Physical Therapy

## 2022-07-16 ENCOUNTER — Ambulatory Visit (INDEPENDENT_AMBULATORY_CARE_PROVIDER_SITE_OTHER): Payer: BC Managed Care – PPO | Admitting: Family Medicine

## 2022-07-16 ENCOUNTER — Encounter: Payer: Self-pay | Admitting: Family Medicine

## 2022-07-16 ENCOUNTER — Encounter (HOSPITAL_COMMUNITY): Payer: BC Managed Care – PPO | Admitting: Physical Therapy

## 2022-07-16 ENCOUNTER — Telehealth: Payer: Self-pay | Admitting: Family Medicine

## 2022-07-16 ENCOUNTER — Ambulatory Visit: Payer: BC Managed Care – PPO | Admitting: Physician Assistant

## 2022-07-16 VITALS — BP 123/74 | HR 68 | Ht 66.0 in | Wt 175.1 lb

## 2022-07-16 DIAGNOSIS — M25511 Pain in right shoulder: Secondary | ICD-10-CM | POA: Diagnosis not present

## 2022-07-16 DIAGNOSIS — R768 Other specified abnormal immunological findings in serum: Secondary | ICD-10-CM

## 2022-07-16 DIAGNOSIS — I10 Essential (primary) hypertension: Secondary | ICD-10-CM | POA: Diagnosis not present

## 2022-07-16 DIAGNOSIS — E876 Hypokalemia: Secondary | ICD-10-CM

## 2022-07-16 DIAGNOSIS — G8929 Other chronic pain: Secondary | ICD-10-CM

## 2022-07-16 NOTE — Progress Notes (Signed)
   [name]     MRN: [id number]      DOB: Sep 23, 1970   HPI [name] is here for follow up and re-evaluation of chronic medical conditions,  Has competed 6 weeks of OT  for neck and bilateral shoulder pain states that on good days she is 40 % better on neck and left shoulder , however the right shoulder is NO better , feels weak from  shoulder to elbow, constant pain  rated  at 6 , states Therapy recommended that she  return to Ortho for re evaluation Unable to start rx for RA pending eye exam scheduled for 07/25/2022, or [facility], pt unsure  ROS Denies recent fever or chills. Denies sinus pressure, nasal congestion, ear pain or sore throat. Denies chest congestion, productive cough or wheezing. Denies chest pains, palpitations and leg swelling Denies abdominal pain, nausea, vomiting,diarrhea or constipation.   Denies dysuria, frequency, hesitancy or incontinence.  Denies headaches, seizures. Denies depression, anxiety or insomnia. Denies skin break down or rash.   PE  BP 123/74 (BP Location: Left Arm, Patient Position: Sitting, Cuff Size: Large)   Pulse 68   Ht '5\' 6"'$  (1.676 m)   Wt 175 lb 1.9 oz (79.4 kg)   SpO2 95%   BMI 28.27 kg/m   Patient alert and oriented and in no cardiopulmonary distress.  HEENT: No facial asymmetry, EOMI,     Neck decreased ROM .  Chest: Clear to auscultation bilaterally.  CVS: S1, S2 no murmurs, no S3.Regular rate.  ABD: Soft non tender.   Ext: No edema  MS: Adequate though reduced  ROM spine , reduced  ROM right  shoulder, normal in hips and knees.  Skin: Intact, no ulcerations or rash noted.  Psych: Good eye contact, normal affect. Memory intact not anxious or depressed appearing.  CNS: CN 2-12 intact, grade 4 power I right hand,  normal throughout.no focal deficits noted.   Assessment & Plan  Essential hypertension Controlled, no change in medication DASH diet and commitment to daily physical activity for a minimum of 30 minutes  discussed and encouraged, as a part of hypertension management. The importance of attaining a healthy weight is also discussed.     07/16/2022    8:16 AM 06/18/2022    8:46 AM 06/12/2022   10:09 AM 06/12/2022   10:04 AM 04/21/2022    9:01 AM 04/11/2022    8:33 AM 04/02/2022    1:21 PM  BP/Weight  Systolic BP 825 003 704 888 [phone]  Diastolic BP 74 82 86 80 72 87 80  Wt. (Lbs) 175.12 173.6  173 175.08 175 175  BMI 28.27 kg/m2 28.02 kg/m2  27.92 kg/m2 28.26 kg/m2 27.41 kg/m2 23.73 kg/m2       Elevated rheumatoid factor Dx with rheumatoid arthritis, waiting to start treatment after ophthalmology exam  Chronic right shoulder pain No improvement with PT , ortho to re eval  HYPOKALEMIA Chronic problem , has ben referred to Nephrology Updated lab needed at/ before next visit.

## 2022-07-16 NOTE — Patient Instructions (Addendum)
F/u in 10 weeks, call if you need me sooner  Nurse pls order chem 7 and eGFR to be done at the hospital when she has lab draw for Oncology, dx is low potassium and HTN  Need to send off cologuard , please do this  You are referred to Orthopedics in [place] they will call  I hope that you start feeling better once treatment is started  Thanks for choosing [facility], we consider it a privelige to serve you.

## 2022-07-16 NOTE — Progress Notes (Signed)
Office Visit Note  Patient: [name]             Date of Birth: Nov 25, 1970           MRN: [id number]             PCP: Kerri Perches, MD Referring: Kerri Perches, MD Visit Date: 07/30/2022 Occupation: @GUAROCC @  Subjective:  Medication monitoring   History of Present Illness: [name] is a 52 y.o. female with history of seropositive rheumatoid arthritis and osteoarthritis.  She had a baseline Plaquenil eye examination on 07/23/2022.  Patient reports that her eye doctor was okay with her proceeding with Plaquenil but recommended repeating testing due to poor reliability during exam.  She took her first dose of Plaquenil yesterday.  She states that she had a half a tablet around 3 PM and tolerated it without any side effects.  She plans on continuing Plaquenil as prescribed.  Patient reports that she continues to have discomfort and neuralgias in the right arm.  She is unsure how much of her discomfort is due to her neck pain versus shoulder joint.  She tried physical therapy but has discontinued due to no improvement in her symptoms.   She continues to have pain and stiffness in the right hand.  She does not think that she will be able to go back to work on 08/10/2022 due to upcoming appointment scheduled as well as difficulty using her right arm.   Activities of Daily Living:  Patient reports morning stiffness for several hours.   Patient Reports nocturnal pain.  Difficulty dressing/grooming: Reports Difficulty climbing stairs: Reports Difficulty getting out of chair: Reports Difficulty using hands for taps, buttons, cutlery, and/or writing: Reports  Review of Systems  Constitutional:  Negative for fatigue.  HENT:  Negative for mouth sores and mouth dryness.   Eyes:  Negative for dryness.  Respiratory:  Negative for shortness of breath.   Cardiovascular:  Negative for chest pain and palpitations.  Gastrointestinal:  Negative for blood in stool, constipation and diarrhea.   Endocrine: Negative for increased urination.  Genitourinary:  Negative for involuntary urination.  Musculoskeletal:  Positive for joint pain, joint pain, joint swelling, myalgias, morning stiffness, muscle tenderness and myalgias. Negative for gait problem and muscle weakness.  Skin:  Negative for color change, rash, hair loss and sensitivity to sunlight.  Allergic/Immunologic: Negative for susceptible to infections.  Neurological:  Negative for dizziness and headaches.  Hematological:  Negative for swollen glands.  Psychiatric/Behavioral:  Negative for depressed mood and sleep disturbance. The patient is not nervous/anxious.     PMFS History:  Patient Active Problem List   Diagnosis Date Noted   RBC microcytosis 07/25/2022   Leukocytosis 07/25/2022   Chronic right shoulder pain 07/21/2022   Generalized pain 05/05/2022   Elevated rheumatoid factor 05/05/2022   Adjustment reaction with anxiety and depression 04/03/2022   Screen for STD (sexually transmitted disease) 02/05/2022   Acute low back pain without sciatica 11/28/2021   Cervical radiculopathy 11/28/2021   Benign skin mole 03/07/2020   Callus of foot 03/07/2020   Chronic superficial venous thrombosis of lower extremity 06/05/2019   Allergic reaction 12/07/2018   Multiple allergies 11/28/2015   Prediabetes 08/01/2012   Overweight (BMI 25.0-29.9) 08/01/2012   Vitamin D deficiency 07/28/2012   Allergic rhinitis 10/16/2009   Neck pain 03/17/2009   HYPOKALEMIA 03/14/2008   THYROID NODULE 10/26/2007   Essential hypertension 10/26/2007   Back pain with radiation 10/26/2007    Past Medical  History:  Diagnosis Date   Back pain    BV (bacterial vaginosis) 09/16/2013   Chest pain 2000   right    Contraceptive management 09/16/2013   Encephalocele (HCC) 1994   Enlarged thyroid    HSV-2 (herpes simplex virus 2) infection    Hypertension    Irregular intermenstrual bleeding 04/27/2015   Obesity    Preterm labor    Sleep  disorder    Thyroid nodule    Urinary frequency 12/14/2014   Vaginal discharge 09/16/2013    Family History  Problem Relation Age of Onset   Anuerysm Mother    Stroke Mother    Cancer Father        thyroid    Hypertension Father    Cancer Paternal Grandmother        breast   Seizures Maternal Grandmother    Past Surgical History:  Procedure Laterality Date   BREAST BIOPSY  2007   btl  1994   DILITATION & CURRETTAGE/HYSTROSCOPY WITH NOVASURE ABLATION N/A 05/06/2016   Procedure: DILATATION & CURETTAGE/HYSTEROSCOPY WITH NOVASURE ABLATION;  Surgeon: Tilda Burrow, MD;  Location: [facility];  Service: Gynecology;  Laterality: N/A;  length-5; width-2.6; power-72 ; time- 1 minute 42 seconds   LAPAROSCOPIC BILATERAL SALPINGECTOMY Bilateral 05/06/2016   Procedure: LAPAROSCOPIC BILATERAL SALPINGECTOMY;  Surgeon: Tilda Burrow, MD;  Location: [facility];  Service: Gynecology;  Laterality: Bilateral;   RETAINED PLACENTA REMOVAL     TUBAL LIGATION     04/2016   tubal ligation reversal  2001   WISDOM TOOTH EXTRACTION     Social History   Social History Narrative   Not on file   Immunization History  Administered Date(s) Administered   Influenza,inj,Quad PF,6+ Mos 08/31/2014   PFIZER(Purple Top)SARS-COV-2 Vaccination 02/18/2020   Td 12/18/2010   Tdap 07/07/2002     Objective: Vital Signs: BP 130/84 (BP Location: Left Arm, Patient Position: Sitting, Cuff Size: Normal)   Pulse (!) 56   Resp 15   Ht 5\' 6"  (1.676 m)   Wt 172 lb 6.4 oz (78.2 kg)   BMI 27.83 kg/m    Physical Exam Vitals and nursing note reviewed.  Constitutional:      Appearance: She is well-developed.  HENT:     Head: Normocephalic and atraumatic.  Eyes:     Conjunctiva/sclera: Conjunctivae normal.  Cardiovascular:     Rate and Rhythm: Normal rate and regular rhythm.     Heart sounds: Normal heart sounds.  Pulmonary:     Effort: Pulmonary effort is normal.     Breath sounds: Normal breath sounds.  Abdominal:      General: Bowel sounds are normal.     Palpations: Abdomen is soft.  Musculoskeletal:     Cervical back: Normal range of motion.  Skin:    General: Skin is warm and dry.     Capillary Refill: Capillary refill takes less than 2 seconds.  Neurological:     Mental Status: She is alert and oriented to person, place, and time.  Psychiatric:        Behavior: Behavior normal.      Musculoskeletal Exam: Generalized hyperalgesia and positive tender points on examination.  C-spine has limited range of motion with lateral rotation.  She has trapezius muscle tension and tenderness bilaterally, right greater than left.  Painful limited range of motion of the right shoulder joint.  Elbow joints have good range of motion with no warmth or swelling.  Painful range of motion of the right  wrist joint with tenderness upon palpation.  Tenderness over first through fifth MCP joints of the right hand.  Complete fist formation noted.  Hip joints have good range of motion with no groin pain.  Knee joints have good range of motion with no warmth or effusion.  Ankle joints have good range of motion with no tenderness or joint swelling. CDAI Exam: CDAI Score: -- Patient Global: --; Provider Global: -- Swollen: --; Tender: -- Joint Exam 07/30/2022   No joint exam has been documented for this visit   There is currently no information documented on the homunculus. Go to the Rheumatology activity and complete the homunculus joint exam.  Investigation: No additional findings.  Imaging: MM 3D SCREEN BREAST BILATERAL  Result Date: 07/10/2022 CLINICAL DATA:  Screening. EXAM: DIGITAL SCREENING BILATERAL MAMMOGRAM WITH TOMOSYNTHESIS AND CAD TECHNIQUE: Bilateral screening digital craniocaudal and mediolateral oblique mammograms were obtained. Bilateral screening digital breast tomosynthesis was performed. The images were evaluated with computer-aided detection. COMPARISON:  Previous exam(s). ACR Breast Density Category c:  The breast tissue is heterogeneously dense, which may obscure small masses. FINDINGS: There are no findings suspicious for malignancy. IMPRESSION: No mammographic evidence of malignancy. A result letter of this screening mammogram will be mailed directly to the patient. RECOMMENDATION: Screening mammogram in one year. (Code:SM-B-01Y) BI-RADS CATEGORY  1: Negative. Electronically Signed   By: Gerome Sam III M.D.   On: 07/10/2022 13:05    Recent Labs: Lab Results  Component Value Date   WBC 9.3 07/25/2022   HGB 12.6 07/25/2022   PLT 338 07/25/2022   NA 139 06/18/2022   K 3.1 (L) 06/18/2022   CL 99 06/18/2022   CO2 28 06/18/2022   GLUCOSE 73 06/18/2022   BUN 12 06/18/2022   CREATININE 0.93 06/18/2022   BILITOT 0.6 06/18/2022   ALKPHOS 112 09/10/2021   AST 14 06/18/2022   ALT 15 06/18/2022   PROT 7.9 06/18/2022   ALBUMIN 4.3 09/10/2021   CALCIUM 9.7 06/18/2022   GFRAA 86 12/27/2019    Speciality Comments: No specialty comments available.  Procedures:  No procedures performed Allergies: Biaxin [clarithromycin], Iodinated contrast media, Depo-medrol [methylprednisolone], Shellfish allergy, Yellow jacket venom [bee venom], and Peanut-containing drug products   Assessment / Plan:     Visit Diagnoses: Rheumatoid arthritis with rheumatoid factor of multiple sites without organ or systems involvement (HCC) - 06/12/22: RF 165, anti-CCP-, ESR WNL, CRP WNL: Ultrasound of both hands was negative for synovitis: Patient was last seen in the office on 06/18/2022 at which time the plan was for her to initiate Plaquenil.  She held off on initiating Plaquenil until she had completed the baseline Plaquenil eye examination.  She had a Plaquenil eye examination performed on 07/23/22: No signs of Plaquenil toxicity but had poor reliability during the testing so they plan repeating the test in 6 months.  Patient reports that she took half tablet of Plaquenil yesterday and tolerated it without any side  effects.  She plans on gradually increasing the dose of Plaquenil to twice daily Monday through Friday as prescribed to ensure tolerability.  She continues to experience pain and stiffness especially in her right hand and right wrist joint.  She has ongoing pain in her right arm and had to discontinue physical therapy due to no improvement in her symptoms.  She is currently awaiting an appointment with orthopedics for further evaluation. She does not feel ready to return to work due to being unable to use her right arm as well as  due to the frequency of office visits.  In the future I recommended having orthopedics extend her work leave if needed since they will be managing her neck and right shoulder pain.  We can extend her work leave until she has establish care with orthopedics. She will follow-up in the office in 6 weeks to assess her response to Plaquenil.  High risk medication use - Plaquenil 200 mg 1 tablet by mouth twice daily Monday through Friday.  Plaquenil eye examination performed on 07/23/2022 did not reveal any Plaquenil toxicity.  Plan to repeat test in 6 months due to poor reliability.  Advised patient to return for lab work in 1 month, 3 months, then every 5 months while taking Plaquenil.  Pain in both hands - RF 175.8, ESR WNL on 05/02/22, generalized myalgias, no family hx of RA, cannot wear rings.  Patient continues to have difficulty using her right hand at times.  On examination she had no obvious synovitis but continues to have tenderness over all MCP joints of the right hand.  She was able to make a complete fist bilaterally.  Primary osteoarthritis of both hands: XR on 06/12/22 were consistent with early OA. She continues to have pain and stiffness in the right hand on a daily basis.  No synovitis noted.   Questionable cervical radiculopathy could contributing to her pain.  She has had a NCV with EMG in the past, which was inconclusive due to being unable to complete the testing  due to the severity of pain she was experiencing. Patient tried physical therapy with no improvement in her symptoms.  She is currently awaiting an appointment with orthopedics for further evaluation.  Her symptoms seem to be unrelated to rheumatoid arthritis.  Primary osteoarthritis of both feet - Clinical and radiographic findings are consistent with osteoarthritis of both feet. She is not experiencing any increased pain or stiffness in her feet currently.    Chondromalacia patellae, left knee: Good ROM with no warmth or effusion.    Chondromalacia patellae, right knee: Good ROM with no warmth or effusion.   Myofascial pain: She has generalized hyperalgesia and positive tender points on examination today consistent with myofascial pain.  She is been experiencing more frequent muscle spasms.  A prescription for methocarbamol 500 mg 1 tablet daily as needed for muscle spasms was sent to the pharmacy.  She tried physical therapy but has had no improvement in her symptoms.   Cervical radiculopathy: C-spine has limited range of motion with lateral Tatian.  She has been experiencing trapezius muscle tension tenderness bilaterally.  She is experiencing muscle spasms intermittently.  A prescription for methocarbamol 500 mg 1 tablet by mouth daily as needed for muscle spasms was sent to the pharmacy.  She is currently awaiting appointment with orthopedics for further evaluation.  She tried physical therapy with no improvement in her symptoms.  Right arm pain: She has been experiencing increased neck pain and stiffness as well as pain in the right upper arm.  She is currently awaiting an appointment with orthopedics for further evaluation . She had no improvement with PT.   Other medical conditions are listed as follows:   Vitamin D deficiency  Prediabetes  Essential hypertension: BP was 130/84 today in the office.   Adjustment reaction with anxiety and depression  Orders: No orders of the defined  types were placed in this encounter.  Meds ordered this encounter  Medications   methocarbamol (ROBAXIN) 500 MG tablet    Sig: Take 1 tablet (  500 mg total) by mouth daily as needed for muscle spasms.    Dispense:  30 tablet    Refill:  0      Follow-Up Instructions: Return in about 6 weeks (around 09/10/2022).   Gearldine Bienenstock, PA-C  Note - This record has been created using Dragon software.  Chart creation errors have been sought, but may not always  have been located. Such creation errors do not reflect on  the standard of medical care.

## 2022-07-16 NOTE — Telephone Encounter (Signed)
Referral was sent to Dr. Aline Brochure. She is wanting it send to [facility] can you please resend?

## 2022-07-17 ENCOUNTER — Ambulatory Visit: Payer: BC Managed Care – PPO | Admitting: Family Medicine

## 2022-07-17 NOTE — Telephone Encounter (Signed)
Has she noticed any improvement since starting on plaquenil?  I will be unable to determine when she will be able to return to work unless we have notes from PT to review.

## 2022-07-17 NOTE — Telephone Encounter (Signed)
Ok to complete necessary forms.

## 2022-07-21 ENCOUNTER — Encounter: Payer: Self-pay | Admitting: Family Medicine

## 2022-07-21 DIAGNOSIS — G8929 Other chronic pain: Secondary | ICD-10-CM | POA: Insufficient documentation

## 2022-07-21 NOTE — Assessment & Plan Note (Signed)
Dx with rheumatoid arthritis, waiting to start treatment after ophthalmology exam

## 2022-07-21 NOTE — Assessment & Plan Note (Signed)
No improvement with PT , ortho to re eval

## 2022-07-21 NOTE — Assessment & Plan Note (Signed)
Controlled, no change in medication DASH diet and commitment to daily physical activity for a minimum of 30 minutes discussed and encouraged, as a part of hypertension management. The importance of attaining a healthy weight is also discussed.     07/16/2022    8:16 AM 06/18/2022    8:46 AM 06/12/2022   10:09 AM 06/12/2022   10:04 AM 04/21/2022    9:01 AM 04/11/2022    8:33 AM 04/02/2022    1:21 PM  BP/Weight  Systolic BP 449 675 916 384 665 993 570  Diastolic BP 74 82 86 80 72 87 80  Wt. (Lbs) 175.12 173.6  173 175.08 175 175  BMI 28.27 kg/m2 28.02 kg/m2  27.92 kg/m2 28.26 kg/m2 27.41 kg/m2 23.73 kg/m2

## 2022-07-21 NOTE — Assessment & Plan Note (Signed)
Chronic problem , has ben referred to Nephrology Updated lab needed at/ before next visit.

## 2022-07-23 DIAGNOSIS — Z79899 Other long term (current) drug therapy: Secondary | ICD-10-CM | POA: Diagnosis not present

## 2022-07-25 ENCOUNTER — Inpatient Hospital Stay: Payer: BC Managed Care – PPO

## 2022-07-25 ENCOUNTER — Inpatient Hospital Stay: Payer: BC Managed Care – PPO | Attending: Hematology | Admitting: Hematology

## 2022-07-25 VITALS — BP 126/90 | HR 58 | Temp 98.7°F | Resp 17 | Ht 66.0 in | Wt 171.8 lb

## 2022-07-25 DIAGNOSIS — D72829 Elevated white blood cell count, unspecified: Secondary | ICD-10-CM | POA: Insufficient documentation

## 2022-07-25 DIAGNOSIS — D72825 Bandemia: Secondary | ICD-10-CM

## 2022-07-25 DIAGNOSIS — D72828 Other elevated white blood cell count: Secondary | ICD-10-CM | POA: Diagnosis not present

## 2022-07-25 DIAGNOSIS — Z803 Family history of malignant neoplasm of breast: Secondary | ICD-10-CM | POA: Diagnosis not present

## 2022-07-25 DIAGNOSIS — M069 Rheumatoid arthritis, unspecified: Secondary | ICD-10-CM | POA: Insufficient documentation

## 2022-07-25 DIAGNOSIS — R718 Other abnormality of red blood cells: Secondary | ICD-10-CM | POA: Insufficient documentation

## 2022-07-25 DIAGNOSIS — Z808 Family history of malignant neoplasm of other organs or systems: Secondary | ICD-10-CM | POA: Diagnosis not present

## 2022-07-25 LAB — RETICULOCYTES
Immature Retic Fract: 10.6 % (ref 2.3–15.9)
RBC.: 5.59 MIL/uL — ABNORMAL HIGH (ref 3.87–5.11)
Retic Count, Absolute: 58.1 10*3/uL (ref 19.0–186.0)
Retic Ct Pct: 1 % (ref 0.4–3.1)

## 2022-07-25 LAB — CBC WITH DIFFERENTIAL/PLATELET
Abs Immature Granulocytes: 0.03 10*3/uL (ref 0.00–0.07)
Basophils Absolute: 0.1 10*3/uL (ref 0.0–0.1)
Basophils Relative: 1 %
Eosinophils Absolute: 0.2 10*3/uL (ref 0.0–0.5)
Eosinophils Relative: 2 %
HCT: 40.7 % (ref 36.0–46.0)
Hemoglobin: 12.6 g/dL (ref 12.0–15.0)
Immature Granulocytes: 0 %
Lymphocytes Relative: 17 %
Lymphs Abs: 1.6 10*3/uL (ref 0.7–4.0)
MCH: 22.1 pg — ABNORMAL LOW (ref 26.0–34.0)
MCHC: 31 g/dL (ref 30.0–36.0)
MCV: 71.4 fL — ABNORMAL LOW (ref 80.0–100.0)
Monocytes Absolute: 0.6 10*3/uL (ref 0.1–1.0)
Monocytes Relative: 7 %
Neutro Abs: 6.8 10*3/uL (ref 1.7–7.7)
Neutrophils Relative %: 73 %
Platelets: 338 10*3/uL (ref 150–400)
RBC: 5.7 MIL/uL — ABNORMAL HIGH (ref 3.87–5.11)
RDW: 15.1 % (ref 11.5–15.5)
WBC: 9.3 10*3/uL (ref 4.0–10.5)
nRBC: 0 % (ref 0.0–0.2)

## 2022-07-25 LAB — LACTATE DEHYDROGENASE: LDH: 111 U/L (ref 98–192)

## 2022-07-25 NOTE — Patient Instructions (Addendum)
[facility]  Discharge Instructions  You were seen and examined today by Dr. Delton Coombes.  You were referred to Dr. Delton Coombes due to elevated white blood cells. White blood cells are the blood cell that are responsible for fighting off infections. They can be elevated in the presence of infections or due to medications and even smoking; however, your has not been consistently high.  Dr. Delton Coombes has recommended additional labs today, including a CBC to recheck the white blood cell count.  Follow-up as scheduled.  Thank you for choosing [facility] to provide your oncology and hematology care.   To afford each patient quality time with our provider, please arrive at least 15 minutes before your scheduled appointment time. You may need to reschedule your appointment if you arrive late (10 or more minutes). Arriving late affects you and other patients whose appointments are after yours.  Also, if you miss three or more appointments without notifying the office, you may be dismissed from the clinic at the provider's discretion.    Again, thank you for choosing [facility].  Our hope is that these requests will decrease the amount of time that you wait before being seen by our physicians.   If you have a lab appointment with the [facility] please come in thru the Main Entrance and check in at the main information desk.           _____________________________________________________________  Should you have questions after your visit to [facility], please contact our office at [phone] and follow the prompts.  Our office hours are 8:00 a.m. to 4:30 p.m. Monday - Thursday and 8:00 a.m. to 2:30 p.m. Friday.  Please note that voicemails left after 4:00 p.m. may not be returned until the following business day.  We are closed weekends and all major holidays.  You do have access to a nurse 24-7, just call the main  number to the clinic [phone] and do not press any options, hold on the line and a nurse will answer the phone.    For prescription refill requests, have your pharmacy contact our office and allow 72 hours.    Masks are optional in the cancer centers. If you would like for your care team to wear a mask while they are taking care of you, please let them know. You may have one support person who is at least 51 years old accompany you for your appointments.

## 2022-07-25 NOTE — Progress Notes (Signed)
CONSULT NOTE  Patient Care Team: Fayrene Helper, MD as PCP - General Derek Jack, MD as Medical Oncologist (Hematology)  CHIEF COMPLAINTS/PURPOSE OF CONSULTATION:  Leukocytosis.  HISTORY OF PRESENTING ILLNESS:  [name] 52 y.o. female is seen in consultation today at the request of Dr. Moshe Cipro for further workup of leukocytosis.  CBC on 06/18/2022 with white count 12.8.  Absolute neutrophil count was elevated.  Hemoglobin and platelet count was normal.  She reports that these labs were done about a week after COVID infection.  Further review of previous labs showed elevated white count in February 2020.  She received Depo-Medrol on 04/02/2022 and developed some rash on the lower back and legs.  She was recently diagnosed with rheumatoid arthritis and follows with Dr. Estanislado Pandy.  She is ready to start Plaquenil.  She mostly works from home.  Denies any chemical exposure.  No B symptoms were noted.  MEDICAL HISTORY:  Past Medical History:  Diagnosis Date   Back pain    BV (bacterial vaginosis) 09/16/2013   Chest pain 2000   right    Contraceptive management 09/16/2013   Encephalocele ([place]) 1994   Enlarged thyroid    HSV-2 (herpes simplex virus 2) infection    Hypertension    Irregular intermenstrual bleeding 04/27/2015   Obesity    Preterm labor    Sleep disorder    Thyroid nodule    Urinary frequency 12/14/2014   Vaginal discharge 09/16/2013    SURGICAL HISTORY: Past Surgical History:  Procedure Laterality Date   BREAST BIOPSY  2007   btl  [place] N/A 05/06/2016   Procedure: DILATATION & CURETTAGE/HYSTEROSCOPY WITH NOVASURE ABLATION;  Surgeon: Jonnie Kind, MD;  Location: [facility];  Service: Gynecology;  Laterality: N/A;  length-5; width-2.6; power-72 ; time- 1 minute 42 seconds   LAPAROSCOPIC BILATERAL SALPINGECTOMY Bilateral 05/06/2016   Procedure: LAPAROSCOPIC BILATERAL SALPINGECTOMY;  Surgeon: Jonnie Kind, MD;  Location: [facility];  Service: Gynecology;  Laterality: Bilateral;   RETAINED PLACENTA REMOVAL     TUBAL LIGATION     04/2016   tubal ligation reversal  2001   WISDOM TOOTH EXTRACTION      SOCIAL HISTORY: Social History   Socioeconomic History   Marital status: Divorced    Spouse name: Not on file   Number of children: 4   Years of education: Not on file   Highest education level: Not on file  Occupational History   Not on file  Tobacco Use   Smoking status: Never    Passive exposure: Never   Smokeless tobacco: Never  Vaping Use   Vaping Use: Never used  Substance and Sexual Activity   Alcohol use: No   Drug use: No   Sexual activity: Yes    Partners: Male    Birth control/protection: Surgical    Comment: tubal and ablation  Other Topics Concern   Not on file  Social History Narrative   Not on file   Social Determinants of Health   Financial Resource Strain: Low Risk  (02/05/2022)   Overall Financial Resource Strain (CARDIA)    Difficulty of Paying Living Expenses: Not hard at all  Food Insecurity: No Food Insecurity (07/25/2022)   Hunger Vital Sign    Worried About Running Out of Food in the Last Year: Never true    Delight in the Last Year: Never true  Transportation Needs: No Transportation Needs (07/25/2022)   [place] -  Hydrologist (Medical): No    Lack of Transportation (Non-Medical): No  Physical Activity: Insufficiently Active (02/05/2022)   Exercise Vital Sign    Days of Exercise per Week: 3 days    Minutes of Exercise per Session: 30 min  Stress: No Stress Concern Present (02/05/2022)   [place]    Feeling of Stress : Not at all  Social Connections: Moderately Integrated (02/05/2022)   Social Connection and Isolation Panel [NHANES]    Frequency of Communication with Friends and Family: More than three times a week    Frequency of Social  Gatherings with Friends and Family: Twice a week    Attends Religious Services: More than 4 times per year    Active Member of [employer] or Organizations: Yes    Attends Archivist Meetings: 1 to 4 times per year    Marital Status: Divorced  Human resources officer Violence: Not At Risk (07/25/2022)   Humiliation, Afraid, Rape, and Kick questionnaire    Fear of Current or Ex-Partner: No    Emotionally Abused: No    Physically Abused: No    Sexually Abused: No    FAMILY HISTORY: Family History  Problem Relation Age of Onset   Anuerysm Mother    Stroke Mother    Cancer Father        thyroid    Hypertension Father    Cancer Paternal Grandmother        breast   Seizures Maternal Grandmother     ALLERGIES:  is allergic to biaxin [clarithromycin], iodinated contrast media, depo-medrol [methylprednisolone], shellfish allergy, yellow jacket venom [bee venom], and peanut-containing drug products.  MEDICATIONS:  Current Outpatient Medications  Medication Sig Dispense Refill   cetirizine HCl (ZYRTEC) 1 MG/ML solution TAKE 1/2 TO 1 TEASPOON BY MOUTH DAILY AS , NEEDED, FOR ALLERGIES (OTC) 120 mL 3   diltiazem (CARDIZEM CD) 120 MG 24 hr capsule Take 1 capsule (120 mg total) by mouth daily. 90 capsule 2   hydrochlorothiazide (HYDRODIURIL) 25 MG tablet TAKE 1 TABLET (25 MG TOTAL) BY MOUTH DAILY. 90 tablet 3   hydroxychloroquine (PLAQUENIL) 200 MG tablet Take 1 tablet 200 mg BID Monday-Friday 40 tablet 2   metoprolol tartrate (LOPRESSOR) 25 MG tablet TAKE 1/2 TABLET BY MOUTH TWICE DAILY 90 tablet 0   potassium chloride SA (KLOR-CON M20) 20 MEQ tablet TAKE 2 TABLETS BY MOUTH TWICE A DAY 720 tablet 2   No current facility-administered medications for this visit.    REVIEW OF SYSTEMS:   Constitutional: Denies fevers, chills or abnormal night sweats Eyes: Denies blurriness of vision, double vision or watery eyes Ears, nose, mouth, throat, and face: Denies mucositis or sore throat Respiratory:  Denies cough, dyspnea or wheezes Cardiovascular: Denies palpitation, chest discomfort or lower extremity swelling Gastrointestinal:  Denies nausea, heartburn or change in bowel habits Skin: Denies abnormal skin rashes Lymphatics: Denies new lymphadenopathy or easy bruising Neurological: Some tingling in the right arm and hand. Behavioral/Psych: Mood is stable, no new changes  All other systems were reviewed with the patient and are negative.  PHYSICAL EXAMINATION: ECOG PERFORMANCE STATUS: 0 - Asymptomatic  Vitals:   07/25/22 0936  BP: (!) 126/90  Pulse: (!) 58  Resp: 17  Temp: 98.7 F (37.1 C)  SpO2: 99%   Filed Weights   07/25/22 0936  Weight: 171 lb 12.8 oz (77.9 kg)    GENERAL:alert, no distress and comfortable SKIN: skin color, texture,  turgor are normal, no rashes or significant lesions EYES: normal, conjunctiva are pink and non-injected, sclera clear OROPHARYNX:no exudate, no erythema and lips, buccal mucosa, and tongue normal  NECK: supple, thyroid normal size, non-tender, without nodularity LYMPH:  no palpable lymphadenopathy in the cervical, axillary or inguinal LUNGS: clear to auscultation and percussion with normal breathing effort HEART: regular rate & rhythm and no murmurs and no lower extremity edema ABDOMEN:abdomen soft, non-tender and normal bowel sounds Musculoskeletal:no cyanosis of digits and no clubbing  PSYCH: alert & oriented x 3 with fluent speech NEURO: no focal motor/sensory deficits  LABORATORY DATA:  I have reviewed the data as listed Recent Results (from the past 2160 hour(s))  BMP8+EGFR     Status: Abnormal   Collection Time: 05/02/22  8:28 AM  Result Value Ref Range   Glucose 80 70 - 99 mg/dL   BUN 13 6 - 24 mg/dL   Creatinine, Ser 1.09 (H) 0.57 - 1.00 mg/dL   eGFR 62 >59 mL/min/1.73   BUN/Creatinine Ratio 12 9 - 23   Sodium 141 134 - 144 mmol/L   Potassium 2.8 (L) 3.5 - 5.2 mmol/L   Chloride 99 96 - 106 mmol/L   CO2 26 20 - 29 mmol/L    Calcium 9.3 8.7 - 10.2 mg/dL  Rheumatoid Factor     Status: Abnormal   Collection Time: 05/02/22  8:28 AM  Result Value Ref Range   Rhuematoid fact SerPl-aCnc 175.8 (H) <14.0 IU/mL    Comment: Results confirmed on dilution.   Sed Rate (ESR)     Status: None   Collection Time: 05/02/22  8:28 AM  Result Value Ref Range   Sed Rate 37 0 - 40 mm/hr  Novel Coronavirus, NAA ([facility])     Status: Abnormal   Collection Time: 06/02/22  4:06 PM   Specimen: Nasopharyngeal(NP) swabs in vial transport medium  Result Value Ref Range   SARS-CoV-2, NAA Detected (A) Not Detected    Comment: Patients who have a positive COVID-19 test result may now have treatment options. Treatment options are available for patients with mild to moderate symptoms and for hospitalized patients. Visit our website at [email or web address] for resources and information. This nucleic acid amplification test was developed and its performance characteristics determined by [employer]. Nucleic acid amplification tests include RT-PCR and TMA. This test has not been FDA cleared or approved. This test has been authorized by FDA under an Emergency Use Authorization (EUA). This test is only authorized for the duration of time the declaration that circumstances exist justifying the authorization of the emergency use of in vitro diagnostic tests for detection of SARS-CoV-2 virus and/or diagnosis of COVID-19 infection under section 564(b)(1) of the Act, 21 U.S.C. 191YOM-6(Y) (1), unless the authorization is terminated or revoked sooner. When diagnostic testing is negativ e, the possibility of a false negative result should be considered in the context of a patient's recent exposures and the presence of clinical signs and symptoms consistent with COVID-19. An individual without symptoms of COVID-19 and who is not shedding SARS-CoV-2 virus would expect to have a negative (not detected) result in this assay.    POCT Influenza A/B     Status: Normal   Collection Time: 06/02/22  4:08 PM  Result Value Ref Range   Influenza A, POC Negative Negative   Influenza B, POC Negative Negative  Rheumatoid factor     Status: Abnormal   Collection Time: 06/12/22 11:13 AM  Result Value Ref Range   Rhuematoid fact SerPl-aCnc  165 (H) <28 IU/mL  Cyclic citrul peptide antibody, IgG     Status: None   Collection Time: 06/12/22 11:13 AM  Result Value Ref Range   Cyclic Citrullin Peptide Ab <16 UNITS    Comment: Reference Range Negative:            <20 Weak Positive:       20-39 Moderate Positive:   40-59 Strong Positive:     >59 .   Sedimentation rate     Status: None   Collection Time: 06/12/22 11:13 AM  Result Value Ref Range   Sed Rate 14 0 - 20 mm/h  C-reactive protein     Status: None   Collection Time: 06/12/22 11:13 AM  Result Value Ref Range   CRP 4.5 <8.0 mg/L  CK     Status: None   Collection Time: 06/12/22 11:13 AM  Result Value Ref Range   Total CK 105 29 - 143 U/L  TSH     Status: None   Collection Time: 06/12/22 11:13 AM  Result Value Ref Range   TSH 1.54 mIU/L    Comment:           Reference Range .           > or = 20 Years  0.40-4.50 .                Pregnancy Ranges           First trimester    0.26-2.66           Second trimester   0.55-2.73           Third trimester    0.43-2.91   CBC with Differential/Platelet     Status: Abnormal   Collection Time: 06/18/22 10:09 AM  Result Value Ref Range   WBC 12.8 (H) 3.8 - 10.8 Thousand/uL   RBC 5.72 (H) 3.80 - 5.10 Million/uL   Hemoglobin 12.7 11.7 - 15.5 g/dL   HCT 39.9 35.0 - 45.0 %   MCV 69.8 (L) 80.0 - 100.0 fL    Comment: Verified by repeat analysis. Marland Kitchen    MCH 22.2 (L) 27.0 - 33.0 pg   MCHC 31.8 (L) 32.0 - 36.0 g/dL   RDW 15.7 (H) 11.0 - 15.0 %   Platelets 360 140 - 400 Thousand/uL   MPV 12.2 7.5 - 12.5 fL   Neutro Abs 10,227 (H) 1,500 - 7,800 cells/uL   Lymphs Abs 1,498 850 - 3,900 cells/uL   Absolute Monocytes 845  200 - 950 cells/uL   Eosinophils Absolute 166 15 - 500 cells/uL   Basophils Absolute 64 0 - 200 cells/uL   Neutrophils Relative % 79.9 %   Total Lymphocyte 11.7 %   Monocytes Relative 6.6 %   Eosinophils Relative 1.3 %   Basophils Relative 0.5 %  COMPLETE METABOLIC PANEL WITH GFR     Status: Abnormal   Collection Time: 06/18/22 10:09 AM  Result Value Ref Range   Glucose, Bld 73 65 - 99 mg/dL    Comment: .            Fasting reference interval .    BUN 12 7 - 25 mg/dL   Creat 0.93 0.50 - 1.03 mg/dL   eGFR 75 > OR = 60 mL/min/1.55m   BUN/Creatinine Ratio SEE NOTE: 6 - 22 (calc)    Comment:    Not Reported: BUN and Creatinine are within    reference range. .    Sodium 139 135 -  146 mmol/L   Potassium 3.1 (L) 3.5 - 5.3 mmol/L   Chloride 99 98 - 110 mmol/L   CO2 28 20 - 32 mmol/L   Calcium 9.7 8.6 - 10.4 mg/dL   Total Protein 7.9 6.1 - 8.1 g/dL   Albumin 4.4 3.6 - 5.1 g/dL   Globulin 3.5 1.9 - 3.7 g/dL (calc)   AG Ratio 1.3 1.0 - 2.5 (calc)   Total Bilirubin 0.6 0.2 - 1.2 mg/dL   Alkaline phosphatase (APISO) 91 37 - 153 U/L   AST 14 10 - 35 U/L   ALT 15 6 - 29 U/L  Glucose 6 phosphate dehydrogenase     Status: None   Collection Time: 06/18/22 10:09 AM  Result Value Ref Range   G-6PDH 17.1 7.0 - 20.5 U/g Hgb  Lactate dehydrogenase     Status: None   Collection Time: 07/25/22 10:02 AM  Result Value Ref Range   LDH 111 98 - 192 U/L    Comment: Performed at [facility], [address], [place], [place] [address]  Reticulocytes     Status: Abnormal   Collection Time: 07/25/22 10:02 AM  Result Value Ref Range   Retic Ct Pct 1.0 0.4 - 3.1 %   RBC. 5.59 (H) 3.87 - 5.11 MIL/uL   Retic Count, Absolute 58.1 19.0 - 186.0 K/uL   Immature Retic Fract 10.6 2.3 - 15.9 %    Comment: Performed at [facility], [address], [place], [place] [address]  CBC with Differential     Status: Abnormal   Collection Time: 07/25/22 10:02 AM  Result Value Ref Range   WBC 9.3 4.0 - 10.5 K/uL    RBC 5.70 (H) 3.87 - 5.11 MIL/uL   Hemoglobin 12.6 12.0 - 15.0 g/dL   HCT 40.7 36.0 - 46.0 %   MCV 71.4 (L) 80.0 - 100.0 fL   MCH 22.1 (L) 26.0 - 34.0 pg   MCHC 31.0 30.0 - 36.0 g/dL   RDW 15.1 11.5 - 15.5 %   Platelets 338 150 - 400 K/uL   nRBC 0.0 0.0 - 0.2 %   Neutrophils Relative % 73 %   Neutro Abs 6.8 1.7 - 7.7 K/uL   Lymphocytes Relative 17 %   Lymphs Abs 1.6 0.7 - 4.0 K/uL   Monocytes Relative 7 %   Monocytes Absolute 0.6 0.1 - 1.0 K/uL   Eosinophils Relative 2 %   Eosinophils Absolute 0.2 0.0 - 0.5 K/uL   Basophils Relative 1 %   Basophils Absolute 0.1 0.0 - 0.1 K/uL   Immature Granulocytes 0 %   Abs Immature Granulocytes 0.03 0.00 - 0.07 K/uL    Comment: Performed at [facility], [address] [place], [place] [address]    RADIOGRAPHIC STUDIES: I have personally reviewed the radiological images as listed and agreed with the findings in the report. MM 3D SCREEN BREAST BILATERAL  Result Date: 07/10/2022 CLINICAL DATA:  Screening. EXAM: DIGITAL SCREENING BILATERAL MAMMOGRAM WITH TOMOSYNTHESIS AND CAD TECHNIQUE: Bilateral screening digital craniocaudal and mediolateral oblique mammograms were obtained. Bilateral screening digital breast tomosynthesis was performed. The images were evaluated with computer-aided detection. COMPARISON:  Previous exam(s). ACR Breast Density Category c: The breast tissue is heterogeneously dense, which may obscure small masses. FINDINGS: There are no findings suspicious for malignancy. IMPRESSION: No mammographic evidence of malignancy. A result letter of this screening mammogram will be mailed directly to the patient. RECOMMENDATION: Screening mammogram in one year. (Code:SM-B-01Y) BI-RADS CATEGORY  1: Negative. Electronically Signed  By: Dorise Bullion III M.D.   On: 07/10/2022 13:05    ASSESSMENT:  1.  Neutrophilic leukocytosis: - Patient seen at the request of Dr. Moshe Cipro. - CBC (06/18/2022): WBC-12.8, ANC-10.2.  Hb-12.7, PLT-360.   MCV-69 - CBC (09/02/2018): WBC-12.3. - No B symptoms or recurrent infections.  No systemic steroids. - She lost 5 pounds in the last 19 days as she is eating completely vegetarian diet.  2.  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  She is a non-smoker.  No chemical exposure. - No connective tissue disorders, sickle cell or thalassemia trait.  No leukemias. - Father had thyroid cancer.  Daughter had thyroid cancer at age 71.  Paternal great grandmother had breast cancer.  3.  Rheumatoid arthritis: - Predominantly affects right elbow, wrist and fingers.  Follows with Dr. Estanislado Pandy. - She will start Plaquenil.   PLAN:  1.  Neutrophilic leukocytosis: - Most likely reactive.  Will check her CBC today along with LDH and reticulocyte count. - If it continues to be high, will consider checking for myeloproliferative neoplasms.  2.  Microcytosis: - She has microcytosis without anemia.  RBC count is elevated.  This is suggestive of thalassemia trait.  Will check hemoglobin electrophoresis.   All questions were answered. The patient knows to call the clinic with any problems, questions or concerns.      Derek Jack, MD 07/25/22 4:38 PM

## 2022-07-28 ENCOUNTER — Telehealth: Payer: Self-pay | Admitting: Family Medicine

## 2022-07-28 NOTE — Telephone Encounter (Signed)
Received [employer] forms by fax   Noted/copied/sleeved & given to provider

## 2022-07-29 LAB — HGB FRACTIONATION CASCADE
Hgb A2: 2.3 % (ref 1.8–3.2)
Hgb A: 97.7 % (ref 96.4–98.8)
Hgb F: 0 % (ref 0.0–2.0)
Hgb S: 0 %

## 2022-07-30 ENCOUNTER — Encounter: Payer: Self-pay | Admitting: Physician Assistant

## 2022-07-30 ENCOUNTER — Telehealth: Payer: Self-pay | Admitting: *Deleted

## 2022-07-30 ENCOUNTER — Ambulatory Visit: Payer: BC Managed Care – PPO | Attending: Physician Assistant | Admitting: Physician Assistant

## 2022-07-30 VITALS — BP 130/84 | HR 56 | Resp 15 | Ht 66.0 in | Wt 172.4 lb

## 2022-07-30 DIAGNOSIS — M19042 Primary osteoarthritis, left hand: Secondary | ICD-10-CM

## 2022-07-30 DIAGNOSIS — M79601 Pain in right arm: Secondary | ICD-10-CM

## 2022-07-30 DIAGNOSIS — M7918 Myalgia, other site: Secondary | ICD-10-CM

## 2022-07-30 DIAGNOSIS — M79642 Pain in left hand: Secondary | ICD-10-CM

## 2022-07-30 DIAGNOSIS — M2242 Chondromalacia patellae, left knee: Secondary | ICD-10-CM

## 2022-07-30 DIAGNOSIS — M0579 Rheumatoid arthritis with rheumatoid factor of multiple sites without organ or systems involvement: Secondary | ICD-10-CM | POA: Diagnosis not present

## 2022-07-30 DIAGNOSIS — M19041 Primary osteoarthritis, right hand: Secondary | ICD-10-CM

## 2022-07-30 DIAGNOSIS — Z79899 Other long term (current) drug therapy: Secondary | ICD-10-CM | POA: Diagnosis not present

## 2022-07-30 DIAGNOSIS — M5412 Radiculopathy, cervical region: Secondary | ICD-10-CM

## 2022-07-30 DIAGNOSIS — M2241 Chondromalacia patellae, right knee: Secondary | ICD-10-CM

## 2022-07-30 DIAGNOSIS — M25511 Pain in right shoulder: Secondary | ICD-10-CM | POA: Diagnosis not present

## 2022-07-30 DIAGNOSIS — M79641 Pain in right hand: Secondary | ICD-10-CM | POA: Diagnosis not present

## 2022-07-30 DIAGNOSIS — E559 Vitamin D deficiency, unspecified: Secondary | ICD-10-CM

## 2022-07-30 DIAGNOSIS — I10 Essential (primary) hypertension: Secondary | ICD-10-CM

## 2022-07-30 DIAGNOSIS — R7303 Prediabetes: Secondary | ICD-10-CM

## 2022-07-30 DIAGNOSIS — F4323 Adjustment disorder with mixed anxiety and depressed mood: Secondary | ICD-10-CM

## 2022-07-30 DIAGNOSIS — M19072 Primary osteoarthritis, left ankle and foot: Secondary | ICD-10-CM

## 2022-07-30 DIAGNOSIS — M19071 Primary osteoarthritis, right ankle and foot: Secondary | ICD-10-CM

## 2022-07-30 MED ORDER — METHOCARBAMOL 500 MG PO TABS: 500.0000 mg | ORAL_TABLET | Freq: Every day | ORAL | 0 refills | Status: DC | PRN

## 2022-07-30 NOTE — Telephone Encounter (Signed)
Patient called the office and states she saw orthopedics today. Patient states she saw Merlene Pulling, PA with [facility] in [place]. Patient states Virgel Bouquet thinks she has a "cracked clavicle". Patient states she has x-rays today and they plan for her to have an MRI. Patient states Virgel Bouquet advised her that she should remain out of work for another 4 weeks. Patient states Virgel Bouquet will take over her paperwork. She then mentions they may also send it here and we will need to complete it. I explained to patient that since we are not making the recommendation for her to remain out of work we will not be able to complete the paperwork.

## 2022-07-31 ENCOUNTER — Telehealth: Payer: Self-pay | Admitting: Adult Health

## 2022-07-31 MED ORDER — FLUCONAZOLE 150 MG PO TABS: ORAL_TABLET | ORAL | 1 refills | Status: DC

## 2022-07-31 NOTE — Addendum Note (Signed)
Addended by: Derrek Monaco A on: 07/31/2022 12:12 PM   Modules accepted: Orders

## 2022-07-31 NOTE — Telephone Encounter (Signed)
Will rx diflucan  

## 2022-07-31 NOTE — Telephone Encounter (Addendum)
Pt is requesting a refill on Diflucan. Thanks! [place]

## 2022-07-31 NOTE — Telephone Encounter (Signed)
Patient would like a call. Patient declined leaving a more detailed message. Please advise.

## 2022-07-31 NOTE — Telephone Encounter (Signed)
Left message @ 10:52 am. Advised can send a mychart message. [place]

## 2022-08-01 ENCOUNTER — Telehealth: Payer: Self-pay | Admitting: Family Medicine

## 2022-08-01 ENCOUNTER — Encounter: Payer: Self-pay | Admitting: Family Medicine

## 2022-08-01 ENCOUNTER — Telehealth: Payer: Self-pay | Admitting: Rheumatology

## 2022-08-01 ENCOUNTER — Telehealth: Payer: Self-pay | Admitting: *Deleted

## 2022-08-01 NOTE — Telephone Encounter (Signed)
Pt faxed in pwk to be completed   Noted/copied/sleeved

## 2022-08-01 NOTE — Telephone Encounter (Signed)
Pt requested a call back to discuss orthopedic surgeon feedback with Dr. Estanislado Pandy. She stated her disability paperwork date needs to be corrected prior to next appt on February 7th with another doctor. She can be reached at [phone].

## 2022-08-01 NOTE — Telephone Encounter (Signed)
See mychart msg

## 2022-08-01 NOTE — Telephone Encounter (Signed)
I called patient regarding disability paper work from [employer]. Per Hazel Sams, PA, we will not be completing her disability paper work since patient had appt 07/30/2022 with orthopedic doctor, who should continue disability if needed. Patient verbalized understanding. Patient has f/u appt and MRI scheduled with ortho., referred by PCP.

## 2022-08-01 NOTE — Telephone Encounter (Signed)
Patient called said she needs her FMLA to be extended til 02.07.2024 scheduled an MRI 02.01.2024 on right shoulder fractured. Please contact patient regarding FMLA extended. [phone].

## 2022-08-05 ENCOUNTER — Telehealth: Payer: Self-pay | Admitting: Family Medicine

## 2022-08-05 NOTE — Telephone Encounter (Signed)
Pt returning call

## 2022-08-06 NOTE — Telephone Encounter (Signed)
Lmtrc

## 2022-08-07 DIAGNOSIS — M25511 Pain in right shoulder: Secondary | ICD-10-CM | POA: Diagnosis not present

## 2022-08-08 ENCOUNTER — Telehealth: Payer: Self-pay | Admitting: Family Medicine

## 2022-08-08 NOTE — Telephone Encounter (Signed)
Patient called, she is checking the status of her disability forms.  Claim # [id number] and fax # [phone].  Pt's # [phone]

## 2022-08-08 NOTE — Telephone Encounter (Signed)
Paperwork in Dr [employer] box awaiting her part of the forms.

## 2022-08-11 NOTE — Telephone Encounter (Signed)
[name], please see dates [employer] is asking to be put on her new form, 01/19 to 07/30/2022, please complete the form with this new info, I will sign, thanks

## 2022-08-12 ENCOUNTER — Other Ambulatory Visit: Payer: Self-pay | Admitting: Family Medicine

## 2022-08-13 DIAGNOSIS — Z0279 Encounter for issue of other medical certificate: Secondary | ICD-10-CM

## 2022-08-13 DIAGNOSIS — M25511 Pain in right shoulder: Secondary | ICD-10-CM | POA: Diagnosis not present

## 2022-08-13 NOTE — Telephone Encounter (Signed)
Spoke with patient and verified dates for fmla paperwork

## 2022-08-18 ENCOUNTER — Ambulatory Visit: Payer: BC Managed Care – PPO | Admitting: Physician Assistant

## 2022-08-21 ENCOUNTER — Ambulatory Visit: Payer: BC Managed Care – PPO | Admitting: Family Medicine

## 2022-08-23 NOTE — Progress Notes (Unsigned)
[name], [name]   CLINIC:  Medical Oncology/Hematology  PCP:  Fayrene Helper, MD [address], [place] [address] [phone]   REASON FOR VISIT:  Follow-up for leukocytosis  PRIOR THERAPY: None  CURRENT THERAPY: Under workup  INTERVAL HISTORY:   [name] 52 y.o. female returns for routine follow-up of leukocytosis.  She was seen for initial consultation by Dr. Delton Coombes on 07/25/2022.  At today's visit, she reports feeling well.  She denies any changes in her symptoms or baseline health status since her initial visit last month.  She is not currently on any steroid medications, but received Depo-Medrol on 04/02/2022 (followed by rash to her low back and legs).  She was recently diagnosed with rheumatoid arthritis, following with Dr. Estanislado Pandy and recently started on Plaquenil, but stopped it 2 weeks ago due to chest pain.  She reports COVID infection November 2023, but denies any other recent infections.  She denies any unexplained fever, chills, night sweats, or weight loss. She denies any lymphadenopathy or masses.  She has 85% energy and 100% appetite. She endorses that she is maintaining a stable weight.  ASSESSMENT & PLAN:  1.  Neutrophilic leukocytosis - Intermittently elevated WBC since February 2020, predominantly neutrophils - Currently on any systemic steroid medications, but received Depo-Medrol on 04/02/2022  - Follows with Dr. Estanislado Pandy for rheumatoid arthritis, recently started on Plaquenil, but stopped about 2 weeks ago due to chest pain. - COVID infection November 2023, but denies any other recent infections.   - She denies any B symptoms, but has intentionally lost weight in the last few months due to starting plant-based diet  - She is a lifelong non-smoker - No lymphadenopathy, masses, or splenomegaly on exam.   - Most recent CBC/differential (07/25/2022) with normal WBC 9.3, normal  differential.  LDH normal. - PLAN: Intermittent leukocytosis in the setting of known rheumatoid arthritis favors REACTIVE LEUKOCYTOSIS.  We will continue to monitor with periodic labs over the next year and consider additional workup if persistently elevated WBC.  If she demonstrates long-term clinical stability, would consider discharge to PCP with strict return precautions. - Labs in 6 months (CBC/D, LDH, ESR, CRP) - Office visit after labs  2.  Microcytosis - She has microcytosis without anemia.  RBC count is elevated, which is suggestive of thalassemia trait. - Hemoglobin electrophoresis cascade normal, but this does not rule out alpha thalassemia  - PLAN: Strong possibility of alpha thalassemia trait discussed with patient.  She would like to hold off on alpha thalassemia genotype testing at this time, since it would not change any decisions about her health at this time.  3.  Rheumatoid arthritis - Predominantly affects right elbow, wrist, and fingers - Follows with Dr. Estanislado Pandy - Stopped Plaquenil due to chest pain  4.  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 - She is a non-smoker.  No chemical exposure. - No family history of connective tissue disorders, sickle cell or thalassemia trait.  No leukemias. - Father had thyroid cancer.  Daughter had thyroid cancer at age 29.  Paternal great grandmother had breast cancer.  PLAN SUMMARY: >> Labs in 6 months (CBC/D, LDH, ESR, CRP) >> OFFICE visit after labs     REVIEW OF SYSTEMS:   Review of Systems  Constitutional:  Negative for appetite change, chills, diaphoresis, fatigue, fever and unexpected weight change.  HENT:   Negative for lump/mass  and nosebleeds.   Eyes:  Negative for eye problems.  Respiratory:  Negative for cough, hemoptysis and shortness of breath.   Cardiovascular:  Negative for chest pain, leg swelling and palpitations.  Gastrointestinal:  Negative for abdominal pain,  blood in stool, constipation, diarrhea, nausea and vomiting.  Genitourinary:  Negative for hematuria.   Skin: Negative.   Neurological:  Negative for dizziness, headaches and light-headedness.  Hematological:  Does not bruise/bleed easily.     PHYSICAL EXAM:  ECOG PERFORMANCE STATUS: 0 - Asymptomatic There were no vitals filed for this visit. There were no vitals filed for this visit. Physical Exam Constitutional:      Appearance: Normal appearance.  HENT:     Head: Normocephalic and atraumatic.     Mouth/Throat:     Mouth: Mucous membranes are moist.  Eyes:     Extraocular Movements: Extraocular movements intact.     Pupils: Pupils are equal, round, and reactive to light.  Cardiovascular:     Rate and Rhythm: Normal rate and regular rhythm.     Pulses: Normal pulses.     Heart sounds: Normal heart sounds.  Pulmonary:     Effort: Pulmonary effort is normal.     Breath sounds: Normal breath sounds.  Abdominal:     General: Bowel sounds are normal.     Palpations: Abdomen is soft.     Tenderness: There is no abdominal tenderness.  Musculoskeletal:        General: No swelling.     Right lower leg: No edema.     Left lower leg: No edema.  Lymphadenopathy:     Cervical: No cervical adenopathy.  Skin:    General: Skin is warm and dry.  Neurological:     General: No focal deficit present.     Mental Status: She is alert and oriented to person, place, and time.  Psychiatric:        Mood and Affect: Mood normal.        Behavior: Behavior normal.     PAST MEDICAL/SURGICAL HISTORY:  Past Medical History:  Diagnosis Date   Back pain    BV (bacterial vaginosis) 09/16/2013   Chest pain 2000   right    Contraceptive management 09/16/2013   Encephalocele ([place]) 1994   Enlarged thyroid    HSV-2 (herpes simplex virus 2) infection    Hypertension    Irregular intermenstrual bleeding 04/27/2015   Obesity    Preterm labor    Sleep disorder    Thyroid nodule    Urinary  frequency 12/14/2014   Vaginal discharge 09/16/2013   Past Surgical History:  Procedure Laterality Date   BREAST BIOPSY  2007   btl  [place] N/A 05/06/2016   Procedure: DILATATION & CURETTAGE/HYSTEROSCOPY WITH NOVASURE ABLATION;  Surgeon: Jonnie Kind, MD;  Location: [facility];  Service: Gynecology;  Laterality: N/A;  length-5; width-2.6; power-72 ; time- 1 minute 42 seconds   LAPAROSCOPIC BILATERAL SALPINGECTOMY Bilateral 05/06/2016   Procedure: LAPAROSCOPIC BILATERAL SALPINGECTOMY;  Surgeon: Jonnie Kind, MD;  Location: [facility];  Service: Gynecology;  Laterality: Bilateral;   RETAINED PLACENTA REMOVAL     TUBAL LIGATION     04/2016   tubal ligation reversal  2001   WISDOM TOOTH EXTRACTION      SOCIAL HISTORY:  Social History   Socioeconomic History   Marital status: Divorced    Spouse name: Not on file   Number of children: 4  Years of education: Not on file   Highest education level: Not on file  Occupational History   Not on file  Tobacco Use   Smoking status: Never    Passive exposure: Never   Smokeless tobacco: Never  Vaping Use   Vaping Use: Never used  Substance and Sexual Activity   Alcohol use: No   Drug use: No   Sexual activity: Yes    Partners: Male    Birth control/protection: Surgical    Comment: tubal and ablation  Other Topics Concern   Not on file  Social History Narrative   Not on file   Social Determinants of Health   Financial Resource Strain: Low Risk  (02/05/2022)   Overall Financial Resource Strain (CARDIA)    Difficulty of Paying Living Expenses: Not hard at all  Food Insecurity: No Food Insecurity (07/25/2022)   Hunger Vital Sign    Worried About Running Out of Food in the Last Year: Never true    Ran Out of Food in the Last Year: Never true  Transportation Needs: No Transportation Needs (07/25/2022)   PRAPARE - Hydrologist (Medical): No    Lack of  Transportation (Non-Medical): No  Physical Activity: Insufficiently Active (02/05/2022)   Exercise Vital Sign    Days of Exercise per Week: 3 days    Minutes of Exercise per Session: 30 min  Stress: No Stress Concern Present (02/05/2022)   [place]    Feeling of Stress : Not at all  Social Connections: Moderately Integrated (02/05/2022)   Social Connection and Isolation Panel [NHANES]    Frequency of Communication with Friends and Family: More than three times a week    Frequency of Social Gatherings with Friends and Family: Twice a week    Attends Religious Services: More than 4 times per year    Active Member of [employer] or Organizations: Yes    Attends Archivist Meetings: 1 to 4 times per year    Marital Status: Divorced  Human resources officer Violence: Not At Risk (07/25/2022)   Humiliation, Afraid, Rape, and Kick questionnaire    Fear of Current or Ex-Partner: No    Emotionally Abused: No    Physically Abused: No    Sexually Abused: No    FAMILY HISTORY:  Family History  Problem Relation Age of Onset   Anuerysm Mother    Stroke Mother    Cancer Father        thyroid    Hypertension Father    Cancer Paternal Grandmother        breast   Seizures Maternal Grandmother     CURRENT MEDICATIONS:  Outpatient Encounter Medications as of 08/25/2022  Medication Sig Note   cetirizine HCl (ZYRTEC) 1 MG/ML solution TAKE 1/2 TO 1 TEASPOON BY MOUTH DAILY AS , NEEDED, FOR ALLERGIES (OTC)    diltiazem (CARDIZEM CD) 120 MG 24 hr capsule Take 1 capsule (120 mg total) by mouth daily.    fluconazole (DIFLUCAN) 150 MG tablet Take 1 now and 1 in 3 days if needed    hydrochlorothiazide (HYDRODIURIL) 25 MG tablet TAKE 1 TABLET (25 MG TOTAL) BY MOUTH DAILY.    hydroxychloroquine (PLAQUENIL) 200 MG tablet Take 1 tablet 200 mg BID Monday-Friday    methocarbamol (ROBAXIN) 500 MG tablet Take 1 tablet (500 mg total) by mouth daily as  needed for muscle spasms.    metoprolol tartrate (LOPRESSOR) 25 MG tablet TAKE 1/2  TABLET BY MOUTH TWICE DAILY    potassium chloride SA (KLOR-CON M20) 20 MEQ tablet TAKE 2 TABLETS BY MOUTH TWICE A DAY 07/16/2022: Takes 4 /day   No facility-administered encounter medications on file as of 08/25/2022.    ALLERGIES:  Allergies  Allergen Reactions   Biaxin [Clarithromycin] Diarrhea, Nausea And Vomiting and Other (See Comments)    Blood in stool   Iodinated Contrast Media Shortness Of Breath    Pt states she had difficulty breathing after receiving iv contrast in 2013.    Depo-Medrol [Methylprednisolone] Itching    Generalized itching worse at injection site, no wheezing   Shellfish Allergy    Yellow Jacket Venom [Bee Venom] Swelling   Peanut-Containing Drug Products Rash    LABORATORY DATA:  I have reviewed the labs as listed.  CBC    Component Value Date/Time   WBC 9.3 07/25/2022 1002   RBC 5.59 (H) 07/25/2022 1002   RBC 5.70 (H) 07/25/2022 1002   HGB 12.6 07/25/2022 1002   HGB 12.7 09/10/2021 1335   HCT 40.7 07/25/2022 1002   HCT 39.8 09/10/2021 1335   PLT 338 07/25/2022 1002   PLT 350 09/10/2021 1335   MCV 71.4 (L) 07/25/2022 1002   MCV 70 (L) 09/10/2021 1335   MCH 22.1 (L) 07/25/2022 1002   MCHC 31.0 07/25/2022 1002   RDW 15.1 07/25/2022 1002   RDW 15.6 (H) 09/10/2021 1335   LYMPHSABS 1.6 07/25/2022 1002   MONOABS 0.6 07/25/2022 1002   EOSABS 0.2 07/25/2022 1002   BASOSABS 0.1 07/25/2022 1002      Latest Ref Rng & Units 06/18/2022   10:09 AM 05/02/2022    8:28 AM 04/02/2022    2:15 PM  CMP  Glucose 65 - 99 mg/dL 73  80  74   BUN 7 - 25 mg/dL 12  13  10   $ Creatinine 0.50 - 1.03 mg/dL 0.93  1.09  0.98   Sodium 135 - 146 mmol/L 139  141  144   Potassium 3.5 - 5.3 mmol/L 3.1  2.8  3.3   Chloride 98 - 110 mmol/L 99  99  103   CO2 20 - 32 mmol/L 28  26  26   $ Calcium 8.6 - 10.4 mg/dL 9.7  9.3  9.1   Total Protein 6.1 - 8.1 g/dL 7.9     Total Bilirubin 0.2 - 1.2  mg/dL 0.6     AST 10 - 35 U/L 14     ALT 6 - 29 U/L 15       DIAGNOSTIC IMAGING:  I have independently reviewed the relevant imaging and discussed with the patient.   WRAP UP:  All questions were answered. The patient knows to call the clinic with any problems, questions or concerns.  Medical decision making: Moderate  Time spent on visit: I spent 20 minutes counseling the patient face to face. The total time spent in the appointment was 30 minutes and more than 50% was on counseling.  Harriett Rush, PA-C  08/25/2022 3:36 PM

## 2022-08-25 ENCOUNTER — Inpatient Hospital Stay: Payer: BC Managed Care – PPO | Attending: Hematology | Admitting: Physician Assistant

## 2022-08-25 VITALS — BP 127/80 | HR 66 | Temp 98.1°F | Resp 18 | Ht 66.0 in | Wt 174.0 lb

## 2022-08-25 DIAGNOSIS — Z8616 Personal history of COVID-19: Secondary | ICD-10-CM | POA: Insufficient documentation

## 2022-08-25 DIAGNOSIS — D72825 Bandemia: Secondary | ICD-10-CM

## 2022-08-25 DIAGNOSIS — Z803 Family history of malignant neoplasm of breast: Secondary | ICD-10-CM | POA: Insufficient documentation

## 2022-08-25 DIAGNOSIS — Z808 Family history of malignant neoplasm of other organs or systems: Secondary | ICD-10-CM | POA: Insufficient documentation

## 2022-08-25 DIAGNOSIS — M069 Rheumatoid arthritis, unspecified: Secondary | ICD-10-CM | POA: Insufficient documentation

## 2022-08-25 DIAGNOSIS — R718 Other abnormality of red blood cells: Secondary | ICD-10-CM | POA: Diagnosis not present

## 2022-08-25 DIAGNOSIS — D72828 Other elevated white blood cell count: Secondary | ICD-10-CM | POA: Insufficient documentation

## 2022-08-25 NOTE — Patient Instructions (Signed)
[facility] **   You were seen today by Tarri Abernethy PA-C for your follow-up visit.    HIGH WHITE BLOOD CELLS Your elevated white blood cells are most likely reactive to inflammation from your rheumatoid arthritis. They have currently come back to normal, but you will likely have elevations "up-and-down" in the future. If you have any persistent and progressive elevation in your white blood cells we will consider checking additional test to rule out any genetic mutations or cancers because of high white blood cell.  POSSIBLE ALPHA THALASSEMIA TRAIT Your labs show small red blood cells (low MCV) and increased number of red blood cells (high RBC). This most likely is due to an inherited condition called alpha thalassemia trait. I suspect that you have one copy of genetic mutation causing this abnormality.  However, it is not any danger to you and is not causing any problems. If you would like [place] to test for this genetic mutation, this would not change anything in your particular health management, but may be information that you or your family would wish to know for future reproductive choices.  FOLLOW-UP APPOINTMENT: I will see you for labs and follow-up visit in 6 months  ** Thank you for trusting me with your healthcare!  I strive to provide all of my patients with quality care at each visit.  If you receive a survey for this visit, I would be so grateful to you for taking the time to provide feedback.  Thank you in advance!  ~ Chaze Hruska                   Dr. Derek Jack   &   Tarri Abernethy, PA-C   - - - - - - - - - - - - - - - - - -    Thank you for choosing [facility] to provide your oncology and hematology care.  To afford each patient quality time with our provider, please arrive at least 15 minutes before your scheduled appointment time.   If you have a  lab appointment with the [facility] please come in thru the Main Entrance and check in at the main information desk.  You need to re-schedule your appointment should you arrive 10 or more minutes late.  We strive to give you quality time with our providers, and arriving late affects you and other patients whose appointments are after yours.  Also, if you no show three or more times for appointments you may be dismissed from the clinic at the providers discretion.     Again, thank you for choosing [facility].  Our hope is that these requests will decrease the amount of time that you wait before being seen by our physicians.       _____________________________________________________________  Should you have questions after your visit to [facility], please contact our office at [phone] and follow the prompts.  Our office hours are 8:00 a.m. and 4:30 p.m. Monday - Friday.  Please note that voicemails left after 4:00 p.m. may not be returned until the following business day.  We are closed weekends and major holidays.  You do have access to a nurse 24-7, just call the main number to the clinic [phone] and do not press any options, hold on the line and a nurse will answer the phone.    For prescription  refill requests, have your pharmacy contact our office and allow 72 hours.

## 2022-08-26 ENCOUNTER — Other Ambulatory Visit: Payer: Self-pay

## 2022-08-26 DIAGNOSIS — D72825 Bandemia: Secondary | ICD-10-CM

## 2022-08-26 DIAGNOSIS — R718 Other abnormality of red blood cells: Secondary | ICD-10-CM

## 2022-08-28 NOTE — Progress Notes (Deleted)
Office Visit Note  Patient: [name]             Date of Birth: 06-12-1971           MRN: [id number]             PCP: Fayrene Helper, MD Referring: Fayrene Helper, MD Visit Date: 09/10/2022 Occupation: @GUAROCC$ @  Subjective:  No chief complaint on file.   History of Present Illness: [name] is a 52 y.o. female ***     Activities of Daily Living:  Patient reports morning stiffness for *** {minute/hour:19697}.   Patient {ACTIONS;DENIES/REPORTS:21021675::"Denies"} nocturnal pain.  Difficulty dressing/grooming: {ACTIONS;DENIES/REPORTS:21021675::"Denies"} Difficulty climbing stairs: {ACTIONS;DENIES/REPORTS:21021675::"Denies"} Difficulty getting out of chair: {ACTIONS;DENIES/REPORTS:21021675::"Denies"} Difficulty using hands for taps, buttons, cutlery, and/or writing: {ACTIONS;DENIES/REPORTS:21021675::"Denies"}  No Rheumatology ROS completed.   PMFS History:  Patient Active Problem List   Diagnosis Date Noted   RBC microcytosis 07/25/2022   Leukocytosis 07/25/2022   Chronic right shoulder pain 07/21/2022   Generalized pain 05/05/2022   Elevated rheumatoid factor 05/05/2022   Adjustment reaction with anxiety and depression 04/03/2022   Screen for STD (sexually transmitted disease) 02/05/2022   Acute low back pain without sciatica 11/28/2021   Cervical radiculopathy 11/28/2021   Benign skin mole 03/07/2020   Callus of foot 03/07/2020   Chronic superficial venous thrombosis of lower extremity 06/05/2019   Allergic reaction 12/07/2018   Multiple allergies 11/28/2015   Prediabetes 08/01/2012   Overweight (BMI 25.0-29.9) 08/01/2012   Vitamin D deficiency 07/28/2012   Allergic rhinitis 10/16/2009   Neck pain 03/17/2009   HYPOKALEMIA 03/14/2008   THYROID NODULE 10/26/2007   Essential hypertension 10/26/2007   Back pain with radiation 10/26/2007    Past Medical History:  Diagnosis Date   Back pain    BV (bacterial vaginosis) 09/16/2013   Chest pain 2000    right    Contraceptive management 09/16/2013   Encephalocele ([place]) 1994   Enlarged thyroid    HSV-2 (herpes simplex virus 2) infection    Hypertension    Irregular intermenstrual bleeding 04/27/2015   Obesity    Preterm labor    Sleep disorder    Thyroid nodule    Urinary frequency 12/14/2014   Vaginal discharge 09/16/2013    Family History  Problem Relation Age of Onset   Anuerysm Mother    Stroke Mother    Cancer Father        thyroid    Hypertension Father    Cancer Paternal Grandmother        breast   Seizures Maternal Grandmother    Past Surgical History:  Procedure Laterality Date   BREAST BIOPSY  2007   btl  [place] N/A 05/06/2016   Procedure: DILATATION & CURETTAGE/HYSTEROSCOPY WITH NOVASURE ABLATION;  Surgeon: Jonnie Kind, MD;  Location: [facility];  Service: Gynecology;  Laterality: N/A;  length-5; width-2.6; power-72 ; time- 1 minute 42 seconds   LAPAROSCOPIC BILATERAL SALPINGECTOMY Bilateral 05/06/2016   Procedure: LAPAROSCOPIC BILATERAL SALPINGECTOMY;  Surgeon: Jonnie Kind, MD;  Location: [facility];  Service: Gynecology;  Laterality: Bilateral;   RETAINED PLACENTA REMOVAL     TUBAL LIGATION     04/2016   tubal ligation reversal  2001   WISDOM TOOTH EXTRACTION     Social History   Social History Narrative   Not on file   Immunization History  Administered Date(s) Administered   Influenza,inj,Quad PF,6+ Mos 08/31/2014   PFIZER(Purple Top)SARS-COV-2 Vaccination 02/18/2020   Td  12/18/2010   Tdap 07/07/2002     Objective: Vital Signs: There were no vitals taken for this visit.   Physical Exam   Musculoskeletal Exam: ***  CDAI Exam: CDAI Score: -- Patient Global: --; Provider Global: -- Swollen: --; Tender: -- Joint Exam 09/10/2022   No joint exam has been documented for this visit   There is currently no information documented on the homunculus. Go to the Rheumatology activity and  complete the homunculus joint exam.  Investigation: No additional findings.  Imaging: No results found.  Recent Labs: Lab Results  Component Value Date   WBC 9.3 07/25/2022   HGB 12.6 07/25/2022   PLT 338 07/25/2022   NA 139 06/18/2022   K 3.1 (L) 06/18/2022   CL 99 06/18/2022   CO2 28 06/18/2022   GLUCOSE 73 06/18/2022   BUN 12 06/18/2022   CREATININE 0.93 06/18/2022   BILITOT 0.6 06/18/2022   ALKPHOS 112 09/10/2021   AST 14 06/18/2022   ALT 15 06/18/2022   PROT 7.9 06/18/2022   ALBUMIN 4.3 09/10/2021   CALCIUM 9.7 06/18/2022   GFRAA 86 12/27/2019    Speciality Comments: PLQ Eye exam 07/23/2022 Dr. Leticia Clas Optometrist normal f/u 12 month  Procedures:  No procedures performed Allergies: Biaxin [clarithromycin], Iodinated contrast media, Depo-medrol [methylprednisolone], Shellfish allergy, Yellow jacket venom [bee venom], and Peanut-containing drug products   Assessment / Plan:     Visit Diagnoses: Rheumatoid arthritis with rheumatoid factor of multiple sites without organ or systems involvement (HCC)  High risk medication use  Primary osteoarthritis of both hands  Primary osteoarthritis of both feet  Chondromalacia patellae, left knee  Chondromalacia patellae, right knee  Myofascial pain  Cervical radiculopathy  Vitamin D deficiency  Prediabetes  Essential hypertension  Adjustment reaction with anxiety and depression  Orders: No orders of the defined types were placed in this encounter.  No orders of the defined types were placed in this encounter.   Face-to-face time spent with patient was *** minutes. Greater than 50% of time was spent in counseling and coordination of care.  Follow-Up Instructions: No follow-ups on file.   Ofilia Neas, PA-C  Note - This record has been created using Dragon software.  Chart creation errors have been sought, but may not always  have been located. Such creation errors do not reflect on  the  standard of medical care.

## 2022-08-29 ENCOUNTER — Encounter: Payer: Self-pay | Admitting: Family Medicine

## 2022-08-29 ENCOUNTER — Ambulatory Visit: Payer: BC Managed Care – PPO | Admitting: Family Medicine

## 2022-08-29 VITALS — BP 130/78 | HR 62 | Resp 16 | Ht 66.0 in | Wt 175.0 lb

## 2022-08-29 DIAGNOSIS — R768 Other specified abnormal immunological findings in serum: Secondary | ICD-10-CM | POA: Diagnosis not present

## 2022-08-29 DIAGNOSIS — I10 Essential (primary) hypertension: Secondary | ICD-10-CM

## 2022-08-29 DIAGNOSIS — M75101 Unspecified rotator cuff tear or rupture of right shoulder, not specified as traumatic: Secondary | ICD-10-CM | POA: Diagnosis not present

## 2022-08-29 DIAGNOSIS — R718 Other abnormality of red blood cells: Secondary | ICD-10-CM

## 2022-08-29 DIAGNOSIS — E876 Hypokalemia: Secondary | ICD-10-CM

## 2022-08-29 NOTE — Patient Instructions (Addendum)
  Follow-up in 4-1/2 to 5 months, call if you need me sooner.  You do need your tetanus and shingles vaccines as well as a current COVID-vaccine and the flu vaccine.  It is very important that you contact the rheumatologist to make them aware that you are not taking the Praxilene because of the side effect of chest pain so that an alternative treatment plan can be discussed or you can see if a retrial of the paracranial is an option since you do notice a difference with being now that you stopped it.  Your decision to go with therapy for the torn rotator cuff before surgery based on concerns as to whether or not it will 100% cure the problem as well as the time for rehabilitation needs to be shared with the orthopedic surgeon so that you can start an active treatment plan as soon as possible.  Continue to focus on healthy eating habits, keep as active as you are able to, ensure that you get sufficient rest at least 6 hours per night and continue to work on mental health with positive attitude despite the challenges that you are going through at this time.  Please do get your colonoscopy as soon as possible this is overdue.  Thanks for choosing [facility], we consider it a privelige to serve you.

## 2022-08-31 ENCOUNTER — Encounter: Payer: Self-pay | Admitting: Family Medicine

## 2022-08-31 DIAGNOSIS — M75100 Unspecified rotator cuff tear or rupture of unspecified shoulder, not specified as traumatic: Secondary | ICD-10-CM | POA: Insufficient documentation

## 2022-08-31 NOTE — Progress Notes (Signed)
   [name]     MRN: [id number]      DOB: 02-27-1971   HPI [name] is here for follow up and re-evaluation of chronic medical conditions, medication management and review of any available recent lab and radiology data.  Preventive health is updated, states she will schedule her colonoscopy , now that she is out of work for an extended period  Questions or concerns regarding consultations or procedures which the PT has had in the interim are  addressed.Feels as though plaquinil helped her pain but was unable to tolerate I as she developed chest pain Recent MRI right shoulder shows tear she is opting for therapy over surgery Will be out of work for an additional 3 months, and this is directed by Ortho, mentally coping better with her current health challenges  ROS Denies recent fever or chills. Denies sinus pressure, nasal congestion, ear pain or sore throat. Denies chest congestion, productive cough or wheezing. Denies chest pains, palpitations and leg swelling Denies abdominal pain, nausea, vomiting,diarrhea or constipation.   Denies dysuria, frequency, hesitancy or incontinence.  Denies headaches, seizures, numbness, or tingling. Denies uncontrolled depression, anxiety or insomnia. Denies skin break down or rash.   PE  BP 130/78   Pulse 62   Resp 16   Ht 5' 6"$  (1.676 m)   Wt 175 lb (79.4 kg)   SpO2 97%   BMI 28.25 kg/m   Patient alert and oriented and in no cardiopulmonary distress.  HEENT: No facial asymmetry, EOMI,     Neck supple .  Chest: Clear to auscultation bilaterally.  CVS: S1, S2 no murmurs, no S3.Regular rate.  ABD: Soft non tender.   Ext: No edema  MS: Adequate ROM spine,  hips and knees.Markedly decreased ROM in righ shoulder which is swollen, and tender  Skin: Intact, no ulcerations or rash noted.  Psych: Good eye contact, normal affect. Memory intact not anxious or depressed appearing.  CNS: CN 2-12 intact, power,  normal throughout.no focal  deficits noted.   Assessment & Plan  Essential hypertension Controlled, no change in medication DASH diet and commitment to daily physical activity for a minimum of 30 minutes discussed and encouraged, as a part of hypertension management. The importance of attaining a healthy weight is also discussed.     08/29/2022    3:30 PM 08/25/2022    2:38 PM 07/30/2022    8:49 AM 07/25/2022    9:36 AM 07/16/2022    8:16 AM 06/18/2022    8:46 AM 06/12/2022   10:09 AM  BP/Weight  Systolic BP [id number] AB-123456789 [id number] AB-123456789 [id number]  Diastolic BP 78 80 84 90 74 82 86  Wt. (Lbs) 175 174 172.4 171.8 175.12 173.6   BMI 28.25 kg/m2 28.08 kg/m2 27.83 kg/m2 27.73 kg/m2 28.27 kg/m2 28.02 kg/m2        Elevated rheumatoid factor Needs to contact rheumatology, not taking med prescribed , reports adverse s/e of chst pain, need to start alternative or review current  Torn rotator cuff Being managed by Ortho, preventing pt from working, her preference is conservative versus surgical management at this time, need stomake Ortho aware and start active treatment Work excuse per Ortho  RBC microcytosis Hematology eval completed, no significant pathology  HYPOKALEMIA Recurrent, updated lab needed

## 2022-08-31 NOTE — Assessment & Plan Note (Signed)
Controlled, no change in medication DASH diet and commitment to daily physical activity for a minimum of 30 minutes discussed and encouraged, as a part of hypertension management. The importance of attaining a healthy weight is also discussed.     08/29/2022    3:30 PM 08/25/2022    2:38 PM 07/30/2022    8:49 AM 07/25/2022    9:36 AM 07/16/2022    8:16 AM 06/18/2022    8:46 AM 06/12/2022   10:09 AM  BP/Weight  Systolic BP [id number] [id number] AB-123456789 [id number] AB-123456789 [id number]  Diastolic BP 78 80 84 90 74 82 86  Wt. (Lbs) 175 174 172.4 171.8 175.12 173.6   BMI 28.25 kg/m2 28.08 kg/m2 27.83 kg/m2 27.73 kg/m2 28.27 kg/m2 28.02 kg/m2

## 2022-08-31 NOTE — Assessment & Plan Note (Signed)
Being managed by Ortho, preventing pt from working, her preference is conservative versus surgical management at this time, need stomake Ortho aware and start active treatment Work excuse per Ortho

## 2022-08-31 NOTE — Assessment & Plan Note (Signed)
Hematology eval completed, no significant pathology

## 2022-08-31 NOTE — Assessment & Plan Note (Signed)
Needs to contact rheumatology, not taking med prescribed , reports adverse s/e of chst pain, need to start alternative or review current

## 2022-08-31 NOTE — Assessment & Plan Note (Signed)
Recurrent, updated lab needed

## 2022-09-01 ENCOUNTER — Telehealth: Payer: Self-pay | Admitting: Rheumatology

## 2022-09-01 NOTE — Telephone Encounter (Signed)
I have not received any messages from Tabiona about discussing medication changes.  I was unaware that she had to stop plaquenil.  Please clarify if she reached out by phone or mychart because I do not see any record of her reaching out to me directly.

## 2022-09-01 NOTE — Telephone Encounter (Signed)
Patient called the office stating she was put PLQ on by Dr. Estanislado Pandy she had to stop taking 3 weeks ago because it caused chest pains. Patient states she has sent multiple messages to Lovena Le to discuss other options but no one has called her back. Patient states this has been going on for a month.

## 2022-09-01 NOTE — Telephone Encounter (Signed)
LMOM returning patient's call.

## 2022-09-01 NOTE — Telephone Encounter (Signed)
Patient returned call, patient advised she needs to make appt for medication management, patient requesting an afternoon appt, front desk will r/s appt if possible.

## 2022-09-02 NOTE — Progress Notes (Deleted)
Office Visit Note  Patient: [name]             Date of Birth: Apr 28, 1971           MRN: [id number]             PCP: Fayrene Helper, MD Referring: Fayrene Helper, MD Visit Date: 09/03/2022 Occupation: '@GUAROCC'$ @  Subjective:    History of Present Illness: [name] is a 52 y.o. female with history of seropositive rheumatoid arthritis, DDD, and osteoarthritis.  Patient reports she discontinued plaquenil after 3 weeks due to experiencing chest pain.   Ultrasound was negative.  Do not recommend more aggressive immunosuppression at this time. ESR and CRP WNL on 06/12/22.   NCV with EMG?  Activities of Daily Living:  Patient reports morning stiffness for *** {minute/hour:19697}.   Patient {ACTIONS;DENIES/REPORTS:21021675::"Denies"} nocturnal pain.  Difficulty dressing/grooming: {ACTIONS;DENIES/REPORTS:21021675::"Denies"} Difficulty climbing stairs: {ACTIONS;DENIES/REPORTS:21021675::"Denies"} Difficulty getting out of chair: {ACTIONS;DENIES/REPORTS:21021675::"Denies"} Difficulty using hands for taps, buttons, cutlery, and/or writing: {ACTIONS;DENIES/REPORTS:21021675::"Denies"}  No Rheumatology ROS completed.   PMFS History:  Patient Active Problem List   Diagnosis Date Noted   Torn rotator cuff 08/31/2022   RBC microcytosis 07/25/2022   Leukocytosis 07/25/2022   Chronic right shoulder pain 07/21/2022   Generalized pain 05/05/2022   Elevated rheumatoid factor 05/05/2022   Adjustment reaction with anxiety and depression 04/03/2022   Screen for STD (sexually transmitted disease) 02/05/2022   Acute low back pain without sciatica 11/28/2021   Cervical radiculopathy 11/28/2021   Benign skin mole 03/07/2020   Callus of foot 03/07/2020   Chronic superficial venous thrombosis of lower extremity 06/05/2019   Allergic reaction 12/07/2018   Multiple allergies 11/28/2015   Prediabetes 08/01/2012   Overweight (BMI 25.0-29.9) 08/01/2012   Vitamin D deficiency 07/28/2012    Allergic rhinitis 10/16/2009   Neck pain 03/17/2009   HYPOKALEMIA 03/14/2008   THYROID NODULE 10/26/2007   Essential hypertension 10/26/2007   Back pain with radiation 10/26/2007    Past Medical History:  Diagnosis Date   Back pain    BV (bacterial vaginosis) 09/16/2013   Chest pain 2000   right    Contraceptive management 09/16/2013   Encephalocele ([place]) 1994   Enlarged thyroid    HSV-2 (herpes simplex virus 2) infection    Hypertension    Irregular intermenstrual bleeding 04/27/2015   Obesity    Preterm labor    Sleep disorder    Thyroid nodule    Urinary frequency 12/14/2014   Vaginal discharge 09/16/2013    Family History  Problem Relation Age of Onset   Anuerysm Mother    Stroke Mother    Cancer Father        thyroid    Hypertension Father    Cancer Paternal Grandmother        breast   Seizures Maternal Grandmother    Past Surgical History:  Procedure Laterality Date   BREAST BIOPSY  2007   btl  [place] N/A 05/06/2016   Procedure: DILATATION & CURETTAGE/HYSTEROSCOPY WITH NOVASURE ABLATION;  Surgeon: Jonnie Kind, MD;  Location: [facility];  Service: Gynecology;  Laterality: N/A;  length-5; width-2.6; power-72 ; time- 1 minute 42 seconds   LAPAROSCOPIC BILATERAL SALPINGECTOMY Bilateral 05/06/2016   Procedure: LAPAROSCOPIC BILATERAL SALPINGECTOMY;  Surgeon: Jonnie Kind, MD;  Location: [facility];  Service: Gynecology;  Laterality: Bilateral;   RETAINED PLACENTA REMOVAL     TUBAL LIGATION     04/2016   tubal ligation reversal  2001   WISDOM TOOTH EXTRACTION     Social History   Social History Narrative   Not on file   Immunization History  Administered Date(s) Administered   Influenza,inj,Quad PF,6+ Mos 08/31/2014   PFIZER(Purple Top)SARS-COV-2 Vaccination 02/18/2020   Td 12/18/2010   Tdap 07/07/2002     Objective: Vital Signs: There were no vitals taken for this visit.   Physical Exam Vitals and  nursing note reviewed.  Constitutional:      Appearance: She is well-developed.  HENT:     Head: Normocephalic and atraumatic.  Eyes:     Conjunctiva/sclera: Conjunctivae normal.  Cardiovascular:     Rate and Rhythm: Normal rate and regular rhythm.     Heart sounds: Normal heart sounds.  Pulmonary:     Effort: Pulmonary effort is normal.     Breath sounds: Normal breath sounds.  Abdominal:     General: Bowel sounds are normal.     Palpations: Abdomen is soft.  Musculoskeletal:     Cervical back: Normal range of motion.  Skin:    General: Skin is warm and dry.     Capillary Refill: Capillary refill takes less than 2 seconds.  Neurological:     Mental Status: She is alert and oriented to person, place, and time.  Psychiatric:        Behavior: Behavior normal.     Musculoskeletal Exam: ***  CDAI Exam: CDAI Score: -- Patient Global: --; Provider Global: -- Swollen: --; Tender: -- Joint Exam 09/03/2022   No joint exam has been documented for this visit   There is currently no information documented on the homunculus. Go to the Rheumatology activity and complete the homunculus joint exam.  Investigation: No additional findings.  Imaging: No results found.  Recent Labs: Lab Results  Component Value Date   WBC 9.3 07/25/2022   HGB 12.6 07/25/2022   PLT 338 07/25/2022   NA 139 06/18/2022   K 3.1 (L) 06/18/2022   CL 99 06/18/2022   CO2 28 06/18/2022   GLUCOSE 73 06/18/2022   BUN 12 06/18/2022   CREATININE 0.93 06/18/2022   BILITOT 0.6 06/18/2022   ALKPHOS 112 09/10/2021   AST 14 06/18/2022   ALT 15 06/18/2022   PROT 7.9 06/18/2022   ALBUMIN 4.3 09/10/2021   CALCIUM 9.7 06/18/2022   GFRAA 86 12/27/2019    Speciality Comments: PLQ Eye exam 07/23/2022 Dr. Leticia Clas Optometrist normal f/u 12 month  Procedures:  No procedures performed Allergies: Biaxin [clarithromycin], Iodinated contrast media, Depo-medrol [methylprednisolone], Shellfish allergy, Yellow  jacket venom [bee venom], and Peanut-containing drug products   Assessment / Plan:     Visit Diagnoses: Rheumatoid arthritis with rheumatoid factor of multiple sites without organ or systems involvement (HCC)  High risk medication use  Primary osteoarthritis of both hands  Primary osteoarthritis of both feet  Chondromalacia patellae, left knee  Chondromalacia patellae, right knee  Myofascial pain  Cervical radiculopathy  Vitamin D deficiency  Prediabetes  Essential hypertension  Adjustment reaction with anxiety and depression  Orders: No orders of the defined types were placed in this encounter.  No orders of the defined types were placed in this encounter.    Follow-Up Instructions: No follow-ups on file.   Ofilia Neas, PA-C  Note - This record has been created using Dragon software.  Chart creation errors have been sought, but may not always  have been located. Such creation errors do not reflect on  the standard of medical care.

## 2022-09-03 ENCOUNTER — Ambulatory Visit: Payer: BC Managed Care – PPO | Admitting: Physician Assistant

## 2022-09-03 DIAGNOSIS — R7303 Prediabetes: Secondary | ICD-10-CM

## 2022-09-03 DIAGNOSIS — M2241 Chondromalacia patellae, right knee: Secondary | ICD-10-CM

## 2022-09-03 DIAGNOSIS — M2242 Chondromalacia patellae, left knee: Secondary | ICD-10-CM

## 2022-09-03 DIAGNOSIS — I10 Essential (primary) hypertension: Secondary | ICD-10-CM

## 2022-09-03 DIAGNOSIS — M19042 Primary osteoarthritis, left hand: Secondary | ICD-10-CM

## 2022-09-03 DIAGNOSIS — M0579 Rheumatoid arthritis with rheumatoid factor of multiple sites without organ or systems involvement: Secondary | ICD-10-CM

## 2022-09-03 DIAGNOSIS — F4323 Adjustment disorder with mixed anxiety and depressed mood: Secondary | ICD-10-CM

## 2022-09-03 DIAGNOSIS — M5412 Radiculopathy, cervical region: Secondary | ICD-10-CM

## 2022-09-03 DIAGNOSIS — Z79899 Other long term (current) drug therapy: Secondary | ICD-10-CM

## 2022-09-03 DIAGNOSIS — E559 Vitamin D deficiency, unspecified: Secondary | ICD-10-CM

## 2022-09-03 DIAGNOSIS — M19071 Primary osteoarthritis, right ankle and foot: Secondary | ICD-10-CM

## 2022-09-03 DIAGNOSIS — M7918 Myalgia, other site: Secondary | ICD-10-CM

## 2022-09-04 ENCOUNTER — Encounter: Payer: Self-pay | Admitting: Radiology

## 2022-09-10 ENCOUNTER — Ambulatory Visit: Payer: BC Managed Care – PPO | Admitting: Physician Assistant

## 2022-09-10 ENCOUNTER — Ambulatory Visit (HOSPITAL_COMMUNITY): Payer: BC Managed Care – PPO | Attending: Neurosurgery | Admitting: Occupational Therapy

## 2022-09-10 DIAGNOSIS — M2242 Chondromalacia patellae, left knee: Secondary | ICD-10-CM

## 2022-09-10 DIAGNOSIS — M19071 Primary osteoarthritis, right ankle and foot: Secondary | ICD-10-CM

## 2022-09-10 DIAGNOSIS — M0579 Rheumatoid arthritis with rheumatoid factor of multiple sites without organ or systems involvement: Secondary | ICD-10-CM

## 2022-09-10 DIAGNOSIS — M25611 Stiffness of right shoulder, not elsewhere classified: Secondary | ICD-10-CM | POA: Diagnosis not present

## 2022-09-10 DIAGNOSIS — F4323 Adjustment disorder with mixed anxiety and depressed mood: Secondary | ICD-10-CM

## 2022-09-10 DIAGNOSIS — R7303 Prediabetes: Secondary | ICD-10-CM

## 2022-09-10 DIAGNOSIS — M19041 Primary osteoarthritis, right hand: Secondary | ICD-10-CM

## 2022-09-10 DIAGNOSIS — R29898 Other symptoms and signs involving the musculoskeletal system: Secondary | ICD-10-CM | POA: Insufficient documentation

## 2022-09-10 DIAGNOSIS — E559 Vitamin D deficiency, unspecified: Secondary | ICD-10-CM

## 2022-09-10 DIAGNOSIS — I10 Essential (primary) hypertension: Secondary | ICD-10-CM

## 2022-09-10 DIAGNOSIS — M25511 Pain in right shoulder: Secondary | ICD-10-CM | POA: Insufficient documentation

## 2022-09-10 DIAGNOSIS — M2241 Chondromalacia patellae, right knee: Secondary | ICD-10-CM

## 2022-09-10 DIAGNOSIS — M5412 Radiculopathy, cervical region: Secondary | ICD-10-CM

## 2022-09-10 DIAGNOSIS — Z79899 Other long term (current) drug therapy: Secondary | ICD-10-CM

## 2022-09-10 DIAGNOSIS — M7918 Myalgia, other site: Secondary | ICD-10-CM

## 2022-09-10 NOTE — Therapy (Signed)
OUTPATIENT OCCUPATIONAL THERAPY ORTHO EVALUATION  Patient Name: [name] MRN: [id number] DOB:August 19, 1970, 52 y.o., female Today's Date: 09/10/2022  PCP: Tula Nakayama, MD REFERRING PROVIDER: Marchia Bond, MD  END OF SESSION: END OF SESSION:  OT End of Session - 09/12/22 0736     Visit Number 1    Number of Visits 9    Date for OT Re-Evaluation 10/17/22    Authorization Type [employer]    OT Start Time 1430    OT Stop Time 1510    OT Time Calculation (min) 40 min    Activity Tolerance Patient tolerated treatment well    Behavior During Therapy [facility] for tasks assessed/performed              Past Medical History:  Diagnosis Date   Back pain    BV (bacterial vaginosis) 09/16/2013   Chest pain 2000   right    Contraceptive management 09/16/2013   Encephalocele ([place]) 1994   Enlarged thyroid    HSV-2 (herpes simplex virus 2) infection    Hypertension    Irregular intermenstrual bleeding 04/27/2015   Obesity    Preterm labor    Sleep disorder    Thyroid nodule    Urinary frequency 12/14/2014   Vaginal discharge 09/16/2013   Past Surgical History:  Procedure Laterality Date   BREAST BIOPSY  2007   btl  [place] N/A 05/06/2016   Procedure: DILATATION & CURETTAGE/HYSTEROSCOPY WITH NOVASURE ABLATION;  Surgeon: Jonnie Kind, MD;  Location: [facility];  Service: Gynecology;  Laterality: N/A;  length-5; width-2.6; power-72 ; time- 1 minute 42 seconds   LAPAROSCOPIC BILATERAL SALPINGECTOMY Bilateral 05/06/2016   Procedure: LAPAROSCOPIC BILATERAL SALPINGECTOMY;  Surgeon: Jonnie Kind, MD;  Location: [facility];  Service: Gynecology;  Laterality: Bilateral;   RETAINED PLACENTA REMOVAL     TUBAL LIGATION     04/2016   tubal ligation reversal  2001   WISDOM TOOTH EXTRACTION     Patient Active Problem List   Diagnosis Date Noted   Torn rotator cuff 08/31/2022   RBC microcytosis 07/25/2022   Leukocytosis 07/25/2022    Chronic right shoulder pain 07/21/2022   Generalized pain 05/05/2022   Elevated rheumatoid factor 05/05/2022   Adjustment reaction with anxiety and depression 04/03/2022   Screen for STD (sexually transmitted disease) 02/05/2022   Acute low back pain without sciatica 11/28/2021   Cervical radiculopathy 11/28/2021   Benign skin mole 03/07/2020   Callus of foot 03/07/2020   Chronic superficial venous thrombosis of lower extremity 06/05/2019   Allergic reaction 12/07/2018   Multiple allergies 11/28/2015   Prediabetes 08/01/2012   Overweight (BMI 25.0-29.9) 08/01/2012   Vitamin D deficiency 07/28/2012   Allergic rhinitis 10/16/2009   Neck pain 03/17/2009   HYPOKALEMIA 03/14/2008   THYROID NODULE 10/26/2007   Essential hypertension 10/26/2007   Back pain with radiation 10/26/2007    ONSET DATE: November 2023  REFERRING DIAG: R Rotator Cuff Tear  THERAPY DIAG:  No diagnosis found.  Rationale for Evaluation and Treatment: Rehabilitation  SUBJECTIVE:   SUBJECTIVE STATEMENT: "This has been bothering me since surgery last year." Pt accompanied by: self  PERTINENT HISTORY: Pt was receiving therapy for her neck back in November 2023, she noticed her hand and ROM in her shoulder quickly worsened. Followed up with MD who completed an MRI and diagnosed her with a rotator cuff tear.   PRECAUTIONS: None  WEIGHT BEARING RESTRICTIONS: No  PAIN:  Are you having pain?  Yes: NPRS scale: 8/10 Pain location: Anterior shoulder girdle Pain description: Sore and achy Aggravating factors: movement Relieving factors: heat  FALLS: Has patient fallen in last 6 months? No  LIVING ENVIRONMENT: Lives with: lives with their family Lives in: House/apartment  PLOF: Independent  PATIENT GOALS: To get some ROM back and reduce pain - To avoid surgery  NEXT MD VISIT: Unsure  OBJECTIVE:   HAND DOMINANCE: Right  ADLs: Overall ADLs: Pt has increased pain with ADL's, which increases effort and  time to complete. Unable to cook or cleaning due to poor grip and weakness.   FUNCTIONAL OUTCOME MEASURES: FOTO: 40.70  UPPER EXTREMITY ROM:     Active ROM Right eval  Shoulder flexion 56  Shoulder abduction 54  Shoulder internal rotation 90  Shoulder external rotation 55  Elbow flexion 105  Elbow extension -23  (Blank rows = not tested)  UPPER EXTREMITY MMT:     MMT Right eval  Shoulder flexion   Shoulder abduction   Shoulder adduction   Shoulder extension   Shoulder internal rotation   Shoulder external rotation   Middle trapezius   Lower trapezius   Elbow flexion   Elbow extension   Wrist flexion   Wrist extension   Wrist ulnar deviation   Wrist radial deviation   Wrist pronation   Wrist supination   (Blank rows = not tested)  HAND FUNCTION: Grip strength: Right: 10 lbs; Left: 40 lbs  SENSATION: At times pt reports that her hand goes numb and tingly  EDEMA: No swelling noted at this time.  OBSERVATIONS: Moderate to severe fascial restrictions along the bicep, anterior shoulder girdle, and scapularis  TODAY'S TREATMENT:                                                                                                                              DATE: 09/10/22: Evaluation Only    PATIENT EDUCATION: Education details: Pendulums and Table Slides Person educated: Patient Education method: Explanation, Demonstration, and Handouts Education comprehension: verbalized understanding and returned demonstration  HOME EXERCISE PROGRAM: 3/6: Pendulums and Table Slides   GOALS: Goals reviewed with patient? Yes  SHORT TERM GOALS: Target date: 10/17/22  Pt will be provided with and educated on HEP to improve RUE functional use during ADLs.  Goal status: INITIAL  2.   Pt will decrease pain in RUE to 4/10 or less to sleep for 3+ consecutive hours without waking due to pain.  Goal status: INITIAL  3.  Pt will decrease RUE fascial restrictions to min amounts or  less to improve ability to perform functional reaching tasks with minimal increase in pain.   Goal status: INITIAL  4.  Pt will improve RUE A/ROM by 30 degrees in order to reach overhead and behind back during dressing and bathing tasks.   Goal status: INITIAL  5.  Pt will improve RUE strength to 4+/5 in order to complete lifting tasks required during cooking and cleaning  tasks.   Goal status: INITIAL   ASSESSMENT:  CLINICAL IMPRESSION: Patient is a 52 y.o. female who was seen today for occupational therapy evaluation for R shoulder pain and limited ROM. Pt reports that she has a tear and wants to see if therapy will assist with decreasing pain and increasing strength to decrease need for surgery at the present.   PERFORMANCE DEFICITS: in functional skills including ADLs, IADLs, ROM, strength, pain, fascial restrictions, Gross motor control, body mechanics, and UE functional use.  IMPAIRMENTS: are limiting patient from ADLs, IADLs, rest and sleep, work, leisure, and social participation.   COMORBIDITIES: has no other co-morbidities that affects occupational performance. Patient will benefit from skilled OT to address above impairments and improve overall function.  MODIFICATION OR ASSISTANCE TO COMPLETE EVALUATION: No modification of tasks or assist necessary to complete an evaluation.  OT OCCUPATIONAL PROFILE AND HISTORY: Problem focused assessment: Including review of records relating to presenting problem.  CLINICAL DECISION MAKING: LOW - limited treatment options, no task modification necessary  REHAB POTENTIAL: Good  EVALUATION COMPLEXITY: Low      PLAN:  OT FREQUENCY: 2x/week  OT DURATION: 4 weeks  PLANNED INTERVENTIONS: self care/ADL training, therapeutic exercise, therapeutic activity, manual therapy, passive range of motion, functional mobility training, electrical stimulation, ultrasound, moist heat, cryotherapy, patient/family education, coping strategies  training, and DME and/or AE instructions  RECOMMENDED OTHER SERVICES: N/A  CONSULTED AND AGREED WITH PLAN OF CARE: Patient  PLAN FOR NEXT SESSION: Manual Therapy, P/ROM, Tables Slides, [place], OTR/L [facility] [place], [place] 09/10/2022, 2:47 PM

## 2022-09-10 NOTE — Patient Instructions (Signed)

## 2022-09-12 ENCOUNTER — Ambulatory Visit (HOSPITAL_COMMUNITY): Payer: BC Managed Care – PPO | Admitting: Occupational Therapy

## 2022-09-12 DIAGNOSIS — M25611 Stiffness of right shoulder, not elsewhere classified: Secondary | ICD-10-CM | POA: Diagnosis not present

## 2022-09-12 DIAGNOSIS — R29898 Other symptoms and signs involving the musculoskeletal system: Secondary | ICD-10-CM

## 2022-09-12 DIAGNOSIS — M25511 Pain in right shoulder: Secondary | ICD-10-CM | POA: Diagnosis not present

## 2022-09-15 NOTE — Therapy (Signed)
OUTPATIENT OCCUPATIONAL THERAPY ORTHO TREATMENT NOTE  Patient Name: [name] MRN: [id number] DOB:15-Sep-1970, 52 y.o., female Today's Date: 09/15/2022  PCP: Tula Nakayama, MD REFERRING PROVIDER: Marchia Bond, MD  END OF SESSION: END OF SESSION:   09/12/22 2041  OT Visits / Re-Eval  Visit Number 2  Number of Visits 9  Date for OT Re-Evaluation 10/17/22  Authorization  Authorization Type BCBS  OT Time Calculation  OT Start Time 0820  OT Stop Time 0900  OT Time Calculation (min) 40 min  End of Session  Activity Tolerance Patient tolerated treatment well  Behavior During Therapy [facility] for tasks assessed/performed    Past Medical History:  Diagnosis Date   Back pain    BV (bacterial vaginosis) 09/16/2013   Chest pain 2000   right    Contraceptive management 09/16/2013   Encephalocele ([place]) 1994   Enlarged thyroid    HSV-2 (herpes simplex virus 2) infection    Hypertension    Irregular intermenstrual bleeding 04/27/2015   Obesity    Preterm labor    Sleep disorder    Thyroid nodule    Urinary frequency 12/14/2014   Vaginal discharge 09/16/2013   Past Surgical History:  Procedure Laterality Date   BREAST BIOPSY  2007   btl  [place] N/A 05/06/2016   Procedure: DILATATION & CURETTAGE/HYSTEROSCOPY WITH NOVASURE ABLATION;  Surgeon: Jonnie Kind, MD;  Location: [facility];  Service: Gynecology;  Laterality: N/A;  length-5; width-2.6; power-72 ; time- 1 minute 42 seconds   LAPAROSCOPIC BILATERAL SALPINGECTOMY Bilateral 05/06/2016   Procedure: LAPAROSCOPIC BILATERAL SALPINGECTOMY;  Surgeon: Jonnie Kind, MD;  Location: [facility];  Service: Gynecology;  Laterality: Bilateral;   RETAINED PLACENTA REMOVAL     TUBAL LIGATION     04/2016   tubal ligation reversal  2001   WISDOM TOOTH EXTRACTION     Patient Active Problem List   Diagnosis Date Noted   Torn rotator cuff 08/31/2022   RBC microcytosis 07/25/2022    Leukocytosis 07/25/2022   Chronic right shoulder pain 07/21/2022   Generalized pain 05/05/2022   Elevated rheumatoid factor 05/05/2022   Adjustment reaction with anxiety and depression 04/03/2022   Screen for STD (sexually transmitted disease) 02/05/2022   Acute low back pain without sciatica 11/28/2021   Cervical radiculopathy 11/28/2021   Benign skin mole 03/07/2020   Callus of foot 03/07/2020   Chronic superficial venous thrombosis of lower extremity 06/05/2019   Allergic reaction 12/07/2018   Multiple allergies 11/28/2015   Prediabetes 08/01/2012   Overweight (BMI 25.0-29.9) 08/01/2012   Vitamin D deficiency 07/28/2012   Allergic rhinitis 10/16/2009   Neck pain 03/17/2009   HYPOKALEMIA 03/14/2008   THYROID NODULE 10/26/2007   Essential hypertension 10/26/2007   Back pain with radiation 10/26/2007    ONSET DATE: November 2023  REFERRING DIAG: R Rotator Cuff Tear  THERAPY DIAG:  Acute pain of right shoulder  Shoulder stiffness, right  Other symptoms and signs involving the musculoskeletal system  Rationale for Evaluation and Treatment: Rehabilitation  SUBJECTIVE:   SUBJECTIVE STATEMENT: "It is just hurting so badly." Pt accompanied by: self  PERTINENT HISTORY: Pt was receiving therapy for her neck back in November 2023, she noticed her hand and ROM in her shoulder quickly worsened. Followed up with MD who completed an MRI and diagnosed her with a rotator cuff tear.   PRECAUTIONS: None  WEIGHT BEARING RESTRICTIONS: No  PAIN:  Are you having pain? Yes: NPRS scale: 8/10 Pain  location: Anterior shoulder girdle Pain description: Sore and achy Aggravating factors: movement Relieving factors: heat  FALLS: Has patient fallen in last 6 months? No  LIVING ENVIRONMENT: Lives with: lives with their family Lives in: House/apartment  PLOF: Independent  PATIENT GOALS: To get some ROM back and reduce pain - To avoid surgery  NEXT MD VISIT: Unsure  OBJECTIVE:    HAND DOMINANCE: Right  ADLs: Overall ADLs: Pt has increased pain with ADL's, which increases effort and time to complete. Unable to cook or cleaning due to poor grip and weakness.   FUNCTIONAL OUTCOME MEASURES: FOTO: 40.70  UPPER EXTREMITY ROM:     Active ROM Right eval  Shoulder flexion 56  Shoulder abduction 54  Shoulder internal rotation 90  Shoulder external rotation 55  Elbow flexion 105  Elbow extension -23  (Blank rows = not tested)  UPPER EXTREMITY MMT:     MMT Right eval  Shoulder flexion   Shoulder abduction   Shoulder adduction   Shoulder extension   Shoulder internal rotation   Shoulder external rotation   Middle trapezius   Lower trapezius   Elbow flexion   Elbow extension   Wrist flexion   Wrist extension   Wrist ulnar deviation   Wrist radial deviation   Wrist pronation   Wrist supination   (Blank rows = not tested)  HAND FUNCTION: Grip strength: Right: 10 lbs; Left: 40 lbs  SENSATION: At times pt reports that her hand goes numb and tingly  EDEMA: No swelling noted at this time.  OBSERVATIONS: Moderate to severe fascial restrictions along the bicep, anterior shoulder girdle, and scapularis  TODAY'S TREATMENT:                                                                                                                              DATE:  09/12/22 -Manual Therapy: myofascial release and trigger point applied to the biceps, axillary region, scapularis region, and trapezius, in order to reduce pain and fascial restrictions to improve ROM.  -P/ROM: supine, flexion, abduction, horizontal abduction, er/IR, x10 -Ball Rolls: flexion and abduction, x10 -Thumb tacs x60"   PATIENT EDUCATION: Education details: Pendulums and Table Slides Person educated: Patient Education method: Explanation, Demonstration, and Handouts Education comprehension: verbalized understanding and returned demonstration  HOME EXERCISE PROGRAM: 3/6: Pendulums and Table  Slides   GOALS: Goals reviewed with patient? Yes  SHORT TERM GOALS: Target date: 10/17/22  Pt will be provided with and educated on HEP to improve RUE functional use during ADLs.  Goal status: IN PROGRESS  2.   Pt will decrease pain in RUE to 4/10 or less to sleep for 3+ consecutive hours without waking due to pain.  Goal status: IN PROGRESS  3.  Pt will decrease RUE fascial restrictions to min amounts or less to improve ability to perform functional reaching tasks with minimal increase in pain.   Goal status: IN PROGRESS  4.  Pt will improve RUE A/ROM  by 30 degrees in order to reach overhead and behind back during dressing and bathing tasks.   Goal status: IN PROGRESS  5.  Pt will improve RUE strength to 4+/5 in order to complete lifting tasks required during cooking and cleaning tasks.   Goal status: IN PROGRESS   ASSESSMENT:  CLINICAL IMPRESSION: Patient is a 52 y.o. female who was seen today for occupational therapy evaluation for R shoulder pain and limited ROM. Pt reports that she has a tear and wants to see if therapy will assist with decreasing pain and increasing strength to decrease need for surgery at the present.   PERFORMANCE DEFICITS: in functional skills including ADLs, IADLs, ROM, strength, pain, fascial restrictions, Gross motor control, body mechanics, and UE functional use.   PLAN:  OT FREQUENCY: 2x/week  OT DURATION: 4 weeks  PLANNED INTERVENTIONS: self care/ADL training, therapeutic exercise, therapeutic activity, manual therapy, passive range of motion, functional mobility training, electrical stimulation, ultrasound, moist heat, cryotherapy, patient/family education, coping strategies training, and DME and/or AE instructions  RECOMMENDED OTHER SERVICES: N/A  CONSULTED AND AGREED WITH PLAN OF CARE: Patient  PLAN FOR NEXT SESSION: Manual Therapy, P/ROM, Tables Slides, [place], OTR/L [facility] [place], [place] 09/15/2022, 8:44 PM

## 2022-09-23 NOTE — Progress Notes (Deleted)
Office Visit Note  Patient: [name]             Date of Birth: 1971-02-12           MRN: [id number]             PCP: Fayrene Helper, MD Referring: Fayrene Helper, MD Visit Date: 10/07/2022 Occupation: @GUAROCC @  Subjective:    History of Present Illness: [name] is a 52 y.o. female with history of seropositive rheumatoid arthritis and osteoarthritis.  Patient is taking plaquenil 200 mg 1 tablet by mouth daily.   PLQ Eye exam 07/23/2022 Dr. Leticia Clas Optometrist normal f/u 12 month  CBC and CMP updated on 06/18/22. Orders for CBC and CMP released today.   Activities of Daily Living:  Patient reports morning stiffness for *** {minute/hour:19697}.   Patient {ACTIONS;DENIES/REPORTS:21021675::"Denies"} nocturnal pain.  Difficulty dressing/grooming: {ACTIONS;DENIES/REPORTS:21021675::"Denies"} Difficulty climbing stairs: {ACTIONS;DENIES/REPORTS:21021675::"Denies"} Difficulty getting out of chair: {ACTIONS;DENIES/REPORTS:21021675::"Denies"} Difficulty using hands for taps, buttons, cutlery, and/or writing: {ACTIONS;DENIES/REPORTS:21021675::"Denies"}  No Rheumatology ROS completed.   PMFS History:  Patient Active Problem List   Diagnosis Date Noted   Torn rotator cuff 08/31/2022   RBC microcytosis 07/25/2022   Leukocytosis 07/25/2022   Chronic right shoulder pain 07/21/2022   Generalized pain 05/05/2022   Elevated rheumatoid factor 05/05/2022   Adjustment reaction with anxiety and depression 04/03/2022   Screen for STD (sexually transmitted disease) 02/05/2022   Acute low back pain without sciatica 11/28/2021   Cervical radiculopathy 11/28/2021   Benign skin mole 03/07/2020   Callus of foot 03/07/2020   Chronic superficial venous thrombosis of lower extremity 06/05/2019   Allergic reaction 12/07/2018   Multiple allergies 11/28/2015   Prediabetes 08/01/2012   Overweight (BMI 25.0-29.9) 08/01/2012   Vitamin D deficiency 07/28/2012   Allergic rhinitis  10/16/2009   Neck pain 03/17/2009   HYPOKALEMIA 03/14/2008   THYROID NODULE 10/26/2007   Essential hypertension 10/26/2007   Back pain with radiation 10/26/2007    Past Medical History:  Diagnosis Date   Back pain    BV (bacterial vaginosis) 09/16/2013   Chest pain 2000   right    Contraceptive management 09/16/2013   Encephalocele ([place]) 1994   Enlarged thyroid    HSV-2 (herpes simplex virus 2) infection    Hypertension    Irregular intermenstrual bleeding 04/27/2015   Obesity    Preterm labor    Sleep disorder    Thyroid nodule    Urinary frequency 12/14/2014   Vaginal discharge 09/16/2013    Family History  Problem Relation Age of Onset   Anuerysm Mother    Stroke Mother    Cancer Father        thyroid    Hypertension Father    Cancer Paternal Grandmother        breast   Seizures Maternal Grandmother    Past Surgical History:  Procedure Laterality Date   BREAST BIOPSY  2007   btl  [place] N/A 05/06/2016   Procedure: DILATATION & CURETTAGE/HYSTEROSCOPY WITH NOVASURE ABLATION;  Surgeon: Jonnie Kind, MD;  Location: [facility];  Service: Gynecology;  Laterality: N/A;  length-5; width-2.6; power-72 ; time- 1 minute 42 seconds   LAPAROSCOPIC BILATERAL SALPINGECTOMY Bilateral 05/06/2016   Procedure: LAPAROSCOPIC BILATERAL SALPINGECTOMY;  Surgeon: Jonnie Kind, MD;  Location: [facility];  Service: Gynecology;  Laterality: Bilateral;   RETAINED PLACENTA REMOVAL     TUBAL LIGATION     04/2016   tubal ligation reversal  2001   WISDOM TOOTH EXTRACTION     Social History   Social History Narrative   Not on file   Immunization History  Administered Date(s) Administered   Influenza,inj,Quad PF,6+ Mos 08/31/2014   PFIZER(Purple Top)SARS-COV-2 Vaccination 02/18/2020   Td 12/18/2010   Tdap 07/07/2002     Objective: Vital Signs: There were no vitals taken for this visit.   Physical Exam Vitals and nursing note  reviewed.  Constitutional:      Appearance: She is well-developed.  HENT:     Head: Normocephalic and atraumatic.  Eyes:     Conjunctiva/sclera: Conjunctivae normal.  Cardiovascular:     Rate and Rhythm: Normal rate and regular rhythm.     Heart sounds: Normal heart sounds.  Pulmonary:     Effort: Pulmonary effort is normal.     Breath sounds: Normal breath sounds.  Abdominal:     General: Bowel sounds are normal.     Palpations: Abdomen is soft.  Musculoskeletal:     Cervical back: Normal range of motion.  Lymphadenopathy:     Cervical: No cervical adenopathy.  Skin:    General: Skin is warm and dry.     Capillary Refill: Capillary refill takes less than 2 seconds.  Neurological:     Mental Status: She is alert and oriented to person, place, and time.  Psychiatric:        Behavior: Behavior normal.      Musculoskeletal Exam: ***  CDAI Exam: CDAI Score: -- Patient Global: --; Provider Global: -- Swollen: --; Tender: -- Joint Exam 10/07/2022   No joint exam has been documented for this visit   There is currently no information documented on the homunculus. Go to the Rheumatology activity and complete the homunculus joint exam.  Investigation: No additional findings.  Imaging: No results found.  Recent Labs: Lab Results  Component Value Date   WBC 9.3 07/25/2022   HGB 12.6 07/25/2022   PLT 338 07/25/2022   NA 139 06/18/2022   K 3.1 (L) 06/18/2022   CL 99 06/18/2022   CO2 28 06/18/2022   GLUCOSE 73 06/18/2022   BUN 12 06/18/2022   CREATININE 0.93 06/18/2022   BILITOT 0.6 06/18/2022   ALKPHOS 112 09/10/2021   AST 14 06/18/2022   ALT 15 06/18/2022   PROT 7.9 06/18/2022   ALBUMIN 4.3 09/10/2021   CALCIUM 9.7 06/18/2022   GFRAA 86 12/27/2019    Speciality Comments: PLQ Eye exam 07/23/2022 Dr. Leticia Clas Optometrist normal f/u 12 month  Procedures:  No procedures performed Allergies: Biaxin [clarithromycin], Iodinated contrast media, Depo-medrol  [methylprednisolone], Shellfish allergy, Yellow jacket venom [bee venom], and Peanut-containing drug products   Assessment / Plan:     Visit Diagnoses: Rheumatoid arthritis with rheumatoid factor of multiple sites without organ or systems involvement (HCC)  High risk medication use  Pain in both hands  Primary osteoarthritis of both hands  Primary osteoarthritis of both feet  Chondromalacia patellae, left knee  Chondromalacia patellae, right knee  Myofascial pain  Cervical radiculopathy  Vitamin D deficiency  Prediabetes  Essential hypertension  Adjustment reaction with anxiety and depression  Orders: No orders of the defined types were placed in this encounter.  No orders of the defined types were placed in this encounter.   Face-to-face time spent with patient was *** minutes. Greater than 50% of time was spent in counseling and coordination of care.  Follow-Up Instructions: No follow-ups on file.   Sandra Neas, PA-C  Note - This  record has been created using Dragon software.  Chart creation errors have been sought, but may not always  have been located. Such creation errors do not reflect on  the standard of medical care.  

## 2022-09-24 ENCOUNTER — Ambulatory Visit: Payer: BC Managed Care – PPO | Admitting: Family Medicine

## 2022-09-25 ENCOUNTER — Encounter (HOSPITAL_COMMUNITY): Payer: BC Managed Care – PPO | Admitting: Occupational Therapy

## 2022-09-25 ENCOUNTER — Telehealth (HOSPITAL_COMMUNITY): Payer: Self-pay | Admitting: Occupational Therapy

## 2022-09-25 NOTE — Telephone Encounter (Signed)
This OT called and spoke with pt reguarding missed appointment on 09/25/22 at 7:30am. Pt reports forgetting her appointment. OT provided a reminder of her next scheduled appointment. No further concerns at this time.   Paulita Fujita, OTR/L [facility] [phone]

## 2022-09-26 ENCOUNTER — Ambulatory Visit: Payer: BC Managed Care – PPO

## 2022-09-30 ENCOUNTER — Encounter (HOSPITAL_COMMUNITY): Payer: Self-pay | Admitting: Occupational Therapy

## 2022-09-30 ENCOUNTER — Telehealth: Payer: Self-pay | Admitting: Family Medicine

## 2022-09-30 ENCOUNTER — Ambulatory Visit (HOSPITAL_COMMUNITY): Payer: BC Managed Care – PPO | Admitting: Occupational Therapy

## 2022-09-30 DIAGNOSIS — R29898 Other symptoms and signs involving the musculoskeletal system: Secondary | ICD-10-CM | POA: Diagnosis not present

## 2022-09-30 DIAGNOSIS — M25511 Pain in right shoulder: Secondary | ICD-10-CM

## 2022-09-30 DIAGNOSIS — M25611 Stiffness of right shoulder, not elsewhere classified: Secondary | ICD-10-CM

## 2022-09-30 NOTE — Therapy (Signed)
OUTPATIENT OCCUPATIONAL THERAPY ORTHO TREATMENT NOTE  Patient Name: [name] MRN: [id number] DOB:09-Oct-1970, 52 y.o., female Today's Date: 09/30/2022  PCP: Tula Nakayama, MD REFERRING PROVIDER: Marchia Bond, MD  END OF SESSION:  OT End of Session - 09/30/22 1448     Visit Number 3    Number of Visits 9    Date for OT Re-Evaluation 10/17/22    Authorization Type BCBS    OT Start Time [id number]    OT Stop Time 1523    OT Time Calculation (min) 38 min    Activity Tolerance Patient tolerated treatment well    Behavior During Therapy [facility] for tasks assessed/performed           END OF SESSION:  OT End of Session - 09/30/22 1448     Visit Number 3    Number of Visits 9    Date for OT Re-Evaluation 10/17/22    Authorization Type BCBS    OT Start Time 1445    OT Stop Time 1523    OT Time Calculation (min) 38 min    Activity Tolerance Patient tolerated treatment well    Behavior During Therapy [facility] for tasks assessed/performed             09/12/22 2041  OT Visits / Re-Eval  Visit Number 2  Number of Visits 9  Date for OT Re-Evaluation 10/17/22  Authorization  Authorization Type BCBS  OT Time Calculation  OT Start Time 0820  OT Stop Time 0900  OT Time Calculation (min) 40 min  End of Session  Activity Tolerance Patient tolerated treatment well  Behavior During Therapy [facility] for tasks assessed/performed    Past Medical History:  Diagnosis Date   Back pain    BV (bacterial vaginosis) 09/16/2013   Chest pain 2000   right    Contraceptive management 09/16/2013   Encephalocele ([place]) 1994   Enlarged thyroid    HSV-2 (herpes simplex virus 2) infection    Hypertension    Irregular intermenstrual bleeding 04/27/2015   Obesity    Preterm labor    Sleep disorder    Thyroid nodule    Urinary frequency 12/14/2014   Vaginal discharge 09/16/2013   Past Surgical History:  Procedure Laterality Date   BREAST BIOPSY  2007   btl  [place] N/A 05/06/2016   Procedure: DILATATION & CURETTAGE/HYSTEROSCOPY WITH NOVASURE ABLATION;  Surgeon: Jonnie Kind, MD;  Location: [facility];  Service: Gynecology;  Laterality: N/A;  length-5; width-2.6; power-72 ; time- 1 minute 42 seconds   LAPAROSCOPIC BILATERAL SALPINGECTOMY Bilateral 05/06/2016   Procedure: LAPAROSCOPIC BILATERAL SALPINGECTOMY;  Surgeon: Jonnie Kind, MD;  Location: [facility];  Service: Gynecology;  Laterality: Bilateral;   RETAINED PLACENTA REMOVAL     TUBAL LIGATION     04/2016   tubal ligation reversal  2001   WISDOM TOOTH EXTRACTION     Patient Active Problem List   Diagnosis Date Noted   Torn rotator cuff 08/31/2022   RBC microcytosis 07/25/2022   Leukocytosis 07/25/2022   Chronic right shoulder pain 07/21/2022   Generalized pain 05/05/2022   Elevated rheumatoid factor 05/05/2022   Adjustment reaction with anxiety and depression 04/03/2022   Screen for STD (sexually transmitted disease) 02/05/2022   Acute low back pain without sciatica 11/28/2021   Cervical radiculopathy 11/28/2021   Benign skin mole 03/07/2020   Callus of foot 03/07/2020   Chronic superficial venous thrombosis of lower extremity 06/05/2019  Allergic reaction 12/07/2018   Multiple allergies 11/28/2015   Prediabetes 08/01/2012   Overweight (BMI 25.0-29.9) 08/01/2012   Vitamin D deficiency 07/28/2012   Allergic rhinitis 10/16/2009   Neck pain 03/17/2009   HYPOKALEMIA 03/14/2008   THYROID NODULE 10/26/2007   Essential hypertension 10/26/2007   Back pain with radiation 10/26/2007    ONSET DATE: November 2023  REFERRING DIAG: R Rotator Cuff Tear  THERAPY DIAG:  Acute pain of right shoulder  Shoulder stiffness, right  Other symptoms and signs involving the musculoskeletal system  Rationale for Evaluation and Treatment: Rehabilitation  SUBJECTIVE:   SUBJECTIVE STATEMENT: "I drove to [facility] and my arm was done" Pt accompanied by:  self  PERTINENT HISTORY: Pt was receiving therapy for her neck back in November 2023, she noticed her hand and ROM in her shoulder quickly worsened. Followed up with MD who completed an MRI and diagnosed her with a rotator cuff tear.   PRECAUTIONS: None  WEIGHT BEARING RESTRICTIONS: No  PAIN:  Are you having pain? Yes: NPRS scale: 6/10 Pain location: Anterior shoulder girdle Pain description: Sore and achy Aggravating factors: movement Relieving factors: heat  FALLS: Has patient fallen in last 6 months? No  PATIENT GOALS: To get some ROM back and reduce pain - To avoid surgery  NEXT MD VISIT: Unsure  OBJECTIVE:   HAND DOMINANCE: Right  ADLs: Overall ADLs: Pt has increased pain with ADL's, which increases effort and time to complete. Unable to cook or cleaning due to poor grip and weakness.   FUNCTIONAL OUTCOME MEASURES: FOTO: 40.70  UPPER EXTREMITY ROM:     Active ROM Right eval  Shoulder flexion 56  Shoulder abduction 54  Shoulder internal rotation 90  Shoulder external rotation 55  Elbow flexion 105  Elbow extension -23  (Blank rows = not tested)  UPPER EXTREMITY MMT:     MMT Right eval  Shoulder flexion   Shoulder abduction   Shoulder adduction   Shoulder extension   Shoulder internal rotation   Shoulder external rotation   Middle trapezius   Lower trapezius   Elbow flexion   Elbow extension   Wrist flexion   Wrist extension   Wrist ulnar deviation   Wrist radial deviation   Wrist pronation   Wrist supination   (Blank rows = not tested)  HAND FUNCTION: Grip strength: Right: 10 lbs; Left: 40 lbs  SENSATION: At times pt reports that her hand goes numb and tingly  EDEMA: No swelling noted at this time.  OBSERVATIONS: Moderate to severe fascial restrictions along the bicep, anterior shoulder girdle, and scapularis  TODAY'S TREATMENT:                                                                                                                               DATE:  09/30/22 -Manual Therapy: myofascial release and trigger point applied to the biceps, axillary region, scapularis region, and trapezius, in order to reduce pain and fascial restrictions  to improve ROM.  -AA/ROM: supine, flexion, protraction  09/12/22 -Manual Therapy: myofascial release and trigger point applied to the biceps, axillary region, scapularis region, and trapezius, in order to reduce pain and fascial restrictions to improve ROM.  -P/ROM: supine, flexion, abduction, horizontal abduction, er/IR, x10 -Ball Rolls: flexion and abduction, x10 -Thumb tacs x60"   PATIENT EDUCATION: Education details: Pendulums and Table Slides Person educated: Patient Education method: Explanation, Demonstration, and Handouts Education comprehension: verbalized understanding and returned demonstration  HOME EXERCISE PROGRAM: 3/6: Pendulums and Table Slides   GOALS: Goals reviewed with patient? Yes  SHORT TERM GOALS: Target date: 10/17/22  Pt will be provided with and educated on HEP to improve RUE functional use during ADLs.  Goal status: IN PROGRESS  2.   Pt will decrease pain in RUE to 4/10 or less to sleep for 3+ consecutive hours without waking due to pain.  Goal status: IN PROGRESS  3.  Pt will decrease RUE fascial restrictions to min amounts or less to improve ability to perform functional reaching tasks with minimal increase in pain.   Goal status: IN PROGRESS  4.  Pt will improve RUE A/ROM by 30 degrees in order to reach overhead and behind back during dressing and bathing tasks.   Goal status: IN PROGRESS  5.  Pt will improve RUE strength to 4+/5 in order to complete lifting tasks required during cooking and cleaning tasks.   Goal status: IN PROGRESS   ASSESSMENT:  CLINICAL IMPRESSION: Patient is a 52 y.o. female who was seen today for occupational therapy evaluation for R shoulder pain and limited ROM. Pt reports that she has a tear and wants to see if  therapy will assist with decreasing pain and increasing strength to decrease need for surgery at the present.   PERFORMANCE DEFICITS: in functional skills including ADLs, IADLs, ROM, strength, pain, fascial restrictions, Gross motor control, body mechanics, and UE functional use.   PLAN:  OT FREQUENCY: 2x/week  OT DURATION: 4 weeks  PLANNED INTERVENTIONS: self care/ADL training, therapeutic exercise, therapeutic activity, manual therapy, passive range of motion, functional mobility training, electrical stimulation, ultrasound, moist heat, cryotherapy, patient/family education, coping strategies training, and DME and/or AE instructions  RECOMMENDED OTHER SERVICES: N/A  CONSULTED AND AGREED WITH PLAN OF CARE: Patient  PLAN FOR NEXT SESSION: Manual Therapy, P/ROM, Tables Slides, [place], OTR/L [facility] [place], [place] 09/30/2022, 2:50 PM

## 2022-09-30 NOTE — Telephone Encounter (Signed)
UNUM   Copied Noted Sleeved  Copy in front folder / copy given to [employer].

## 2022-10-01 NOTE — Telephone Encounter (Signed)
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 Call patient and patient said they have the forms, our forms have been shredded

## 2022-10-07 ENCOUNTER — Ambulatory Visit: Payer: BC Managed Care – PPO | Admitting: Physician Assistant

## 2022-10-07 ENCOUNTER — Encounter: Payer: Self-pay | Admitting: Family Medicine

## 2022-10-07 DIAGNOSIS — M2241 Chondromalacia patellae, right knee: Secondary | ICD-10-CM

## 2022-10-07 DIAGNOSIS — E559 Vitamin D deficiency, unspecified: Secondary | ICD-10-CM

## 2022-10-07 DIAGNOSIS — M19071 Primary osteoarthritis, right ankle and foot: Secondary | ICD-10-CM

## 2022-10-07 DIAGNOSIS — M2242 Chondromalacia patellae, left knee: Secondary | ICD-10-CM

## 2022-10-07 DIAGNOSIS — M5412 Radiculopathy, cervical region: Secondary | ICD-10-CM

## 2022-10-07 DIAGNOSIS — R7303 Prediabetes: Secondary | ICD-10-CM

## 2022-10-07 DIAGNOSIS — I10 Essential (primary) hypertension: Secondary | ICD-10-CM

## 2022-10-07 DIAGNOSIS — M0579 Rheumatoid arthritis with rheumatoid factor of multiple sites without organ or systems involvement: Secondary | ICD-10-CM

## 2022-10-07 DIAGNOSIS — F4323 Adjustment disorder with mixed anxiety and depressed mood: Secondary | ICD-10-CM

## 2022-10-07 DIAGNOSIS — M19041 Primary osteoarthritis, right hand: Secondary | ICD-10-CM

## 2022-10-07 DIAGNOSIS — Z79899 Other long term (current) drug therapy: Secondary | ICD-10-CM

## 2022-10-07 DIAGNOSIS — M7918 Myalgia, other site: Secondary | ICD-10-CM

## 2022-10-08 ENCOUNTER — Encounter (HOSPITAL_COMMUNITY): Payer: Self-pay | Admitting: Occupational Therapy

## 2022-10-08 ENCOUNTER — Ambulatory Visit (HOSPITAL_COMMUNITY): Payer: BC Managed Care – PPO | Attending: Neurosurgery | Admitting: Occupational Therapy

## 2022-10-08 DIAGNOSIS — E559 Vitamin D deficiency, unspecified: Secondary | ICD-10-CM | POA: Insufficient documentation

## 2022-10-08 DIAGNOSIS — M5412 Radiculopathy, cervical region: Secondary | ICD-10-CM | POA: Insufficient documentation

## 2022-10-08 DIAGNOSIS — M0579 Rheumatoid arthritis with rheumatoid factor of multiple sites without organ or systems involvement: Secondary | ICD-10-CM | POA: Diagnosis not present

## 2022-10-08 DIAGNOSIS — M25511 Pain in right shoulder: Secondary | ICD-10-CM | POA: Diagnosis not present

## 2022-10-08 DIAGNOSIS — M75121 Complete rotator cuff tear or rupture of right shoulder, not specified as traumatic: Secondary | ICD-10-CM | POA: Insufficient documentation

## 2022-10-08 DIAGNOSIS — M7918 Myalgia, other site: Secondary | ICD-10-CM | POA: Insufficient documentation

## 2022-10-08 DIAGNOSIS — I10 Essential (primary) hypertension: Secondary | ICD-10-CM | POA: Insufficient documentation

## 2022-10-08 DIAGNOSIS — M25611 Stiffness of right shoulder, not elsewhere classified: Secondary | ICD-10-CM

## 2022-10-08 DIAGNOSIS — M2242 Chondromalacia patellae, left knee: Secondary | ICD-10-CM | POA: Diagnosis not present

## 2022-10-08 DIAGNOSIS — F4323 Adjustment disorder with mixed anxiety and depressed mood: Secondary | ICD-10-CM | POA: Insufficient documentation

## 2022-10-08 DIAGNOSIS — M2241 Chondromalacia patellae, right knee: Secondary | ICD-10-CM | POA: Insufficient documentation

## 2022-10-08 DIAGNOSIS — M19041 Primary osteoarthritis, right hand: Secondary | ICD-10-CM | POA: Insufficient documentation

## 2022-10-08 DIAGNOSIS — M19071 Primary osteoarthritis, right ankle and foot: Secondary | ICD-10-CM | POA: Diagnosis not present

## 2022-10-08 DIAGNOSIS — M19042 Primary osteoarthritis, left hand: Secondary | ICD-10-CM | POA: Diagnosis not present

## 2022-10-08 DIAGNOSIS — M19072 Primary osteoarthritis, left ankle and foot: Secondary | ICD-10-CM | POA: Insufficient documentation

## 2022-10-08 DIAGNOSIS — R29898 Other symptoms and signs involving the musculoskeletal system: Secondary | ICD-10-CM | POA: Diagnosis not present

## 2022-10-08 DIAGNOSIS — Z79899 Other long term (current) drug therapy: Secondary | ICD-10-CM | POA: Insufficient documentation

## 2022-10-08 DIAGNOSIS — R7303 Prediabetes: Secondary | ICD-10-CM | POA: Insufficient documentation

## 2022-10-08 NOTE — Therapy (Signed)
OUTPATIENT OCCUPATIONAL THERAPY ORTHO TREATMENT NOTE  Patient Name: [name] MRN: [id number] DOB:01-18-1971, 52 y.o., female Today's Date: 10/08/2022  PCP: Tula Nakayama, MD REFERRING PROVIDER: Marchia Bond, MD  END OF SESSION:  OT End of Session - 10/08/22 1509     Visit Number 4    Number of Visits 9    Date for OT Re-Evaluation 10/17/22    Authorization Type BC[employer]    OT Start Time [id number]    OT Stop Time [id number]    OT Time Calculation (min) 40 min    Activity Tolerance Patient tolerated treatment well    Behavior During Therapy [facility] for tasks assessed/performed             Past Medical History:  Diagnosis Date   Back pain    BV (bacterial vaginosis) 09/16/2013   Chest pain 2000   right    Contraceptive management 09/16/2013   Encephalocele 1994   Enlarged thyroid    HSV-2 (herpes simplex virus 2) infection    Hypertension    Irregular intermenstrual bleeding 04/27/2015   Obesity    Preterm labor    Sleep disorder    Thyroid nodule    Urinary frequency 12/14/2014   Vaginal discharge 09/16/2013   Past Surgical History:  Procedure Laterality Date   BREAST BIOPSY  2007   btl  [place] N/A 05/06/2016   Procedure: DILATATION & CURETTAGE/HYSTEROSCOPY WITH NOVASURE ABLATION;  Surgeon: Jonnie Kind, MD;  Location: [facility];  Service: Gynecology;  Laterality: N/A;  length-5; width-2.6; power-72 ; time- 1 minute 42 seconds   LAPAROSCOPIC BILATERAL SALPINGECTOMY Bilateral 05/06/2016   Procedure: LAPAROSCOPIC BILATERAL SALPINGECTOMY;  Surgeon: Jonnie Kind, MD;  Location: [facility];  Service: Gynecology;  Laterality: Bilateral;   RETAINED PLACENTA REMOVAL     TUBAL LIGATION     04/2016   tubal ligation reversal  2001   WISDOM TOOTH EXTRACTION     Patient Active Problem List   Diagnosis Date Noted   Torn rotator cuff 08/31/2022   RBC microcytosis 07/25/2022   Leukocytosis 07/25/2022   Chronic right shoulder  pain 07/21/2022   Generalized pain 05/05/2022   Elevated rheumatoid factor 05/05/2022   Adjustment reaction with anxiety and depression 04/03/2022   Screen for STD (sexually transmitted disease) 02/05/2022   Acute low back pain without sciatica 11/28/2021   Cervical radiculopathy 11/28/2021   Benign skin mole 03/07/2020   Callus of foot 03/07/2020   Chronic superficial venous thrombosis of lower extremity 06/05/2019   Allergic reaction 12/07/2018   Multiple allergies 11/28/2015   Prediabetes 08/01/2012   Overweight (BMI 25.0-29.9) 08/01/2012   Vitamin D deficiency 07/28/2012   Allergic rhinitis 10/16/2009   Neck pain 03/17/2009   HYPOKALEMIA 03/14/2008   THYROID NODULE 10/26/2007   Essential hypertension 10/26/2007   Back pain with radiation 10/26/2007    ONSET DATE: November 2023  REFERRING DIAG: R Rotator Cuff Tear  THERAPY DIAG:  Acute pain of right shoulder  Shoulder stiffness, right  Other symptoms and signs involving the musculoskeletal system  Rationale for Evaluation and Treatment: Rehabilitation  SUBJECTIVE:   SUBJECTIVE STATEMENT: "It is killing me it hurts so bad." Pt accompanied by: self  PERTINENT HISTORY: Pt was receiving therapy for her neck back in November 2023, she noticed her hand and ROM in her shoulder quickly worsened. Followed up with MD who completed an MRI and diagnosed her with a rotator cuff tear.   PRECAUTIONS: None  WEIGHT  BEARING RESTRICTIONS: No  PAIN:  Are you having pain? Yes: NPRS scale: 7/10 Pain location: Anterior shoulder girdle Pain description: Sore and achy Aggravating factors: movement Relieving factors: heat  FALLS: Has patient fallen in last 6 months? No  PATIENT GOALS: To get some ROM back and reduce pain - To avoid surgery  NEXT MD VISIT: Unsure  OBJECTIVE:   HAND DOMINANCE: Right  ADLs: Overall ADLs: Pt has increased pain with ADL's, which increases effort and time to complete. Unable to cook or cleaning  due to poor grip and weakness.   FUNCTIONAL OUTCOME MEASURES: FOTO: 40.70  UPPER EXTREMITY ROM:     Active ROM Right eval  Shoulder flexion 56  Shoulder abduction 54  Shoulder internal rotation 90  Shoulder external rotation 55  Elbow flexion 105  Elbow extension -23  (Blank rows = not tested)  UPPER EXTREMITY MMT:     MMT Right eval  Shoulder flexion   Shoulder abduction   Shoulder adduction   Shoulder extension   Shoulder internal rotation   Shoulder external rotation   Middle trapezius   Lower trapezius   Elbow flexion   Elbow extension   Wrist flexion   Wrist extension   Wrist ulnar deviation   Wrist radial deviation   Wrist pronation   Wrist supination   (Blank rows = not tested)  HAND FUNCTION: Grip strength: Right: 10 lbs; Left: 40 lbs  SENSATION: At times pt reports that her hand goes numb and tingly  EDEMA: No swelling noted at this time.  OBSERVATIONS: Moderate to severe fascial restrictions along the bicep, anterior shoulder girdle, and scapularis  TODAY'S TREATMENT:                                                                                                                              DATE:   10/08/22 -Manual Therapy: myofascial release and trigger point applied to the biceps, axillary region, scapularis region, and trapezius, in order to reduce pain and fascial restrictions to improve ROM.  -P/ROM: supine, flexion, abduction, horizontal abduction, er/IR, x5 -AA/ROM: supine, flexion, protraction, horizontal abduction, er/IR, x8 -Ball Rolls: flexion, abduction, x10 -PVC Pipe Slides: flexion, abduction  09/30/22 -Manual Therapy: myofascial release and trigger point applied to the biceps, axillary region, scapularis region, and trapezius, in order to reduce pain and fascial restrictions to improve ROM.  -AA/ROM: supine, flexion, protraction, horizontal abduction, er/IR, x10 -Table Slides: flexion, abduction, x10 -Pulleys: flexion, abduction,  x60"  09/12/22 -Manual Therapy: myofascial release and trigger point applied to the biceps, axillary region, scapularis region, and trapezius, in order to reduce pain and fascial restrictions to improve ROM.  -P/ROM: supine, flexion, abduction, horizontal abduction, er/IR, x10 -Ball Rolls: flexion and abduction, x10 -Thumb tacs x60"   PATIENT EDUCATION: Education details: AA/ROM Person educated: Patient Education method: Consulting civil engineer, Media planner, and Handouts Education comprehension: verbalized understanding and returned demonstration  HOME EXERCISE PROGRAM: 3/6: Pendulums and Table Slides  4/3: AA/ROM  GOALS:  Goals reviewed with patient? Yes  SHORT TERM GOALS: Target date: 10/17/22  Pt will be provided with and educated on HEP to improve RUE functional use during ADLs.  Goal status: IN PROGRESS  2.   Pt will decrease pain in RUE to 4/10 or less to sleep for 3+ consecutive hours without waking due to pain.  Goal status: IN PROGRESS  3.  Pt will decrease RUE fascial restrictions to min amounts or less to improve ability to perform functional reaching tasks with minimal increase in pain.   Goal status: IN PROGRESS  4.  Pt will improve RUE A/ROM by 30 degrees in order to reach overhead and behind back during dressing and bathing tasks.   Goal status: IN PROGRESS  5.  Pt will improve RUE strength to 4+/5 in order to complete lifting tasks required during cooking and cleaning tasks.   Goal status: IN PROGRESS   ASSESSMENT:  CLINICAL IMPRESSION: This session pt reporting that she is having increased pain with limited mobility. Throughout the sessions he reporting burning sensation down her arm and her fingers going numb towards the end of the session with exercises. OT had pt complete AA/ROM this session, along with some PVC slides, as she is not able to tolerate wall slides this session. With the PVC pipe slides she reported that her shoulder was popping in the joint in a  painful way with each flexion. Verbal and tactile cuing throughout session for positioning and technique.   PERFORMANCE DEFICITS: in functional skills including ADLs, IADLs, ROM, strength, pain, fascial restrictions, Gross motor control, body mechanics, and UE functional use.   PLAN:  OT FREQUENCY: 2x/week  OT DURATION: 4 weeks  PLANNED INTERVENTIONS: self care/ADL training, therapeutic exercise, therapeutic activity, manual therapy, passive range of motion, functional mobility training, electrical stimulation, ultrasound, moist heat, cryotherapy, patient/family education, coping strategies training, and DME and/or AE instructions  RECOMMENDED OTHER SERVICES: N/A  CONSULTED AND AGREED WITH PLAN OF CARE: Patient  PLAN FOR NEXT SESSION: Manual Therapy, P/ROM, Tables Slides, Isometrics, AA/ROM, Pulleys, scap ROM   Paulita Fujita, OTR/L [facility] [place], [place] 10/08/2022, 3:10 PM

## 2022-10-08 NOTE — Progress Notes (Signed)
Office Visit Note  Patient: [name]             Date of Birth: 10-09-1970           MRN: [id number]             PCP: Fayrene Helper, MD Referring: Fayrene Helper, MD Visit Date: 10/09/2022 Occupation: @GUAROCC @  Subjective:  Pain in multiple joints  History of Present Illness: [name] is a 52 y.o. female with history of positive rheumatoid factor and arthralgias.  She states she was given hydroxychloroquine but she could not tolerate the side effects.  She started having chest pain and she discontinued the medication.  She states she has changed her diet since then and has not been experiencing the joint discomfort.  She continues to have pain and discomfort in her right shoulder joint for which she was seen by Dr. Owens Shark an orthopedic surgeon.  She states she had MRI and was told that she had complete rotator cuff tear and will require surgery.  She was also evaluated by hematology in February for leukocytosis and no further workup was required.  She continues to have some discomfort in her legs.    Activities of Daily Living:  Patient reports morning stiffness for 6 hours.   Patient Reports nocturnal pain.  Difficulty dressing/grooming: Reports Difficulty climbing stairs: Reports Difficulty getting out of chair: Denies Difficulty using hands for taps, buttons, cutlery, and/or writing: Reports  Review of Systems  Constitutional:  Negative for fatigue.  HENT:  Negative for mouth sores and mouth dryness.   Eyes:  Negative for dryness.  Respiratory:  Negative for shortness of breath.   Cardiovascular:  Negative for chest pain and palpitations.  Gastrointestinal:  Negative for blood in stool, constipation and diarrhea.  Endocrine: Negative for increased urination.  Genitourinary:  Negative for involuntary urination.  Musculoskeletal:  Positive for joint pain, joint pain, muscle weakness, morning stiffness and muscle tenderness. Negative for gait problem, joint  swelling, myalgias and myalgias.  Skin:  Negative for color change, rash, hair loss and sensitivity to sunlight.  Allergic/Immunologic: Negative for susceptible to infections.  Neurological:  Negative for dizziness and headaches.  Hematological:  Negative for swollen glands.  Psychiatric/Behavioral:  Negative for depressed mood and sleep disturbance. The patient is not nervous/anxious.     PMFS History:  Patient Active Problem List   Diagnosis Date Noted   Torn rotator cuff 08/31/2022   RBC microcytosis 07/25/2022   Leukocytosis 07/25/2022   Chronic right shoulder pain 07/21/2022   Generalized pain 05/05/2022   Elevated rheumatoid factor 05/05/2022   Adjustment reaction with anxiety and depression 04/03/2022   Screen for STD (sexually transmitted disease) 02/05/2022   Acute low back pain without sciatica 11/28/2021   Cervical radiculopathy 11/28/2021   Benign skin mole 03/07/2020   Callus of foot 03/07/2020   Chronic superficial venous thrombosis of lower extremity 06/05/2019   Allergic reaction 12/07/2018   Multiple allergies 11/28/2015   Prediabetes 08/01/2012   Overweight (BMI 25.0-29.9) 08/01/2012   Vitamin D deficiency 07/28/2012   Allergic rhinitis 10/16/2009   Neck pain 03/17/2009   HYPOKALEMIA 03/14/2008   THYROID NODULE 10/26/2007   Essential hypertension 10/26/2007   Back pain with radiation 10/26/2007    Past Medical History:  Diagnosis Date   Back pain    BV (bacterial vaginosis) 09/16/2013   Chest pain 2000   right    Contraceptive management 09/16/2013   Encephalocele 1994   Enlarged thyroid  HSV-2 (herpes simplex virus 2) infection    Hypertension    Irregular intermenstrual bleeding 04/27/2015   Obesity    Preterm labor    Rotator cuff tear    Sleep disorder    Thyroid nodule    Urinary frequency 12/14/2014   Vaginal discharge 09/16/2013    Family History  Problem Relation Age of Onset   Anuerysm Mother    Stroke Mother    Cancer Father         thyroid    Hypertension Father    Cancer Paternal Grandmother        breast   Seizures Maternal Grandmother    Past Surgical History:  Procedure Laterality Date   BREAST BIOPSY  2007   btl  [place] N/A 05/06/2016   Procedure: DILATATION & CURETTAGE/HYSTEROSCOPY WITH NOVASURE ABLATION;  Surgeon: Jonnie Kind, MD;  Location: [facility];  Service: Gynecology;  Laterality: N/A;  length-5; width-2.6; power-72 ; time- 1 minute 42 seconds   LAPAROSCOPIC BILATERAL SALPINGECTOMY Bilateral 05/06/2016   Procedure: LAPAROSCOPIC BILATERAL SALPINGECTOMY;  Surgeon: Jonnie Kind, MD;  Location: [facility];  Service: Gynecology;  Laterality: Bilateral;   RETAINED PLACENTA REMOVAL     TUBAL LIGATION     04/2016   tubal ligation reversal  2001   WISDOM TOOTH EXTRACTION     Social History   Social History Narrative   Not on file   Immunization History  Administered Date(s) Administered   Influenza,inj,Quad PF,6+ Mos 08/31/2014   PFIZER(Purple Top)SARS-COV-2 Vaccination 02/18/2020   Td 12/18/2010   Tdap 07/07/2002     Objective: Vital Signs: BP 124/78 (BP Location: Left Arm, Patient Position: Sitting, Cuff Size: Normal)   Pulse 65   Resp 12   Ht 5\' 6"  (1.676 m)   Wt 177 lb (80.3 kg)   BMI 28.57 kg/m    Physical Exam Vitals and nursing note reviewed.  Constitutional:      Appearance: She is well-developed.  HENT:     Head: Normocephalic and atraumatic.  Eyes:     Conjunctiva/sclera: Conjunctivae normal.  Cardiovascular:     Rate and Rhythm: Normal rate and regular rhythm.     Heart sounds: Normal heart sounds.  Pulmonary:     Effort: Pulmonary effort is normal.     Breath sounds: Normal breath sounds.  Abdominal:     General: Bowel sounds are normal.     Palpations: Abdomen is soft.  Musculoskeletal:     Cervical back: Normal range of motion.  Lymphadenopathy:     Cervical: No cervical adenopathy.  Skin:     General: Skin is warm and dry.     Capillary Refill: Capillary refill takes less than 2 seconds.  Neurological:     Mental Status: She is alert and oriented to person, place, and time.  Psychiatric:        Behavior: Behavior normal.      Musculoskeletal Exam: She had limited lateral rotation of the cervical spine.  She had discomfort with range of motion of the lumbar spine.  Right shoulder joint abduction was limited to 30 degrees and painful.  Left shoulder joint was in full range of motion with discomfort.  Elbow joints were in good range of motion without any warmth swelling or effusion.  There was no tenderness over wrist joints.  She had tenderness over MCPs but no synovitis was noted.  PIP and DIP tenderness was noted.  Hip joints and knee  joints were in good range of motion.  No warmth swelling or effusion was noted.  She had no tenderness over ankles or MTPs.  She had generalized hyperalgesia and positive tender points.  CDAI Exam: CDAI Score: 4.7  Patient Global: 5 mm; Provider Global: 2 mm Swollen: 0 ; Tender: 4  Joint Exam 10/09/2022      Right  Left  Glenohumeral   Tender   Tender  MCP 1   Tender     MCP 2   Tender        Investigation: No additional findings.  Imaging: No results found.  Recent Labs: Lab Results  Component Value Date   WBC 9.3 07/25/2022   HGB 12.6 07/25/2022   PLT 338 07/25/2022   NA 139 06/18/2022   K 3.1 (L) 06/18/2022   CL 99 06/18/2022   CO2 28 06/18/2022   GLUCOSE 73 06/18/2022   BUN 12 06/18/2022   CREATININE 0.93 06/18/2022   BILITOT 0.6 06/18/2022   ALKPHOS 112 09/10/2021   AST 14 06/18/2022   ALT 15 06/18/2022   PROT 7.9 06/18/2022   ALBUMIN 4.3 09/10/2021   CALCIUM 9.7 06/18/2022   GFRAA 86 12/27/2019    Speciality Comments: PLQ Eye exam 07/23/2022 Dr. Leticia Clas Optometrist normal f/u 12 month  Procedures:  No procedures performed Allergies: Biaxin [clarithromycin], Iodinated contrast media, Depo-medrol  [methylprednisolone], Shellfish allergy, Yellow jacket venom [bee venom], and Peanut-containing drug products   Assessment / Plan:     Visit Diagnoses: Rheumatoid arthritis with rheumatoid factor of multiple sites without organ or systems involvement-positive RF, negative anti-CCP, normal sed rate, ultrasound was negative for synovitis.  She complains of pain and discomfort in multiple joints.  She was given a trial of hydroxychloroquine but she could not tolerate the medication.  She states she started having chest pain after taking hydroxychloroquine.  No synovitis was noted on the examination.  She continues to have pain and discomfort in multiple joints.  She was evaluated by orthopedics for right shoulder joint pain.  Patient states she was diagnosed with rotator cuff tear.  She will require surgery.  She had painful range of motion of her left shoulder.  She also had discomfort and tenderness over the MCP joints without any synovitis.  Patient believes her symptoms have improved since she has modified her diet.  I do not see a need for more aggressive immunosuppressive therapy at this point.  I advised her to contact us if she develops any increased swelling.  Otherwise we will see her back after the shoulder joint surgery.  Had brief discussion regarding other immunosuppressive agents.  She is hesitant to go on any immunosuppressive therapy.  High risk medication use -(Plaquenil 200 mg p.o. twice daily Monday to Friday.  -Discontinued due to chest pain) eye examination July 23, 2022  Nontraumatic complete tear of right rotator cuff-patient states that she had MRI of her shoulder joint and was told that she had complete rotator cuff tear requiring surgery.  She is not sure when she will schedule for the surgery.  She is trying dietary modifications.  She had limited range of motion of her right shoulder joint.  She also had discomfort range of motion of her left shoulder joint.  Primary  osteoarthritis of both hands-clinical and radiographic findings are consistent with osteoarthritis.  She continues to have discomfort over MCP joints.  No synovitis was noted.  Ultrasound was negative.  Primary osteoarthritis of both feet-she complains of discomfort in her feet.  She also gives history of pedal edema.  No pitting edema was noted today.  Chondromalacia of both patellae-she has chronic pain and discomfort in her knee joints.  No warmth swelling or effusion was noted.  Cervical radiculopathy-she had discomfort with lateral rotation of the cervical spine.  Myofascial pain-she has generalized pain, hyperalgesia and positive tender points.  Detailed counsel regarding fibromyalgia syndrome was provided.  Need for regular exercise and stretching was discussed.  I also discussed the possible use of Cymbalta which she can discuss with her PCP.  Essential hypertension-blood pressure was normal today.  Prediabetes  Adjustment reaction with anxiety and depression  Vitamin D deficiency-vitamin D was low in the past.  She was advised to take vitamin D supplement.  Orders: No orders of the defined types were placed in this encounter.  No orders of the defined types were placed in this encounter.    Follow-Up Instructions: Return in about 4 months (around 02/08/2023) for Rheumatoid arthritis.   Bo Merino, MD  Note - This record has been created using Editor, commissioning.  Chart creation errors have been sought, but may not always  have been located. Such creation errors do not reflect on  the standard of medical care.

## 2022-10-08 NOTE — Patient Instructions (Signed)

## 2022-10-09 ENCOUNTER — Ambulatory Visit (INDEPENDENT_AMBULATORY_CARE_PROVIDER_SITE_OTHER): Payer: BC Managed Care – PPO | Admitting: Rheumatology

## 2022-10-09 ENCOUNTER — Encounter: Payer: Self-pay | Admitting: Rheumatology

## 2022-10-09 VITALS — BP 124/78 | HR 65 | Resp 12 | Ht 66.0 in | Wt 177.0 lb

## 2022-10-09 DIAGNOSIS — M2241 Chondromalacia patellae, right knee: Secondary | ICD-10-CM

## 2022-10-09 DIAGNOSIS — M2242 Chondromalacia patellae, left knee: Secondary | ICD-10-CM

## 2022-10-09 DIAGNOSIS — F4323 Adjustment disorder with mixed anxiety and depressed mood: Secondary | ICD-10-CM

## 2022-10-09 DIAGNOSIS — M19041 Primary osteoarthritis, right hand: Secondary | ICD-10-CM | POA: Diagnosis not present

## 2022-10-09 DIAGNOSIS — R7303 Prediabetes: Secondary | ICD-10-CM

## 2022-10-09 DIAGNOSIS — E559 Vitamin D deficiency, unspecified: Secondary | ICD-10-CM

## 2022-10-09 DIAGNOSIS — M75121 Complete rotator cuff tear or rupture of right shoulder, not specified as traumatic: Secondary | ICD-10-CM | POA: Diagnosis not present

## 2022-10-09 DIAGNOSIS — M7918 Myalgia, other site: Secondary | ICD-10-CM

## 2022-10-09 DIAGNOSIS — Z79899 Other long term (current) drug therapy: Secondary | ICD-10-CM | POA: Diagnosis not present

## 2022-10-09 DIAGNOSIS — M19072 Primary osteoarthritis, left ankle and foot: Secondary | ICD-10-CM

## 2022-10-09 DIAGNOSIS — M19071 Primary osteoarthritis, right ankle and foot: Secondary | ICD-10-CM

## 2022-10-09 DIAGNOSIS — M0579 Rheumatoid arthritis with rheumatoid factor of multiple sites without organ or systems involvement: Secondary | ICD-10-CM | POA: Diagnosis not present

## 2022-10-09 DIAGNOSIS — I10 Essential (primary) hypertension: Secondary | ICD-10-CM

## 2022-10-09 DIAGNOSIS — M19042 Primary osteoarthritis, left hand: Secondary | ICD-10-CM

## 2022-10-09 DIAGNOSIS — M5412 Radiculopathy, cervical region: Secondary | ICD-10-CM

## 2022-10-10 ENCOUNTER — Ambulatory Visit: Payer: BC Managed Care – PPO | Admitting: Physician Assistant

## 2022-10-10 ENCOUNTER — Encounter (HOSPITAL_COMMUNITY): Payer: BC Managed Care – PPO | Admitting: Occupational Therapy

## 2022-10-14 ENCOUNTER — Encounter (HOSPITAL_COMMUNITY): Payer: Self-pay | Admitting: Occupational Therapy

## 2022-10-14 ENCOUNTER — Ambulatory Visit (HOSPITAL_COMMUNITY): Payer: BC Managed Care – PPO | Admitting: Occupational Therapy

## 2022-10-14 DIAGNOSIS — M25511 Pain in right shoulder: Secondary | ICD-10-CM | POA: Diagnosis not present

## 2022-10-14 DIAGNOSIS — Z79899 Other long term (current) drug therapy: Secondary | ICD-10-CM | POA: Diagnosis not present

## 2022-10-14 DIAGNOSIS — M5412 Radiculopathy, cervical region: Secondary | ICD-10-CM | POA: Diagnosis not present

## 2022-10-14 DIAGNOSIS — R29898 Other symptoms and signs involving the musculoskeletal system: Secondary | ICD-10-CM | POA: Diagnosis not present

## 2022-10-14 DIAGNOSIS — M19041 Primary osteoarthritis, right hand: Secondary | ICD-10-CM | POA: Diagnosis not present

## 2022-10-14 DIAGNOSIS — M2241 Chondromalacia patellae, right knee: Secondary | ICD-10-CM | POA: Diagnosis not present

## 2022-10-14 DIAGNOSIS — M25611 Stiffness of right shoulder, not elsewhere classified: Secondary | ICD-10-CM

## 2022-10-14 DIAGNOSIS — M2242 Chondromalacia patellae, left knee: Secondary | ICD-10-CM | POA: Diagnosis not present

## 2022-10-14 DIAGNOSIS — M19042 Primary osteoarthritis, left hand: Secondary | ICD-10-CM | POA: Diagnosis not present

## 2022-10-14 DIAGNOSIS — M19071 Primary osteoarthritis, right ankle and foot: Secondary | ICD-10-CM | POA: Diagnosis not present

## 2022-10-14 DIAGNOSIS — M75121 Complete rotator cuff tear or rupture of right shoulder, not specified as traumatic: Secondary | ICD-10-CM | POA: Diagnosis not present

## 2022-10-14 DIAGNOSIS — M7918 Myalgia, other site: Secondary | ICD-10-CM | POA: Diagnosis not present

## 2022-10-14 DIAGNOSIS — M0579 Rheumatoid arthritis with rheumatoid factor of multiple sites without organ or systems involvement: Secondary | ICD-10-CM | POA: Diagnosis not present

## 2022-10-14 DIAGNOSIS — I10 Essential (primary) hypertension: Secondary | ICD-10-CM | POA: Diagnosis not present

## 2022-10-14 DIAGNOSIS — R7303 Prediabetes: Secondary | ICD-10-CM | POA: Diagnosis not present

## 2022-10-14 DIAGNOSIS — M19072 Primary osteoarthritis, left ankle and foot: Secondary | ICD-10-CM | POA: Diagnosis not present

## 2022-10-14 NOTE — Therapy (Signed)
OUTPATIENT OCCUPATIONAL THERAPY ORTHO TREATMENT NOTE DISCHARGE NOTE  Patient Name: [name] MRN: [id number] DOB:03/03/1971, 52 y.o., female Today's Date: 10/14/2022  PCP: Syliva OvermanSimpson, Margaret, MD REFERRING PROVIDER: Teryl LucyLandau, Joshua, MD  OCCUPATIONAL THERAPY DISCHARGE SUMMARY  Visits from Start of Care: 5  Current functional level related to goals / functional outcomes: Pt has met 1 out of 5 goals. She was provided an HEP to promote and improve mobility as she is able to tolerate.    Remaining deficits: Pt did not meet 4 out of 5 goals. She continues to have significant pain, over 5/10 which limits sleeping and other tasks. She continues to have moderate to severe fascial restrictions and recently was unable to tolerate more than light touch during manual therapy due to severe pain. Pt's ROM continues to be limited around 40-45% of full ROM. She is unable to tolerate any resistance at this time due to pain and weakness, therefore her strength is below 3/5 overall.    Education / Equipment: Pt was provided a comprehensive HEP for her tolerance and level of mobility.    Plan: Patient agrees to discharge as her pain is worsening and she has elected to have surgery.      END OF SESSION:  OT End of Session - 10/14/22 1515     Visit Number 5    Number of Visits 9    Date for OT Re-Evaluation 10/17/22    Authorization Type [employer]    OT Start Time 1520    OT Stop Time 1600    OT Time Calculation (min) 40 min    Activity Tolerance Patient tolerated treatment well    Behavior [facility] for tasks assessed/performed             Past Medical History:  Diagnosis Date   Back pain    BV (bacterial vaginosis) 09/16/2013   Chest pain 2000   right    Contraceptive management 09/16/2013   Encephalocele 1994   Enlarged thyroid    HSV-2 (herpes simplex virus 2) infection    Hypertension    Irregular intermenstrual bleeding 04/27/2015   Obesity    Preterm labor    Rotator  cuff tear    Sleep disorder    Thyroid nodule    Urinary frequency 12/14/2014   Vaginal discharge 09/16/2013   Past Surgical History:  Procedure Laterality Date   BREAST BIOPSY  2007   btl  1994   DILITATION & CURRETTAGE/HYSTROSCOPY WITH NOVASURE ABLATION N/A 05/06/2016   Procedure: DILATATION & CURETTAGE/HYSTEROSCOPY WITH NOVASURE ABLATION;  Surgeon: Tilda BurrowJohn V Ferguson, MD;  Location: [facility];  Service: Gynecology;  Laterality: N/A;  length-5; width-2.6; power-72 ; time- 1 minute 42 seconds   LAPAROSCOPIC BILATERAL SALPINGECTOMY Bilateral 05/06/2016   Procedure: LAPAROSCOPIC BILATERAL SALPINGECTOMY;  Surgeon: Tilda BurrowJohn V Ferguson, MD;  Location: [facility];  Service: Gynecology;  Laterality: Bilateral;   RETAINED PLACENTA REMOVAL     TUBAL LIGATION     04/2016   tubal ligation reversal  2001   WISDOM TOOTH EXTRACTION     Patient Active Problem List   Diagnosis Date Noted   Torn rotator cuff 08/31/2022   RBC microcytosis 07/25/2022   Leukocytosis 07/25/2022   Chronic right shoulder pain 07/21/2022   Generalized pain 05/05/2022   Elevated rheumatoid factor 05/05/2022   Adjustment reaction with anxiety and depression 04/03/2022   Screen for STD (sexually transmitted disease) 02/05/2022   Acute low back pain without sciatica 11/28/2021   Cervical radiculopathy 11/28/2021   Benign  skin mole 03/07/2020   Callus of foot 03/07/2020   Chronic superficial venous thrombosis of lower extremity 06/05/2019   Allergic reaction 12/07/2018   Multiple allergies 11/28/2015   Prediabetes 08/01/2012   Overweight (BMI 25.0-29.9) 08/01/2012   Vitamin D deficiency 07/28/2012   Allergic rhinitis 10/16/2009   Neck pain 03/17/2009   HYPOKALEMIA 03/14/2008   THYROID NODULE 10/26/2007   Essential hypertension 10/26/2007   Back pain with radiation 10/26/2007    ONSET DATE: November 2023  REFERRING DIAG: R Rotator Cuff Tear  THERAPY DIAG:  Acute pain of right shoulder  Shoulder stiffness, right  Other  symptoms and signs involving the musculoskeletal system  Rationale for Evaluation and Treatment: Rehabilitation  SUBJECTIVE:   SUBJECTIVE STATEMENT: "I couldn't even brush my teeth this morning" Pt accompanied by: self  PERTINENT HISTORY: Pt was receiving therapy for her neck back in November 2023, she noticed her hand and ROM in her shoulder quickly worsened. Followed up with MD who completed an MRI and diagnosed her with a rotator cuff tear.   PRECAUTIONS: None  WEIGHT BEARING RESTRICTIONS: No  PAIN:  Are you having pain? Yes: NPRS scale: 4/10 Pain location: Anterior shoulder girdle Pain description: Sore and achy Aggravating factors: movement Relieving factors: heat  FALLS: Has patient fallen in last 6 months? No  PATIENT GOALS: To get some ROM back and reduce pain - To avoid surgery  NEXT MD VISIT: Unsure  OBJECTIVE:   HAND DOMINANCE: Right  ADLs: Overall ADLs: Pt has increased pain with ADL's, which increases effort and time to complete. Unable to cook or cleaning due to poor grip and weakness.   FUNCTIONAL OUTCOME MEASURES: FOTO: 40.70 10/14/22: 55.37  UPPER EXTREMITY ROM:     Active ROM Right eval Right 10/14/22  Shoulder flexion 56 96  Shoulder abduction 54 67  Shoulder internal rotation 90 90  Shoulder external rotation 55 55  Elbow flexion 105 127  Elbow extension -23 -12  (Blank rows = not tested)  UPPER EXTREMITY MMT:     MMT Right eval  Shoulder flexion   Shoulder abduction   Shoulder adduction   Shoulder extension   Shoulder internal rotation   Shoulder external rotation   (Blank rows = not tested)  **Unable to tolerate MMT resistance due to pain  HAND FUNCTION: Grip strength: Right: 10 lbs; Left: 40 lbs  SENSATION: At times pt reports that her hand goes numb and tingly  EDEMA: No swelling noted at this time.  OBSERVATIONS: Moderate to severe fascial restrictions along the bicep, anterior shoulder girdle, and scapularis  TODAY'S  TREATMENT:                                                                                                                              DATE:   10/14/22 -P/ROM: supine, flexion, abduction, horizontal abduction, er/IR, x5 -AA/ROM: supine, flexion, protraction, horizontal abduction, er/IR, x10 -AA/ROM: seated, flexion, protraction, horizontal abduction, er/IR, x8 Measurements completed for  Reassessment  10/08/22 -Manual Therapy: myofascial release and trigger point applied to the biceps, axillary region, scapularis region, and trapezius, in order to reduce pain and fascial restrictions to improve ROM.  -P/ROM: supine, flexion, abduction, horizontal abduction, er/IR, x5 -AA/ROM: supine, flexion, protraction, horizontal abduction, er/IR, x8 -Ball Rolls: flexion, abduction, x10 -PVC Pipe Slides: flexion, abduction  09/30/22 -Manual Therapy: myofascial release and trigger point applied to the biceps, axillary region, scapularis region, and trapezius, in order to reduce pain and fascial restrictions to improve ROM.  -AA/ROM: supine, flexion, protraction, horizontal abduction, er/IR, x10 -Table Slides: flexion, abduction, x10 -Pulleys: flexion, abduction, x60"   PATIENT EDUCATION: Education details: Review HEP Person educated: Patient Education method: Programmer, multimedia, Demonstration, and Handouts Education comprehension: verbalized understanding and returned demonstration  HOME EXERCISE PROGRAM: 3/6: Pendulums and Table Slides  4/3: AA/ROM  GOALS: Goals reviewed with patient? Yes  SHORT TERM GOALS: Target date: 10/17/22  Pt will be provided with and educated on HEP to improve RUE functional use during ADLs.  Goal status: MET  2.   Pt will decrease pain in RUE to 4/10 or less to sleep for 3+ consecutive hours without waking due to pain.  Goal status: NOT MET  3.  Pt will decrease RUE fascial restrictions to min amounts or less to improve ability to perform functional reaching tasks with  minimal increase in pain.   Goal status: NOT MET  4.  Pt will improve RUE A/ROM by 30 degrees in order to reach overhead and behind back during dressing and bathing tasks.   Goal status: NOT MET  5.  Pt will improve RUE strength to 4+/5 in order to complete lifting tasks required during cooking and cleaning tasks.   Goal status: NOT MET   ASSESSMENT:  CLINICAL IMPRESSION: Pt completed reassessment this session and elected to discharge from OP OT this session due to increasing pain and pending shoulder surgery. Her ROM has improved minutely however she continues to be unable to functional reach towards 50% of full ROM. Pt can not tolerate any resistance against her motions due to pain. At this time pt has not met any OT goals. Verbal and tactile cuing provided throughout for positioning, technique, and limiting compensatory strategies.   PERFORMANCE DEFICITS: in functional skills including ADLs, IADLs, ROM, strength, pain, fascial restrictions, Gross motor control, body mechanics, and UE functional use.   PLAN:  OT FREQUENCY: 2x/week  OT DURATION: 4 weeks  PLANNED INTERVENTIONS: self care/ADL training, therapeutic exercise, therapeutic activity, manual therapy, passive range of motion, functional mobility training, electrical stimulation, ultrasound, moist heat, cryotherapy, patient/family education, coping strategies training, and DME and/or AE instructions  RECOMMENDED OTHER SERVICES: N/A  CONSULTED AND AGREED WITH PLAN OF CARE: Patient  PLAN FOR NEXT SESSION: Discharged   Tavaras Goody Bing Plume, OTR/L [facility] [phone] [name], OT 10/14/2022, 3:16 PM

## 2022-10-20 ENCOUNTER — Emergency Department (HOSPITAL_COMMUNITY): Payer: BC Managed Care – PPO

## 2022-10-20 ENCOUNTER — Other Ambulatory Visit: Payer: Self-pay

## 2022-10-20 ENCOUNTER — Encounter (HOSPITAL_COMMUNITY): Payer: Self-pay | Admitting: Emergency Medicine

## 2022-10-20 ENCOUNTER — Emergency Department (HOSPITAL_COMMUNITY)
Admission: EM | Admit: 2022-10-20 | Discharge: 2022-10-21 | Disposition: A | Discharge status: Home / Self Care | Payer: BC Managed Care – PPO | Attending: Emergency Medicine | Admitting: Emergency Medicine

## 2022-10-20 DIAGNOSIS — K449 Diaphragmatic hernia without obstruction or gangrene: Secondary | ICD-10-CM | POA: Diagnosis not present

## 2022-10-20 DIAGNOSIS — Z91010 Allergy to peanuts: Secondary | ICD-10-CM | POA: Insufficient documentation

## 2022-10-20 DIAGNOSIS — I10 Essential (primary) hypertension: Secondary | ICD-10-CM | POA: Diagnosis not present

## 2022-10-20 DIAGNOSIS — E876 Hypokalemia: Secondary | ICD-10-CM

## 2022-10-20 DIAGNOSIS — R102 Pelvic and perineal pain: Secondary | ICD-10-CM

## 2022-10-20 DIAGNOSIS — K529 Noninfective gastroenteritis and colitis, unspecified: Secondary | ICD-10-CM | POA: Diagnosis not present

## 2022-10-20 DIAGNOSIS — R1030 Lower abdominal pain, unspecified: Secondary | ICD-10-CM | POA: Diagnosis not present

## 2022-10-20 DIAGNOSIS — Z79899 Other long term (current) drug therapy: Secondary | ICD-10-CM | POA: Diagnosis not present

## 2022-10-20 DIAGNOSIS — R1032 Left lower quadrant pain: Secondary | ICD-10-CM | POA: Insufficient documentation

## 2022-10-20 DIAGNOSIS — K5000 Crohn's disease of small intestine without complications: Secondary | ICD-10-CM

## 2022-10-20 LAB — COMPREHENSIVE METABOLIC PANEL
ALT: 18 U/L (ref 0–44)
AST: 23 U/L (ref 15–41)
Albumin: 3.9 g/dL (ref 3.5–5.0)
Alkaline Phosphatase: 94 U/L (ref 38–126)
Anion gap: 11 (ref 5–15)
BUN: 10 mg/dL (ref 6–20)
CO2: 27 mmol/L (ref 22–32)
Calcium: 8.5 mg/dL — ABNORMAL LOW (ref 8.9–10.3)
Chloride: 97 mmol/L — ABNORMAL LOW (ref 98–111)
Creatinine, Ser: 0.97 mg/dL (ref 0.44–1.00)
GFR, Estimated: >60 mL/min (ref >60)
Glucose, Bld: 94 mg/dL (ref 70–99)
Potassium: 2.2 mmol/L — CL (ref 3.5–5.1)
Sodium: 135 mmol/L (ref 135–145)
Total Bilirubin: 0.2 mg/dL — ABNORMAL LOW (ref 0.3–1.2)
Total Protein: 7.7 g/dL (ref 6.5–8.1)

## 2022-10-20 LAB — PREGNANCY, URINE: Preg Test, Ur: NEGATIVE

## 2022-10-20 LAB — CBC
HCT: 37.5 % (ref 36.0–46.0)
Hemoglobin: 11.9 g/dL — ABNORMAL LOW (ref 12.0–15.0)
MCH: 22.2 pg — ABNORMAL LOW (ref 26.0–34.0)
MCHC: 31.7 g/dL (ref 30.0–36.0)
MCV: 70.1 fL — ABNORMAL LOW (ref 80.0–100.0)
Platelets: 326 10*3/uL (ref 150–400)
RBC: 5.35 MIL/uL — ABNORMAL HIGH (ref 3.87–5.11)
RDW: 15.6 % — ABNORMAL HIGH (ref 11.5–15.5)
WBC: 10.7 10*3/uL — ABNORMAL HIGH (ref 4.0–10.5)
nRBC: 0 % (ref 0.0–0.2)

## 2022-10-20 LAB — URINALYSIS, ROUTINE W REFLEX MICROSCOPIC
Bilirubin Urine: NEGATIVE
Glucose, UA: NEGATIVE mg/dL
Hgb urine dipstick: NEGATIVE
Ketones, ur: NEGATIVE mg/dL
Leukocytes,Ua: NEGATIVE
Nitrite: NEGATIVE
Protein, ur: NEGATIVE mg/dL
Specific Gravity, Urine: 1.003 — ABNORMAL LOW (ref 1.005–1.030)
pH: 6 (ref 5.0–8.0)

## 2022-10-20 LAB — LIPASE, BLOOD: Lipase: 58 U/L — ABNORMAL HIGH (ref 11–51)

## 2022-10-20 NOTE — ED Provider Notes (Signed)
[place] EMERGENCY DEPARTMENT AT [facility] Provider Note   CSN: [id number] Arrival date & time: 10/20/22  2217     History  Chief Complaint  Patient presents with   Abdominal Pain    [name] is a 52 y.o. female.  Patient is a 52 year old female with past medical history of possible rheumatoid arthritis, hypertension.  Patient presenting today with complaints of abdominal pain.  She describes sudden onset of pain to the suprapubic region radiating to her rectum.  This started earlier this evening in the absence of any injury or trauma.  She denies any bowel or bladder complaints.  She denies any fevers or chills.  She denies any bloody stools.  No aggravating or alleviating factors.  The history is provided by the patient.       Home Medications Prior to Admission medications   Medication Sig Start Date End Date Taking? Authorizing Provider  cetirizine HCl (ZYRTEC) 1 MG/ML solution TAKE 1/2 TO 1 TEASPOON BY MOUTH DAILY AS , NEEDED, FOR ALLERGIES (OTC) 05/07/21   Anabel Halon, MD  diltiazem (CARDIZEM CD) 120 MG 24 hr capsule Take 1 capsule (120 mg total) by mouth daily. 04/14/22   Kerri Perches, MD  hydrochlorothiazide (HYDRODIURIL) 25 MG tablet TAKE 1 TABLET (25 MG TOTAL) BY MOUTH DAILY. 07/08/22   Kerri Perches, MD  metoprolol tartrate (LOPRESSOR) 25 MG tablet TAKE 1/2 TABLET BY MOUTH TWICE DAILY 08/12/22   Kerri Perches, MD  potassium chloride SA (KLOR-CON M20) 20 MEQ tablet TAKE 2 TABLETS BY MOUTH TWICE A DAY 06/24/22   Kerri Perches, MD      Allergies    Biaxin [clarithromycin], Iodinated contrast media, Depo-medrol [methylprednisolone], Shellfish allergy, Yellow jacket venom [bee venom], and Peanut-containing drug products    Review of Systems   Review of Systems  All other systems reviewed and are negative.   Physical Exam Updated Vital Signs BP (!) 133/90 (BP Location: Right Arm)   Pulse 78   Temp 98.9 F (37.2 C) (Oral)    Resp 16   Wt 80.2 kg   SpO2 99%   BMI 28.54 kg/m  Physical Exam Vitals and nursing note reviewed.  Constitutional:      General: She is not in acute distress.    Appearance: She is well-developed. She is not diaphoretic.  HENT:     Head: Normocephalic and atraumatic.  Cardiovascular:     Rate and Rhythm: Normal rate and regular rhythm.     Heart sounds: No murmur heard.    No friction rub. No gallop.  Pulmonary:     Effort: Pulmonary effort is normal. No respiratory distress.     Breath sounds: Normal breath sounds. No wheezing.  Abdominal:     General: Bowel sounds are normal. There is no distension.     Palpations: Abdomen is soft.     Tenderness: There is abdominal tenderness in the suprapubic area and left lower quadrant. There is no right CVA tenderness, left CVA tenderness, guarding or rebound.  Musculoskeletal:        General: Normal range of motion.     Cervical back: Normal range of motion and neck supple.  Skin:    General: Skin is warm and dry.  Neurological:     General: No focal deficit present.     Mental Status: She is alert and oriented to person, place, and time.     ED Results / Procedures / Treatments   Labs (all labs  ordered are listed, but only abnormal results are displayed) Labs Reviewed  CBC - Abnormal; Notable for the following components:      Result Value   WBC 10.7 (*)    RBC 5.35 (*)    Hemoglobin 11.9 (*)    MCV 70.1 (*)    MCH 22.2 (*)    RDW 15.6 (*)    All other components within normal limits  URINALYSIS, ROUTINE W REFLEX MICROSCOPIC - Abnormal; Notable for the following components:   Color, Urine STRAW (*)    Specific Gravity, Urine 1.003 (*)    All other components within normal limits  LIPASE, BLOOD  COMPREHENSIVE METABOLIC PANEL    EKG None  Radiology No results found.  Procedures Procedures  {Document cardiac monitor, telemetry assessment procedure when appropriate:1}  Medications Ordered in ED Medications - No  data to display  ED Course/ Medical Decision Making/ A&P   {   Click here for ABCD2, HEART and other calculatorsREFRESH Note before signing :1}                          Medical Decision Making Amount and/or Complexity of Data Reviewed Labs: ordered. Radiology: ordered.   ***  {Document critical care time when appropriate:1} {Document review of labs and clinical decision tools ie heart score, Chads2Vasc2 etc:1}  {Document your independent review of radiology images, and any outside records:1} {Document your discussion with family members, caretakers, and with consultants:1} {Document social determinants of health affecting pt's care:1} {Document your decision making why or why not admission, treatments were needed:1} Final Clinical Impression(s) / ED Diagnoses Final diagnoses:  None    Rx / DC Orders ED Discharge Orders     None

## 2022-10-20 NOTE — ED Notes (Signed)
ED Provider at bedside. 

## 2022-10-20 NOTE — ED Triage Notes (Signed)
Pt states spontaneously started have a sharp pain in lower abd radiates to rectum. Denies any vomiting or diarrhea.

## 2022-10-21 ENCOUNTER — Telehealth: Payer: Self-pay | Admitting: Family Medicine

## 2022-10-21 DIAGNOSIS — K449 Diaphragmatic hernia without obstruction or gangrene: Secondary | ICD-10-CM | POA: Diagnosis not present

## 2022-10-21 NOTE — Discharge Instructions (Signed)
Take ibuprofen 600 mg every 6 hours as needed for pain.  Take 4 of your potassium tablets this evening, then another 4 in the morning, then resume taking as before.  Follow-up with your primary doctor to schedule an MRI of your abdomen and have your potassium rechecked.  Return to the ER if you develop severe abdominal pain, high fevers, bloody stools, or for other new and concerning symptoms.

## 2022-10-21 NOTE — Telephone Encounter (Signed)
[employer] forms  Copied Noted sleeved

## 2022-10-22 DIAGNOSIS — M25511 Pain in right shoulder: Secondary | ICD-10-CM | POA: Diagnosis not present

## 2022-10-23 ENCOUNTER — Encounter: Payer: Self-pay | Admitting: Family Medicine

## 2022-10-23 ENCOUNTER — Ambulatory Visit (INDEPENDENT_AMBULATORY_CARE_PROVIDER_SITE_OTHER): Payer: BC Managed Care – PPO | Admitting: Family Medicine

## 2022-10-23 VITALS — BP 130/84 | HR 59 | Resp 16 | Ht 66.0 in | Wt 177.4 lb

## 2022-10-23 DIAGNOSIS — R1030 Lower abdominal pain, unspecified: Secondary | ICD-10-CM

## 2022-10-23 DIAGNOSIS — E876 Hypokalemia: Secondary | ICD-10-CM | POA: Diagnosis not present

## 2022-10-23 DIAGNOSIS — F4323 Adjustment disorder with mixed anxiety and depressed mood: Secondary | ICD-10-CM

## 2022-10-23 DIAGNOSIS — R935 Abnormal findings on diagnostic imaging of other abdominal regions, including retroperitoneum: Secondary | ICD-10-CM | POA: Diagnosis not present

## 2022-10-23 DIAGNOSIS — Z1211 Encounter for screening for malignant neoplasm of colon: Secondary | ICD-10-CM

## 2022-10-23 DIAGNOSIS — M75101 Unspecified rotator cuff tear or rupture of right shoulder, not specified as traumatic: Secondary | ICD-10-CM

## 2022-10-23 DIAGNOSIS — I10 Essential (primary) hypertension: Secondary | ICD-10-CM

## 2022-10-23 DIAGNOSIS — D72825 Bandemia: Secondary | ICD-10-CM

## 2022-10-23 DIAGNOSIS — Z09 Encounter for follow-up examination after completed treatment for conditions other than malignant neoplasm: Secondary | ICD-10-CM

## 2022-10-23 NOTE — Patient Instructions (Addendum)
F/U in 4. 5 months, call if you need me sooner  Non fasting chem 7 and EGFr in 2 weeks   All the best with right shoulder  surgery  You do not need therapy  We will send lab order for non fasting labs you need in next 2 weeks  Thanks for choosing [facility], we consider it a privelige to serve you.

## 2022-10-27 ENCOUNTER — Encounter: Payer: Self-pay | Admitting: Family Medicine

## 2022-10-27 DIAGNOSIS — Z1211 Encounter for screening for malignant neoplasm of colon: Secondary | ICD-10-CM | POA: Insufficient documentation

## 2022-10-27 DIAGNOSIS — Z09 Encounter for follow-up examination after completed treatment for conditions other than malignant neoplasm: Secondary | ICD-10-CM | POA: Insufficient documentation

## 2022-10-27 NOTE — Assessment & Plan Note (Signed)
Surgery planned in the near furue, unable to use RUE due to injury

## 2022-10-27 NOTE — Assessment & Plan Note (Signed)
Has had hematology eval

## 2022-10-27 NOTE — Progress Notes (Signed)
[name]     MRN: [id number]      DOB: [birth year]   HPI [name] is here for follow up and re-evaluation of chronic medical conditions, medication management and review of any available recent lab and radiology data.  Preventive health is updated, specifically  Cancer screening and Immunization.   Was recently in the ED on 10/20/2022, with c/o disabling abdominal pain , acute onset radiology report is reviewed at visit , needs GI referral , thickened terminal ileal wall and hypodensities in body of pancreas, diverticulosis, constipation and hypokalemia noted also elevated wbc Has upcoming  right shoulder surgery palnned, this is currently preventing her from working The PT denies any adverse reactions to current medications since the last visit.  There are no new concerns.  There are no specific complaints   ROS Denies recent fever or chills. Denies sinus pressure, nasal congestion, ear pain or sore throat. Denies chest congestion, productive cough or wheezing. Denies chest pains, palpitations and leg swelling Denies dysuria, frequency, hesitancy or incontinence. Denies headaches, seizures, numbness, or tingling. Denies uncontrolled depression, anxiety or insomnia. Denies skin break down or rash.   PE  BP 130/84   Pulse (!) 59   Resp 16   Ht  (1.676 m)   Wt 177 lb 6.4 oz (80.5 kg)   SpO2 96%   BMI 28.63 kg/m   Patient alert and oriented and in no cardiopulmonary distress.  HEENT: No facial asymmetry, EOMI,     Neck supple .  Chest: Clear to auscultation bilaterally.  CVS: S1, S2 no murmurs, no S3.Regular rate.   Ext: No edema  MS: Adequate ROM spine, , hips and knees.Markedly decreased in right shoulder  Skin: Intact, no ulcerations or rash noted.  Psych: Good eye contact, normal affect. Memory intact not anxious or depressed appearing.  CNS: CN 2-12 intact, power,  normal throughout.no focal deficits noted.   Assessment & Plan  Abdominal pain Less  severe than on 4/15 when she presented in the ED, needs GI f/u due to abnormalities noted on report and possible re imaging in next severl moths  Colon cancer screening Past due with abn scan of terminal ileum , refer GI  HYPOKALEMIA Recurrent and severe, non compliant with meds and has had Nephrology eval, re eval in 2 weeks  Leukocytosis Has had hematology eval  Essential hypertension Controlled, no change in medication DASH diet and commitment to daily physical activity for a minimum of 30 minutes discussed and encouraged, as a part of hypertension management. The importance of attaining a healthy weight is also discussed.     10/23/2022    4:52 PM 10/23/2022    4:17 PM 10/21/2022    1:03 AM 10/20/2022   10:27 PM 10/20/2022   10:25 PM 10/09/2022    2:03 PM 08/29/2022    3:30 PM  BP/Weight  Systolic BP 130 143 130 133  124 130  Diastolic BP 84 84 89 90  78 78  Wt. 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 Discharge  summary, and laboratory and radiology data are reviewed, and any questions or concerns  are discussed. Specific issues requiring follow up are specifically addressed.

## 2022-10-27 NOTE — Assessment & Plan Note (Signed)
Controlled, no change in medication DASH diet and commitment to daily physical activity for a minimum of 30 minutes discussed and encouraged, as a part of hypertension management. The importance of attaining a healthy weight is also discussed.     10/23/2022    4:52 PM 10/23/2022    4:17 PM 10/21/2022    1:03 AM 10/20/2022   10:27 PM 10/20/2022   10:25 PM 10/09/2022    2:03 PM 08/29/2022    3:30 PM  BP/Weight  Systolic BP 130 143 130 133  124 130  Diastolic BP 84 84 89 90  78 78  Wt. (Lbs)  177.4   176.81 177 175  BMI  28.63 kg/m2   28.54 kg/m2 28.57 kg/m2 28.25 kg/m2

## 2022-10-27 NOTE — Assessment & Plan Note (Signed)
Past due with abn scan of terminal ileum , refer GI

## 2022-10-27 NOTE — Assessment & Plan Note (Signed)
Less severe than on 4/15 when she presented in the ED, needs GI f/u due to abnormalities noted on report and possible re imaging in next severl moths

## 2022-10-27 NOTE — Assessment & Plan Note (Signed)
Patient in for follow up of recent ED visit ?Discharge summary, and laboratory and radiology data are reviewed, and any questions or concerns  are discussed. ?Specific issues requiring follow up are specifically addressed. ? ?

## 2022-10-27 NOTE — Assessment & Plan Note (Signed)
Recurrent and severe, non compliant with meds and has had Nephrology eval, re eval in 2 weeks

## 2022-10-27 NOTE — Assessment & Plan Note (Signed)
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

## 2022-10-31 ENCOUNTER — Telehealth (INDEPENDENT_AMBULATORY_CARE_PROVIDER_SITE_OTHER): Payer: Self-pay | Admitting: Gastroenterology

## 2022-10-31 ENCOUNTER — Encounter (INDEPENDENT_AMBULATORY_CARE_PROVIDER_SITE_OTHER): Payer: Self-pay | Admitting: Gastroenterology

## 2022-10-31 ENCOUNTER — Ambulatory Visit (INDEPENDENT_AMBULATORY_CARE_PROVIDER_SITE_OTHER): Payer: BC Managed Care – PPO | Admitting: Gastroenterology

## 2022-10-31 VITALS — BP 121/81 | HR 67 | Temp 98.1°F | Ht 66.0 in | Wt 175.3 lb

## 2022-10-31 DIAGNOSIS — R935 Abnormal findings on diagnostic imaging of other abdominal regions, including retroperitoneum: Secondary | ICD-10-CM | POA: Diagnosis not present

## 2022-10-31 DIAGNOSIS — K869 Disease of pancreas, unspecified: Secondary | ICD-10-CM

## 2022-10-31 MED ORDER — PEG 3350-KCL-NA BICARB-NACL 420 G PO SOLR: 4000.0000 mL | Freq: Once | ORAL | 0 refills | Status: AC

## 2022-10-31 NOTE — H&P (View-Only) (Signed)
[name], M.D. Gastroenterology & Hepatology [facility] [address] [place], [place] [address] Primary Care Physician: Simpson, Margaret E, MD [address] [place] [address]  Referring MD: PCP  Chief Complaint: Abnormal pancreas and CT scan  History of Present Illness: [name] is a 52 y.o. female with past medical history of hypertension and possible rheumatoid arthritis, who presents for evaluation of abnormal pancreas and CT scan.  Patient reports that on 10/20/2022 she had acute onset of suprapubic stabbing constant pain that was very severe. Pain did not radiate anywhere else. States a week before she had severe constipation, which was followed by diarrhea starting 2 days prior to her abdominal pain (this lasted for 2 days). The patient denies having any nausea, vomiting, fever, chills, hematochezia, melena, hematemesis, jaundice, pruritus or weight loss. Patient went to the ER at [facility] on 10/20/2022 after presenting new onset suprapubic pain radiating to her rectum.  The patient had very low potassium levels of 2.2, chloride was 97, calcium was 8.5, rest of the electrolytes were within normal limits, liver function tests were within normal limits, lipase was very mildly increased at 58.  CBC showed a WBC of 10.7, hemoglobin 11.9 and platelets 326.  UA was normal. A CT of the abdomen and pelvis without contrast showed mildly thickened terminal ileum without other associated inflammatory changes.  She had stable 3 and 5 mm hypodensities in the body of the pancreas, possibly related to lipomas, as well as borderline right lower quadrant mesenteric enlarged lymph nodes.  I reviewed these images.  Study was limited by the lack of IV contrast use, there was presence of a fair amount of stool throughout her colon and mildly thickened terminal ileum.  Pancreatic abnormalities are difficult to characterize, unclear if  there is gas in the middle portion of the pancreas.  States the abdominal pain resolved spontaneously after a day.  Patient reports she was prescribed oral potassium by her PCP - takes 80 mEq daily now. Has an appointmetn next week for a recheck. Has a history of hypokalemia for multiple years.  Last EGD:never Last Colonoscopy:never  FHx: neg for any gastrointestinal/liver disease, daughter thyroid cancer, father thyroid cancer  Social: neg smoking, alcohol or illicit drug use Surgical: tubal lligation and tubal removal.  Past Medical History: Past Medical History:  Diagnosis Date   Back pain    BV (bacterial vaginosis) 09/16/2013   Chest pain 2000   right    Contraceptive management 09/16/2013   Encephalocele (HCC) 1994   Enlarged thyroid    HSV-2 (herpes simplex virus 2) infection    Hypertension    Irregular intermenstrual bleeding 04/27/2015   Obesity    Preterm labor    Rotator cuff tear    Sleep disorder    Thyroid nodule    Urinary frequency 12/14/2014   Vaginal discharge 09/16/2013    Past Surgical History: Past Surgical History:  Procedure Laterality Date   BREAST BIOPSY  2007   btl  1994   DILITATION & CURRETTAGE/HYSTROSCOPY WITH NOVASURE ABLATION N/A 05/06/2016   Procedure: DILATATION & CURETTAGE/HYSTEROSCOPY WITH NOVASURE ABLATION;  Surgeon: John V Ferguson, MD;  Location: [facility];  Service: Gynecology;  Laterality: N/A;  length-5; width-2.6; power-72 ; time- 1 minute 42 seconds   LAPAROSCOPIC BILATERAL SALPINGECTOMY Bilateral 05/06/2016   Procedure: LAPAROSCOPIC BILATERAL SALPINGECTOMY;  Surgeon: John V Ferguson, MD;  Location: [facility];  Service: Gynecology;  Laterality: Bilateral;   RETAINED   PLACENTA REMOVAL     TUBAL LIGATION     04/2016   tubal ligation reversal  2001   WISDOM TOOTH EXTRACTION      Family History: Family History  Problem Relation Age of Onset   Anuerysm Mother    Stroke Mother    Cancer Father        thyroid    Hypertension  Father    Cancer Paternal Grandmother        breast   Seizures Maternal Grandmother     Social History: Social History   Tobacco Use  Smoking Status Never   Passive exposure: Never  Smokeless Tobacco Never   Social History   Substance and Sexual Activity  Alcohol Use No   Social History   Substance and Sexual Activity  Drug Use No    Allergies: Allergies  Allergen Reactions   Biaxin [Clarithromycin] Diarrhea, Nausea And Vomiting and Other (See Comments)    Blood in stool   Iodinated Contrast Media Shortness Of Breath    Pt states she had difficulty breathing after receiving iv contrast in 2013.    Depo-Medrol [Methylprednisolone] Itching    Generalized itching worse at injection site, no wheezing   Shellfish Allergy    Yellow Jacket Venom [Bee Venom] Swelling   Peanut-Containing Drug Products Rash    Medications: Current Outpatient Medications  Medication Sig Dispense Refill   cetirizine HCl (ZYRTEC) 1 MG/ML solution TAKE 1/2 TO 1 TEASPOON BY MOUTH DAILY AS , NEEDED, FOR ALLERGIES (OTC) 120 mL 3   diltiazem (CARDIZEM CD) 120 MG 24 hr capsule Take 1 capsule (120 mg total) by mouth daily. 90 capsule 2   hydrochlorothiazide (HYDRODIURIL) 25 MG tablet TAKE 1 TABLET (25 MG TOTAL) BY MOUTH DAILY. 90 tablet 3   metoprolol tartrate (LOPRESSOR) 25 MG tablet TAKE 1/2 TABLET BY MOUTH TWICE DAILY 90 tablet 0   potassium chloride SA (KLOR-CON M20) 20 MEQ tablet TAKE 2 TABLETS BY MOUTH TWICE A DAY 720 tablet 2   No current facility-administered medications for this visit.    Review of Systems: GENERAL: negative for malaise, night sweats HEENT: No changes in hearing or vision, no nose bleeds or other nasal problems. NECK: Negative for lumps, goiter, pain and significant neck swelling RESPIRATORY: Negative for cough, wheezing CARDIOVASCULAR: Negative for chest pain, leg swelling, palpitations, orthopnea GI: SEE HPI MUSCULOSKELETAL: Negative for joint pain or swelling, back  pain, and muscle pain. SKIN: Negative for lesions, rash PSYCH: Negative for sleep disturbance, mood disorder and recent psychosocial stressors. HEMATOLOGY Negative for prolonged bleeding, bruising easily, and swollen nodes. ENDOCRINE: Negative for cold or heat intolerance, polyuria, polydipsia and goiter. NEURO: negative for tremor, gait imbalance, syncope and seizures. The remainder of the review of systems is noncontributory.   Physical Exam: BP 121/81 (BP Location: Left Arm, Patient Position: Sitting, Cuff Size: Large)   Pulse 67   Temp 98.1 F (36.7 C) (Oral)   Ht 5' 6" (1.676 m)   Wt 175 lb 4.8 oz (79.5 kg)   BMI 28.29 kg/m  GENERAL: The patient is AO x3, in no acute distress. HEENT: Head is normocephalic and atraumatic. EOMI are intact. Mouth is well hydrated and without lesions. NECK: Supple. No masses LUNGS: Clear to auscultation. No presence of rhonchi/wheezing/rales. Adequate chest expansion HEART: RRR, normal s1 and s2. ABDOMEN: Soft, nontender, no guarding, no peritoneal signs, and nondistended. BS +. No masses. EXTREMITIES: Without any cyanosis, clubbing, rash, lesions or edema. NEUROLOGIC: AOx3, no focal motor deficit. SKIN: no   jaundice, no rashes   Imaging/Labs: as above  I personally reviewed and interpreted the available labs, imaging and endoscopic files.  Impression and Plan: [name] is a 52 y.o. female with past medical history of hypertension and possible rheumatoid arthritis, who presents for evaluation of abnormal pancreas and CT scan.  Patient had acute onset of abdominal pain and change in her bowel movement frequency, which resolved in 24 hours.  Is very possible this presentation was related to infectious ileitis that resolved on its own.  No further evaluation is warranted at this time, but she was found to have some abnormalities in her pancreas.  Unfortunately, these abnormalities are not adequately evaluated in a noncontrast study.  We will  evaluate this further with an MRCP with and without IV contrast.  -Schedule colonoscopy with terminal ileum evaluation - Schedule MRCP with and without IV contrast - Please have repeat BMP next week, continue oral potassium intake.  All questions were answered.      Sandra Levit Castaneda, MD Gastroenterology and Hepatology [facility]  

## 2022-10-31 NOTE — Patient Instructions (Addendum)
Schedule colonoscopy Schedule MRCP with and without IV contrast Please have repeat BMP next week, continue oral potassium intake.

## 2022-10-31 NOTE — Telephone Encounter (Signed)
Pt needing MRCP ordered. Per [facility] no PA needed. Will call Monday 11/03/22 to schedule.

## 2022-10-31 NOTE — Progress Notes (Signed)
[name], M.D. Gastroenterology & Hepatology [facility] [address] [place], [place] [address] Primary Care Physician: Kerri Perches, MD [address] [place] [address]  Referring MD: PCP  Chief Complaint: Abnormal pancreas and CT scan  History of Present Illness: [name] is a 52 y.o. female with past medical history of hypertension and possible rheumatoid arthritis, who presents for evaluation of abnormal pancreas and CT scan.  Patient reports that on 10/20/2022 she had acute onset of suprapubic stabbing constant pain that was very severe. Pain did not radiate anywhere else. States a week before she had severe constipation, which was followed by diarrhea starting 2 days prior to her abdominal pain (this lasted for 2 days). The patient denies having any nausea, vomiting, fever, chills, hematochezia, melena, hematemesis, jaundice, pruritus or weight loss. Patient went to the ER at [facility] on 10/20/2022 after presenting new onset suprapubic pain radiating to her rectum.  The patient had very low potassium levels of 2.2, chloride was 97, calcium was 8.5, rest of the electrolytes were within normal limits, liver function tests were within normal limits, lipase was very mildly increased at 58.  CBC showed a WBC of 10.7, hemoglobin 11.9 and platelets 326.  UA was normal. A CT of the abdomen and pelvis without contrast showed mildly thickened terminal ileum without other associated inflammatory changes.  She had stable 3 and 5 mm hypodensities in the body of the pancreas, possibly related to lipomas, as well as borderline right lower quadrant mesenteric enlarged lymph nodes.  I reviewed these images.  Study was limited by the lack of IV contrast use, there was presence of a fair amount of stool throughout her colon and mildly thickened terminal ileum.  Pancreatic abnormalities are difficult to characterize, unclear if  there is gas in the middle portion of the pancreas.  States the abdominal pain resolved spontaneously after a day.  Patient reports she was prescribed oral potassium by her PCP - takes 80 mEq daily now. Has an appointmetn next week for a recheck. Has a history of hypokalemia for multiple years.  Last UJW:JXBJY Last Colonoscopy:never  FHx: neg for any gastrointestinal/liver disease, daughter thyroid cancer, father thyroid cancer  Social: neg smoking, alcohol or illicit drug use Surgical: tubal lligation and tubal removal.  Past Medical History: Past Medical History:  Diagnosis Date   Back pain    BV (bacterial vaginosis) 09/16/2013   Chest pain 2000   right    Contraceptive management 09/16/2013   Encephalocele (HCC) 1994   Enlarged thyroid    HSV-2 (herpes simplex virus 2) infection    Hypertension    Irregular intermenstrual bleeding 04/27/2015   Obesity    Preterm labor    Rotator cuff tear    Sleep disorder    Thyroid nodule    Urinary frequency 12/14/2014   Vaginal discharge 09/16/2013    Past Surgical History: Past Surgical History:  Procedure Laterality Date   BREAST BIOPSY  2007   btl  1994   DILITATION & CURRETTAGE/HYSTROSCOPY WITH NOVASURE ABLATION N/A 05/06/2016   Procedure: DILATATION & CURETTAGE/HYSTEROSCOPY WITH NOVASURE ABLATION;  Surgeon: Tilda Burrow, MD;  Location: [facility];  Service: Gynecology;  Laterality: N/A;  length-5; width-2.6; power-72 ; time- 1 minute 42 seconds   LAPAROSCOPIC BILATERAL SALPINGECTOMY Bilateral 05/06/2016   Procedure: LAPAROSCOPIC BILATERAL SALPINGECTOMY;  Surgeon: Tilda Burrow, MD;  Location: [facility];  Service: Gynecology;  Laterality: Bilateral;   RETAINED  PLACENTA REMOVAL     TUBAL LIGATION     04/2016   tubal ligation reversal  2001   WISDOM TOOTH EXTRACTION      Family History: Family History  Problem Relation Age of Onset   Anuerysm Mother    Stroke Mother    Cancer Father        thyroid    Hypertension  Father    Cancer Paternal Grandmother        breast   Seizures Maternal Grandmother     Social History: Social History   Tobacco Use  Smoking Status Never   Passive exposure: Never  Smokeless Tobacco Never   Social History   Substance and Sexual Activity  Alcohol Use No   Social History   Substance and Sexual Activity  Drug Use No    Allergies: Allergies  Allergen Reactions   Biaxin [Clarithromycin] Diarrhea, Nausea And Vomiting and Other (See Comments)    Blood in stool   Iodinated Contrast Media Shortness Of Breath    Pt states she had difficulty breathing after receiving iv contrast in 2013.    Depo-Medrol [Methylprednisolone] Itching    Generalized itching worse at injection site, no wheezing   Shellfish Allergy    Yellow Jacket Venom [Bee Venom] Swelling   Peanut-Containing Drug Products Rash    Medications: Current Outpatient Medications  Medication Sig Dispense Refill   cetirizine HCl (ZYRTEC) 1 MG/ML solution TAKE 1/2 TO 1 TEASPOON BY MOUTH DAILY AS , NEEDED, FOR ALLERGIES (OTC) 120 mL 3   diltiazem (CARDIZEM CD) 120 MG 24 hr capsule Take 1 capsule (120 mg total) by mouth daily. 90 capsule 2   hydrochlorothiazide (HYDRODIURIL) 25 MG tablet TAKE 1 TABLET (25 MG TOTAL) BY MOUTH DAILY. 90 tablet 3   metoprolol tartrate (LOPRESSOR) 25 MG tablet TAKE 1/2 TABLET BY MOUTH TWICE DAILY 90 tablet 0   potassium chloride SA (KLOR-CON M20) 20 MEQ tablet TAKE 2 TABLETS BY MOUTH TWICE A DAY 720 tablet 2   No current facility-administered medications for this visit.    Review of Systems: GENERAL: negative for malaise, night sweats HEENT: No changes in hearing or vision, no nose bleeds or other nasal problems. NECK: Negative for lumps, goiter, pain and significant neck swelling RESPIRATORY: Negative for cough, wheezing CARDIOVASCULAR: Negative for chest pain, leg swelling, palpitations, orthopnea GI: SEE HPI MUSCULOSKELETAL: Negative for joint pain or swelling, back  pain, and muscle pain. SKIN: Negative for lesions, rash PSYCH: Negative for sleep disturbance, mood disorder and recent psychosocial stressors. HEMATOLOGY Negative for prolonged bleeding, bruising easily, and swollen nodes. ENDOCRINE: Negative for cold or heat intolerance, polyuria, polydipsia and goiter. NEURO: negative for tremor, gait imbalance, syncope and seizures. The remainder of the review of systems is noncontributory.   Physical Exam: BP 121/81 (BP Location: Left Arm, Patient Position: Sitting, Cuff Size: Large)   Pulse 67   Temp 98.1 F (36.7 C) (Oral)   Ht 5\' 6"  (1.676 m)   Wt 175 lb 4.8 oz (79.5 kg)   BMI 28.29 kg/m  GENERAL: The patient is AO x3, in no acute distress. HEENT: Head is normocephalic and atraumatic. EOMI are intact. Mouth is well hydrated and without lesions. NECK: Supple. No masses LUNGS: Clear to auscultation. No presence of rhonchi/wheezing/rales. Adequate chest expansion HEART: RRR, normal s1 and s2. ABDOMEN: Soft, nontender, no guarding, no peritoneal signs, and nondistended. BS +. No masses. EXTREMITIES: Without any cyanosis, clubbing, rash, lesions or edema. NEUROLOGIC: AOx3, no focal motor deficit. SKIN: no  jaundice, no rashes   Imaging/Labs: as above  I personally reviewed and interpreted the available labs, imaging and endoscopic files.  Impression and Plan: [name] is a 52 y.o. female with past medical history of hypertension and possible rheumatoid arthritis, who presents for evaluation of abnormal pancreas and CT scan.  Patient had acute onset of abdominal pain and change in her bowel movement frequency, which resolved in 24 hours.  Is very possible this presentation was related to infectious ileitis that resolved on its own.  No further evaluation is warranted at this time, but she was found to have some abnormalities in her pancreas.  Unfortunately, these abnormalities are not adequately evaluated in a noncontrast study.  We will  evaluate this further with an MRCP with and without IV contrast.  -Schedule colonoscopy with terminal ileum evaluation - Schedule MRCP with and without IV contrast - Please have repeat BMP next week, continue oral potassium intake.  All questions were answered.      Sandra Blazing, MD Gastroenterology and Hepatology [facility]

## 2022-11-03 NOTE — Telephone Encounter (Signed)
MRCP scheduled for May 22nd at 8 am. Pt needs to arrive at 7:45 am. NPO 4 hrs prior.   Pt has reaction listed to contrast media-per central scheduling pt will need the 13 hour prep. Please advise. Thank you

## 2022-11-03 NOTE — Telephone Encounter (Signed)
Spoke with central scheduling-allergy is for CT contrast not MRI contrast. Was instructed to send [name] a secure chat letting her know that pt does not have allergy to MRI contrast.Secure chat sent. Left message for pt to return call

## 2022-11-03 NOTE — Telephone Encounter (Signed)
Pt left voicemail returning call.  Returned call to patient. Pt verbalized understanding.

## 2022-11-03 NOTE — Telephone Encounter (Signed)
Please ask central scheduling about her listed allergy - she had allergy to the contrast used for CT scan (iodinated) but not to the MRI contrast (gadolinium). I asked the patient about this and she only reported allergy to the CT contrast.

## 2022-11-13 ENCOUNTER — Other Ambulatory Visit: Payer: Self-pay | Admitting: Family Medicine

## 2022-11-17 ENCOUNTER — Telehealth (INDEPENDENT_AMBULATORY_CARE_PROVIDER_SITE_OTHER): Payer: Self-pay | Admitting: Gastroenterology

## 2022-11-17 DIAGNOSIS — E876 Hypokalemia: Secondary | ICD-10-CM | POA: Diagnosis not present

## 2022-11-17 DIAGNOSIS — I10 Essential (primary) hypertension: Secondary | ICD-10-CM | POA: Diagnosis not present

## 2022-11-17 NOTE — Telephone Encounter (Signed)
Patient came by office this morning stated she had just had blood work done for Dr Harrie Foreman to know if the blood work she done would be the same as what she needs down for her colonoscopy - please advise - ph# [phone]

## 2022-11-18 LAB — BMP8+EGFR
BUN/Creatinine Ratio: 11 (ref 9–23)
BUN: 10 mg/dL (ref 6–24)
CO2: 20 mmol/L (ref 20–29)
Calcium: 9.1 mg/dL (ref 8.7–10.2)
Chloride: 105 mmol/L (ref 96–106)
Creatinine, Ser: 0.93 mg/dL (ref 0.57–1.00)
Glucose: 74 mg/dL (ref 70–99)
Potassium: 4.2 mmol/L (ref 3.5–5.2)
Sodium: 140 mmol/L (ref 134–144)
eGFR: 74 mL/min/{1.73_m2} (ref >59)

## 2022-11-18 NOTE — Telephone Encounter (Signed)
Pt contacted and informed that current labs from PCP will suffice.

## 2022-11-21 ENCOUNTER — Encounter (HOSPITAL_COMMUNITY)
Admission: RE | Disposition: A | Discharge status: Home / Self Care | Payer: Self-pay | Source: Home / Self Care | Attending: Gastroenterology

## 2022-11-21 ENCOUNTER — Other Ambulatory Visit: Payer: Self-pay

## 2022-11-21 ENCOUNTER — Ambulatory Visit (HOSPITAL_COMMUNITY): Payer: BC Managed Care – PPO | Admitting: Anesthesiology

## 2022-11-21 ENCOUNTER — Encounter (HOSPITAL_COMMUNITY): Payer: Self-pay | Admitting: Gastroenterology

## 2022-11-21 ENCOUNTER — Ambulatory Visit (HOSPITAL_COMMUNITY)
Admission: RE | Admit: 2022-11-21 | Discharge: 2022-11-21 | Disposition: A | Discharge status: Home / Self Care | Payer: BC Managed Care – PPO | Attending: Gastroenterology | Admitting: Gastroenterology

## 2022-11-21 DIAGNOSIS — R933 Abnormal findings on diagnostic imaging of other parts of digestive tract: Secondary | ICD-10-CM | POA: Diagnosis not present

## 2022-11-21 DIAGNOSIS — I10 Essential (primary) hypertension: Secondary | ICD-10-CM | POA: Insufficient documentation

## 2022-11-21 DIAGNOSIS — I82409 Acute embolism and thrombosis of unspecified deep veins of unspecified lower extremity: Secondary | ICD-10-CM | POA: Diagnosis not present

## 2022-11-21 DIAGNOSIS — K56699 Other intestinal obstruction unspecified as to partial versus complete obstruction: Secondary | ICD-10-CM | POA: Insufficient documentation

## 2022-11-21 DIAGNOSIS — K529 Noninfective gastroenteritis and colitis, unspecified: Secondary | ICD-10-CM | POA: Diagnosis not present

## 2022-11-21 DIAGNOSIS — R1030 Lower abdominal pain, unspecified: Secondary | ICD-10-CM

## 2022-11-21 DIAGNOSIS — Z86718 Personal history of other venous thrombosis and embolism: Secondary | ICD-10-CM | POA: Diagnosis not present

## 2022-11-21 DIAGNOSIS — Z1211 Encounter for screening for malignant neoplasm of colon: Secondary | ICD-10-CM

## 2022-11-21 HISTORY — PX: COLONOSCOPY WITH PROPOFOL: SHX5780

## 2022-11-21 HISTORY — PX: BIOPSY: SHX5522

## 2022-11-21 LAB — COMPREHENSIVE METABOLIC PANEL
ALT: 13 U/L (ref 0–44)
AST: 15 U/L (ref 15–41)
Albumin: 4.1 g/dL (ref 3.5–5.0)
Alkaline Phosphatase: 91 U/L (ref 38–126)
Anion gap: 9 (ref 5–15)
BUN: 7 mg/dL (ref 6–20)
CO2: 23 mmol/L (ref 22–32)
Calcium: 9 mg/dL (ref 8.9–10.3)
Chloride: 103 mmol/L (ref 98–111)
Creatinine, Ser: 1 mg/dL (ref 0.44–1.00)
GFR, Estimated: >60 mL/min (ref >60)
Glucose, Bld: 83 mg/dL (ref 70–99)
Potassium: 3.4 mmol/L — ABNORMAL LOW (ref 3.5–5.1)
Sodium: 135 mmol/L (ref 135–145)
Total Bilirubin: 0.9 mg/dL (ref 0.3–1.2)
Total Protein: 8 g/dL (ref 6.5–8.1)

## 2022-11-21 LAB — POCT PREGNANCY, URINE: Preg Test, Ur: NEGATIVE

## 2022-11-21 LAB — CBC
HCT: 42.8 % (ref 36.0–46.0)
Hemoglobin: 13.3 g/dL (ref 12.0–15.0)
MCH: 21.9 pg — ABNORMAL LOW (ref 26.0–34.0)
MCHC: 31.1 g/dL (ref 30.0–36.0)
MCV: 70.6 fL — ABNORMAL LOW (ref 80.0–100.0)
Platelets: 274 10*3/uL (ref 150–400)
RBC: 6.06 MIL/uL — ABNORMAL HIGH (ref 3.87–5.11)
RDW: 17.4 % — ABNORMAL HIGH (ref 11.5–15.5)
WBC: 10.8 10*3/uL — ABNORMAL HIGH (ref 4.0–10.5)
nRBC: 0 % (ref 0.0–0.2)

## 2022-11-21 LAB — C-REACTIVE PROTEIN: CRP: 0.6 mg/dL (ref <1.0)

## 2022-11-21 LAB — HEPATITIS B SURFACE ANTIGEN: Hepatitis B Surface Ag: NONREACTIVE

## 2022-11-21 LAB — HM COLONOSCOPY

## 2022-11-21 SURGERY — COLONOSCOPY WITH PROPOFOL
Anesthesia: General

## 2022-11-21 MED ORDER — PROPOFOL 10 MG/ML IV BOLUS
INTRAVENOUS | Status: DC | PRN
  Administered 2022-11-21: 60 mg via INTRAVENOUS

## 2022-11-21 MED ORDER — LACTATED RINGERS IV SOLN
INTRAVENOUS | Status: DC
  Administered 2022-11-21: 1000 mL via INTRAVENOUS

## 2022-11-21 MED ORDER — PROPOFOL 500 MG/50ML IV EMUL
INTRAVENOUS | Status: DC | PRN
  Administered 2022-11-21: 150 ug/kg/min via INTRAVENOUS

## 2022-11-21 NOTE — Discharge Instructions (Addendum)
You are being discharged to home.  Resume your previous diet.  We are waiting for your pathology results.  Your physician has recommended a repeat colonoscopy in 10 years for screening purposes.  Check CBC, CMP, hepatitis B s Ag, Quantiferon and CRP.

## 2022-11-21 NOTE — Op Note (Signed)
[facility] Patient Name: [name] Procedure Date: 11/21/2022 12:21 PM MRN: [id number] Date of Birth: Aug 22, 1970 Attending MD: Katrinka Blazing , , [id number] CSN: [id number] Age: 52 Admit Type: Outpatient Procedure:                Colonoscopy Indications:              Lower abdominal pain, Abnormal CT of the GI tract Providers:                Katrinka Blazing, Sheran Fava, Kristine L.                            Jessee Avers, Technician Referring MD:              Medicines:                Monitored Anesthesia Care Complications:            No immediate complications. Estimated Blood Loss:     Estimated blood loss: none. Procedure:                Pre-Anesthesia Assessment:                           - Prior to the procedure, a History and Physical                            was performed, and patient medications, allergies                            and sensitivities were reviewed. The patient's                            tolerance of previous anesthesia was reviewed.                           - The risks and benefits of the procedure and the                            sedation options and risks were discussed with the                            patient. All questions were answered and informed                            consent was obtained.                           - ASA Grade Assessment: II - A patient with mild                            systemic disease.                           After obtaining informed consent, the colonoscope                            was passed under direct vision.  Throughout the                            procedure, the patient's blood pressure, pulse, and                            oxygen saturations were monitored continuously. The                            [id number]-HQ190L ([id number]) scope was introduced through                            the anus and advanced to the the cecum, identified                            by appendiceal orifice and  ileocecal valve. The                            colonoscopy was performed without difficulty. The                            patient tolerated the procedure well. The quality                            of the bowel preparation was excellent. Scope In: 12:33:46 PM Scope Out: 1:12:45 PM Scope Withdrawal Time: 0 hours 27 minutes 6 seconds  Total Procedure Duration: 0 hours 38 minutes 59 seconds  Findings:      The perianal and digital rectal examinations were normal.      A benign-appearing, intrinsic severe stenosis measuring of unknown       length x 6 mm (inner diameter) was found at the ileocecal valve and was       non-traversed. This area was inspected in retroflexion. Multiple       attempts performed to pass the scope but given inflammation, this could       not be achieved.      Diffuse moderate inflammation characterized by congestion (edema),       erosions and erythema was found at the ileocecal valve. Biopsies were       taken with a cold forceps for histology.      The entire examined colon appeared normal. Impression:               - Stricture at the ileocecal valve.                           - Diffuse moderate inflammation was found at the                            ileocecal valve, rule out Crohn's disease. Biopsied.                           - The entire examined colon is normal. Moderate Sedation:      Per Anesthesia Care Recommendation:           - Discharge patient to home (ambulatory).                           -  Resume previous diet.                           - Await pathology results.                           - Repeat colonoscopy in 10 years for screening                            purposes.                           - Check CBC, CMP, hepatitis B s Ag, Quantiferon and                            CRP.                           - May consider CTE/MRE to determine stricture                            extension. Procedure Code(s):        --- Professional ---                            [phone], Colonoscopy, flexible; with biopsy, single                            or multiple Diagnosis Code(s):        --- Professional ---                           [phone], Other intestinal obstruction unspecified                            as to partial versus complete obstruction                           K52.9, Noninfective gastroenteritis and colitis,                            unspecified                           R10.30, Lower abdominal pain, unspecified                           R93.3, Abnormal findings on diagnostic imaging of                            other parts of digestive tract CPT copyright 2022 American [employer] Association. All rights reserved. The codes documented in this report are preliminary and upon coder review may  be revised to meet current compliance requirements. Katrinka Blazing, MD Katrinka Blazing,  11/21/2022 1:27:07 PM This report has been signed electronically. Number of Addenda: 0

## 2022-11-21 NOTE — Interval H&P Note (Signed)
History and Physical Interval Note:  11/21/2022 11:17 AM  [name]  has presented today for surgery, with the diagnosis of SCREENING.  The various methods of treatment have been discussed with the patient and family. After consideration of risks, benefits and other options for treatment, the patient has consented to  Procedure(s) with comments: COLONOSCOPY WITH PROPOFOL (N/A) - 1:00PM;ASA 1-2 as a surgical intervention.  The patient's history has been reviewed, patient examined, no change in status, stable for surgery.  I have reviewed the patient's chart and labs.  Questions were answered to the patient's satisfaction.     Katrinka Blazing Mayorga

## 2022-11-21 NOTE — Transfer of Care (Signed)
Immediate Anesthesia Transfer of Care Note  Patient: Sanantha Proffer  Procedure(s) Performed: COLONOSCOPY WITH PROPOFOL BIOPSY  Patient Location: Endoscopy Unit  Anesthesia Type:General  Level of Consciousness: awake, alert , and oriented  Airway & Oxygen Therapy: Patient Spontanous Breathing  Post-op Assessment: Report given to RN, Post -op Vital signs reviewed and stable, Patient moving all extremities X 4, and Patient able to stick tongue midline  Post vital signs: Reviewed  Last Vitals:  Vitals Value Taken Time  BP 99/57   Temp 97.7   Pulse 74   Resp 18   SpO2 97     Last Pain:  Vitals:   11/21/22 1230  TempSrc:   PainSc: 0-No pain      Patients Stated Pain Goal: 6 (11/21/22 1124)  Complications: No notable events documented.

## 2022-11-21 NOTE — Anesthesia Procedure Notes (Signed)
Procedure Name: General with mask airway Date/Time: 11/21/2022 12:30 PM  Performed by: Cy Blamer, CRNAPre-anesthesia Checklist: Patient identified, Emergency Drugs available, Suction available, Patient being monitored and Timeout performed Oxygen Delivery Method: Nasal cannula Placement Confirmation: positive ETCO2 Dental Injury: Teeth and Oropharynx as per pre-operative assessment

## 2022-11-21 NOTE — Anesthesia Preprocedure Evaluation (Addendum)
Anesthesia Evaluation  Patient identified by MRN, date of birth, ID band Patient awake    Reviewed: Allergy & Precautions, H&P , NPO status , Patient's Chart, lab work & pertinent test results  Airway Mallampati: II  TM Distance: >3 FB Neck ROM: Full   Comment: Cervical radiculopathy Dental no notable dental hx. (+) Dental Advisory Given, Teeth Intact   Pulmonary neg pulmonary ROS   Pulmonary exam normal breath sounds clear to auscultation       Cardiovascular hypertension, Pt. on medications + DVT  Normal cardiovascular exam Rhythm:Regular Rate:Normal     Neuro/Psych  PSYCHIATRIC DISORDERS       Neuromuscular disease    GI/Hepatic negative GI ROS, Neg liver ROS,,,  Endo/Other  negative endocrine ROS    Renal/GU negative Renal ROS  negative genitourinary   Musculoskeletal negative musculoskeletal ROS (+)    Abdominal   Peds negative pediatric ROS (+)  Hematology negative hematology ROS (+)   Anesthesia Other Findings   Reproductive/Obstetrics negative OB ROS                             Anesthesia Physical Anesthesia Plan  ASA: 2  Anesthesia Plan: General   Post-op Pain Management: Minimal or no pain anticipated   Induction: Intravenous  PONV Risk Score and Plan: 1 and Propofol infusion  Airway Management Planned: Nasal Cannula and Natural Airway  Additional Equipment:   Intra-op Plan:   Post-operative Plan:   Informed Consent: I have reviewed the patients History and Physical, chart, labs and discussed the procedure including the risks, benefits and alternatives for the proposed anesthesia with the patient or authorized representative who has indicated his/her understanding and acceptance.     Dental advisory given  Plan Discussed with: CRNA and Surgeon  Anesthesia Plan Comments:         Anesthesia Quick Evaluation

## 2022-11-21 NOTE — Anesthesia Postprocedure Evaluation (Signed)
Anesthesia Post Note  Patient: [name]  Procedure(s) Performed: COLONOSCOPY WITH PROPOFOL BIOPSY  Patient location during evaluation: Phase II Anesthesia Type: General Level of consciousness: awake and alert and oriented Pain management: pain level controlled Vital Signs Assessment: post-procedure vital signs reviewed and stable Respiratory status: spontaneous breathing, nonlabored ventilation and respiratory function stable Cardiovascular status: blood pressure returned to baseline and stable Postop Assessment: no apparent nausea or vomiting Anesthetic complications: no  No notable events documented.   Last Vitals:  Vitals:   11/21/22 1319 11/21/22 1322  BP: (!) 99/57 101/80  Pulse: 71   Resp: 15 18  Temp: 36.5 C   SpO2: 98% 99%    Last Pain:  Vitals:   11/21/22 1322  TempSrc:   PainSc: 0-No pain                 Zaineb Nowaczyk C Pascha Fogal

## 2022-11-24 ENCOUNTER — Encounter (INDEPENDENT_AMBULATORY_CARE_PROVIDER_SITE_OTHER): Payer: Self-pay | Admitting: *Deleted

## 2022-11-24 LAB — SURGICAL PATHOLOGY

## 2022-11-25 ENCOUNTER — Encounter (INDEPENDENT_AMBULATORY_CARE_PROVIDER_SITE_OTHER): Payer: Self-pay | Admitting: Gastroenterology

## 2022-11-25 ENCOUNTER — Encounter: Payer: Self-pay | Admitting: Family Medicine

## 2022-11-25 LAB — QUANTIFERON-TB GOLD PLUS: QuantiFERON-TB Gold Plus: NEGATIVE

## 2022-11-25 LAB — QUANTIFERON-TB GOLD PLUS (RQFGPL)
QuantiFERON Mitogen Value: 7.33 IU/mL
QuantiFERON Nil Value: 0.03 IU/mL
QuantiFERON TB1 Ag Value: 0.01 IU/mL
QuantiFERON TB2 Ag Value: 0.02 IU/mL

## 2022-11-26 ENCOUNTER — Other Ambulatory Visit (INDEPENDENT_AMBULATORY_CARE_PROVIDER_SITE_OTHER): Payer: Self-pay | Admitting: Gastroenterology

## 2022-11-26 ENCOUNTER — Ambulatory Visit (HOSPITAL_COMMUNITY)
Admission: RE | Admit: 2022-11-26 | Discharge: 2022-11-26 | Disposition: A | Discharge status: Home / Self Care | Payer: BC Managed Care – PPO | Source: Ambulatory Visit | Attending: Gastroenterology | Admitting: Gastroenterology

## 2022-11-26 DIAGNOSIS — K869 Disease of pancreas, unspecified: Secondary | ICD-10-CM

## 2022-11-26 DIAGNOSIS — R935 Abnormal findings on diagnostic imaging of other abdominal regions, including retroperitoneum: Secondary | ICD-10-CM

## 2022-11-26 DIAGNOSIS — K862 Cyst of pancreas: Secondary | ICD-10-CM | POA: Diagnosis not present

## 2022-11-26 DIAGNOSIS — K50919 Crohn's disease, unspecified, with unspecified complications: Secondary | ICD-10-CM

## 2022-11-26 MED ORDER — BUDESONIDE 3 MG PO CPEP
ORAL_CAPSULE | ORAL | 0 refills | Status: DC
Start: 2022-11-26 — End: 2022-12-10

## 2022-11-26 MED ORDER — GADOBUTROL 1 MMOL/ML IV SOLN
7.0000 mL | Freq: Once | INTRAVENOUS | Status: AC | PRN
  Administered 2022-11-26: 7 mL via INTRAVENOUS

## 2022-12-02 ENCOUNTER — Encounter (HOSPITAL_COMMUNITY): Payer: Self-pay | Admitting: Gastroenterology

## 2022-12-04 DIAGNOSIS — Q828 Other specified congenital malformations of skin: Secondary | ICD-10-CM | POA: Diagnosis not present

## 2022-12-04 DIAGNOSIS — M79672 Pain in left foot: Secondary | ICD-10-CM | POA: Diagnosis not present

## 2022-12-04 DIAGNOSIS — L110 Acquired keratosis follicularis: Secondary | ICD-10-CM | POA: Diagnosis not present

## 2022-12-04 DIAGNOSIS — M79671 Pain in right foot: Secondary | ICD-10-CM | POA: Diagnosis not present

## 2022-12-04 DIAGNOSIS — L565 Disseminated superficial actinic porokeratosis (DSAP): Secondary | ICD-10-CM | POA: Diagnosis not present

## 2022-12-10 ENCOUNTER — Other Ambulatory Visit (INDEPENDENT_AMBULATORY_CARE_PROVIDER_SITE_OTHER): Payer: Self-pay | Admitting: Gastroenterology

## 2022-12-10 DIAGNOSIS — K50919 Crohn's disease, unspecified, with unspecified complications: Secondary | ICD-10-CM

## 2022-12-10 NOTE — Telephone Encounter (Signed)
Last seen 10/31/22

## 2022-12-10 NOTE — Telephone Encounter (Signed)
Pt has not picked up yet. Pharmacy asking for quantity to be changed.

## 2022-12-17 ENCOUNTER — Telehealth: Payer: Self-pay | Admitting: Family Medicine

## 2022-12-17 NOTE — Telephone Encounter (Signed)
Patient called in wants a call back  Need to have vaccines for school / work   Hep B  Varicella  MMR  Tetanus   Wants to know if she can have all done at once and if they will make her sleepy while driving. Wants a call back in regard.

## 2022-12-18 ENCOUNTER — Ambulatory Visit (INDEPENDENT_AMBULATORY_CARE_PROVIDER_SITE_OTHER): Payer: BC Managed Care – PPO

## 2022-12-18 ENCOUNTER — Telehealth: Payer: Self-pay | Admitting: Family Medicine

## 2022-12-18 DIAGNOSIS — Z23 Encounter for immunization: Secondary | ICD-10-CM | POA: Diagnosis not present

## 2022-12-18 NOTE — Telephone Encounter (Signed)
Requesting a call back  Wants to know if she can do the titer for MMR and Varicella

## 2022-12-18 NOTE — Telephone Encounter (Signed)
Patient aware.

## 2022-12-19 ENCOUNTER — Encounter: Payer: Self-pay | Admitting: Family Medicine

## 2022-12-19 NOTE — Telephone Encounter (Signed)
Patient called to follow up on request; stated the deadline is Monday, 12/22/22.

## 2022-12-19 NOTE — Telephone Encounter (Signed)
Patient aware she made an appt with the health dept for Monday at 10:00

## 2022-12-19 NOTE — Telephone Encounter (Signed)
See other msg sent to Dr Lodema Hong

## 2022-12-30 ENCOUNTER — Telehealth: Payer: Self-pay | Admitting: Family Medicine

## 2022-12-30 NOTE — Telephone Encounter (Signed)
error 

## 2022-12-31 ENCOUNTER — Ambulatory Visit (INDEPENDENT_AMBULATORY_CARE_PROVIDER_SITE_OTHER): Payer: BC Managed Care – PPO | Admitting: Gastroenterology

## 2023-01-13 ENCOUNTER — Telehealth: Payer: Self-pay | Admitting: Family Medicine

## 2023-01-13 NOTE — Telephone Encounter (Signed)
Pt called in wants to know when she needs to get her 2nd hep b vaccine for school. Wants a call back.

## 2023-01-13 NOTE — Telephone Encounter (Signed)
Aware the next is 4 weeks after the first

## 2023-01-19 ENCOUNTER — Telehealth: Payer: Self-pay

## 2023-01-19 NOTE — Telephone Encounter (Signed)
Patient called about long-term disability paperwork. Patient states they [employer] is requesting lab work, lab work attached and faxed to [employer]. Patient made aware.

## 2023-01-27 ENCOUNTER — Ambulatory Visit: Payer: BC Managed Care – PPO | Admitting: Family Medicine

## 2023-01-29 ENCOUNTER — Encounter: Payer: Self-pay | Admitting: Family Medicine

## 2023-01-29 NOTE — Progress Notes (Deleted)
Office Visit Note  Patient: [name]             Date of Birth: 25-Sep-1970           MRN: [id number]             PCP: Kerri Perches, MD Referring: Kerri Perches, MD Visit Date: 02/12/2023 Occupation: @GUAROCC @  Subjective:    History of Present Illness: [name] is a 52 y.o. female with history of seropositive rheumatoid arthritis and osteoarthritis.  Patient is currently taking any immunosuppressive agents.     Activities of Daily Living:  Patient reports morning stiffness for *** {minute/hour:19697}.   Patient {ACTIONS;DENIES/REPORTS:21021675::"Denies"} nocturnal pain.  Difficulty dressing/grooming: {ACTIONS;DENIES/REPORTS:21021675::"Denies"} Difficulty climbing stairs: {ACTIONS;DENIES/REPORTS:21021675::"Denies"} Difficulty getting out of chair: {ACTIONS;DENIES/REPORTS:21021675::"Denies"} Difficulty using hands for taps, buttons, cutlery, and/or writing: {ACTIONS;DENIES/REPORTS:21021675::"Denies"}  No Rheumatology ROS completed.   PMFS History:  Patient Active Problem List   Diagnosis Date Noted  . Abnormal CT of the abdomen 10/31/2022  . Pancreatic lesion 10/31/2022  . Colon cancer screening 10/27/2022  . Encounter for screening colonoscopy 10/27/2022  . Torn rotator cuff 08/31/2022  . RBC microcytosis 07/25/2022  . Leukocytosis 07/25/2022  . Chronic right shoulder pain 07/21/2022  . Generalized pain 05/05/2022  . Elevated rheumatoid factor 05/05/2022  . Adjustment reaction with anxiety and depression 04/03/2022  . Screen for STD (sexually transmitted disease) 02/05/2022  . Acute low back pain without sciatica 11/28/2021  . Cervical radiculopathy 11/28/2021  . Benign skin mole 03/07/2020  . Callus of foot 03/07/2020  . Chronic superficial venous thrombosis of lower extremity 06/05/2019  . Allergic reaction 12/07/2018  . Multiple allergies 11/28/2015  . Prediabetes 08/01/2012  . Overweight (BMI 25.0-29.9) 08/01/2012  . Vitamin D deficiency  07/28/2012  . Abdominal pain 08/25/2011  . Allergic rhinitis 10/16/2009  . Neck pain 03/17/2009  . HYPOKALEMIA 03/14/2008  . THYROID NODULE 10/26/2007  . Essential hypertension 10/26/2007  . Back pain with radiation 10/26/2007    Past Medical History:  Diagnosis Date  . Back pain   . BV (bacterial vaginosis) 09/16/2013  . Chest pain 2000   right   . Contraceptive management 09/16/2013  . Encephalocele (HCC) 1994  . Enlarged thyroid   . HSV-2 (herpes simplex virus 2) infection   . Hypertension   . Irregular intermenstrual bleeding 04/27/2015  . Obesity   . Preterm labor   . Rotator cuff tear   . Sleep disorder   . Thyroid nodule   . Urinary frequency 12/14/2014  . Vaginal discharge 09/16/2013    Family History  Problem Relation Age of Onset  . Anuerysm Mother   . Stroke Mother   . Cancer Father        thyroid   . Hypertension Father   . Cancer Paternal Grandmother        breast  . Seizures Maternal Grandmother    Past Surgical History:  Procedure Laterality Date  . BIOPSY  11/21/2022   Procedure: BIOPSY;  Surgeon: Marguerita Merles, Reuel Boom, MD;  Location: [facility];  Service: Gastroenterology;;  . BREAST BIOPSY  2007  . btl  1994  . COLONOSCOPY WITH PROPOFOL N/A 11/21/2022   Procedure: COLONOSCOPY WITH PROPOFOL;  Surgeon: Dolores Frame, MD;  Location: [facility];  Service: Gastroenterology;  Laterality: N/A;  1:00PM;ASA 1-2  . DILITATION & CURRETTAGE/HYSTROSCOPY WITH NOVASURE ABLATION N/A 05/06/2016   Procedure: DILATATION & CURETTAGE/HYSTEROSCOPY WITH NOVASURE ABLATION;  Surgeon: Tilda Burrow, MD;  Location: [facility];  Service: Gynecology;  Laterality: N/A;  length-5; width-2.6; power-72 ; time- 1 minute 42 seconds  . LAPAROSCOPIC BILATERAL SALPINGECTOMY Bilateral 05/06/2016   Procedure: LAPAROSCOPIC BILATERAL SALPINGECTOMY;  Surgeon: Tilda Burrow, MD;  Location: [facility];  Service: Gynecology;  Laterality: Bilateral;  . RETAINED PLACENTA REMOVAL     . TUBAL LIGATION     04/2016  . tubal ligation reversal  2001  . WISDOM TOOTH EXTRACTION     Social History   Social History Narrative  . Not on file   Immunization History  Administered Date(s) Administered  . Hepb-cpg 12/18/2022  . Influenza,inj,Quad PF,6+ Mos 08/31/2014  . PFIZER(Purple Top)SARS-COV-2 Vaccination 02/18/2020  . Td 12/18/2010  . Tdap 07/07/2002, 12/18/2022     Objective: Vital Signs: There were no vitals taken for this visit.   Physical Exam Vitals and nursing note reviewed.  Constitutional:      Appearance: She is well-developed.  HENT:     Head: Normocephalic and atraumatic.  Eyes:     Conjunctiva/sclera: Conjunctivae normal.  Cardiovascular:     Rate and Rhythm: Normal rate and regular rhythm.     Heart sounds: Normal heart sounds.  Pulmonary:     Effort: Pulmonary effort is normal.     Breath sounds: Normal breath sounds.  Abdominal:     General: Bowel sounds are normal.     Palpations: Abdomen is soft.  Musculoskeletal:     Cervical back: Normal range of motion.  Lymphadenopathy:     Cervical: No cervical adenopathy.  Skin:    General: Skin is warm and dry.     Capillary Refill: Capillary refill takes less than 2 seconds.  Neurological:     Mental Status: She is alert and oriented to person, place, and time.  Psychiatric:        Behavior: Behavior normal.     Musculoskeletal Exam: ***  CDAI Exam: CDAI Score: -- Patient Global: --; Provider Global: -- Swollen: --; Tender: -- Joint Exam 02/12/2023   No joint exam has been documented for this visit   There is currently no information documented on the homunculus. Go to the Rheumatology activity and complete the homunculus joint exam.  Investigation: No additional findings.  Imaging: No results found.  Recent Labs: Lab Results  Component Value Date   WBC 10.8 (H) 11/21/2022   HGB 13.3 11/21/2022   PLT 274 11/21/2022   NA 135 11/21/2022   K 3.4 (L) 11/21/2022   CL 103  11/21/2022   CO2 23 11/21/2022   GLUCOSE 83 11/21/2022   BUN 7 11/21/2022   CREATININE 1.00 11/21/2022   BILITOT 0.9 11/21/2022   ALKPHOS 91 11/21/2022   AST 15 11/21/2022   ALT 13 11/21/2022   PROT 8.0 11/21/2022   ALBUMIN 4.1 11/21/2022   CALCIUM 9.0 11/21/2022   GFRAA 86 12/27/2019   QFTBGOLDPLUS Negative 11/21/2022    Speciality Comments: [facility] Eye exam 07/23/2022 Dr. Desiree Lucy Optometrist normal f/u 12 month  Procedures:  No procedures performed Allergies: Biaxin [clarithromycin], Iodinated contrast media, Depo-medrol [methylprednisolone], Shellfish allergy, Yellow jacket venom [bee venom], and Peanut-containing drug products   Assessment / Plan:     Visit Diagnoses: No diagnosis found.  Orders: No orders of the defined types were placed in this encounter.  No orders of the defined types were placed in this encounter.   Face-to-face time spent with patient was *** minutes. Greater than 50% of time was spent in counseling and coordination of care.  Follow-Up Instructions: No follow-ups on file.  Ellen Henri, CMA  Note - This record has been created using Animal nutritionist.  Chart creation errors have been sought, but may not always  have been located. Such creation errors do not reflect on  the standard of medical care.

## 2023-02-01 ENCOUNTER — Other Ambulatory Visit: Payer: Self-pay | Admitting: Family Medicine

## 2023-02-02 ENCOUNTER — Ambulatory Visit (INDEPENDENT_AMBULATORY_CARE_PROVIDER_SITE_OTHER): Payer: BC Managed Care – PPO

## 2023-02-02 DIAGNOSIS — Z23 Encounter for immunization: Secondary | ICD-10-CM

## 2023-02-12 ENCOUNTER — Ambulatory Visit: Payer: BC Managed Care – PPO | Admitting: Physician Assistant

## 2023-02-12 DIAGNOSIS — M5412 Radiculopathy, cervical region: Secondary | ICD-10-CM

## 2023-02-12 DIAGNOSIS — M7918 Myalgia, other site: Secondary | ICD-10-CM

## 2023-02-12 DIAGNOSIS — M75121 Complete rotator cuff tear or rupture of right shoulder, not specified as traumatic: Secondary | ICD-10-CM

## 2023-02-12 DIAGNOSIS — E559 Vitamin D deficiency, unspecified: Secondary | ICD-10-CM

## 2023-02-12 DIAGNOSIS — M0579 Rheumatoid arthritis with rheumatoid factor of multiple sites without organ or systems involvement: Secondary | ICD-10-CM

## 2023-02-12 DIAGNOSIS — M2242 Chondromalacia patellae, left knee: Secondary | ICD-10-CM

## 2023-02-12 DIAGNOSIS — I10 Essential (primary) hypertension: Secondary | ICD-10-CM

## 2023-02-12 DIAGNOSIS — M19041 Primary osteoarthritis, right hand: Secondary | ICD-10-CM

## 2023-02-12 DIAGNOSIS — M19071 Primary osteoarthritis, right ankle and foot: Secondary | ICD-10-CM

## 2023-02-12 DIAGNOSIS — Z79899 Other long term (current) drug therapy: Secondary | ICD-10-CM

## 2023-02-12 DIAGNOSIS — F4323 Adjustment disorder with mixed anxiety and depressed mood: Secondary | ICD-10-CM

## 2023-02-12 DIAGNOSIS — R7303 Prediabetes: Secondary | ICD-10-CM

## 2023-02-14 ENCOUNTER — Other Ambulatory Visit: Payer: Self-pay | Admitting: Family Medicine

## 2023-02-17 ENCOUNTER — Inpatient Hospital Stay: Payer: BC Managed Care – PPO | Attending: Family Medicine

## 2023-02-18 ENCOUNTER — Other Ambulatory Visit: Payer: Self-pay | Admitting: Adult Health

## 2023-02-18 ENCOUNTER — Telehealth: Payer: Self-pay | Admitting: *Deleted

## 2023-02-18 MED ORDER — METRONIDAZOLE 500 MG PO TABS: 500.0000 mg | ORAL_TABLET | Freq: Two times a day (BID) | ORAL | 0 refills | Status: DC

## 2023-02-18 MED ORDER — FLUCONAZOLE 150 MG PO TABS: ORAL_TABLET | ORAL | 1 refills | Status: DC

## 2023-02-18 NOTE — Telephone Encounter (Signed)
Pt has noticed a slight discharge with odor. Pain in low back, on left side. Pt states it's usually BV when she has these symptoms. Can you send in something? Thanks! JSY

## 2023-02-18 NOTE — Progress Notes (Signed)
Rx diflucan.  

## 2023-02-18 NOTE — Telephone Encounter (Signed)
Rx sent in for flagyl

## 2023-02-24 ENCOUNTER — Inpatient Hospital Stay: Payer: BC Managed Care – PPO | Admitting: Physician Assistant

## 2023-03-12 ENCOUNTER — Emergency Department (HOSPITAL_COMMUNITY)
Admission: EM | Admit: 2023-03-12 | Discharge: 2023-03-12 | Disposition: A | Discharge status: Home / Self Care | Payer: BC Managed Care – PPO | Attending: Emergency Medicine | Admitting: Emergency Medicine

## 2023-03-12 DIAGNOSIS — M545 Low back pain, unspecified: Secondary | ICD-10-CM | POA: Insufficient documentation

## 2023-03-12 DIAGNOSIS — G8929 Other chronic pain: Secondary | ICD-10-CM | POA: Insufficient documentation

## 2023-03-12 DIAGNOSIS — Z91010 Allergy to peanuts: Secondary | ICD-10-CM | POA: Insufficient documentation

## 2023-03-12 MED ORDER — GABAPENTIN 100 MG PO CAPS: 100.0000 mg | ORAL_CAPSULE | Freq: Three times a day (TID) | ORAL | 0 refills | Status: DC

## 2023-03-12 NOTE — ED Provider Notes (Signed)
[facility] AT [facility] Provider Note   CSN: [id number] Arrival date & time: 03/12/23  0272     History  Chief Complaint  Patient presents with   Back Pain    [name] is a 52 y.o. female with past medical history of RA, Crohn's disease presents for evaluation of bilateral lower back pain that has been worsening over the past 3 weeks.  She reports that she has experienced this pain and paresthesia before "months ago" with no obvious etiology that resolved on its own.  She reports that she had an MRI in May for workup for similar symptoms which was negative for mass and spine pathology.  She has tried heating/cooling pads and Tylenol/ibuprofen without much relief to pain. She denies injury or changes in physical lifestyle.   Back Pain      Home Medications Prior to Admission medications   Medication Sig Start Date End Date Taking? Authorizing Provider  fluconazole (DIFLUCAN) 150 MG tablet Take 1 now and 1 in 3 days 02/18/23   Cyril Mourning A, NP  gabapentin (NEURONTIN) 100 MG capsule Take 1 capsule (100 mg total) by mouth 3 (three) times daily. 03/12/23 04/11/23 Yes Edyth Gunnels, Cay Kath E, PA  budesonide (ENTOCORT EC) 3 MG 24 hr capsule TAKE 3 CAPSULES BY MOUTH DAILY FOR 28 DAYS, THEN 2 CAPS DAILY X28 DAYS, THEN 1 CAP DAILY X28 DAYS. 12/10/22   Carlan, Chelsea L, NP  cetirizine HCl (ZYRTEC) 1 MG/ML solution TAKE 1/2 TO 1 TEASPOON BY MOUTH DAILY AS , NEEDED, FOR ALLERGIES (OTC) 05/07/21   Anabel Halon, MD  diltiazem (CARDIZEM CD) 120 MG 24 hr capsule TAKE ONE CAPSULE BY MOUTH ONCE DAILY 02/02/23   Kerri Perches, MD  hydrochlorothiazide (HYDRODIURIL) 25 MG tablet TAKE 1 TABLET (25 MG TOTAL) BY MOUTH DAILY. 07/08/22   Kerri Perches, MD  metoprolol tartrate (LOPRESSOR) 25 MG tablet TAKE 1/2 TABLET BY MOUTH TWICE DAILY 02/16/23   Kerri Perches, MD  metroNIDAZOLE (FLAGYL) 500 MG tablet Take 1 tablet (500 mg total) by mouth 2 (two) times daily.  02/18/23   Adline Potter, NP  potassium chloride SA (KLOR-CON M20) 20 MEQ tablet TAKE 2 TABLETS BY MOUTH TWICE A DAY 06/24/22   Kerri Perches, MD      Allergies    Biaxin [clarithromycin], Iodinated contrast media, Depo-medrol [methylprednisolone], Shellfish allergy, Yellow jacket venom [bee venom], and Peanut-containing drug products    Review of Systems   Review of Systems  Musculoskeletal:  Positive for back pain.    Physical Exam Updated Vital Signs BP (!) 163/98 (BP Location: Left Arm)   Pulse 64   Temp 97.8 F (36.6 C) (Oral)   Resp 18   Ht 5\' 6"  (1.676 m)   Wt 78.9 kg   SpO2 97%   BMI 28.08 kg/m  Physical Exam Vitals and nursing note reviewed.  Constitutional:      General: She is not in acute distress.    Appearance: Normal appearance.  HENT:     Head: Normocephalic and atraumatic.  Eyes:     General: No scleral icterus.    Conjunctiva/sclera: Conjunctivae normal.  Cardiovascular:     Rate and Rhythm: Normal rate.     Pulses: Normal pulses.  Pulmonary:     Effort: Pulmonary effort is normal. No respiratory distress.     Breath sounds: Normal breath sounds.  Abdominal:     General: There is no distension.     Palpations:  Abdomen is soft.     Tenderness: There is no abdominal tenderness. There is no rebound.  Musculoskeletal:        General: Normal range of motion.     Comments: Tenderness to musculature of lower back bilaterally  Skin:    General: Skin is warm.     Capillary Refill: Capillary refill takes less than 2 seconds.     Coloration: Skin is not jaundiced or pale.  Neurological:     Mental Status: She is alert. Mental status is at baseline.     ED Results / Procedures / Treatments   Labs (all labs ordered are listed, but only abnormal results are displayed) Labs Reviewed - No data to display  EKG None  Radiology No results found.  Procedures Procedures    Medications Ordered in ED Medications - No data to display  ED  Course/ Medical Decision Making/ A&P                                 Medical Decision Making  Upon assessment, patient is resting comfortably in bed.  She was able to ambulate without difficulty from waiting room to room.  She reports that she has seen a neurologist in the past for the same symptoms who ordered an MRI of the spine and nerve conduction study but the workup did not explain her symptoms.  She denies urinary symptoms including difficulty urination and saddle anesthesia making cauda equina and UTI unlikely as etiology of symptoms.  Due to chronic symptoms and no reported recent injury, imaging not required for acute pathology. Pulses, motor, and sensation of LE are intact and equal bilaterally.  As this is a chronic and reoccurring problem that has had follow-up in the past, I do not believe imaging nor lab workup is required for her symptoms acutely.  Dr. Hyacinth Meeker independently assessed patient and agrees with treatment plan. Patient is stable for discharge. Treatment and disposition discussed with patient who expresses understanding and agrees with plan.  Prescribed gabapentin for nerve like pain. Return precautions to include but not limited to difficulty urination, paresthesia of pelvic region, worsening symptoms provided to patient.  Discussed follow-up with PCP for further workup of chronic symptoms, routine health maintenance, and medication management.         Final Clinical Impression(s) / ED Diagnoses Final diagnoses:  Chronic bilateral low back pain, unspecified whether sciatica present    Rx / DC Orders ED Discharge Orders          Ordered    gabapentin (NEURONTIN) 100 MG capsule  3 times daily        03/12/23 1021              Judithann Sheen, PA 03/12/23 1035    Eber Hong, MD 03/15/23 1224

## 2023-03-12 NOTE — Discharge Instructions (Signed)
Thank you for letting us handle your care.  Please follow-up with PCP regarding management of symptoms, routine medical maintenance, and medication management

## 2023-03-12 NOTE — ED Triage Notes (Signed)
CC of Lower back pain, that has been hurting for the past 2 weeks.  Noticed left leg numbness, and having difficulty walking No sure where the onset started.

## 2023-03-22 ENCOUNTER — Telehealth: Payer: Self-pay | Admitting: Family Medicine

## 2023-03-22 NOTE — Telephone Encounter (Signed)
Pls call pt and let her know that the ED Physician reached out to me asking that I ensure she has neurology follow up from recent ED visit.on 09/05 Regarding symptoms of low back pain  radiaiting down legs. Pls ask her to provide name of Neurologiost she has seen in the past or would like to see so that the referral can be done I will enter referral  Thanks

## 2023-03-27 ENCOUNTER — Ambulatory Visit: Payer: Self-pay | Admitting: Adult Health

## 2023-04-06 ENCOUNTER — Telehealth: Payer: Self-pay

## 2023-04-06 NOTE — Telephone Encounter (Signed)
Transition Care Management Unsuccessful Follow-up Telephone Call  Date of discharge and from where:  03/12/2023 [facility]  Attempts:  1st Attempt  Reason for unsuccessful TCM follow-up call:  Left voice message  Cadden Elizondo [facility]  [facility], [facility] Resource Care Guide Direct Dial: [phone]  Website: [email or web address]

## 2023-04-07 ENCOUNTER — Telehealth: Payer: Self-pay

## 2023-04-07 NOTE — Telephone Encounter (Signed)
Transition Care Management Unsuccessful Follow-up Telephone Call  Date of discharge and from where:  03/12/2023 [facility]  Attempts:  2nd Attempt  Reason for unsuccessful TCM follow-up call:  Left voice message  Beauford Lando [facility]  [facility], [facility] Resource Care Guide Direct Dial: [phone]  Website: [email or web address]

## 2023-05-25 ENCOUNTER — Ambulatory Visit: Payer: Self-pay | Admitting: Family Medicine

## 2023-05-25 NOTE — Telephone Encounter (Signed)
  Chief Complaint: cough Symptoms: cough Frequency: x4 days Pertinent Negatives: Patient denies fever, denies SOB Disposition: [] ED /[] Urgent Care (no appt availability in office) / [] Appointment(In office/virtual)/ []  [facility]/ [x] Home Care/ [] Refused Recommended Disposition /[] [facility]/ []  Follow-up with PCP Additional Notes: Pt calling with productive cough x4 days, using OTC meds and various home care.  Feels like she has a sinus infection, has had them in the past and this feels similarly. Denies fevers. + Yellow/green sputum. Denies CP. Added to appt wait list as no appts until Dec, advised to call back if increase of s/s. Pt advised on home care-increase fluids, and OTC medications. Pt agreeable.  Copied from CRM [id number]. Topic: Clinical - Medical Advice >> May 25, 2023  4:59 PM Raven B wrote: Reason for CRM: PT wants a prescription called into her pharmacy due to sinus infection. Call back # [phone] Reason for Disposition  [1] Nasal discharge AND [2] present > 10 days  Answer Assessment - Initial Assessment Questions 1. ONSET: "When did the cough begin?"      4 days 2. SEVERITY: "How bad is the cough today?"      mild 3. SPUTUM: "Describe the color of your sputum" (none, dry cough; clear, white, yellow, green)     Yellow and green 4. HEMOPTYSIS: "Are you coughing up any blood?" If so ask: "How much?" (flecks, streaks, tablespoons, etc.)     Denies 5. DIFFICULTY BREATHING: "Are you having difficulty breathing?" If Yes, ask: "How bad is it?" (e.g., mild, moderate, severe)    - MILD: No SOB at rest, mild SOB with walking, speaks normally in sentences, can lie down, no retractions, pulse < 100.    - MODERATE: SOB at rest, SOB with minimal exertion and prefers to sit, cannot lie down flat, speaks in phrases, mild retractions, audible wheezing, pulse 100-120.    - SEVERE: Very SOB at rest, speaks in single words, struggling to breathe, sitting hunched  forward, retractions, pulse > 120      Denies 6. FEVER: "Do you have a fever?" If Yes, ask: "What is your temperature, how was it measured, and when did it start?"     Denies 7. CARDIAC HISTORY: "Do you have any history of heart disease?" (e.g., heart attack, congestive heart failure)      Denies 8. LUNG HISTORY: "Do you have any history of lung disease?"  (e.g., pulmonary embolus, asthma, emphysema)     Denies 9. PE RISK FACTORS: "Do you have a history of blood clots?" (or: recent major surgery, recent prolonged travel, bedridden)     Denies 10. OTHER SYMPTOMS: "Do you have any other symptoms?" (e.g., runny nose, wheezing, chest pain)       + runny nose 12. TRAVEL: "Have you traveled out of the country in the last month?" (e.g., travel history, exposures)       Denies  Protocols used: Cough - Acute Productive-A-AH

## 2023-05-26 ENCOUNTER — Emergency Department (HOSPITAL_COMMUNITY): Payer: Self-pay

## 2023-05-26 ENCOUNTER — Encounter (HOSPITAL_COMMUNITY): Payer: Self-pay | Admitting: *Deleted

## 2023-05-26 ENCOUNTER — Other Ambulatory Visit: Payer: Self-pay

## 2023-05-26 ENCOUNTER — Emergency Department (HOSPITAL_COMMUNITY)
Admission: EM | Admit: 2023-05-26 | Discharge: 2023-05-26 | Disposition: A | Discharge status: Home / Self Care | Payer: Self-pay

## 2023-05-26 DIAGNOSIS — B349 Viral infection, unspecified: Secondary | ICD-10-CM | POA: Insufficient documentation

## 2023-05-26 DIAGNOSIS — I10 Essential (primary) hypertension: Secondary | ICD-10-CM | POA: Insufficient documentation

## 2023-05-26 DIAGNOSIS — Z91010 Allergy to peanuts: Secondary | ICD-10-CM | POA: Insufficient documentation

## 2023-05-26 DIAGNOSIS — S8001XA Contusion of right knee, initial encounter: Secondary | ICD-10-CM | POA: Insufficient documentation

## 2023-05-26 DIAGNOSIS — W228XXA Striking against or struck by other objects, initial encounter: Secondary | ICD-10-CM | POA: Insufficient documentation

## 2023-05-26 DIAGNOSIS — Z1152 Encounter for screening for COVID-19: Secondary | ICD-10-CM | POA: Insufficient documentation

## 2023-05-26 LAB — RESP PANEL BY RT-PCR (RSV, FLU A&B, COVID)  RVPGX2
Influenza A by PCR: NEGATIVE
Influenza B by PCR: NEGATIVE
Resp Syncytial Virus by PCR: NEGATIVE
SARS Coronavirus 2 by RT PCR: NEGATIVE

## 2023-05-26 MED ORDER — AMOXICILLIN-POT CLAVULANATE 875-125 MG PO TABS: 1.0000 | ORAL_TABLET | Freq: Two times a day (BID) | ORAL | 0 refills | Status: DC

## 2023-05-26 MED ORDER — PSEUDOEPH-BROMPHEN-DM 30-2-10 MG/5ML PO SYRP: 5.0000 mL | ORAL_SOLUTION | Freq: Four times a day (QID) | ORAL | 0 refills | Status: DC | PRN

## 2023-05-26 MED ORDER — NAPROXEN 500 MG PO TABS: 500.0000 mg | ORAL_TABLET | Freq: Two times a day (BID) | ORAL | 0 refills | Status: DC

## 2023-05-26 NOTE — ED Triage Notes (Signed)
Pt c/o generalized body aches with cough and sore throat x 4 days  Pt also c/o pain and bruising to right knee after hitting it twice in a week

## 2023-05-26 NOTE — Discharge Instructions (Signed)
I think that your symptoms are from a viral illness.  Your viral testing here today was negative for flu and COVID.  Your x-ray did not show any concerning findings.  Your x-ray of your knee was also reassuring.  Please take the naproxen as needed for knee pain.  Take the cough medication as needed for cough.  Please hold off on the antibiotic for at least 3-6 more days.  If you are still having symptoms then I think it was okay to start but I do think that this is a viral infection and should improve with time and medications.  Please try to hold off as taking antibiotics unnecessarily can mean that you build up resistance and when you need them in the future they will not work.

## 2023-05-26 NOTE — ED Provider Notes (Signed)
[place] [facility] AT [facility] Provider Note   CSN: [id number] Arrival date & time: 05/26/23  6213     History  Chief Complaint  Patient presents with   Generalized Body Aches    Precilla Asencio is a 52 y.o. female.  52 year old female with past medical history of hypertension presenting to the emergency department today with pain in her right knee.  The patient states that she has hit her right knee twice in the past 2 weeks and states that it hurts to walk on it now.  She noticed some bruising over her knee this morning.  She also is complaining of some nasal congestion, cough, sneezing that has been going now for the past 4 days.  The patient tried to get into see her primary care provider but they were unable to get her an appointment until January.  She came to the ER at that time for further evaluation.  She denies any fevers.  Denies any difficulty breathing.  She denies any difficulty swallowing.  She does report having a mild sore throat initially which has improved.        Home Medications Prior to Admission medications   Medication Sig Start Date End Date Taking? Authorizing Provider  amoxicillin-clavulanate (AUGMENTIN) 875-125 MG tablet Take 1 tablet by mouth every 12 (twelve) hours. 05/26/23  Yes Durwin Glaze, MD  brompheniramine-pseudoephedrine-DM 30-2-10 MG/5ML syrup Take 5 mLs by mouth 4 (four) times daily as needed. 05/26/23  Yes Durwin Glaze, MD  fluconazole (DIFLUCAN) 150 MG tablet Take 1 now and 1 in 3 days 02/18/23   Adline Potter, NP  naproxen (NAPROSYN) 500 MG tablet Take 1 tablet (500 mg total) by mouth 2 (two) times daily. 05/26/23  Yes Durwin Glaze, MD  budesonide (ENTOCORT EC) 3 MG 24 hr capsule TAKE 3 CAPSULES BY MOUTH DAILY FOR 28 DAYS, THEN 2 CAPS DAILY X28 DAYS, THEN 1 CAP DAILY X28 DAYS. 12/10/22   Carlan, Chelsea L, NP  cetirizine HCl (ZYRTEC) 1 MG/ML solution TAKE 1/2 TO 1 TEASPOON BY MOUTH DAILY AS , NEEDED, FOR ALLERGIES  (OTC) 05/07/21   Anabel Halon, MD  diltiazem (CARDIZEM CD) 120 MG 24 hr capsule TAKE ONE CAPSULE BY MOUTH ONCE DAILY 02/02/23   Kerri Perches, MD  gabapentin (NEURONTIN) 100 MG capsule Take 1 capsule (100 mg total) by mouth 3 (three) times daily. 03/12/23 04/11/23  Judithann Sheen, PA  hydrochlorothiazide (HYDRODIURIL) 25 MG tablet TAKE 1 TABLET (25 MG TOTAL) BY MOUTH DAILY. 07/08/22   Kerri Perches, MD  metoprolol tartrate (LOPRESSOR) 25 MG tablet TAKE 1/2 TABLET BY MOUTH TWICE DAILY 02/16/23   Kerri Perches, MD  metroNIDAZOLE (FLAGYL) 500 MG tablet Take 1 tablet (500 mg total) by mouth 2 (two) times daily. 02/18/23   Adline Potter, NP  potassium chloride SA (KLOR-CON M20) 20 MEQ tablet TAKE 2 TABLETS BY MOUTH TWICE A DAY 06/24/22   Kerri Perches, MD      Allergies    Biaxin [clarithromycin], Iodinated contrast media, Depo-medrol [methylprednisolone], Shellfish allergy, Yellow jacket venom [bee venom], and Peanut-containing drug products    Review of Systems   Review of Systems  HENT:  Positive for congestion.   Respiratory:  Positive for cough.   All other systems reviewed and are negative.   Physical Exam Updated Vital Signs BP 121/78   Pulse (!) 58   Temp 98.9 F (37.2 C) (Oral)   Resp 17  Ht 5\' 6"  (1.676 m)   Wt 78.9 kg   LMP 04/26/2023   SpO2 98%   BMI 28.08 kg/m  Physical Exam Vitals and nursing note reviewed.   Gen: NAD Eyes: PERRL, EOMI HEENT: no oropharyngeal swelling, no exudates Neck: trachea midline Resp: Slightly diminished over the left middle and lower lung field with no wheezes or rhonchi noted Card: RRR, no murmurs, rubs, or gallops Abd: nontender, nondistended Extremities: no calf tenderness, no edema, patient has normal range of motion of the right knee, she is tender over the medial joint line of the right knee, knee joint is stable.  No overlying erythema noted Vascular: 2+ radial pulses bilaterally, 2+ DP pulses  bilaterally Skin: no rashes Psyc: acting appropriately   ED Results / Procedures / Treatments   Labs (all labs ordered are listed, but only abnormal results are displayed) Labs Reviewed  RESP PANEL BY RT-PCR (RSV, FLU A&B, COVID)  [id number]    EKG None  Radiology DG Chest Portable 1 View  Result Date: 05/26/2023 CLINICAL DATA:  Trauma. EXAM: PORTABLE CHEST 1 VIEW COMPARISON:  Chest two views 11/18/2009 FINDINGS: Cardiac silhouette and mediastinal contours are within normal limits. The lungs are clear. No pleural effusion or pneumothorax. Minimal levocurvature of the upper thoracic spine is similar to prior. IMPRESSION: No acute cardiopulmonary disease process. Electronically Signed   By: Neita Garnet M.D.   On: 05/26/2023 11:07   DG Knee Complete 4 Views Right  Result Date: 05/26/2023 CLINICAL DATA:  Trauma pain and bruising to right knee. EXAM: RIGHT KNEE - COMPLETE 4+ VIEW COMPARISON:  Right knee radiographs 06/12/2022 FINDINGS: Mild superior greater than inferior patellar degenerative osteophytosis is similar to prior. No patellar sunrise view was performed for the current study, however mild-to-moderate lateral patellar facet degenerative spurring was seen previously on that view. No joint effusion. Mild chronic enthesopathic change at the quadriceps insertion on the patella. Minimal peripheral degenerative spurring at the medial and lateral compartments without significant joint space narrowing. No acute fracture or dislocation. IMPRESSION: 1. Mild patellofemoral compartment osteoarthritis. 2. No acute fracture. Electronically Signed   By: Neita Garnet M.D.   On: 05/26/2023 11:06    Procedures Procedures    Medications Ordered in ED Medications - No data to display  ED Course/ Medical Decision Making/ A&P                                 Medical Decision Making 52 year old female with past medical history of hypertension presenting to the emergency department today with  symptoms consistent with URI.  The patient does have diminished breath sounds here.  Will obtain x-ray to evaluate for pneumonia or pulmonary edema.  Also obtain RSV/COVID/flu swab on the patient.  I will obtain x-ray of her knee as well to evaluate for fracture as the patient reports that he is having pain with walking given the recent trauma.  There are no findings on exam concerning for septic arthritis at this time.  I will reevaluate for ultimate disposition.  The patient's work appears reassuring.  X-ray is unremarkable of her chest.  Knee x-ray shows some arthritis but no other acute findings.  She is started on NSAIDs.  She is discharged with return precautions.  I did provide the patient a prescription for antibiotics if she continues to have symptoms after the 7 to 10-day mark.  I did spend extensive amount of time speak with the  patient about this and the importance of holding off on this unless absolutely necessary to prevent antibiotic resistance.  This was given because patient was unable to see her primary care provider until January.  Amount and/or Complexity of Data Reviewed Radiology: ordered.  Risk Prescription drug management.           Final Clinical Impression(s) / ED Diagnoses Final diagnoses:  Viral syndrome  Contusion of right knee, initial encounter    Rx / DC Orders ED Discharge Orders          Ordered    naproxen (NAPROSYN) 500 MG tablet  2 times daily        05/26/23 1112    amoxicillin-clavulanate (AUGMENTIN) 875-125 MG tablet  Every 12 hours        05/26/23 1112    brompheniramine-pseudoephedrine-DM 30-2-10 MG/5ML syrup  4 times daily PRN        05/26/23 1112              Durwin Glaze, MD 05/26/23 1114

## 2023-05-27 ENCOUNTER — Telehealth: Payer: Self-pay | Admitting: Family Medicine

## 2023-05-27 NOTE — Telephone Encounter (Signed)
Pt was seen in the er yesterday and was prescribed amoxicillin she [place] allergic to it she needs a new prescription called

## 2023-05-28 MED ORDER — SULFAMETHOXAZOLE-TRIMETHOPRIM 800-160 MG PO TABS
1.0000 | ORAL_TABLET | Freq: Two times a day (BID) | ORAL | 0 refills | Status: DC
Start: 2023-05-28 — End: 2023-06-10

## 2023-05-28 MED ORDER — FLUCONAZOLE 150 MG PO TABS: ORAL_TABLET | ORAL | 1 refills | Status: DC

## 2023-05-28 NOTE — Telephone Encounter (Signed)
Patient aware.

## 2023-05-28 NOTE — Telephone Encounter (Signed)
Septra senrt in let herr know pls, also need to document her allergy to augmentin, not on current record, so ask her what it is and enter please. I also refilled fluconazole in case she needs it pls let her know

## 2023-05-29 ENCOUNTER — Other Ambulatory Visit: Payer: Self-pay | Admitting: Family Medicine

## 2023-06-10 ENCOUNTER — Ambulatory Visit: Payer: 59 | Admitting: Adult Health

## 2023-06-10 ENCOUNTER — Encounter: Payer: Self-pay | Admitting: Adult Health

## 2023-06-10 VITALS — BP 134/83 | HR 64 | Ht 66.0 in | Wt 179.5 lb

## 2023-06-10 DIAGNOSIS — Z1331 Encounter for screening for depression: Secondary | ICD-10-CM

## 2023-06-10 DIAGNOSIS — Z01419 Encounter for gynecological examination (general) (routine) without abnormal findings: Secondary | ICD-10-CM | POA: Diagnosis not present

## 2023-06-10 DIAGNOSIS — Z1211 Encounter for screening for malignant neoplasm of colon: Secondary | ICD-10-CM | POA: Insufficient documentation

## 2023-06-10 LAB — HEMOCCULT GUIAC POC 1CARD (OFFICE): Fecal Occult Blood, POC: NEGATIVE

## 2023-06-10 NOTE — Progress Notes (Signed)
Patient ID: [name], female   DOB: 09/21/1970, 52 y.o.   MRN: [id number] History of Present Illness: [name] is a 52 year old black female, divorced, [id number] in for a well woman gyn exam. She was diagnosed with Crohn's this year. She is going to L[employer] school.     Component Value Date/Time   DIAGPAP  11/21/2020 1204    - Negative for intraepithelial lesion or malignancy (NILM)   DIAGPAP  01/28/2018 0000    NEGATIVE FOR INTRAEPITHELIAL LESIONS OR MALIGNANCY.   HPVHIGH Negative 11/21/2020 1204   ADEQPAP  11/21/2020 1204    Satisfactory for evaluation; transformation zone component PRESENT.   ADEQPAP  01/28/2018 0000    Satisfactory for evaluation  endocervical/transformation zone component PRESENT.    PCP is Dr Lodema Hong   Current Medications, Allergies, Past Medical History, Past Surgical History, Family History and Social History were reviewed in [employer] electronic medical record.     Review of Systems: Patient denies any headaches, hearing loss, fatigue, blurred vision, shortness of breath, chest pain, abdominal pain, problems with bowel movements, urination, or intercourse. No joint pain or mood swings.  Periods monthly   Physical Exam:BP 134/83 (BP Location: Left Arm, Patient Position: Sitting, Cuff Size: Normal)   Pulse 64   Ht 5\' 6"  (1.676 m)   Wt 179 lb 8 oz (81.4 kg)   LMP 06/04/2023   BMI 28.97 kg/m   General:  Well developed, well nourished, no acute distress Skin:  Warm and dry Neck:  Midline trachea, normal thyroid, good ROM, no lymphadenopathy Lungs; Clear to auscultation bilaterally Breast:  No dominant palpable mass, retraction, or nipple discharge Cardiovascular: Regular rate and rhythm Abdomen:  Soft, non tender, no hepatosplenomegaly Pelvic:  External genitalia is normal in appearance, no lesions.  The vagina is normal in appearance. Urethra has no lesions or masses. The cervix is bulbous.  Uterus is felt to be normal size, shape, and contour.  No adnexal  masses or tenderness noted.Bladder is non tender, no masses felt. Rectal: Good sphincter tone, no polyps, or hemorrhoids felt.  Hemoccult negative. Extremities/musculoskeletal:  No swelling or varicosities noted, no clubbing or cyanosis Psych:  No mood changes, alert and cooperative,seems happy AA is 0 Fall risk is low    06/10/2023    8:39 AM 10/23/2022    4:18 PM 08/29/2022    3:32 PM  Depression screen PHQ 2/9  Decreased Interest 0 0 0  Down, Depressed, Hopeless 0 0 0  PHQ - 2 Score 0 0 0  Altered sleeping 0    Tired, decreased energy 0    Change in appetite 0    Feeling bad or failure about yourself  0    Trouble concentrating 0    Moving slowly or fidgety/restless 0    Suicidal thoughts 0    PHQ-9 Score 0         06/10/2023    8:39 AM 07/16/2022    8:18 AM 04/02/2022    1:48 PM 02/05/2022    1:54 PM  GAD 7 : Generalized Anxiety Score  Nervous, Anxious, on Edge 0 0 1 0  Control/stop worrying 0 0 1 0  Worry too much - different things 0 0 1 0  Trouble relaxing 0 1 1 0  Restless 0 0 1 0  Easily annoyed or irritable 0 0 0 0  Afraid - awful might happen 0 0 0 0  Total GAD 7 Score 0 1 5 0  Anxiety Difficulty  Very difficult  Upstream - 06/10/23 0845       Pregnancy Intention Screening   Does the patient want to become pregnant in the next year? N/A    Does the patient's partner want to become pregnant in the next year? N/A    Would the patient like to discuss contraceptive options today? N/A      Contraception Wrap Up   Current Method Female Sterilization    End Method Female Sterilization              Examination chaperoned by Malachy Mood LPN   Impression and plan: 1. Encounter for well woman exam with routine gynecological exam Pap and physical in 1 year Mammogram was negative 07/09/22 Colonoscopy per GI Labs with PCP Stay active   2. Encounter for screening fecal occult blood testing Hemoccult was negative

## 2023-07-13 ENCOUNTER — Other Ambulatory Visit: Payer: Self-pay | Admitting: Family Medicine

## 2023-07-30 ENCOUNTER — Encounter: Payer: Self-pay | Admitting: Family Medicine

## 2023-07-30 ENCOUNTER — Ambulatory Visit (INDEPENDENT_AMBULATORY_CARE_PROVIDER_SITE_OTHER): Payer: 59 | Admitting: Family Medicine

## 2023-07-30 VITALS — BP 124/82 | HR 80 | Ht 66.0 in | Wt 181.0 lb

## 2023-07-30 DIAGNOSIS — Z1231 Encounter for screening mammogram for malignant neoplasm of breast: Secondary | ICD-10-CM | POA: Diagnosis not present

## 2023-07-30 DIAGNOSIS — E7849 Other hyperlipidemia: Secondary | ICD-10-CM

## 2023-07-30 DIAGNOSIS — E559 Vitamin D deficiency, unspecified: Secondary | ICD-10-CM

## 2023-07-30 DIAGNOSIS — E663 Overweight: Secondary | ICD-10-CM

## 2023-07-30 DIAGNOSIS — E785 Hyperlipidemia, unspecified: Secondary | ICD-10-CM

## 2023-07-30 DIAGNOSIS — I10 Essential (primary) hypertension: Secondary | ICD-10-CM | POA: Diagnosis not present

## 2023-07-30 DIAGNOSIS — E0789 Other specified disorders of thyroid: Secondary | ICD-10-CM

## 2023-07-30 DIAGNOSIS — R0781 Pleurodynia: Secondary | ICD-10-CM

## 2023-07-30 DIAGNOSIS — R7302 Impaired glucose tolerance (oral): Secondary | ICD-10-CM | POA: Insufficient documentation

## 2023-07-30 MED ORDER — HYDROCHLOROTHIAZIDE 25 MG PO TABS
25.0000 mg | ORAL_TABLET | Freq: Every day | ORAL | 3 refills | Status: AC
  Filled 2024-02-25: qty 90, 90d supply, fill #0

## 2023-07-30 MED ORDER — DILTIAZEM HCL ER COATED BEADS 120 MG PO CP24: 120.0000 mg | ORAL_CAPSULE | Freq: Every day | ORAL | 2 refills | Status: DC

## 2023-07-30 MED ORDER — METOPROLOL TARTRATE 25 MG PO TABS: ORAL_TABLET | ORAL | 3 refills | Status: DC

## 2023-07-30 MED ORDER — POTASSIUM CHLORIDE CRYS ER 20 MEQ PO TBCR
EXTENDED_RELEASE_TABLET | ORAL | 3 refills | Status: DC
  Filled 2024-02-25: qty 360, 90d supply, fill #0

## 2023-07-30 NOTE — Patient Instructions (Addendum)
F/U in 5 monhts, call if you need me sooner   Please schedule mammogram at checkout    Fasting lipid, cmp and eGFr, TSH and Vit d, CBC in next 1 week  It is important that you exercise regularly at least 30 minutes 5 times a week. If you develop chest pain, have severe difficulty breathing, or feel very tired, stop exercising immediately and seek medical attention   Think about what you will eat, plan ahead. Choose " clean, green, fresh or frozen" over canned, processed or packaged foods which are more sugary, salty and fatty. 70 to 75% of food eaten should be vegetables and fruit. Three meals at set times with snacks allowed between meals, but they must be fruit or vegetables. Aim to eat over a 12 hour period , example 7 am to 7 pm, and STOP after  your last meal of the day. Drink water,generally about 64 ounces per day, no other drink is as healthy. Fruit juice is best enjoyed in a healthy way, by EATING the fruit.  Thanks for choosing [facility], we consider it a privelige to serve you.

## 2023-07-30 NOTE — Assessment & Plan Note (Signed)
Controlled, no change in medication DASH diet and commitment to daily physical activity for a minimum of 30 minutes discussed and encouraged, as a part of hypertension management. The importance of attaining a healthy weight is also discussed.     07/30/2023    4:33 PM 06/10/2023    8:40 AM 05/26/2023   11:30 AM 05/26/2023   10:30 AM 05/26/2023   10:00 AM 05/26/2023    8:24 AM 05/26/2023    8:16 AM  BP/Weight  Systolic BP 124 134 122 121 114  130  Diastolic BP 82 83 80 78 77  77  Wt. (Lbs) 181.04 179.5    174   BMI 29.22 kg/m2 28.97 kg/m2    28.08 kg/m2

## 2023-07-30 NOTE — Assessment & Plan Note (Signed)
Hyperlipidemia:Low fat diet discussed and encouraged.   Lipid Panel  Lab Results  Component Value Date   CHOL 154 09/10/2021   HDL 39 (L) 09/10/2021   LDLCALC 98 09/10/2021   TRIG 90 09/10/2021   CHOLHDL 3.9 09/10/2021     Updated lab needed at/ before next visit.

## 2023-07-30 NOTE — Assessment & Plan Note (Signed)
Patient educated about the importance of limiting  Carbohydrate intake , the need to commit to daily physical activity for a minimum of 30 minutes , and to commit weight loss. The fact that changes in all these areas will reduce or eliminate all together the development of diabetes is stressed.      Latest Ref Rng & Units 11/21/2022    1:32 PM 11/17/2022   11:00 AM 10/20/2022   10:45 PM 06/18/2022   10:09 AM 05/02/2022    8:28 AM  Diabetic Labs  Creatinine 0.44 - 1.00 mg/dL 3.47  4.25  9.56  3.87  1.09       07/30/2023    4:33 PM 06/10/2023    8:40 AM 05/26/2023   11:30 AM 05/26/2023   10:30 AM 05/26/2023   10:00 AM 05/26/2023    8:24 AM 05/26/2023    8:16 AM  BP/Weight  Systolic BP 124 134 122 121 114  130  Diastolic BP 82 83 80 78 77  77  Wt. (Lbs) 181.04 179.5    174   BMI 29.22 kg/m2 28.97 kg/m2    28.08 kg/m2        No data to display          Updated lab needed at/ before next visit.

## 2023-07-30 NOTE — Progress Notes (Signed)
[name]     MRN: [id number]      DOB: Jul 11, 1970  Chief Complaint  Patient presents with   Care Management    Check up , needs 90 day prescriptions, also reports side pain ongoing for 2-3 weeks now.     HPI [name] is here for follow up and re-evaluation of chronic medical conditions, medication management and review of any available recent lab and radiology data.  Preventive health is updated, specifically  Cancer screening and Immunization.   The PT denies any adverse reactions to current medications since the last visit.  3 week h/o localized left posterior lower rib pain , localized, aggravated by direct pressure. No urinary symptoms, no radiaition, not aggravated by upper body movement , no fevr, chills ,not radiaiting   ROS Denies recent fever or chills. Denies sinus pressure, nasal congestion, ear pain or sore throat. Denies chest congestion, productive cough or wheezing. Denies chest pains, palpitations and leg swelling Denies abdominal pain, nausea, vomiting,diarrhea or constipation.   Denies dysuria, frequency, hesitancy or incontinence. Denies headaches, seizures, numbness, or tingling. Denies depression, anxiety or insomnia. Denies skin break down or rash.   PE  BP 124/82   Pulse 80   Ht 5\' 6"  (1.676 m)   Wt 181 lb 0.6 oz (82.1 kg)   SpO2 96%   BMI 29.22 kg/m   Patient alert and oriented and in no cardiopulmonary distress.  HEENT: No facial asymmetry, EOMI,     Neck supple .  Chest: Clear to auscultation bilaterally.localized tenderness LL posterior rib pain with direct pressure  CVS: S1, S2 no murmurs, no S3.Regular rate.  ABD: Soft non tender.   Ext: No edema  MS: Adequate ROM spine, shoulders, hips and knees.  Skin: Intact, no ulcerations or rash noted.  Psych: Good eye contact, normal affect. Memory intact not anxious or depressed appearing.  CNS: CN 2-12 intact, power,  normal throughout.no focal deficits noted.   Assessment &  Plan  Essential hypertension Controlled, no change in medication DASH diet and commitment to daily physical activity for a minimum of 30 minutes discussed and encouraged, as a part of hypertension management. The importance of attaining a healthy weight is also discussed.     07/30/2023    4:33 PM 06/10/2023    8:40 AM 05/26/2023   11:30 AM 05/26/2023   10:30 AM 05/26/2023   10:00 AM 05/26/2023    8:24 AM 05/26/2023    8:16 AM  BP/Weight  Systolic BP 124 134 122 121 114  130  Diastolic BP 82 83 80 78 77  77  Wt. (Lbs) 181.04 179.5    174   BMI 29.22 kg/m2 28.97 kg/m2    28.08 kg/m2        Dyslipidemia Hyperlipidemia:Low fat diet discussed and encouraged.   Lipid Panel  Lab Results  Component Value Date   CHOL 154 09/10/2021   HDL 39 (L) 09/10/2021   LDLCALC 98 09/10/2021   TRIG 90 09/10/2021   CHOLHDL 3.9 09/10/2021     Updated lab needed at/ before next visit.   IGT (impaired glucose tolerance) Patient educated about the importance of limiting  Carbohydrate intake , the need to commit to daily physical activity for a minimum of 30 minutes , and to commit weight loss. The fact that changes in all these areas will reduce or eliminate all together the development of diabetes is stressed.      Latest Ref Rng & Units 11/21/2022  1:32 PM 11/17/2022   11:00 AM 10/20/2022   10:45 PM 06/18/2022   10:09 AM 05/02/2022    8:28 AM  Diabetic Labs  Creatinine 0.44 - 1.00 mg/dL 9.56  2.13  0.86  5.78  1.09       07/30/2023    4:33 PM 06/10/2023    8:40 AM 05/26/2023   11:30 AM 05/26/2023   10:30 AM 05/26/2023   10:00 AM 05/26/2023    8:24 AM 05/26/2023    8:16 AM  BP/Weight  Systolic BP 124 134 122 121 114  130  Diastolic BP 82 83 80 78 77  77  Wt. (Lbs) 181.04 179.5    174   BMI 29.22 kg/m2 28.97 kg/m2    28.08 kg/m2        No data to display          Updated lab needed at/ before next visit.   Overweight (BMI 25.0-29.9)  Patient re-educated about   the importance of commitment to a  minimum of 150 minutes of exercise per week as able.  The importance of healthy food choices with portion control discussed, as well as eating regularly and within a 12 hour window most days. The need to choose "clean , green" food 50 to 75% of the time is discussed, as well as to make water the primary drink and set a goal of 64 ounces water daily.       07/30/2023    4:33 PM 06/10/2023    8:40 AM 05/26/2023    8:24 AM  Weight /BMI  Weight 181 lb 0.6 oz 179 lb 8 oz 174 lb  Height 5\' 6"  (1.676 m) 5\' 6"  (1.676 m) 5\' 6"  (1.676 m)  BMI 29.22 kg/m2 28.97 kg/m2 28.08 kg/m2    deteriorated  Rib pain on left side Pain with direct pressure recommend twice daily tylenol for 5 days trhen as needed, also topical analgesic as needed, if persists or worsens will order X ray

## 2023-07-30 NOTE — Assessment & Plan Note (Signed)
Pain with direct pressure recommend twice daily tylenol for 5 days trhen as needed, also topical analgesic as needed, if persists or worsens will order X ray

## 2023-07-30 NOTE — Assessment & Plan Note (Signed)
  Patient re-educated about  the importance of commitment to a  minimum of 150 minutes of exercise per week as able.  The importance of healthy food choices with portion control discussed, as well as eating regularly and within a 12 hour window most days. The need to choose "clean , green" food 50 to 75% of the time is discussed, as well as to make water the primary drink and set a goal of 64 ounces water daily.       07/30/2023    4:33 PM 06/10/2023    8:40 AM 05/26/2023    8:24 AM  Weight /BMI  Weight 181 lb 0.6 oz 179 lb 8 oz 174 lb  Height 5\' 6"  (1.676 m) 5\' 6"  (1.676 m) 5\' 6"  (1.676 m)  BMI 29.22 kg/m2 28.97 kg/m2 28.08 kg/m2    deteriorated

## 2023-08-03 ENCOUNTER — Inpatient Hospital Stay (HOSPITAL_COMMUNITY): Admission: RE | Admit: 2023-08-03 | Payer: 59 | Source: Ambulatory Visit

## 2023-08-05 ENCOUNTER — Ambulatory Visit: Payer: Self-pay | Admitting: Family Medicine

## 2023-08-14 ENCOUNTER — Encounter: Payer: Self-pay | Admitting: Family Medicine

## 2023-08-14 ENCOUNTER — Ambulatory Visit (INDEPENDENT_AMBULATORY_CARE_PROVIDER_SITE_OTHER): Payer: 59 | Admitting: Family Medicine

## 2023-08-14 VITALS — BP 121/78 | HR 65 | Ht 66.0 in | Wt 177.1 lb

## 2023-08-14 DIAGNOSIS — B349 Viral infection, unspecified: Secondary | ICD-10-CM

## 2023-08-14 DIAGNOSIS — Z20822 Contact with and (suspected) exposure to covid-19: Secondary | ICD-10-CM | POA: Diagnosis not present

## 2023-08-14 NOTE — Patient Instructions (Signed)
 I appreciate the opportunity to provide care to you today!   Viral Illness:  I recommend symptomatic treatments with the following: Start taking Promethazine  DM 5 mL by mouth every 4 hours as needed for cough and cold symptoms. Increase fluid intake and allow for plenty of rest. Take Tylenol  as needed for pain, fever, or general discomfort. Perform warm saltwater gargles 3-4 times daily to help with throat pain or discomfort. (Mix 1/2 teaspoon of salt in a glass of warm water and gargle several times daily to reduce throat inflammation and soothe irritation.) Ginger tea: Reduces throat irritation and can help with nausea. Look for sugar-free or honey-based throat lozenges to help with a sore throat. Use a humidifier at bedtime to help with cough and nasal congestion. For nasal congestion: The use of heated humidified air is a safe and effective therapy. Saline nasal sprays may also help alleviate nasal symptoms of the common cold. For cough: Treatment with honey may reduce cough frequency and severity. Follow up if your symptoms do not improve    Please continue to a heart-healthy diet and increase your physical activities. Try to exercise for at least five days a week.    It was a pleasure to see you and I look forward to continuing to work together on your health and well-being. Please do not hesitate to call the office if you need care or have questions about your care.  In case of emergency, please visit the Emergency Department for urgent care, or contact our clinic at [phone] to schedule an appointment. We're here to help you!   Have a wonderful day and week. With Gratitude, Tranisha Tissue MSN, FNP-BC

## 2023-08-14 NOTE — Progress Notes (Signed)
 Acute Office Visit  Subjective:    Patient ID: [name], female    DOB: 02/14/1971, 53 y.o.   MRN: [id number]  Chief Complaint  Patient presents with   Acute Visit    stomach is hurting, head is hurting, throat is scratchy, x 2 days. wants to get covid and flu test. Due to exposure to covid from family member.     HPI Patient is in today reporting for exposure to covid 2 days ago. For the details of today's visit, please refer to the assessment and plan.     Past Medical History:  Diagnosis Date   Back pain    BV (bacterial vaginosis) 09/16/2013   Chest pain 2000   right    Contraceptive management 09/16/2013   Crohn disease (HCC)    Encephalocele (HCC) 1994   Enlarged thyroid     HSV-2 (herpes simplex virus 2) infection    Hypertension    Irregular intermenstrual bleeding 04/27/2015   Obesity    Osteoarthritis    Preterm labor    Rheumatoid arthritis (HCC)    Rotator cuff tear    Sleep disorder    Thyroid  nodule    Urinary frequency 12/14/2014   Vaginal discharge 09/16/2013    Past Surgical History:  Procedure Laterality Date   BIOPSY  11/21/2022   Procedure: BIOPSY;  Surgeon: Eartha Angelia Sieving, MD;  Location: [facility];  Service: Gastroenterology;;   BREAST BIOPSY  2007   btl  1994   COLONOSCOPY WITH PROPOFOL  N/A 11/21/2022   Procedure: COLONOSCOPY WITH PROPOFOL ;  Surgeon: Eartha Angelia Sieving, MD;  Location: [facility];  Service: Gastroenterology;  Laterality: N/A;  1:00PM;ASA 1-2   DILITATION & CURRETTAGE/HYSTROSCOPY WITH NOVASURE ABLATION N/A 05/06/2016   Procedure: DILATATION & CURETTAGE/HYSTEROSCOPY WITH NOVASURE ABLATION;  Surgeon: Norleen LULLA Server, MD;  Location: [facility];  Service: Gynecology;  Laterality: N/A;  length-5; width-2.6; power-72 ; time- 1 minute 42 seconds   LAPAROSCOPIC BILATERAL SALPINGECTOMY Bilateral 05/06/2016   Procedure: LAPAROSCOPIC BILATERAL SALPINGECTOMY;  Surgeon: Norleen LULLA Server, MD;  Location: [facility];  Service:  Gynecology;  Laterality: Bilateral;   RETAINED PLACENTA REMOVAL     TUBAL LIGATION     04/2016   tubal ligation reversal  2001   WISDOM TOOTH EXTRACTION      Family History  Problem Relation Age of Onset   Cancer Paternal Grandmother        breast   Seizures Maternal Grandmother    Cancer Father        thyroid     Hypertension Father    Anuerysm Mother    Stroke Mother    Thyroid  cancer Daughter     Social History   Socioeconomic History   Marital status: Divorced    Spouse name: Not on file   Number of children: 4   Years of education: Not on file   Highest education level: Not on file  Occupational History   Not on file  Tobacco Use   Smoking status: Never    Passive exposure: Never   Smokeless tobacco: Never  Vaping Use   Vaping status: Never Used  Substance and Sexual Activity   Alcohol use: No   Drug use: No   Sexual activity: Yes    Partners: Male    Birth control/protection: Surgical    Comment: tubal and ablation  Other Topics Concern   Not on file  Social History Narrative   Not on file   Social Drivers of Health  Financial Resource Strain: Low Risk  (06/10/2023)   Overall Financial Resource Strain (CARDIA)    Difficulty of Paying Living Expenses: Not hard at all  Food Insecurity: No Food Insecurity (06/10/2023)   Hunger Vital Sign    Worried About Running Out of Food in the Last Year: Never true    Ran Out of Food in the Last Year: Never true  Transportation Needs: No Transportation Needs (06/10/2023)   PRAPARE - Administrator, Civil Service (Medical): No    Lack of Transportation (Non-Medical): No  Physical Activity: Insufficiently Active (06/10/2023)   Exercise Vital Sign    Days of Exercise per Week: 2 days    Minutes of Exercise per Session: 10 min  Stress: No Stress Concern Present (06/10/2023)   [employer] of Occupational Health - Occupational Stress Questionnaire    Feeling of Stress : Not at all  Social Connections:  Moderately Integrated (06/10/2023)   Social Connection and Isolation Panel [NHANES]    Frequency of Communication with Friends and Family: More than three times a week    Frequency of Social Gatherings with Friends and Family: Once a week    Attends Religious Services: More than 4 times per year    Active Member of [employer] or Organizations: Yes    Attends Engineer, Structural: More than 4 times per year    Marital Status: Divorced  Intimate Partner Violence: Not At Risk (06/10/2023)   Humiliation, Afraid, Rape, and Kick questionnaire    Fear of Current or Ex-Partner: No    Emotionally Abused: No    Physically Abused: No    Sexually Abused: No    Outpatient Medications Prior to Visit  Medication Sig Dispense Refill   budesonide  (ENTOCORT EC ) 3 MG 24 hr capsule TAKE 3 CAPSULES BY MOUTH DAILY FOR 28 DAYS, THEN 2 CAPS DAILY X28 DAYS, THEN 1 CAP DAILY X28 DAYS. 168 capsule 0   diltiazem  (CARDIZEM  CD) 120 MG 24 hr capsule Take 1 capsule (120 mg total) by mouth daily. 90 capsule 2   hydrochlorothiazide  (HYDRODIURIL ) 25 MG tablet Take 1 tablet (25 mg total) by mouth daily. 90 tablet 3   metoprolol  tartrate (LOPRESSOR ) 25 MG tablet TAKE 1/2 TABLET BY MOUTH TWICE DAILY. 90 tablet 3   potassium chloride  SA (KLOR-CON  M20) 20 MEQ tablet TAKE 2 TABLETS BY MOUTH TWICE A DAY 360 tablet 3   No facility-administered medications prior to visit.    Allergies  Allergen Reactions   Biaxin [Clarithromycin] Diarrhea, Nausea And Vomiting and Other (See Comments)    Blood in stool   Iodinated Contrast Media Shortness Of Breath    Pt states she had difficulty breathing after receiving iv contrast in 2013.    Amoxicillin -Pot Clavulanate Nausea And Vomiting   Depo-Medrol  [Methylprednisolone ] Itching    Generalized itching worse at injection site, no wheezing   Shellfish Allergy Swelling   Yellow Jacket Venom [Bee Venom] Swelling   Peanut-Containing Drug Products Rash    Review of Systems   Constitutional:  Negative for chills and fever.  HENT:  Positive for congestion and sore throat.   Eyes:  Negative for visual disturbance.  Respiratory:  Negative for chest tightness and shortness of breath.   Neurological:  Negative for dizziness and headaches.       Objective:    Physical Exam HENT:     Head: Normocephalic.     Mouth/Throat:     Mouth: Mucous membranes are moist.  Cardiovascular:  Rate and Rhythm: Normal rate.     Heart sounds: Normal heart sounds.  Pulmonary:     Effort: Pulmonary effort is normal.     Breath sounds: Normal breath sounds.  Neurological:     Mental Status: She is alert.     BP 121/78   Pulse 65   Ht 5' 6 (1.676 m)   Wt 177 lb 1.9 oz (80.3 kg)   LMP 08/09/2023 (Approximate)   SpO2 98%   BMI 28.59 kg/m  Wt Readings from Last 3 Encounters:  08/14/23 177 lb 1.9 oz (80.3 kg)  07/30/23 181 lb 0.6 oz (82.1 kg)  06/10/23 179 lb 8 oz (81.4 kg)       Assessment & Plan:  Exposure to COVID-19 virus Assessment & Plan: Pending COVID results. The patient will be treated symptomatically until the test results are available. She reports that her diarrhea has resolved. I recommended that if her diarrhea returns, she take over-the-counter Pepto-Bismol. I recommend taking Tylenol  for headaches and body aches, performing warm salt gargles 3 to 4 times daily for a scratchy sore throat, staying well hydrated, and getting plenty of rest. I also recommend practicing good hand hygiene, wearing a mask, and avoiding close contact with individuals who are asymptomatic or have covid symptoms to reduce transmission. The patient verbalized understanding and is aware of the plan of care.    Orders: -     COVID-19, Flu A+B and RSV  Note: This chart has been completed using Engineer, Civil (consulting) software, and while attempts have been made to ensure accuracy, certain words and phrases may not be transcribed as intended.    Jencarlos Nicolson, FNP

## 2023-08-15 DIAGNOSIS — Z20822 Contact with and (suspected) exposure to covid-19: Secondary | ICD-10-CM | POA: Insufficient documentation

## 2023-08-15 NOTE — Assessment & Plan Note (Addendum)
 Pending COVID results. The patient will be treated symptomatically until the test results are available. She reports that her diarrhea has resolved. I recommended that if her diarrhea returns, she take over-the-counter Pepto-Bismol. I recommend taking Tylenol  for headaches and body aches, performing warm salt gargles 3 to 4 times daily for a scratchy sore throat, staying well hydrated, and getting plenty of rest. I also recommend practicing good hand hygiene, wearing a mask, and avoiding close contact with individuals who are asymptomatic or have covid symptoms to reduce transmission. The patient verbalized understanding and is aware of the plan of care.

## 2023-08-19 LAB — COVID-19, FLU A+B AND RSV
Influenza A, NAA: NOT DETECTED
Influenza B, NAA: NOT DETECTED
RSV, NAA: NOT DETECTED
SARS-CoV-2, NAA: NOT DETECTED

## 2023-08-20 ENCOUNTER — Encounter: Payer: Self-pay | Admitting: Family Medicine

## 2023-09-03 ENCOUNTER — Other Ambulatory Visit: Payer: Self-pay | Admitting: Family Medicine

## 2023-09-21 ENCOUNTER — Encounter (HOSPITAL_COMMUNITY): Payer: Self-pay

## 2023-09-21 ENCOUNTER — Ambulatory Visit (HOSPITAL_COMMUNITY)
Admission: RE | Admit: 2023-09-21 | Discharge: 2023-09-21 | Disposition: A | Discharge status: Home / Self Care | Source: Ambulatory Visit | Attending: Family Medicine | Admitting: Family Medicine

## 2023-09-21 DIAGNOSIS — Z1231 Encounter for screening mammogram for malignant neoplasm of breast: Secondary | ICD-10-CM | POA: Diagnosis present

## 2023-09-24 ENCOUNTER — Other Ambulatory Visit (HOSPITAL_COMMUNITY): Payer: Self-pay | Admitting: Family Medicine

## 2023-09-24 DIAGNOSIS — R928 Other abnormal and inconclusive findings on diagnostic imaging of breast: Secondary | ICD-10-CM

## 2023-09-29 ENCOUNTER — Encounter (HOSPITAL_COMMUNITY)

## 2023-09-29 ENCOUNTER — Ambulatory Visit (HOSPITAL_COMMUNITY)

## 2023-10-01 ENCOUNTER — Ambulatory Visit (HOSPITAL_COMMUNITY)
Admission: RE | Admit: 2023-10-01 | Discharge: 2023-10-01 | Disposition: A | Discharge status: Home / Self Care | Source: Ambulatory Visit | Attending: Family Medicine | Admitting: Family Medicine

## 2023-10-01 ENCOUNTER — Encounter (HOSPITAL_COMMUNITY): Payer: Self-pay

## 2023-10-01 DIAGNOSIS — R928 Other abnormal and inconclusive findings on diagnostic imaging of breast: Secondary | ICD-10-CM | POA: Insufficient documentation

## 2023-10-15 ENCOUNTER — Emergency Department (HOSPITAL_COMMUNITY)

## 2023-10-15 ENCOUNTER — Other Ambulatory Visit: Payer: Self-pay

## 2023-10-15 ENCOUNTER — Emergency Department (HOSPITAL_COMMUNITY)
Admission: EM | Admit: 2023-10-15 | Discharge: 2023-10-16 | Disposition: A | Discharge status: Home / Self Care | Attending: Emergency Medicine | Admitting: Emergency Medicine

## 2023-10-15 ENCOUNTER — Ambulatory Visit: Payer: Self-pay

## 2023-10-15 DIAGNOSIS — D72829 Elevated white blood cell count, unspecified: Secondary | ICD-10-CM | POA: Insufficient documentation

## 2023-10-15 DIAGNOSIS — K449 Diaphragmatic hernia without obstruction or gangrene: Secondary | ICD-10-CM | POA: Insufficient documentation

## 2023-10-15 DIAGNOSIS — K869 Disease of pancreas, unspecified: Secondary | ICD-10-CM

## 2023-10-15 DIAGNOSIS — K8689 Other specified diseases of pancreas: Secondary | ICD-10-CM | POA: Insufficient documentation

## 2023-10-15 DIAGNOSIS — D259 Leiomyoma of uterus, unspecified: Secondary | ICD-10-CM | POA: Diagnosis not present

## 2023-10-15 DIAGNOSIS — Z91010 Allergy to peanuts: Secondary | ICD-10-CM | POA: Insufficient documentation

## 2023-10-15 DIAGNOSIS — D219 Benign neoplasm of connective and other soft tissue, unspecified: Secondary | ICD-10-CM

## 2023-10-15 DIAGNOSIS — R1013 Epigastric pain: Secondary | ICD-10-CM | POA: Diagnosis present

## 2023-10-15 LAB — CBC
HCT: 41.5 % (ref 36.0–46.0)
Hemoglobin: 13.1 g/dL (ref 12.0–15.0)
MCH: 22.4 pg — ABNORMAL LOW (ref 26.0–34.0)
MCHC: 31.6 g/dL (ref 30.0–36.0)
MCV: 70.8 fL — ABNORMAL LOW (ref 80.0–100.0)
Platelets: 318 10*3/uL (ref 150–400)
RBC: 5.86 MIL/uL — ABNORMAL HIGH (ref 3.87–5.11)
RDW: 17.2 % — ABNORMAL HIGH (ref 11.5–15.5)
WBC: 20.2 10*3/uL — ABNORMAL HIGH (ref 4.0–10.5)
nRBC: 0 % (ref 0.0–0.2)

## 2023-10-15 LAB — LIPASE, BLOOD: Lipase: 53 U/L — ABNORMAL HIGH (ref 11–51)

## 2023-10-15 LAB — URINALYSIS, ROUTINE W REFLEX MICROSCOPIC
Bacteria, UA: NONE SEEN
Bilirubin Urine: NEGATIVE
Glucose, UA: NEGATIVE mg/dL
Hgb urine dipstick: NEGATIVE
Ketones, ur: NEGATIVE mg/dL
Nitrite: NEGATIVE
Protein, ur: 100 mg/dL — AB
Specific Gravity, Urine: 1.032 — ABNORMAL HIGH (ref 1.005–1.030)
pH: 5 (ref 5.0–8.0)

## 2023-10-15 LAB — COMPREHENSIVE METABOLIC PANEL WITH GFR
ALT: 19 U/L (ref 0–44)
AST: 19 U/L (ref 15–41)
Albumin: 4.1 g/dL (ref 3.5–5.0)
Alkaline Phosphatase: 94 U/L (ref 38–126)
Anion gap: 8 (ref 5–15)
BUN: 14 mg/dL (ref 6–20)
CO2: 24 mmol/L (ref 22–32)
Calcium: 8.9 mg/dL (ref 8.9–10.3)
Chloride: 106 mmol/L (ref 98–111)
Creatinine, Ser: 0.88 mg/dL (ref 0.44–1.00)
GFR, Estimated: >60 mL/min (ref >60)
Glucose, Bld: 101 mg/dL — ABNORMAL HIGH (ref 70–99)
Potassium: 3.1 mmol/L — ABNORMAL LOW (ref 3.5–5.1)
Sodium: 138 mmol/L (ref 135–145)
Total Bilirubin: 0.7 mg/dL (ref 0.0–1.2)
Total Protein: 7.9 g/dL (ref 6.5–8.1)

## 2023-10-15 LAB — PREGNANCY, URINE: Preg Test, Ur: NEGATIVE

## 2023-10-15 MED ORDER — ONDANSETRON 4 MG PO TBDP
4.0000 mg | ORAL_TABLET | Freq: Once | ORAL | Status: AC | PRN
  Administered 2023-10-15: 4 mg via ORAL

## 2023-10-15 MED ORDER — ONDANSETRON 4 MG PO TBDP
ORAL_TABLET | ORAL | Status: DC
Start: 2023-10-15 — End: 2023-10-16
  Filled 2023-10-15: qty 1

## 2023-10-15 MED ORDER — ACETAMINOPHEN 500 MG PO TABS
1000.0000 mg | ORAL_TABLET | Freq: Once | ORAL | Status: AC
  Administered 2023-10-15: 1000 mg via ORAL
  Filled 2023-10-15: qty 2

## 2023-10-15 MED ORDER — LACTATED RINGERS IV BOLUS
1000.0000 mL | Freq: Once | INTRAVENOUS | Status: AC
  Administered 2023-10-16: 1000 mL via INTRAVENOUS

## 2023-10-15 NOTE — Telephone Encounter (Signed)
 Copied from CRM [id number]. Topic: Clinical - Red Word Triage >> Oct 15, 2023  5:54 PM Eunice Blase wrote: Red Word that prompted transfer to Nurse Triage: Pt called stated been vomiting since 2 p.m. and abdominal pain.   Chief Complaint: Vomiting  Symptoms: Vomiting, abdominal cramps Frequency: Two episodes  Disposition: [] ED /[x] Urgent Care (no appt availability in office) / [] Appointment(In office/virtual)/ []  [facility]/ [x] Home Care/ [] Refused Recommended Disposition /[] [facility]/ []  Follow-up with PCP Additional Notes: Patient reports that she began to experience vomiting and abdominal cramping at 2pm today. She states that she has vomited twice. She states that she is also experiencing some chills. Patient advised that I could make an appointment for her tomorrow but she declined, stating she will go to urgent care tonight for treatment. Patient instructed to call back for new or worsening symptoms. Patient verbalized understanding and agreement with this plan.     Reason for Disposition  MILD or MODERATE vomiting (e.g., 1 - 5 times / day)  Answer Assessment - Initial Assessment Questions 1. VOMITING SEVERITY: "How many times have you vomited in the past 24 hours?"     - MILD:  1 - 2 times/day    - MODERATE: 3 - 5 times/day, decreased oral intake without significant weight loss or symptoms of dehydration    - SEVERE: 6 or more times/day, vomits everything or nearly everything, with significant weight loss, symptoms of dehydration      Twice 2. ONSET: "When did the vomiting begin?"      2 pm today  3. FLUIDS: "What fluids or food have you vomited up today?" "Have you been able to keep any fluids down?"     No 4. ABDOMEN PAIN: "Are your having any abdomen pain?" If Yes : "How bad is it and what does it feel like?" (e.g., crampy, dull, intermittent, constant)      Crampy 5. DIARRHEA: "Is there any diarrhea?" If Yes, ask: "How many times today?"      No 6.  CONTACTS: "Is there anyone else in the family with the same symptoms?"      No 7. CAUSE: "What do you think is causing your vomiting?"     No 8. HYDRATION STATUS: "Any signs of dehydration?" (e.g., dry mouth [not only dry lips], too weak to stand) "When did you last urinate?"     No 9. OTHER SYMPTOMS: "Do you have any other symptoms?" (e.g., fever, headache, vertigo, vomiting blood or coffee grounds, recent head injury)     Chills  Protocols used: Vomiting-A-AH

## 2023-10-15 NOTE — ED Triage Notes (Signed)
 Pt states she has had N/V since 1500 today, also c/o generalized abdominal pain and bloating.

## 2023-10-16 MED ORDER — ALUM & MAG HYDROXIDE-SIMETH 200-200-20 MG/5ML PO SUSP
30.0000 mL | Freq: Once | ORAL | Status: AC
  Administered 2023-10-16: 30 mL via ORAL
  Filled 2023-10-16: qty 30

## 2023-10-16 MED ORDER — LIDOCAINE VISCOUS HCL 2 % MT SOLN
15.0000 mL | Freq: Once | OROMUCOSAL | Status: AC
  Administered 2023-10-16: 15 mL via ORAL
  Filled 2023-10-16: qty 15

## 2023-10-16 MED ORDER — PANTOPRAZOLE SODIUM 40 MG PO TBEC: 40.0000 mg | DELAYED_RELEASE_TABLET | Freq: Every day | ORAL | 0 refills | Status: DC

## 2023-10-16 MED ORDER — ONDANSETRON 4 MG PO TBDP: 4.0000 mg | ORAL_TABLET | Freq: Three times a day (TID) | ORAL | 0 refills | Status: DC | PRN

## 2023-10-16 NOTE — Telephone Encounter (Signed)
 Noted.

## 2023-10-16 NOTE — ED Provider Notes (Signed)
 [place] EMERGENCY DEPARTMENT AT [facility] Provider Note   CSN: [id number] Arrival date & time: 10/15/23  1932     History  Chief Complaint  Patient presents with   Abdominal Pain    [name] is a 53 y.o. female.  65-year-old female presents ER today secondary to abdominal pain.  Mostly epigastric in nature.  States that she has some nausea and vomiting with it.  No fevers.  Says she has a history of Crohn's disease but she did not tolerate medications for it so is currently untreated.  No bloody stools.  No urinary symptoms.  No constipation.  No suspicious food intake.   Abdominal Pain      Home Medications Prior to Admission medications   Medication Sig Start Date End Date Taking? Authorizing Provider  budesonide (ENTOCORT EC) 3 MG 24 hr capsule TAKE 3 CAPSULES BY MOUTH DAILY FOR 28 DAYS, THEN 2 CAPS DAILY X28 DAYS, THEN 1 CAP DAILY X28 DAYS. 12/10/22   Carlan, Chelsea L, NP  diltiazem (CARDIZEM CD) 120 MG 24 hr capsule Take 1 capsule (120 mg total) by mouth daily. 07/30/23   Kerri Perches, MD  hydrochlorothiazide (HYDRODIURIL) 25 MG tablet Take 1 tablet (25 mg total) by mouth daily. 07/30/23   Kerri Perches, MD  metoprolol tartrate (LOPRESSOR) 25 MG tablet TAKE 1/2 TABLET BY MOUTH TWICE DAILY 09/03/23   Kerri Perches, MD  potassium chloride SA (KLOR-CON M20) 20 MEQ tablet TAKE 2 TABLETS BY MOUTH TWICE A DAY 07/30/23   Kerri Perches, MD      Allergies    Biaxin [clarithromycin], Iodinated contrast media, Amoxicillin-pot clavulanate, Depo-medrol [methylprednisolone], Shellfish allergy, Yellow jacket venom [bee venom], and Peanut-containing drug products    Review of Systems   Review of Systems  Gastrointestinal:  Positive for abdominal pain.    Physical Exam Updated Vital Signs BP 129/74   Pulse 96   Temp 98.1 F (36.7 C) (Oral)   Resp 15   SpO2 96%  Physical Exam Vitals and nursing note reviewed.  Constitutional:       Appearance: She is well-developed.  HENT:     Head: Normocephalic and atraumatic.  Cardiovascular:     Rate and Rhythm: Normal rate and regular rhythm.  Pulmonary:     Effort: No respiratory distress.     Breath sounds: No stridor.  Abdominal:     General: There is no distension.     Tenderness: There is abdominal tenderness in the epigastric area.  Musculoskeletal:     Cervical back: Normal range of motion.  Neurological:     Mental Status: She is alert.     ED Results / Procedures / Treatments   Labs (all labs ordered are listed, but only abnormal results are displayed) Labs Reviewed  LIPASE, BLOOD - Abnormal; Notable for the following components:      Result Value   Lipase 53 (*)    All other components within normal limits  COMPREHENSIVE METABOLIC PANEL WITH GFR - Abnormal; Notable for the following components:   Potassium 3.1 (*)    Glucose, Bld 101 (*)    All other components within normal limits  CBC - Abnormal; Notable for the following components:   WBC 20.2 (*)    RBC 5.86 (*)    MCV 70.8 (*)    MCH 22.4 (*)    RDW 17.2 (*)    All other components within normal limits  URINALYSIS, ROUTINE W REFLEX MICROSCOPIC - Abnormal;  Notable for the following components:   Specific Gravity, Urine 1.032 (*)    Protein, ur 100 (*)    Leukocytes,Ua TRACE (*)    All other components within normal limits  PREGNANCY, URINE    EKG None  Radiology CT Renal Stone Study Result Date: 10/16/2023 CLINICAL DATA:  Generalized abdominal pain with nausea and vomiting. EXAM: CT ABDOMEN AND PELVIS WITHOUT CONTRAST TECHNIQUE: Multidetector CT imaging of the abdomen and pelvis was performed following the standard protocol without IV contrast. RADIATION DOSE REDUCTION: This exam was performed according to the departmental dose-optimization program which includes automated exposure control, adjustment of the mA and/or kV according to patient size and/or use of iterative reconstruction  technique. COMPARISON:  October 20, 2022 FINDINGS: Lower chest: Very small bilateral pleural effusions are seen. Hepatobiliary: No focal liver abnormality is seen. No gallstones, gallbladder wall thickening, or biliary dilatation. Pancreas: Stable 3 mm and 4 mm foci of parenchymal low attenuation are seen within the body of the pancreas (approximately -33.08 Hounsfield units). No pancreatic ductal dilatation or surrounding inflammatory changes. Spleen: Normal in size without focal abnormality. Adrenals/Urinary Tract: Adrenal glands are unremarkable. Kidneys are normal, without renal calculi, focal lesion, or hydronephrosis. The urinary bladder is poorly distended and subsequently limited in evaluation. Stomach/Bowel: There is a small hiatal hernia. Appendix appears normal. No evidence of bowel wall thickening, distention, or inflammatory changes. Vascular/Lymphatic: No significant vascular findings are present. Subcentimeter aortocaval lymph nodes are seen. Reproductive: Noncalcified heterogeneous uterine fibroids are suspected within an enlarged, lobulated uterus. The bilateral adnexa are unremarkable. Other: No abdominal wall hernia or abnormality. No abdominopelvic ascites. Musculoskeletal: No acute or significant osseous findings. IMPRESSION: 1. Very small bilateral pleural effusions. 2. Small hiatal hernia. 3. Stable 3 mm and 4 mm foci of parenchymal low attenuation within the body of the pancreas, likely consistent with small lipomas. 4. Suspected uterine fibroids. Correlation with nonemergent pelvic ultrasound is recommended. Electronically Signed   By: Aram Candela M.D.   On: 10/16/2023 00:54    Procedures Procedures    Medications Ordered in ED Medications  ondansetron (ZOFRAN-ODT) disintegrating tablet 4 mg (4 mg Oral Given 10/15/23 1956)  lactated ringers bolus 1,000 mL (0 mLs Intravenous Stopped 10/16/23 0142)  acetaminophen (TYLENOL) tablet 1,000 mg (1,000 mg Oral Given 10/15/23 2348)  alum  & mag hydroxide-simeth (MAALOX/MYLANTA) 200-200-20 MG/5ML suspension 30 mL (30 mLs Oral Given 10/16/23 0137)    And  lidocaine (XYLOCAINE) 2 % viscous mouth solution 15 mL (15 mLs Oral Given 10/16/23 0137)    ED Course/ Medical Decision Making/ A&P                                 Medical Decision Making Amount and/or Complexity of Data Reviewed Labs: ordered. Radiology: ordered.  Risk OTC drugs. Prescription drug management.  Secondary to leukocytosis and mild tachycardia with history of Crohn's disease will need to evaluate for abscess/other complications of Crohn's.  Patient has a listed allergy to contrast and refuses steroids as she states that she had allergic reaction to that really bad to.  I discussed that but CT without contrast is not as sensitive for GI pathology but elects to go ahead with the stone study.  Ct without contrast with multiple incidental findings of which patient made aware (already knew about all aside from pancreatic lesions) for follow up.   Suspect her symptoms are more likely related to the hiatal hernia/esophagitis/GERD than it  is crohns although ct done without contrast. Will treat with same. PCP follow up for improvement, recheck wbc and and possible further evaluation for pancreatic lesions.   Final Clinical Impression(s) / ED Diagnoses Final diagnoses:  Leukocytosis, unspecified type  Pancreatic lesion  Hiatal hernia  Fibroid    Rx / DC Orders ED Discharge Orders     None         Columbia Pandey, Barbara Cower, MD 10/16/23 [phone]

## 2023-10-20 ENCOUNTER — Telehealth: Payer: Self-pay | Admitting: Family Medicine

## 2023-10-20 NOTE — Telephone Encounter (Signed)
 Patient scheduled 4/17 at 9:20 with Rice Chamorro.

## 2023-10-20 NOTE — Telephone Encounter (Signed)
 Copied from CRM [id number]. Topic: Appointments - Scheduling Inquiry for Clinic >> Oct 20, 2023  9:17 AM Juluis Ok wrote: Reason for CRM: Patient calling to schedule ER followup. Provider's schedule shows no availability until June 13. Please contact patient for scheduling.

## 2023-10-22 ENCOUNTER — Ambulatory Visit (INDEPENDENT_AMBULATORY_CARE_PROVIDER_SITE_OTHER)

## 2023-10-22 VITALS — BP 135/83 | HR 71 | Ht 66.0 in | Wt 175.0 lb

## 2023-10-22 DIAGNOSIS — D219 Benign neoplasm of connective and other soft tissue, unspecified: Secondary | ICD-10-CM

## 2023-10-22 DIAGNOSIS — D72829 Elevated white blood cell count, unspecified: Secondary | ICD-10-CM

## 2023-10-22 DIAGNOSIS — K449 Diaphragmatic hernia without obstruction or gangrene: Secondary | ICD-10-CM | POA: Insufficient documentation

## 2023-10-22 DIAGNOSIS — K869 Disease of pancreas, unspecified: Secondary | ICD-10-CM

## 2023-10-22 NOTE — Progress Notes (Signed)
   Acute Office Visit  Subjective:     Patient ID: [name], female    DOB: 08-08-1970, 53 y.o.   MRN: [id number]  Chief Complaint  Patient presents with   Hospitalization Follow-up    Pt states "went to ED throwing up everywhere and they said it was a chrons flare up"    HPI  Patient is in today for ER f/u on 10/15/23 for epigastric abdominal pain, and mild N/V.  Has hx of Crohn's dx. Denies bloody stools, constipation or diarrhea.    ROS      Objective:    BP 135/83   Pulse 71   Ht 5\' 6"  (1.676 m)   Wt 175 lb (79.4 kg)   SpO2 98%   BMI 28.25 kg/m    Physical Exam Vitals and nursing note reviewed.  Constitutional:      Appearance: Normal appearance.  Eyes:     Extraocular Movements: Extraocular movements intact.     Pupils: Pupils are equal, round, and reactive to light.  Cardiovascular:     Rate and Rhythm: Normal rate and regular rhythm.  Pulmonary:     Effort: Pulmonary effort is normal.     Breath sounds: Normal breath sounds.  Abdominal:     General: Abdomen is flat. Bowel sounds are decreased.     Palpations: Abdomen is soft.     Tenderness: There is abdominal tenderness in the suprapubic area. There is no right CVA tenderness, left CVA tenderness, guarding or rebound.     Hernia: No hernia is present.  Neurological:     Mental Status: She is alert and oriented to person, place, and time.  Psychiatric:        Mood and Affect: Mood normal.        Thought Content: Thought content normal.         Assessment & Plan:   Problem List Items Addressed This Visit       Respiratory   Hiatal hernia   Advised to continue with pantoprazole  as directed for treatment of hiatal hernia symptoms. Discussed administration instructions.   Recommend GI consult if no improvement.         Other   Leukocytosis - Primary   Chronic in nature. Will recheck CBC to ensure trending back down. She has had a previous hematology consult for this.         Relevant  Orders   CBC with Differential/Platelet (Completed)   Fibroid   Obtain pelvic ultrasound for further evaluation as recommended in recent CT report on 10/15/23.       Relevant Orders   US  PELVIC COMPLETE WITH TRANSVAGINAL    No orders of the defined types were placed in this encounter.   No follow-ups on file.  Alison Irvine, FNP

## 2023-10-23 LAB — CBC WITH DIFFERENTIAL/PLATELET
Basophils Absolute: 0.1 10*3/uL (ref 0.0–0.2)
Basos: 1 %
EOS (ABSOLUTE): 0.1 10*3/uL (ref 0.0–0.4)
Eos: 1 %
Hematocrit: 41.8 % (ref 34.0–46.6)
Hemoglobin: 13.1 g/dL (ref 11.1–15.9)
Immature Grans (Abs): 0 10*3/uL (ref 0.0–0.1)
Immature Granulocytes: 0 %
Lymphocytes Absolute: 1.9 10*3/uL (ref 0.7–3.1)
Lymphs: 20 %
MCH: 22.5 pg — ABNORMAL LOW (ref 26.6–33.0)
MCHC: 31.3 g/dL — ABNORMAL LOW (ref 31.5–35.7)
MCV: 72 fL — ABNORMAL LOW (ref 79–97)
Monocytes Absolute: 0.6 10*3/uL (ref 0.1–0.9)
Monocytes: 7 %
Neutrophils Absolute: 6.7 10*3/uL (ref 1.4–7.0)
Neutrophils: 71 %
Platelets: 409 10*3/uL (ref 150–450)
RBC: 5.81 x10E6/uL — ABNORMAL HIGH (ref 3.77–5.28)
RDW: 16.1 % — ABNORMAL HIGH (ref 11.7–15.4)
WBC: 9.3 10*3/uL (ref 3.4–10.8)

## 2023-10-23 NOTE — Assessment & Plan Note (Signed)
 Advised to continue with pantoprazole  as directed for treatment of hiatal hernia symptoms. Discussed administration instructions.   Recommend GI consult if no improvement.

## 2023-10-23 NOTE — Assessment & Plan Note (Addendum)
 Chronic in nature. Will recheck CBC to ensure trending back down. She has had a previous hematology consult for this.

## 2023-10-23 NOTE — Assessment & Plan Note (Signed)
 Obtain pelvic ultrasound for further evaluation as recommended in recent CT report on 10/15/23.

## 2023-11-05 ENCOUNTER — Ambulatory Visit (HOSPITAL_COMMUNITY)

## 2023-11-06 ENCOUNTER — Ambulatory Visit (HOSPITAL_COMMUNITY)
Admission: RE | Admit: 2023-11-06 | Discharge: 2023-11-06 | Disposition: A | Discharge status: Home / Self Care | Source: Ambulatory Visit

## 2023-11-06 DIAGNOSIS — D219 Benign neoplasm of connective and other soft tissue, unspecified: Secondary | ICD-10-CM | POA: Insufficient documentation

## 2023-11-18 ENCOUNTER — Telehealth: Payer: Self-pay

## 2023-11-18 NOTE — Telephone Encounter (Signed)
 Copied from CRM [id number]. Topic: Clinical - Lab/Test Results >> Nov 18, 2023  1:54 PM [name] wrote: Reason for CRM: Patient requesting a call w/ ultrasound results as soon as they are available.

## 2023-11-18 NOTE — Telephone Encounter (Signed)
 Waiting on provider to review. Will contact pt when resulted

## 2023-11-19 ENCOUNTER — Ambulatory Visit: Payer: Self-pay

## 2023-12-01 ENCOUNTER — Ambulatory Visit: Admitting: Adult Health

## 2023-12-29 ENCOUNTER — Ambulatory Visit (INDEPENDENT_AMBULATORY_CARE_PROVIDER_SITE_OTHER): Payer: 59 | Admitting: Family Medicine

## 2023-12-29 ENCOUNTER — Ambulatory Visit: Payer: Self-pay

## 2023-12-29 ENCOUNTER — Encounter: Payer: Self-pay | Admitting: Family Medicine

## 2023-12-29 VITALS — BP 130/80 | HR 78 | Resp 16 | Ht 66.5 in | Wt 174.1 lb

## 2023-12-29 DIAGNOSIS — I10 Essential (primary) hypertension: Secondary | ICD-10-CM | POA: Diagnosis not present

## 2023-12-29 DIAGNOSIS — R7303 Prediabetes: Secondary | ICD-10-CM

## 2023-12-29 DIAGNOSIS — E559 Vitamin D deficiency, unspecified: Secondary | ICD-10-CM

## 2023-12-29 DIAGNOSIS — E785 Hyperlipidemia, unspecified: Secondary | ICD-10-CM

## 2023-12-29 DIAGNOSIS — E663 Overweight: Secondary | ICD-10-CM

## 2023-12-29 DIAGNOSIS — Z01419 Encounter for gynecological examination (general) (routine) without abnormal findings: Secondary | ICD-10-CM

## 2023-12-29 DIAGNOSIS — M79604 Pain in right leg: Secondary | ICD-10-CM

## 2023-12-29 NOTE — Assessment & Plan Note (Signed)
 Acute onset of RLE pain and swelling affecting right thigh to knee, eval for dVT

## 2023-12-29 NOTE — Patient Instructions (Addendum)
 F/U in in 4 months, call if you need me sooner  You are referred for an ultrasound of your right leg due to pain and swelling  Fasting labs this week please lipid, cmp and EGFR, hBA1C, TSH and vit D  Please schedule your physical with pap due in December  Let me kn ow when you want Ortho to re evaluate your right knee and right hip as well as lower back  It is important that you exercise regularly at least 30 minutes 5 times a week. If you develop chest pain, have severe difficulty breathing, or feel very tired, stop exercising immediately and seek medical attention   Thanks for choosing [facility], we consider it a privelige to serve you.

## 2023-12-29 NOTE — Telephone Encounter (Signed)
 FYI Only or Action Required?: FYI only for provider.  Patient was last seen in primary care on 10/22/2023 by Bevely Doffing, FNP. Called Nurse Triage reporting Numbness. Symptoms began several days ago. Interventions attempted: Nothing. Symptoms are: gradually worsening.  Triage Disposition: See PCP When Office is Open (Within 3 Days)  Patient/caregiver understands and will follow disposition?: Yes          Copied from CRM [id number]. Topic: Clinical - Red Word Triage >> Dec 29, 2023  8:54 AM Carlatta H wrote: [facility] that prompted transfer to Nurse Triage: Patients right leg is swollen and numb Reason for Disposition  [1] Numbness or tingling in one or both feet AND [2] is a chronic symptom (recurrent or ongoing AND present > 4 weeks)  Answer Assessment - Initial Assessment Questions 1. SYMPTOM: What is the main symptom you are concerned about? (e.g., weakness, numbness)     Numbness/swelling right leg in thigh 2. ONSET: When did this start? (minutes, hours, days; while sleeping)     X 3 days, worsening 3. LAST NORMAL: When was the last time you (the patient) were normal (no symptoms)?    X 3 days ago 4. PATTERN Does this come and go, or has it been constant since it started?  Is it present now?     Constant now 5. CARDIAC SYMPTOMS: Have you had any of the following symptoms: chest pain, difficulty breathing, palpitations?     None 6. NEUROLOGIC SYMPTOMS: Have you had any of the following symptoms: headache, dizziness, vision loss, double vision, changes in speech, unsteady on your feet?     HA this AM 7. OTHER SYMPTOMS: Do you have any other symptoms?     None  Protocols used: Neurologic Deficit-A-AH

## 2023-12-30 ENCOUNTER — Ambulatory Visit (HOSPITAL_COMMUNITY)
Admission: RE | Admit: 2023-12-30 | Discharge: 2023-12-30 | Disposition: A | Discharge status: Home / Self Care | Source: Ambulatory Visit | Attending: Family Medicine | Admitting: Family Medicine

## 2023-12-30 ENCOUNTER — Ambulatory Visit: Payer: Self-pay | Admitting: Family Medicine

## 2023-12-30 DIAGNOSIS — M79604 Pain in right leg: Secondary | ICD-10-CM | POA: Insufficient documentation

## 2024-01-04 ENCOUNTER — Encounter: Payer: Self-pay | Admitting: Family Medicine

## 2024-01-04 DIAGNOSIS — Z01419 Encounter for gynecological examination (general) (routine) without abnormal findings: Secondary | ICD-10-CM | POA: Insufficient documentation

## 2024-01-04 NOTE — Progress Notes (Signed)
 [name]     MRN: [id number]      DOB: 1971/03/28  Chief Complaint  Patient presents with   Hypertension    5 month follow up    Leg Swelling    Complains of swelling in her right thigh and hip x3 days. Very tight. Not red or hot to the touch     HPI [name] is here for follow up and re-evaluation of chronic medical conditions, medication management and review of any available recent lab and radiology data.  Preventive health is updated, specifically  Cancer screening and Immunization.   Main concern is the pain and swelling of right thigh, worried about possible clot in the leg, denies shortness of breath, coughor hemoptysis, no prior episode of DVT. C/o low back , right knee and right hip pain , has  been evaluated in the past, holding of on re evaluation at this time ROS Denies recent fever or chills. Denies sinus pressure, nasal congestion, ear pain or sore throat. Denies chest congestion, productive cough or wheezing. Denies chest pains, palpitations and leg swelling Denies abdominal pain, nausea, vomiting,diarrhea or constipation.   Denies dysuria, frequency, hesitancy or incontinence.  Denies headaches, seizures, numbness, or tingling. Denies depression, anxiety or insomnia. Denies skin break down or rash.   PE  BP 130/80   Pulse 78   Resp 16   Ht 5' 6.5 (1.689 m)   Wt 174 lb 1.3 oz (79 kg)   SpO2 97%   BMI 27.68 kg/m   Patient alert and oriented and in no cardiopulmonary distress.  HEENT: No facial asymmetry, EOMI,     Neck supple .  Chest: Clear to auscultation bilaterally.  CVS: S1, S2 no murmurs, no S3.Regular rate.  r.   Ext: No edema  MS: Adequate ROM spine, shoulders, hips and knees. Right thigh mildly swollen, no erythema or warmth  Skin: Intact, no ulcerations or rash noted.  Psych: Good eye contact, normal affect. Memory intact not anxious or depressed appearing.  CNS: CN 2-12 intact, power,  normal throughout.no focal deficits  noted.   Assessment & Plan  Leg pain, diffuse, right Acute onset of RLE pain and swelling affecting right thigh to knee, eval for dVT  Well woman exam with routine gynecological exam Referral entered , pt to schedule , pap neds o be done before 2026  Vitamin D  deficiency Updated lab needed at/ before next visit.   Prediabetes Patient educated about the importance of limiting  Carbohydrate intake , the need to commit to daily physical activity for a minimum of 30 minutes , and to commit weight loss. The fact that changes in all these areas will reduce or eliminate all together the development of diabetes is stressed.      Latest Ref Rng & Units 10/15/2023    8:11 PM 11/21/2022    1:32 PM 11/17/2022   11:00 AM 10/20/2022   10:45 PM 06/18/2022   10:09 AM  Diabetic Labs  Creatinine 0.44 - 1.00 mg/dL 9.11  8.99  9.06  9.02  0.93       12/29/2023    2:32 PM 12/29/2023    2:10 PM 10/22/2023    9:41 AM 10/16/2023    2:15 AM 10/16/2023    2:00 AM 10/16/2023    1:45 AM 10/16/2023    1:30 AM  BP/Weight  Systolic BP 130 146 135 130 108 127 129  Diastolic BP 80 82 83 82 83 79 74  Wt. (Lbs)  174.08  175      BMI  27.68 kg/m2 28.25 kg/m2           No data to display          Updated lab needed at/ before next visit.;l  Overweight (BMI 25.0-29.9)  Patient re-educated about  the importance of commitment to a  minimum of 150 minutes of exercise per week as able.  The importance of healthy food choices with portion control discussed, as well as eating regularly and within a 12 hour window most days. The need to choose clean , green food 50 to 75% of the time is discussed, as well as to make water the primary drink and set a goal of 64 ounces water daily.       12/29/2023    2:10 PM 10/22/2023    9:41 AM 08/14/2023    9:45 AM  Weight /BMI  Weight 174 lb 1.3 oz 175 lb 177 lb 1.9 oz  Height 5' 6.5 (1.689 m) 5' 6 (1.676 m) 5' 6 (1.676 m)  BMI 27.68 kg/m2 28.25 kg/m2 28.59 kg/m2     unchanged  Essential hypertension Controlled, no change in medication DASH diet and commitment to daily physical activity for a minimum of 30 minutes discussed and encouraged, as a part of hypertension management. The importance of attaining a healthy weight is also discussed.     12/29/2023    2:32 PM 12/29/2023    2:10 PM 10/22/2023    9:41 AM 10/16/2023    2:15 AM 10/16/2023    2:00 AM 10/16/2023    1:45 AM 10/16/2023    1:30 AM  BP/Weight  Systolic BP 130 146 135 130 108 127 129  Diastolic BP 80 82 83 82 83 79 74  Wt. (Lbs)  174.08 175      BMI  27.68 kg/m2 28.25 kg/m2           Dyslipidemia Hyperlipidemia:Low fat diet discussed and encouraged.   Lipid Panel  Lab Results  Component Value Date   CHOL 154 09/10/2021   HDL 39 (L) 09/10/2021   LDLCALC 98 09/10/2021   TRIG 90 09/10/2021   CHOLHDL 3.9 09/10/2021     Updated lab needed at/ before next visit.

## 2024-01-04 NOTE — Assessment & Plan Note (Signed)
 Referral entered , pt to schedule , pap neds o be done before 2026

## 2024-01-04 NOTE — Assessment & Plan Note (Signed)
 Patient educated about the importance of limiting  Carbohydrate intake , the need to commit to daily physical activity for a minimum of 30 minutes , and to commit weight loss. The fact that changes in all these areas will reduce or eliminate all together the development of diabetes is stressed.      Latest Ref Rng & Units 10/15/2023    8:11 PM 11/21/2022    1:32 PM 11/17/2022   11:00 AM 10/20/2022   10:45 PM 06/18/2022   10:09 AM  Diabetic Labs  Creatinine 0.44 - 1.00 mg/dL 9.11  8.99  9.06  9.02  0.93       12/29/2023    2:32 PM 12/29/2023    2:10 PM 10/22/2023    9:41 AM 10/16/2023    2:15 AM 10/16/2023    2:00 AM 10/16/2023    1:45 AM 10/16/2023    1:30 AM  BP/Weight  Systolic BP 130 146 135 130 108 127 129  Diastolic BP 80 82 83 82 83 79 74  Wt. (Lbs)  174.08 175      BMI  27.68 kg/m2 28.25 kg/m2           No data to display          Updated lab needed at/ before next visit.;l

## 2024-01-04 NOTE — Assessment & Plan Note (Signed)
 Controlled, no change in medication DASH diet and commitment to daily physical activity for a minimum of 30 minutes discussed and encouraged, as a part of hypertension management. The importance of attaining a healthy weight is also discussed.     12/29/2023    2:32 PM 12/29/2023    2:10 PM 10/22/2023    9:41 AM 10/16/2023    2:15 AM 10/16/2023    2:00 AM 10/16/2023    1:45 AM 10/16/2023    1:30 AM  BP/Weight  Systolic BP 130 146 135 130 108 127 129  Diastolic BP 80 82 83 82 83 79 74  Wt. (Lbs)  174.08 175      BMI  27.68 kg/m2 28.25 kg/m2

## 2024-01-04 NOTE — Assessment & Plan Note (Signed)
 Hyperlipidemia:Low fat diet discussed and encouraged.   Lipid Panel  Lab Results  Component Value Date   CHOL 154 09/10/2021   HDL 39 (L) 09/10/2021   LDLCALC 98 09/10/2021   TRIG 90 09/10/2021   CHOLHDL 3.9 09/10/2021     Updated lab needed at/ before next visit.

## 2024-01-04 NOTE — Assessment & Plan Note (Signed)
 Updated lab needed at/ before next visit.

## 2024-01-04 NOTE — Assessment & Plan Note (Signed)
  Patient re-educated about  the importance of commitment to a  minimum of 150 minutes of exercise per week as able.  The importance of healthy food choices with portion control discussed, as well as eating regularly and within a 12 hour window most days. The need to choose clean , green food 50 to 75% of the time is discussed, as well as to make water the primary drink and set a goal of 64 ounces water daily.       12/29/2023    2:10 PM 10/22/2023    9:41 AM 08/14/2023    9:45 AM  Weight /BMI  Weight 174 lb 1.3 oz 175 lb 177 lb 1.9 oz  Height 5' 6.5 (1.689 m) 5' 6 (1.676 m) 5' 6 (1.676 m)  BMI 27.68 kg/m2 28.25 kg/m2 28.59 kg/m2    unchanged

## 2024-01-06 IMAGING — DX DG CERVICAL SPINE COMPLETE 4+V
5 series · 5 of 5 positions shown · non-contrast
Comparison: None Available.

CLINICAL DATA: Neck pain

EXAM:
CERVICAL SPINE - COMPLETE 4+ VIEW

[c-spine lat]
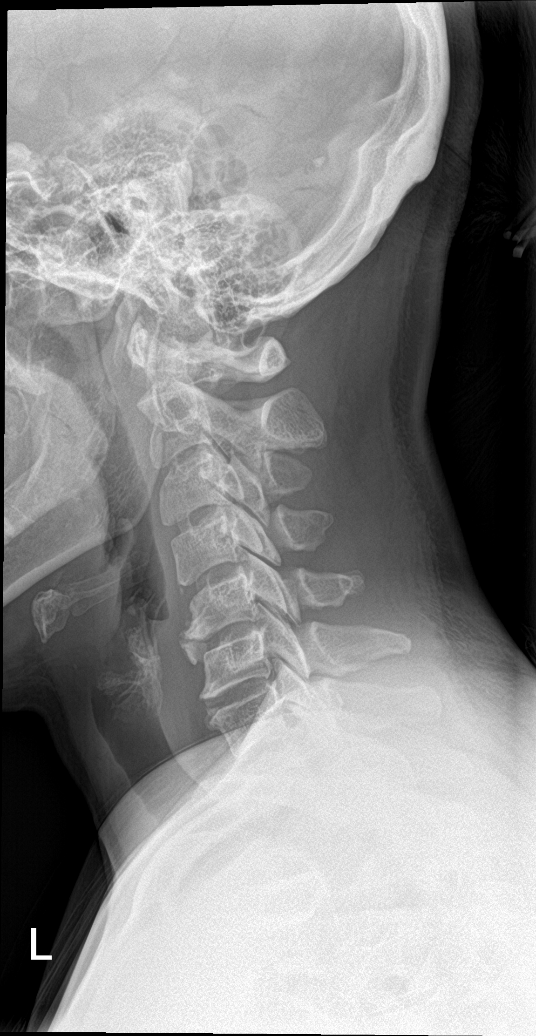

[c-spine obl (1 of 2)]
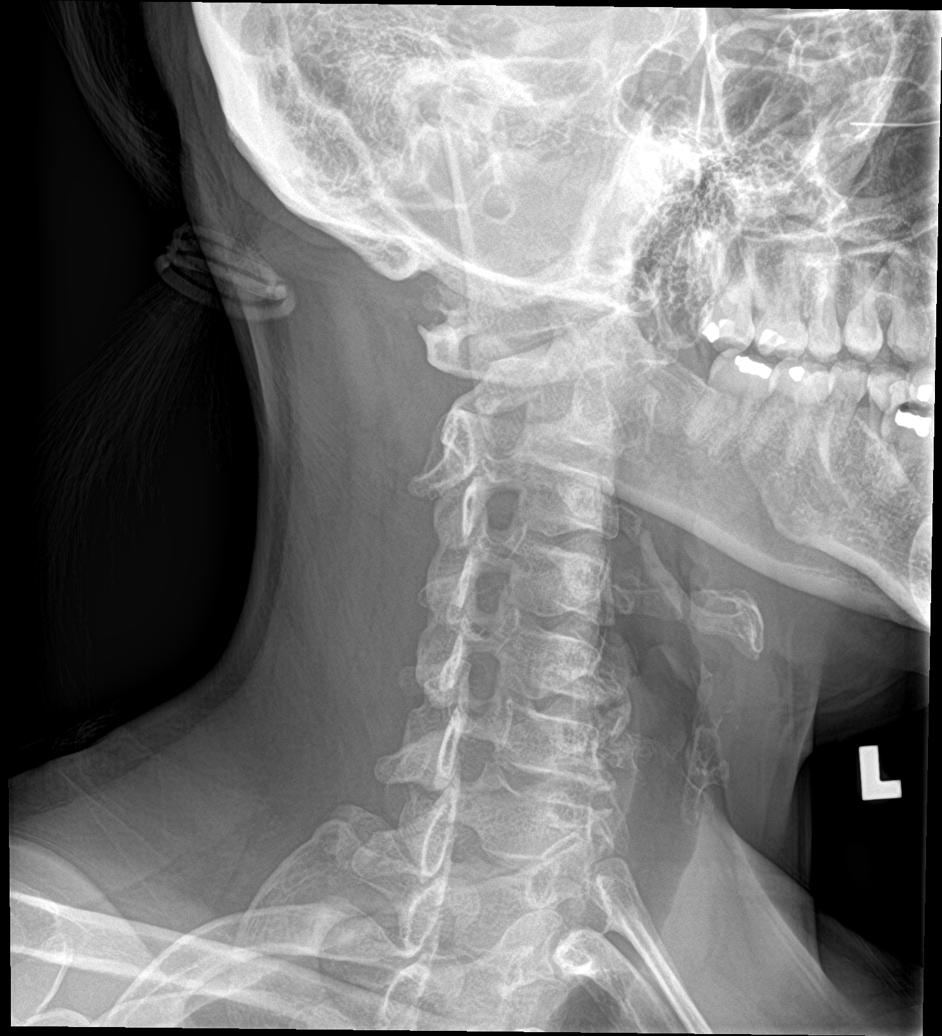

[c-spine obl (2 of 2)]
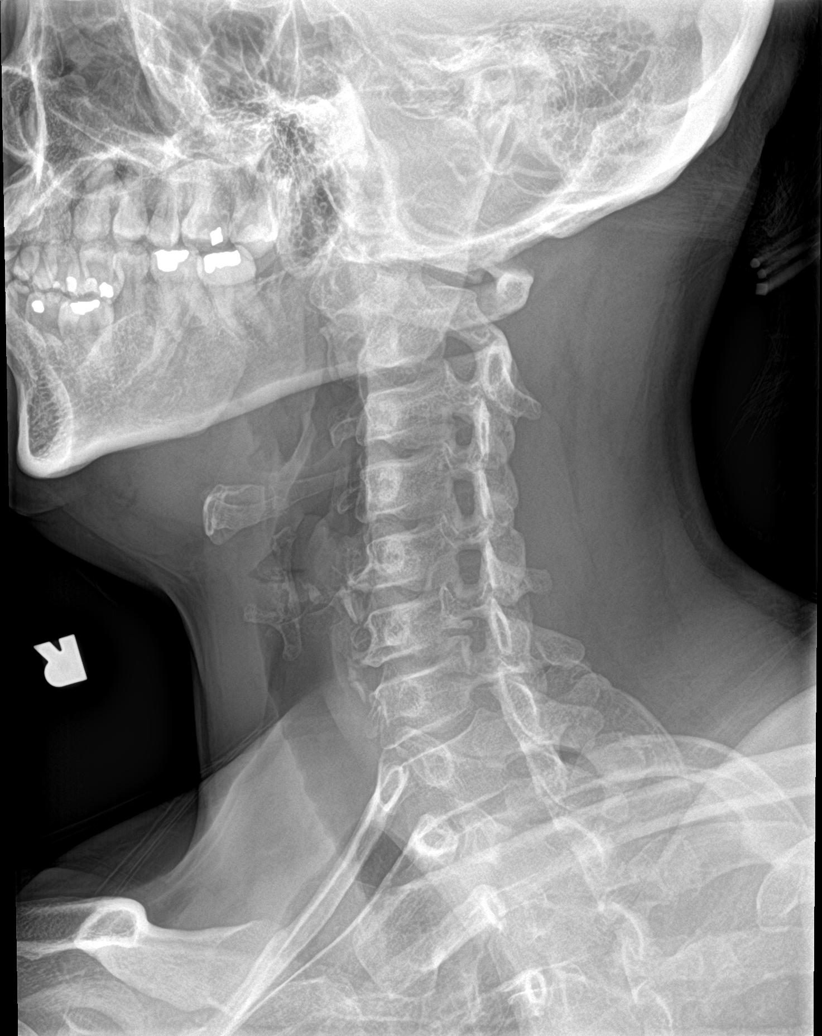

[c-spine ap]
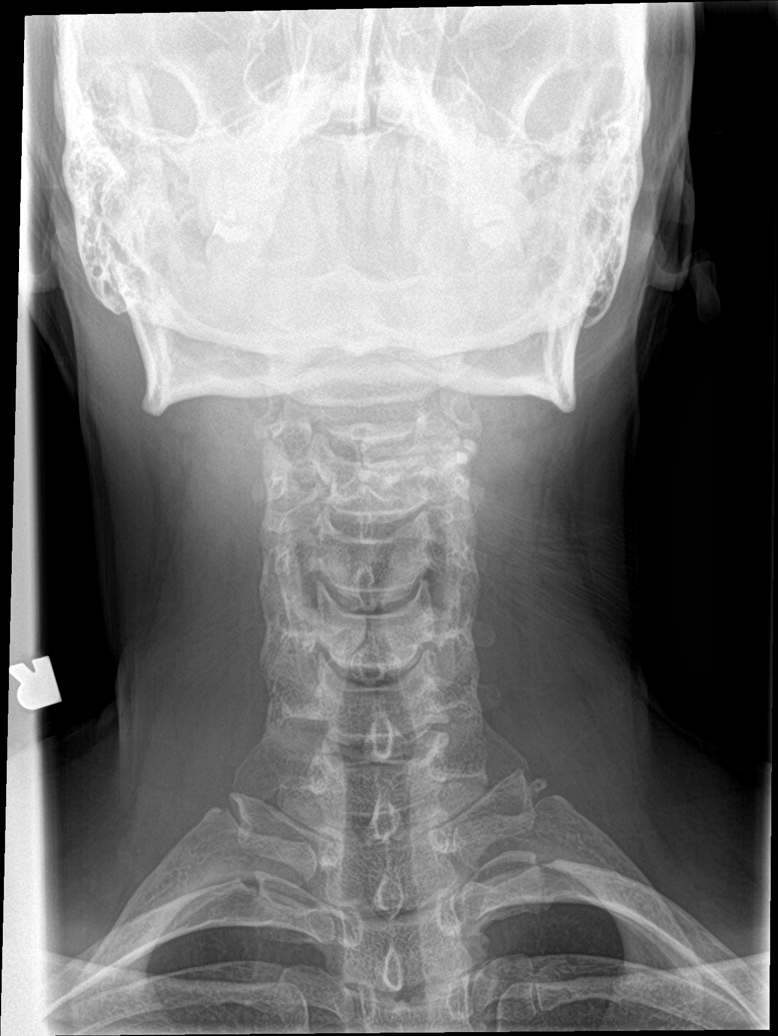

[c-spine open mouth]
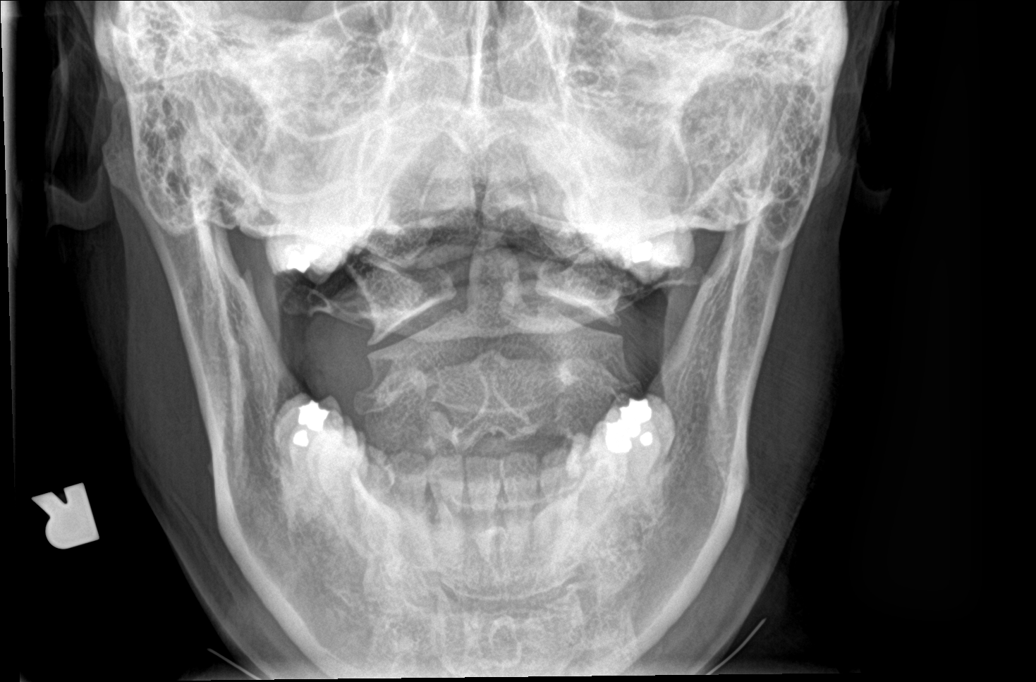

[5 of 5 positions shown; findings below may reference images not displayed]

FINDINGS: No acute fracture or malalignment identified. Intervertebral disc
spaces are preserved. Uncovertebral spurring on the left at C6-C7
causing mild left neural foraminal narrowing. No prevertebral soft
tissue swelling.
IMPRESSION: Mild degenerative changes with no acute fracture identified.

## 2024-02-24 ENCOUNTER — Other Ambulatory Visit (HOSPITAL_COMMUNITY): Payer: Self-pay

## 2024-02-24 MED ORDER — DILTIAZEM HCL ER COATED BEADS 120 MG PO CP24
120.0000 mg | ORAL_CAPSULE | Freq: Every day | ORAL | 2 refills | Status: DC
  Filled 2024-02-24 – 2024-03-01 (×3): qty 90, 90d supply, fill #0

## 2024-02-25 ENCOUNTER — Other Ambulatory Visit (HOSPITAL_COMMUNITY): Payer: Self-pay

## 2024-02-25 MED FILL — Metoprolol Tartrate Tab 25 MG: ORAL | 90 days supply | Qty: 90 | Fill #0 | Status: CN

## 2024-02-26 ENCOUNTER — Other Ambulatory Visit (HOSPITAL_COMMUNITY): Payer: Self-pay

## 2024-02-26 ENCOUNTER — Other Ambulatory Visit (HOSPITAL_BASED_OUTPATIENT_CLINIC_OR_DEPARTMENT_OTHER): Payer: Self-pay

## 2024-02-26 MED ORDER — METOPROLOL TARTRATE 25 MG PO TABS: 12.5000 mg | ORAL_TABLET | Freq: Two times a day (BID) | ORAL | 3 refills | Status: DC

## 2024-02-26 MED FILL — Metoprolol Tartrate Tab 25 MG: ORAL | 90 days supply | Qty: 90 | Fill #0 | Status: CN

## 2024-02-27 ENCOUNTER — Other Ambulatory Visit (HOSPITAL_BASED_OUTPATIENT_CLINIC_OR_DEPARTMENT_OTHER): Payer: Self-pay

## 2024-03-01 ENCOUNTER — Other Ambulatory Visit (HOSPITAL_COMMUNITY): Payer: Self-pay

## 2024-03-03 ENCOUNTER — Other Ambulatory Visit (HOSPITAL_COMMUNITY): Payer: Self-pay

## 2024-03-10 ENCOUNTER — Other Ambulatory Visit (HOSPITAL_BASED_OUTPATIENT_CLINIC_OR_DEPARTMENT_OTHER): Payer: Self-pay

## 2024-03-21 ENCOUNTER — Other Ambulatory Visit (HOSPITAL_COMMUNITY): Payer: Self-pay

## 2024-03-21 ENCOUNTER — Ambulatory Visit: Payer: Self-pay

## 2024-03-21 NOTE — Telephone Encounter (Signed)
 FYI Only or Action Required?: Action required by provider: would like a call back; declined apt for Wednesday and declined UC.  Would like a call back from PCP.  Patient was last seen in primary care on 12/29/2023 by Antonetta Rollene BRAVO, MD.  Called Nurse Triage reporting Sinusitis.  Symptoms began several days ago.  Interventions attempted: Nothing.  Symptoms are: unchanged.  Triage Disposition: See PCP When Office is Open (Within 3 Days)  Patient/caregiver understands and will follow disposition?: No, wishes to speak with PCP   Headache since Friday, congestion, sneezing, and coughing. Call back # [phone]  Reason for Disposition  [1] Sinus congestion (pressure, fullness) AND [2] present > 10 days  Answer Assessment - Initial Assessment Questions 1. LOCATION: Where does it hurt?      head 2. ONSET: When did the sinus pain start?  (e.g., hours, days)       Friday 3. SEVERITY: How bad is the pain?   (Scale 0-10; or none, mild, moderate or severe)     moderate 4. RECURRENT SYMPTOM: Have you ever had sinus problems before? If Yes, ask: When was the last time? and What happened that time?      yes 5. NASAL CONGESTION: Is the nose blocked? If Yes, ask: Can you open it or must you breathe through your mouth?     blocked 6. NASAL DISCHARGE: Do you have discharge from your nose? If so ask, What color?     na 7. FEVER: Do you have a fever? If Yes, ask: What is it, how was it measured, and when did it start?      denies 8. OTHER SYMPTOMS: Do you have any other symptoms? (e.g., sore throat, cough, earache, difficulty breathing)     Sneezing, cough, earache 9. PREGNANCY: Is there any chance you are pregnant? When was your last menstrual period?     na  Protocols used: Sinus Pain or Congestion-A-AH

## 2024-03-21 NOTE — Telephone Encounter (Signed)
 Spoke to pt, agreeable to UC

## 2024-03-23 ENCOUNTER — Other Ambulatory Visit: Payer: Self-pay | Admitting: Family Medicine

## 2024-03-24 ENCOUNTER — Other Ambulatory Visit (HOSPITAL_BASED_OUTPATIENT_CLINIC_OR_DEPARTMENT_OTHER): Payer: Self-pay

## 2024-04-20 ENCOUNTER — Encounter (INDEPENDENT_AMBULATORY_CARE_PROVIDER_SITE_OTHER): Payer: Self-pay | Admitting: Gastroenterology

## 2024-04-29 ENCOUNTER — Ambulatory Visit: Payer: Self-pay

## 2024-04-29 NOTE — Telephone Encounter (Signed)
 FYI Only or Action Required?: FYI only for provider.  Patient was last seen in primary care on 12/29/2023 by Antonetta Rollene BRAVO, MD.  Called Nurse Triage reporting Hand Pain.  Symptoms began yesterday.  Interventions attempted: Nothing.  Symptoms are: unchanged.  Triage Disposition: No disposition on file.  Patient/caregiver understands and will follow disposition?:   **Referred to UC; see note below**         Copied from CRM #[id number]. Topic: Clinical - Red Word Triage >> Apr 29, 2024  8:49 AM Charlet HERO wrote: Red Word that prompted transfer to Nurse Triage: Stopped up and sneezing and her right hand is swollen. Hands are hurting and sore. [facility]. Answer Assessment - Initial Assessment Questions 1. ONSET: When did the pain start?     X 1 day  2. LOCATION: Where is the pain located?     Right hand, swelling is mild near wrist and thumb  3. PAIN: How bad is the pain? (Scale 1-10; or mild, moderate, severe)     5/10  4. WORK OR EXERCISE: Has there been any recent work or exercise that involved this part (i.e., hand or wrist) of the body?     Yes, work   5. CAUSE: What do you think is causing the pain?     Patient stated he employment may be causing the hand pain/ swelling   6. AGGRAVATING FACTORS: What makes the pain worse? (e.g., using computer)     Movement   7. OTHER SYMPTOMS: Do you have any other symptoms? (e.g., fever, neck pain, numbness or tingling, rash, swelling)  Swelling noted  Nasal congestion noted x 1 day. For the pain in her hand she has not taken any medications OTC, but has Tylenol  on hand. Patient offered soonest available appt per Epic for 10/27; she would like Dr. Antonetta to give her a call to discuss concerns. Pt. Referred to UC; she agrees with plan of care as she wishes to be seen today.  Protocols used: Hand Pain-A-AH

## 2024-04-29 NOTE — Telephone Encounter (Signed)
 Called patient will wait til 11/4 to be seen

## 2024-05-11 ENCOUNTER — Encounter: Payer: Self-pay | Admitting: Family Medicine

## 2024-05-11 ENCOUNTER — Ambulatory Visit: Admitting: Family Medicine

## 2024-05-11 VITALS — BP 135/80 | HR 68 | Resp 18 | Ht 67.0 in | Wt 163.1 lb

## 2024-05-11 DIAGNOSIS — M05772 Rheumatoid arthritis with rheumatoid factor of left ankle and foot without organ or systems involvement: Secondary | ICD-10-CM

## 2024-05-11 DIAGNOSIS — M549 Dorsalgia, unspecified: Secondary | ICD-10-CM | POA: Diagnosis not present

## 2024-05-11 DIAGNOSIS — M05771 Rheumatoid arthritis with rheumatoid factor of right ankle and foot without organ or systems involvement: Secondary | ICD-10-CM

## 2024-05-11 DIAGNOSIS — R300 Dysuria: Secondary | ICD-10-CM

## 2024-05-11 DIAGNOSIS — I10 Essential (primary) hypertension: Secondary | ICD-10-CM

## 2024-05-11 DIAGNOSIS — R350 Frequency of micturition: Secondary | ICD-10-CM

## 2024-05-11 NOTE — Patient Instructions (Addendum)
 F/U in 5 months, call if you n eed me sooner  You are referred tro dr deveshwar you NEED medication for rheumatoid arthritis to feel and function better  Labs today  Pls schedule mammogram at checkout due 09/2024   Reconsider vaccines pls  It is important that you exercise regularly at least 30 minutes 5 times a week. If you develop chest pain, have severe difficulty breathing, or feel very tired, stop exercising immediately and seek medical attention   Think about what you will eat, plan ahead. Choose  clean, green, fresh or frozen over canned, processed or packaged foods which are more sugary, salty and fatty. 70 to 75% of food eaten should be vegetables and fruit. Three meals at set times with snacks allowed between meals, but they must be fruit or vegetables. Aim to eat over a 12 hour period , example 7 am to 7 pm, and STOP after  your last meal of the day. Drink water,generally about 64 ounces per day, no other drink is as healthy. Fruit juice is best enjoyed in a healthy way, by EATING the fruit.  Thanks for choosing [facility], we consider it a privelige to serve you.

## 2024-05-12 ENCOUNTER — Ambulatory Visit: Payer: Self-pay | Admitting: Family Medicine

## 2024-05-12 ENCOUNTER — Other Ambulatory Visit (HOSPITAL_COMMUNITY): Payer: Self-pay | Admitting: Family Medicine

## 2024-05-12 ENCOUNTER — Telehealth: Payer: Self-pay

## 2024-05-12 DIAGNOSIS — Z1231 Encounter for screening mammogram for malignant neoplasm of breast: Secondary | ICD-10-CM

## 2024-05-12 LAB — VITAMIN D 25 HYDROXY (VIT D DEFICIENCY, FRACTURES): Vit D, 25-Hydroxy: 11.5 ng/mL — ABNORMAL LOW (ref 30.0–100.0)

## 2024-05-12 LAB — CMP14+EGFR
ALT: 14 IU/L (ref 0–32)
AST: 18 IU/L (ref 0–40)
Albumin: 4.6 g/dL (ref 3.8–4.9)
Alkaline Phosphatase: 96 IU/L (ref 49–135)
BUN/Creatinine Ratio: 15 (ref 9–23)
BUN: 15 mg/dL (ref 6–24)
Bilirubin Total: 0.4 mg/dL (ref 0.0–1.2)
CO2: 22 mmol/L (ref 20–29)
Calcium: 9.5 mg/dL (ref 8.7–10.2)
Chloride: 103 mmol/L (ref 96–106)
Creatinine, Ser: 1.03 mg/dL — ABNORMAL HIGH (ref 0.57–1.00)
Globulin, Total: 3.2 g/dL (ref 1.5–4.5)
Glucose: 67 mg/dL — ABNORMAL LOW (ref 70–99)
Potassium: 3.3 mmol/L — ABNORMAL LOW (ref 3.5–5.2)
Sodium: 141 mmol/L (ref 134–144)
Total Protein: 7.8 g/dL (ref 6.0–8.5)
eGFR: 65 mL/min/1.73 (ref >59)

## 2024-05-12 LAB — LIPID PANEL
Chol/HDL Ratio: 3.6 ratio (ref 0.0–4.4)
Cholesterol, Total: 153 mg/dL (ref 100–199)
HDL: 43 mg/dL (ref >39)
LDL Chol Calc (NIH): 95 mg/dL (ref 0–99)
Triglycerides: 77 mg/dL (ref 0–149)
VLDL Cholesterol Cal: 15 mg/dL (ref 5–40)

## 2024-05-12 LAB — TSH: TSH: 2.49 u[IU]/mL (ref 0.450–4.500)

## 2024-05-12 LAB — HEMOGLOBIN A1C
Est. average glucose Bld gHb Est-mCnc: 114 mg/dL
Hgb A1c MFr Bld: 5.6 % (ref 4.8–5.6)

## 2024-05-12 MED ORDER — POTASSIUM CHLORIDE CRYS ER 20 MEQ PO TBCR: EXTENDED_RELEASE_TABLET | ORAL | 5 refills | Status: AC

## 2024-05-12 MED ORDER — VITAMIN D (ERGOCALCIFEROL) 1.25 MG (50000 UNIT) PO CAPS: 50000.0000 [IU] | ORAL_CAPSULE | ORAL | 3 refills | Status: AC

## 2024-05-12 NOTE — Telephone Encounter (Signed)
 Copied from CRM #[id number]. Topic: Clinical - Lab/Test Results >> May 12, 2024  3:41 PM Hadassah PARAS wrote: Reason for CRM: Pt is calling to obtain urine results. Please advise [phone]

## 2024-05-12 NOTE — Addendum Note (Signed)
 Addended by: ANTONETTA ROLLENE BRAVO on: 05/12/2024 04:59 PM   Modules accepted: Orders

## 2024-05-12 NOTE — Telephone Encounter (Signed)
Awaiting urine culture results.

## 2024-05-17 LAB — UA/M W/RFLX CULTURE, ROUTINE
Bilirubin, UA: NEGATIVE
Glucose, UA: NEGATIVE
Nitrite, UA: NEGATIVE
RBC, UA: NEGATIVE
Specific Gravity, UA: >=1.03 — AB (ref 1.005–1.030)
Urobilinogen, Ur: 0.2 mg/dL (ref 0.2–1.0)
pH, UA: 5.5 (ref 5.0–7.5)

## 2024-05-17 LAB — URINE CULTURE, REFLEX

## 2024-05-17 LAB — MICROSCOPIC EXAMINATION
Casts: NONE SEEN /LPF
Epithelial Cells (non renal): >10 /HPF — AB (ref 0–10)
RBC, Urine: NONE SEEN /HPF (ref 0–2)

## 2024-05-19 ENCOUNTER — Telehealth: Payer: Self-pay

## 2024-05-19 NOTE — Telephone Encounter (Signed)
Waiting for provider review.

## 2024-05-19 NOTE — Telephone Encounter (Signed)
 Copied from CRM #[id number]. Topic: Clinical - Lab/Test Results >> May 19, 2024  2:01 PM Delon DASEN wrote: Reason for CRM: results of culture of culture is back, please call in antibiotic- [phone]

## 2024-05-20 ENCOUNTER — Telehealth: Payer: Self-pay

## 2024-05-20 NOTE — Telephone Encounter (Signed)
 Copied from CRM #[id number]. Topic: Clinical - Lab/Test Results >> May 20, 2024 10:37 AM Edsel HERO wrote: Patient called to see if her urine culture results had come back yet and if Dr. Antonetta was going to send in a antibiotic for her. Please advise.

## 2024-05-20 NOTE — Telephone Encounter (Signed)
Waiting for provider review.

## 2024-05-23 ENCOUNTER — Encounter: Payer: Self-pay | Admitting: Family Medicine

## 2024-05-24 ENCOUNTER — Encounter: Payer: Self-pay | Admitting: Family Medicine

## 2024-05-24 ENCOUNTER — Telehealth: Payer: Self-pay

## 2024-05-24 MED ORDER — NITROFURANTOIN MONOHYD MACRO 100 MG PO CAPS: 100.0000 mg | ORAL_CAPSULE | Freq: Two times a day (BID) | ORAL | 0 refills | Status: DC

## 2024-05-24 MED ORDER — FLUCONAZOLE 150 MG PO TABS: 150.0000 mg | ORAL_TABLET | Freq: Once | ORAL | 0 refills | Status: AC

## 2024-05-24 NOTE — Telephone Encounter (Signed)
 Msg sent to pt regarding this detailed history needed from her

## 2024-05-24 NOTE — Telephone Encounter (Signed)
 Copied from CRM [id number]. Topic: Referral - Request for Referral >> May 24, 2024  1:08 PM Emylou G wrote: Did the patient discuss referral with their provider in the last year? Yes (If No - schedule appointment) (If Yes - send message)  Appointment offered? No  Type of order/referral and detailed reason for visit: back pain  Preference of office, provider, location: [facility]  If referral order, have you been seen by this specialty before? Yes (If Yes, this issue or another issue? When? Where?  Can we respond through MyChart? Yes

## 2024-05-31 ENCOUNTER — Encounter: Payer: Self-pay | Admitting: Family Medicine

## 2024-05-31 DIAGNOSIS — R300 Dysuria: Secondary | ICD-10-CM | POA: Insufficient documentation

## 2024-05-31 DIAGNOSIS — M05771 Rheumatoid arthritis with rheumatoid factor of right ankle and foot without organ or systems involvement: Secondary | ICD-10-CM | POA: Insufficient documentation

## 2024-05-31 NOTE — Assessment & Plan Note (Signed)
 Increeased generalized pain and debility, last evaluation over 1 year ago, intolerant of medication but needs re assesment due to symptom progression

## 2024-05-31 NOTE — Assessment & Plan Note (Signed)
 Urine to be sent for culture , if no infection will recommend urology eval

## 2024-05-31 NOTE — Assessment & Plan Note (Signed)
 Send for c/s

## 2024-05-31 NOTE — Progress Notes (Signed)
 [name]     MRN: [id number]      DOB: 1970-10-11  Chief Complaint  Patient presents with   Medical Management of Chronic Issues    4 month follow up    Urinary Frequency    Pt complains of urinary frequency and urgency x2 week. Denies burning, dysuria, or fever    Leg Pain    Pt complains of intermittent bilateral leg swelling and pain x6 month since starting a new job     HPI [name] is here for follow up and re-evaluation of chronic medical conditions, medication management and review of any available recent lab and radiology data.  Preventive health is updated, specifically  Cancer screening and Immunization.   Questions or concerns regarding consultations or procedures which the PT has had in the interim are  addressed. The PT denies any adverse reactions to current medications since the last visit.  Concerns as above Pain is generalized and debiliatating , she does have rA and has not been treated as was intolerant of meds Dose have stenosis of her lower back states some evenings at the end of her work shift she has to be assisted into her home because of back pain and spasm, no c/o numbness or incontinence of stool or urine ROS Denies recent fever or chills. Denies sinus pressure, nasal congestion, ear pain or sore throat. Denies chest congestion, productive cough or wheezing. Denies chest pains, palpitations and leg swelling Denies abdominal pain, nausea, vomiting,diarrhea or constipation.    Denies depression, anxiety or insomnia. Denies skin break down or rash.   PE  BP 135/80   Pulse 68   Resp 18   Ht 5' 7 (1.702 m)   Wt 163 lb 1.9 oz (74 kg)   SpO2 98%   BMI 25.55 kg/m   Patient alert and oriented and in no cardiopulmonary distress.  HEENT: No facial asymmetry, EOMI,     Neck supple .  Chest: Clear to auscultation bilaterally.  CVS: S1, S2 no murmurs, no S3.Regular rate.  ABD: Soft non tender.   Ext: No edema  MS: Adequate ROM spine,  shoulders, hips and knees.  Skin: Intact, no ulcerations or rash noted.  Psych: Good eye contact, normal affect. Memory intact not anxious or depressed appearing.  CNS: CN 2-12 intact, power,  normal throughout.no focal deficits noted.   Assessment & Plan  Rheumatoid arthritis involving both feet with positive rheumatoid factor (HCC) Increeased generalized pain and debility, last evaluation over 1 year ago, intolerant of medication but needs re assesment due to symptom progression  Back pain with radiation Reports increased debility, states at times at the end of a workday she xcan barely walk, will refer to Orthopedics for re eval  Dysuria Send for c/s  Urinary frequency Urine to be sent for culture , if no infection will recommend urology eval  Essential hypertension Controlled, no change in medication DASH diet and commitment to daily physical activity for a minimum of 30 minutes discussed and encouraged, as a part of hypertension management. The importance of attaining a healthy weight is also discussed.     05/11/2024    3:55 PM 12/29/2023    2:32 PM 12/29/2023    2:10 PM 10/22/2023    9:41 AM 10/16/2023    2:15 AM 10/16/2023    2:00 AM 10/16/2023    1:45 AM  BP/Weight  Systolic BP 135 130 146 135 130 108 127  Diastolic BP 80 80 82 83 82 83  79  Wt. (Lbs) 163.12  174.08 175     BMI 25.55 kg/m2  27.68 kg/m2 28.25 kg/m2

## 2024-05-31 NOTE — Assessment & Plan Note (Signed)
 Controlled, no change in medication DASH diet and commitment to daily physical activity for a minimum of 30 minutes discussed and encouraged, as a part of hypertension management. The importance of attaining a healthy weight is also discussed.     05/11/2024    3:55 PM 12/29/2023    2:32 PM 12/29/2023    2:10 PM 10/22/2023    9:41 AM 10/16/2023    2:15 AM 10/16/2023    2:00 AM 10/16/2023    1:45 AM  BP/Weight  Systolic BP 135 130 146 135 130 108 127  Diastolic BP 80 80 82 83 82 83 79  Wt. (Lbs) 163.12  174.08 175     BMI 25.55 kg/m2  27.68 kg/m2 28.25 kg/m2

## 2024-05-31 NOTE — Assessment & Plan Note (Signed)
 Reports increased debility, states at times at the end of a workday she xcan barely walk, will refer to Orthopedics for re eval

## 2024-06-09 ENCOUNTER — Ambulatory Visit: Admitting: Adult Health

## 2024-06-09 ENCOUNTER — Other Ambulatory Visit (HOSPITAL_COMMUNITY)
Admission: RE | Admit: 2024-06-09 | Discharge: 2024-06-09 | Disposition: A | Discharge status: Home / Self Care | Source: Ambulatory Visit | Attending: Adult Health | Admitting: Adult Health

## 2024-06-09 ENCOUNTER — Encounter: Payer: Self-pay | Admitting: Adult Health

## 2024-06-09 VITALS — BP 136/84 | HR 66 | Ht 66.0 in | Wt 164.0 lb

## 2024-06-09 DIAGNOSIS — Z01419 Encounter for gynecological examination (general) (routine) without abnormal findings: Secondary | ICD-10-CM | POA: Insufficient documentation

## 2024-06-09 DIAGNOSIS — E049 Nontoxic goiter, unspecified: Secondary | ICD-10-CM | POA: Insufficient documentation

## 2024-06-09 DIAGNOSIS — I10 Essential (primary) hypertension: Secondary | ICD-10-CM

## 2024-06-09 NOTE — Progress Notes (Signed)
 Patient ID: [name], female   DOB: [birth year], 53 y.o.   MRN: [id number] History of Present Illness: [name] is a 53 year old black female, divorced, [id number] in for a well woman gyn exam and pap. 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 Was treated for UTI few weeks ago.    PCP is Dr Antonetta   Current Medications, Allergies, Past Medical History, Past Surgical History, Family History and Social History were reviewed in [employer] electronic medical record.     Review of Systems: Patient denies any headaches, hearing loss, fatigue, blurred vision, shortness of breath, chest pain, abdominal pain, problems with bowel movements, urination, or intercourse. No joint pain or mood swings.  She had an ablation but still has periods    Physical Exam:BP 136/84 (BP Location: Left Arm, Patient Position: Sitting, Cuff Size: Normal)   Pulse 66   Ht 5' 6 (1.676 m)   Wt 164 lb (74.4 kg)   LMP 05/10/2024 (Approximate)   BMI 26.47 kg/m   General:  Well developed, well nourished, no acute distress Skin:  Warm and dry Neck:  Midline trachea, + thyroid  goiter, good ROM, no lymphadenopathy Lungs; Clear to auscultation bilaterally Breast:  No dominant palpable mass, retraction, or nipple discharge Cardiovascular: Regular rate and rhythm Abdomen:  Soft, non tender, no hepatosplenomegaly Pelvic:  External genitalia is normal in appearance, no lesions.  The vagina is normal in appearance. Urethra has no lesions or masses. The cervix is bulbous.Pap with GC/CHL and HR HPV genotyping performed.  Uterus is felt to be normal size, shape, and contour.  No adnexal masses or tenderness noted.Bladder is non tender, no masses felt. Rectal: Deferred  Extremities/musculoskeletal:  No swelling or varicosities noted, no clubbing or cyanosis Psych:  No mood changes, alert and cooperative,seems happy AA is 0 Fall risk is low    06/09/2024    3:58 PM 05/11/2024    3:58 PM 12/29/2023    2:11 PM  Depression  screen PHQ 2/9  Decreased Interest 0 0 0  Down, Depressed, Hopeless 0 0 0  PHQ - 2 Score 0 0 0  Altered sleeping 0    Tired, decreased energy 0    Change in appetite 0    Feeling bad or failure about yourself  0    Trouble concentrating 0    Moving slowly or fidgety/restless 0    Suicidal thoughts 0    PHQ-9 Score 0         06/09/2024    3:58 PM 05/11/2024    3:58 PM 12/29/2023    2:11 PM 10/22/2023    9:45 AM  GAD 7 : Generalized Anxiety Score  Nervous, Anxious, on Edge 0 0 0 0  Control/stop worrying 0 0 0 0  Worry too much - different things 0 0 0 0  Trouble relaxing 0 0 0 0  Restless 0 0 0 0  Easily annoyed or irritable 0 0 0 0  Afraid - awful might happen 0 0 0 0  Total GAD 7 Score 0 0 0 0  Anxiety Difficulty  Not difficult at all Not difficult at all Not difficult at all    Upstream - 06/09/24 1554       Pregnancy Intention Screening   Does the patient want to become pregnant in the next year? N/A    Does the patient's partner want to become pregnant in the next year? N/A    Would the patient like to discuss contraceptive  options today? N/A      Contraception Wrap Up   Current Method Female Sterilization    End Method Female Sterilization    Contraception Counseling Provided No           Examination chaperoned by Clarita Salt LPN   Impression and plan: 1. Encounter for gynecological examination with Papanicolaou smear of cervix (Primary) Pap sent Pap in 3 years if negative  - Cytology - PAP( [facility]) Physical in 1 year Labs with PCP Mammogram has asymmetry 09/21/23 and was normal 10/01/23 Colonoscopy per GI  2. Essential hypertension Continue BP meds and follow up with PCP  3. Thyroid  goiter

## 2024-06-16 ENCOUNTER — Ambulatory Visit: Payer: Self-pay | Admitting: Adult Health

## 2024-06-16 LAB — CYTOLOGY - PAP
Chlamydia: NEGATIVE
Comment: NEGATIVE
Comment: NEGATIVE
Comment: NORMAL
Diagnosis: NEGATIVE
High risk HPV: NEGATIVE
Neisseria Gonorrhea: NEGATIVE

## 2024-07-01 NOTE — Progress Notes (Signed)
 "  Office Visit Note  Patient: [name]             Date of Birth: [birth year]           MRN: [id number]             PCP: Antonetta Rollene BRAVO, MD Referring: Antonetta Rollene BRAVO, MD Visit Date: 07/15/2024 Occupation: Data Unavailable  Subjective:  Pain in multiple joints   History of Present Illness: [name] is a 53 y.o. female with history of seropositive rheumatoid arthritis.  Patient is not currently taking immunosuppressive agents.  Patient was last seen in the office in April 2024 by Dr. Dolphus.  Patient states that over the past 6 months she has been experiencing progressively worsening pain and stiffness affecting multiple joints.  She has noticed increased swelling in both hands and both feet.  She has been taking Tylenol  on an as needed basis and using Biofreeze for pain relief.  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    Activities of Daily Living:  Patient reports morning stiffness for 0 minute.   Patient Reports nocturnal pain.  Difficulty dressing/grooming: Denies Difficulty climbing stairs: Reports Difficulty getting out of chair: Denies Difficulty using hands for taps, buttons, cutlery, and/or writing: Reports  Review of Systems  Constitutional:  Negative for fatigue.  HENT:  Negative for mouth sores and mouth dryness.   Eyes:  Negative for dryness.  Respiratory:  Negative for shortness of breath.   Cardiovascular:  Negative for chest pain and palpitations.  Gastrointestinal:  Negative for blood in stool, constipation and diarrhea.  Endocrine: Negative for increased urination.  Genitourinary:  Negative for involuntary urination.  Musculoskeletal:  Positive for joint pain, joint pain, joint swelling, myalgias, muscle weakness, muscle tenderness and myalgias. Negative for gait problem and morning stiffness.  Skin:  Negative for color change, rash, hair loss and sensitivity to sunlight.  Allergic/Immunologic:  Negative for susceptible to infections.  Neurological:  Negative for dizziness and headaches.  Hematological:  Negative for swollen glands.  Psychiatric/Behavioral:  Negative for depressed mood and sleep disturbance. The patient is not nervous/anxious.     PMFS History:  Patient Active Problem List   Diagnosis Date Noted   Thyroid  goiter 06/09/2024   Encounter for gynecological examination with Papanicolaou smear of cervix 06/09/2024   Rheumatoid arthritis involving both feet with positive rheumatoid factor (HCC) 05/31/2024   Dysuria 05/31/2024   Well woman exam with routine gynecological exam 01/04/2024   Leg pain, diffuse, right 12/29/2023   Fibroid 10/22/2023   Hiatal hernia 10/22/2023   Exposure to COVID-19 virus 08/15/2023   Dyslipidemia 07/30/2023   Rib pain on left side 07/30/2023   Encounter for screening fecal occult blood testing 06/10/2023   Abnormal CT of the abdomen 10/31/2022   Pancreatic lesion 10/31/2022   Colon cancer screening 10/27/2022   Encounter for screening colonoscopy 10/27/2022   Torn rotator cuff 08/31/2022   Leukocytosis 07/25/2022   Chronic right shoulder pain 07/21/2022   Bilateral carpal tunnel syndrome 05/06/2022   Generalized pain 05/05/2022   Elevated rheumatoid factor 05/05/2022   Adjustment reaction with anxiety and depression 04/03/2022   Cervical radiculopathy 11/28/2021   Benign skin mole 03/07/2020   Callus of foot 03/07/2020   Chronic superficial venous thrombosis of lower extremity 06/05/2019   Allergic reaction 12/07/2018   Multiple allergies 11/28/2015   Urinary frequency 12/14/2014   Prediabetes 08/01/2012   Overweight (BMI 25.0-29.9) 08/01/2012   Vitamin  D deficiency 07/28/2012   Allergic rhinitis 10/16/2009   Neck pain 03/17/2009   HYPOKALEMIA 03/14/2008   THYROID  NODULE 10/26/2007   Essential hypertension 10/26/2007   Back pain with radiation 10/26/2007    Past Medical History:  Diagnosis Date   Back pain    BV  (bacterial vaginosis) 09/16/2013   Chest pain 2000   right    Contraceptive management 09/16/2013   Crohn disease (HCC)    Encephalocele (HCC) 1994   Enlarged thyroid     HSV-2 (herpes simplex virus 2) infection    Hypertension    Irregular intermenstrual bleeding 04/27/2015   Obesity    Osteoarthritis    Preterm labor    Rheumatoid arthritis (HCC)    Rotator cuff tear    Sleep disorder    Thyroid  nodule    Urinary frequency 12/14/2014   Vaginal discharge 09/16/2013    Family History  Problem Relation Age of Onset   Cancer Paternal Grandmother        breast   Seizures Maternal Grandmother    Cancer Father        thyroid     Hypertension Father    Anuerysm Mother    Stroke Mother    Thyroid  cancer Daughter    Past Surgical History:  Procedure Laterality Date   BIOPSY  11/21/2022   Procedure: BIOPSY;  Surgeon: Eartha Angelia Sieving, MD;  Location: [facility];  Service: Gastroenterology;;   BREAST BIOPSY  2007   btl  1994   COLONOSCOPY WITH PROPOFOL  N/A 11/21/2022   Procedure: COLONOSCOPY WITH PROPOFOL ;  Surgeon: Eartha Angelia Sieving, MD;  Location: [facility];  Service: Gastroenterology;  Laterality: N/A;  1:00PM;ASA 1-2   DILITATION & CURRETTAGE/HYSTROSCOPY WITH NOVASURE ABLATION N/A 05/06/2016   Procedure: DILATATION & CURETTAGE/HYSTEROSCOPY WITH NOVASURE ABLATION;  Surgeon: Norleen LULLA Server, MD;  Location: [facility];  Service: Gynecology;  Laterality: N/A;  length-5; width-2.6; power-72 ; time- 1 minute 42 seconds   LAPAROSCOPIC BILATERAL SALPINGECTOMY Bilateral 05/06/2016   Procedure: LAPAROSCOPIC BILATERAL SALPINGECTOMY;  Surgeon: Norleen LULLA Server, MD;  Location: [facility];  Service: Gynecology;  Laterality: Bilateral;   RETAINED PLACENTA REMOVAL     TUBAL LIGATION     04/2016   tubal ligation reversal  2001   WISDOM TOOTH EXTRACTION     Social History[1] Social History   Social History Narrative   Not on file     Immunization History  Administered  Date(s) Administered   Hepb-cpg 12/18/2022, 02/02/2023   Influenza,inj,Quad PF,6+ Mos 08/31/2014   Influenza-Unspecified 03/28/2024   PFIZER(Purple Top)SARS-COV-2 Vaccination 02/18/2020   Td 12/18/2010   Tdap 07/07/2002, 12/18/2022     Objective: Vital Signs: BP 138/84   Pulse (!) 55   Temp 98.2 F (36.8 C)   Resp 14   Ht 5' 6.5 (1.689 m)   Wt 163 lb 12.8 oz (74.3 kg)   BMI 26.04 kg/m    Physical Exam Vitals and nursing note reviewed.  Constitutional:      Appearance: She is well-developed.  HENT:     Head: Normocephalic and atraumatic.  Eyes:     Conjunctiva/sclera: Conjunctivae normal.  Cardiovascular:     Rate and Rhythm: Normal rate and regular rhythm.     Heart sounds: Normal heart sounds.  Pulmonary:     Effort: Pulmonary effort is normal.     Breath sounds: Normal breath sounds.  Abdominal:     General: Bowel sounds are normal.     Palpations: Abdomen is soft.  Musculoskeletal:  Cervical back: Normal range of motion.  Lymphadenopathy:     Cervical: No cervical adenopathy.  Skin:    General: Skin is warm and dry.     Capillary Refill: Capillary refill takes less than 2 seconds.  Neurological:     Mental Status: She is alert and oriented to person, place, and time.  Psychiatric:        Behavior: Behavior normal.      Musculoskeletal Exam: Generalized hyperalgesia and positive tender points. C-spine has painful ROM with lateral rotation.  Painful ROM of both shoulders, left worse than right. Trapezius muscle tension and tenderness bilaterally. elbow joints, wrist joints, MCPs, PIPs, DIPs have good range of motion with no synovitis. Tenderness of both wrist joints, MCPs, and PIP joints.  Complete fist formation bilaterally.  Hip joints have good range of motion with no groin pain.  Knee joints have good range of motion no warmth or effusion.  Ankle joints have good range of motion no joint swelling. Thickening and bunion formation over 1st MTP joints. Tailor  bunions noted bilaterally.    CDAI Exam: CDAI Score: -- Patient Global: --; Provider Global: -- Swollen: --; Tender: -- Joint Exam 07/15/2024   No joint exam has been documented for this visit   There is currently no information documented on the homunculus. Go to the Rheumatology activity and complete the homunculus joint exam.  Investigation: No additional findings.  Imaging: No results found.  Recent Labs: Lab Results  Component Value Date   WBC 9.3 10/22/2023   HGB 13.1 10/22/2023   PLT 409 10/22/2023   NA 141 05/11/2024   K 3.3 (L) 05/11/2024   CL 103 05/11/2024   CO2 22 05/11/2024   GLUCOSE 67 (L) 05/11/2024   BUN 15 05/11/2024   CREATININE 1.03 (H) 05/11/2024   BILITOT 0.4 05/11/2024   ALKPHOS 96 05/11/2024   AST 18 05/11/2024   ALT 14 05/11/2024   PROT 7.8 05/11/2024   ALBUMIN 4.6 05/11/2024   CALCIUM 9.5 05/11/2024   GFRAA 86 12/27/2019   QFTBGOLDPLUS Negative 11/21/2022    Speciality Comments: [facility] Eye exam 07/23/2022 Dr. Willma Moats Optometrist normal f/u 12 month  Procedures:  No procedures performed Allergies: Biaxin [clarithromycin], Iodinated contrast media, Amoxicillin -pot clavulanate, Depo-medrol  [methylprednisolone ], Shellfish allergy, Toradol  [ketorolac  tromethamine ], Yellow jacket venom [bee venom], and Peanut-containing drug products   Assessment / Plan:     Visit Diagnoses: Rheumatoid arthritis with rheumatoid factor of multiple sites without organ or systems involvement (HCC) - positive RF, negative anti-CCP, normal sed rate, ultrasound was negative for synovitis: Patient presents today with increased pain involving multiple joints.  She has progressively worsening pain and stiffness affecting both hands and both feet for the past 6 months.  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  She has been taking Tylenol  at bedtime as needed for pain relief and occasionally uses Biofreeze  topically. On examination no obvious synovitis was noted.  Plan to check sed rate and CRP today. RF, MCV, and anti-CCP will also be rechecked as requested. Discussed proceeding with an ultrasound to assess for synovitis.  Patient previously tried Plaquenil  but discontinued due to chest pain.  Treatment alternatives were briefly discussed but medication options will be discussed in more detail pending ultrasound results.  High risk medication use - She is not currently taking any immunosuppressive agents-(Plaquenil  200 mg p.o. twice daily Monday to Friday.  -Discontinued due to chest pain) eye examination July 23, 2022. Plan to check CBC and CMP today.   Nontraumatic complete tear of right rotator cuff  Primary osteoarthritis of both hands: Patient presents today for further evaluation of pain, stiffness, and swelling affecting both hands.  Her symptoms have progressively been worsening for the past 6 months.  She has tenderness of both wrist joints and across all MCP and PIP joints.  No obvious synovitis was noted.  She has been taking Tylenol  on an as-needed basis for pain relief. I did schedule ultrasound of both hands to assess for synovitis.  Plan to also obtain the following lab work.  Primary osteoarthritis of both feet: Good range of motion of both ankle joints with no synovitis.  Bunion formation over bilateral great toes and tailor bunions noted bilaterally.  Tenderness across the MTP joints but no synovitis noted. Plan to check ESR and CRP today.    Chondromalacia of both patellae: She has good range of motion of both knee joints on examination today with some discomfort and stiffness.  No effusion noted.  Cervical radiculopathy: C-spine has limited range of motion without rotation.  Myofascial pain: Patient has generalized abilities and positive tender points on exam.  She is having trapezius muscle tension and tenderness bilaterally.  She has been working a more physically demanding  job which has exacerbated her myofascial pain.  She has been taking Tylenol  and using Biofreeze as needed for pain relief.  Other medical conditions are listed as follows:   Essential hypertension: BP was 138/84 today in the office.   Prediabetes  Adjustment reaction with anxiety and depression  Vitamin D  deficiency  Orders: No orders of the defined types were placed in this encounter.  No orders of the defined types were placed in this encounter.   Follow-Up Instructions: Return in about 5 months (around 12/13/2024) for Rheumatoid arthritis.   Waddell CHRISTELLA Craze, PA-C  Note - This record has been created using Dragon software.  Chart creation errors have been sought, but may not always  have been located. Such creation errors do not reflect on  the standard of medical care.     [1]  Social History Tobacco Use   Smoking status: Never    Passive exposure: Never   Smokeless tobacco: Never  Vaping Use   Vaping status: Never Used  Substance Use Topics   Alcohol use: No   Drug use: No   "

## 2024-07-15 ENCOUNTER — Encounter: Payer: Self-pay | Admitting: Physician Assistant

## 2024-07-15 ENCOUNTER — Ambulatory Visit: Attending: Physician Assistant | Admitting: Physician Assistant

## 2024-07-15 VITALS — BP 138/84 | HR 55 | Temp 98.2°F | Resp 14 | Ht 66.5 in | Wt 163.8 lb

## 2024-07-15 DIAGNOSIS — M2242 Chondromalacia patellae, left knee: Secondary | ICD-10-CM

## 2024-07-15 DIAGNOSIS — M19042 Primary osteoarthritis, left hand: Secondary | ICD-10-CM

## 2024-07-15 DIAGNOSIS — M19072 Primary osteoarthritis, left ankle and foot: Secondary | ICD-10-CM

## 2024-07-15 DIAGNOSIS — R7303 Prediabetes: Secondary | ICD-10-CM | POA: Diagnosis not present

## 2024-07-15 DIAGNOSIS — M2241 Chondromalacia patellae, right knee: Secondary | ICD-10-CM

## 2024-07-15 DIAGNOSIS — M7918 Myalgia, other site: Secondary | ICD-10-CM | POA: Diagnosis not present

## 2024-07-15 DIAGNOSIS — M19041 Primary osteoarthritis, right hand: Secondary | ICD-10-CM

## 2024-07-15 DIAGNOSIS — M5412 Radiculopathy, cervical region: Secondary | ICD-10-CM

## 2024-07-15 DIAGNOSIS — I10 Essential (primary) hypertension: Secondary | ICD-10-CM | POA: Diagnosis not present

## 2024-07-15 DIAGNOSIS — Z79899 Other long term (current) drug therapy: Secondary | ICD-10-CM

## 2024-07-15 DIAGNOSIS — F4323 Adjustment disorder with mixed anxiety and depressed mood: Secondary | ICD-10-CM

## 2024-07-15 DIAGNOSIS — M75121 Complete rotator cuff tear or rupture of right shoulder, not specified as traumatic: Secondary | ICD-10-CM | POA: Diagnosis not present

## 2024-07-15 DIAGNOSIS — M19071 Primary osteoarthritis, right ankle and foot: Secondary | ICD-10-CM

## 2024-07-15 DIAGNOSIS — E559 Vitamin D deficiency, unspecified: Secondary | ICD-10-CM | POA: Diagnosis not present

## 2024-07-15 DIAGNOSIS — M0579 Rheumatoid arthritis with rheumatoid factor of multiple sites without organ or systems involvement: Secondary | ICD-10-CM | POA: Diagnosis not present

## 2024-07-18 ENCOUNTER — Ambulatory Visit: Payer: Self-pay | Admitting: Physician Assistant

## 2024-07-18 NOTE — Progress Notes (Signed)
 CRP is elevated. ESR WNL.  Recommend proceeding with ultrasound to assess for synovitis--please see if there is sooner availability than 09/02/24.  RF remains positive. Anti-CCP negative.  CBC and CMP stable.

## 2024-07-19 ENCOUNTER — Ambulatory Visit: Attending: Rheumatology | Admitting: Rheumatology

## 2024-07-19 ENCOUNTER — Ambulatory Visit

## 2024-07-19 DIAGNOSIS — M0579 Rheumatoid arthritis with rheumatoid factor of multiple sites without organ or systems involvement: Secondary | ICD-10-CM

## 2024-07-19 DIAGNOSIS — M79642 Pain in left hand: Secondary | ICD-10-CM

## 2024-07-19 DIAGNOSIS — M79641 Pain in right hand: Secondary | ICD-10-CM | POA: Diagnosis not present

## 2024-07-19 NOTE — Progress Notes (Signed)
 Visit diagnosis: Pain in both hands  Ultrasound examination of bilateral hands was performed per EULAR recommendations. Using 15 MHz transducer, grayscale and power Doppler bilateral second and third both dorsal and volar aspects were evaluated to look for synovitis or tenosynovitis. The findings were there was no synovitis or tenosynovitis on ultrasound examination.  Impression: The limited ultrasound examination of both hands was negative for synovitis.  Ultrasound results were discussed with the patient.  Sandra Nash, MD

## 2024-07-20 LAB — CBC WITH DIFFERENTIAL/PLATELET
Absolute Lymphocytes: 1656 {cells}/uL (ref 850–3900)
Absolute Monocytes: 690 {cells}/uL (ref 200–950)
Basophils Absolute: 74 {cells}/uL (ref 0–200)
Basophils Relative: 0.8 %
Eosinophils Absolute: 175 {cells}/uL (ref 15–500)
Eosinophils Relative: 1.9 %
HCT: 42 % (ref 35.9–46.0)
Hemoglobin: 12.7 g/dL (ref 11.7–15.5)
MCH: 22.1 pg — ABNORMAL LOW (ref 27.0–33.0)
MCHC: 30.2 g/dL — ABNORMAL LOW (ref 31.6–35.4)
MCV: 73.2 fL — ABNORMAL LOW (ref 81.4–101.7)
MPV: 12 fL (ref 7.5–12.5)
Monocytes Relative: 7.5 %
Neutro Abs: 6606 {cells}/uL (ref 1500–7800)
Neutrophils Relative %: 71.8 %
Platelets: 356 Thousand/uL (ref 140–400)
RBC: 5.74 Million/uL — ABNORMAL HIGH (ref 3.80–5.10)
RDW: 15 % (ref 11.0–15.0)
Total Lymphocyte: 18 %
WBC: 9.2 Thousand/uL (ref 3.8–10.8)

## 2024-07-20 LAB — COMPREHENSIVE METABOLIC PANEL WITH GFR
AG Ratio: 1.4 (calc) (ref 1.0–2.5)
ALT: 12 U/L (ref 6–29)
AST: 14 U/L (ref 10–35)
Albumin: 4.4 g/dL (ref 3.6–5.1)
Alkaline phosphatase (APISO): 101 U/L (ref 37–153)
BUN: 12 mg/dL (ref 7–25)
CO2: 31 mmol/L (ref 20–32)
Calcium: 9.2 mg/dL (ref 8.6–10.4)
Chloride: 100 mmol/L (ref 98–110)
Creat: 0.87 mg/dL (ref 0.50–1.03)
Globulin: 3.1 g/dL (ref 1.9–3.7)
Glucose, Bld: 73 mg/dL (ref 65–99)
Potassium: 3.2 mmol/L — ABNORMAL LOW (ref 3.5–5.3)
Sodium: 140 mmol/L (ref 135–146)
Total Bilirubin: 0.6 mg/dL (ref 0.2–1.2)
Total Protein: 7.5 g/dL (ref 6.1–8.1)
eGFR: 80 mL/min/1.73m2

## 2024-07-20 LAB — CYCLIC CITRUL PEPTIDE ANTIBODY, IGG: Cyclic Citrullin Peptide Ab: <16 U

## 2024-07-20 LAB — RHEUMATOID FACTOR: Rheumatoid fact SerPl-aCnc: 164 [IU]/mL — ABNORMAL HIGH

## 2024-07-20 LAB — SEDIMENTATION RATE: Sed Rate: 6 mm/h (ref 0–30)

## 2024-07-20 LAB — C-REACTIVE PROTEIN: CRP: 8.4 mg/L — ABNORMAL HIGH

## 2024-07-20 LAB — MUTATED CITRULLINATED VIMENTIN (MCV) ANTIBODY: MUTATED CITRULLINATED VIMENTIN (MCV) AB: <20 U/mL

## 2024-07-21 NOTE — Progress Notes (Signed)
 MCV negative

## 2024-08-10 ENCOUNTER — Other Ambulatory Visit: Payer: Self-pay | Admitting: Family Medicine

## 2024-09-02 ENCOUNTER — Ambulatory Visit: Admitting: Rheumatology

## 2024-09-23 ENCOUNTER — Ambulatory Visit (HOSPITAL_COMMUNITY)

## 2024-10-11 ENCOUNTER — Ambulatory Visit: Admitting: Family Medicine

## 2024-12-16 ENCOUNTER — Ambulatory Visit: Admitting: Physician Assistant
